# Patient Record
Sex: Male | Born: 1950 | Race: White | Hispanic: No | Marital: Married | Smoking: Never smoker
Health system: Southern US, Community
[De-identification: ages and names within clinical notes are randomized; demographics above are authoritative.]

## PROBLEM LIST (undated history)

## (undated) DIAGNOSIS — I2510 Atherosclerotic heart disease of native coronary artery without angina pectoris: Secondary | ICD-10-CM

## (undated) DIAGNOSIS — L97509 Non-pressure chronic ulcer of other part of unspecified foot with unspecified severity: Secondary | ICD-10-CM

## (undated) DIAGNOSIS — Z87898 Personal history of other specified conditions: Secondary | ICD-10-CM

## (undated) DIAGNOSIS — K219 Gastro-esophageal reflux disease without esophagitis: Secondary | ICD-10-CM

## (undated) DIAGNOSIS — E785 Hyperlipidemia, unspecified: Secondary | ICD-10-CM

## (undated) DIAGNOSIS — Z973 Presence of spectacles and contact lenses: Secondary | ICD-10-CM

## (undated) DIAGNOSIS — M5126 Other intervertebral disc displacement, lumbar region: Secondary | ICD-10-CM

## (undated) DIAGNOSIS — M47816 Spondylosis without myelopathy or radiculopathy, lumbar region: Secondary | ICD-10-CM

## (undated) DIAGNOSIS — I10 Essential (primary) hypertension: Secondary | ICD-10-CM

## (undated) DIAGNOSIS — G709 Myoneural disorder, unspecified: Secondary | ICD-10-CM

## (undated) HISTORY — PX: TONSILLECTOMY: SUR1361

## (undated) HISTORY — PX: BACK SURGERY: SHX140

## (undated) HISTORY — DX: Spondylosis without myelopathy or radiculopathy, lumbar region: M47.816

## (undated) HISTORY — DX: Hyperlipidemia, unspecified: E78.5

## (undated) HISTORY — DX: Morbid (severe) obesity due to excess calories: E66.01

## (undated) HISTORY — DX: Personal history of other specified conditions: Z87.898

## (undated) HISTORY — PX: CARDIAC CATHETERIZATION: SHX172

## (undated) HISTORY — PX: COLONOSCOPY: SHX174

## (undated) HISTORY — PX: FOOT SURGERY: SHX648

## (undated) HISTORY — PX: LUMBAR LAMINECTOMY: SHX95

## (undated) HISTORY — PX: APPENDECTOMY: SHX54

## (undated) HISTORY — DX: Atherosclerotic heart disease of native coronary artery without angina pectoris: I25.10

---

## 1997-06-19 ENCOUNTER — Ambulatory Visit (HOSPITAL_COMMUNITY): Admission: RE | Admit: 1997-06-19 | Discharge: 1997-06-19 | Payer: Self-pay | Admitting: *Deleted

## 1997-07-25 ENCOUNTER — Observation Stay (HOSPITAL_COMMUNITY): Admission: RE | Admit: 1997-07-25 | Discharge: 1997-07-26 | Payer: Self-pay | Admitting: *Deleted

## 1997-12-04 ENCOUNTER — Ambulatory Visit (HOSPITAL_BASED_OUTPATIENT_CLINIC_OR_DEPARTMENT_OTHER): Admission: RE | Admit: 1997-12-04 | Discharge: 1997-12-04 | Payer: Self-pay | Admitting: *Deleted

## 1998-01-04 ENCOUNTER — Ambulatory Visit (HOSPITAL_COMMUNITY): Admission: RE | Admit: 1998-01-04 | Discharge: 1998-01-04 | Payer: Self-pay | Admitting: Gastroenterology

## 2002-05-14 DIAGNOSIS — Z955 Presence of coronary angioplasty implant and graft: Secondary | ICD-10-CM

## 2002-05-14 HISTORY — DX: Presence of coronary angioplasty implant and graft: Z95.5

## 2002-06-06 ENCOUNTER — Encounter: Payer: Self-pay | Admitting: Psychiatry

## 2002-06-06 ENCOUNTER — Ambulatory Visit (HOSPITAL_COMMUNITY): Admission: RE | Admit: 2002-06-06 | Discharge: 2002-06-06 | Payer: Self-pay | Admitting: Psychiatry

## 2004-01-22 ENCOUNTER — Ambulatory Visit (HOSPITAL_COMMUNITY): Admission: RE | Admit: 2004-01-22 | Discharge: 2004-01-22 | Payer: Self-pay | Admitting: Orthopedic Surgery

## 2004-01-22 ENCOUNTER — Ambulatory Visit (HOSPITAL_BASED_OUTPATIENT_CLINIC_OR_DEPARTMENT_OTHER): Admission: RE | Admit: 2004-01-22 | Discharge: 2004-01-22 | Payer: Self-pay | Admitting: Orthopedic Surgery

## 2007-11-22 ENCOUNTER — Ambulatory Visit (HOSPITAL_COMMUNITY): Admission: RE | Admit: 2007-11-22 | Discharge: 2007-11-22 | Payer: Self-pay | Admitting: Neurosurgery

## 2007-12-03 ENCOUNTER — Inpatient Hospital Stay (HOSPITAL_COMMUNITY): Admission: RE | Admit: 2007-12-03 | Discharge: 2007-12-08 | Payer: Self-pay | Admitting: Neurosurgery

## 2008-02-15 ENCOUNTER — Encounter: Admission: RE | Admit: 2008-02-15 | Discharge: 2008-02-15 | Payer: Self-pay | Admitting: Neurosurgery

## 2008-03-21 ENCOUNTER — Ambulatory Visit (HOSPITAL_COMMUNITY): Admission: RE | Admit: 2008-03-21 | Discharge: 2008-03-21 | Payer: Self-pay | Admitting: Neurosurgery

## 2009-11-29 ENCOUNTER — Ambulatory Visit (HOSPITAL_COMMUNITY): Admission: RE | Admit: 2009-11-29 | Discharge: 2009-11-29 | Payer: Self-pay | Admitting: Neurosurgery

## 2009-12-21 ENCOUNTER — Inpatient Hospital Stay: Admission: RE | Admit: 2009-12-21 | Payer: Self-pay | Source: Home / Self Care | Admitting: Neurosurgery

## 2010-04-30 ENCOUNTER — Ambulatory Visit (HOSPITAL_COMMUNITY)
Admission: RE | Admit: 2010-04-30 | Discharge: 2010-04-30 | Disposition: A | Discharge status: Home / Self Care | Payer: BLUE CROSS/BLUE SHIELD | Source: Ambulatory Visit | Attending: Neurosurgery | Admitting: Neurosurgery

## 2010-04-30 ENCOUNTER — Other Ambulatory Visit (HOSPITAL_COMMUNITY): Payer: Self-pay | Admitting: Neurosurgery

## 2010-04-30 ENCOUNTER — Encounter (HOSPITAL_COMMUNITY)
Admission: RE | Admit: 2010-04-30 | Discharge: 2010-04-30 | Disposition: A | Discharge status: Home / Self Care | Payer: BLUE CROSS/BLUE SHIELD | Source: Ambulatory Visit | Attending: Neurosurgery | Admitting: Neurosurgery

## 2010-04-30 DIAGNOSIS — Z01810 Encounter for preprocedural cardiovascular examination: Secondary | ICD-10-CM | POA: Insufficient documentation

## 2010-04-30 DIAGNOSIS — IMO0002 Reserved for concepts with insufficient information to code with codable children: Secondary | ICD-10-CM | POA: Insufficient documentation

## 2010-04-30 DIAGNOSIS — M48061 Spinal stenosis, lumbar region without neurogenic claudication: Secondary | ICD-10-CM | POA: Insufficient documentation

## 2010-04-30 DIAGNOSIS — M5416 Radiculopathy, lumbar region: Secondary | ICD-10-CM

## 2010-04-30 DIAGNOSIS — Z01812 Encounter for preprocedural laboratory examination: Secondary | ICD-10-CM | POA: Insufficient documentation

## 2010-04-30 DIAGNOSIS — Z01818 Encounter for other preprocedural examination: Secondary | ICD-10-CM | POA: Insufficient documentation

## 2010-04-30 LAB — CBC
HCT: 40.2 % (ref 39.0–52.0)
Hemoglobin: 14.1 g/dL (ref 13.0–17.0)
MCH: 32.4 pg (ref 26.0–34.0)
MCHC: 35.1 g/dL (ref 30.0–36.0)
MCV: 92.4 fL (ref 78.0–100.0)
Platelets: 193 10*3/uL (ref 150–400)
RBC: 4.35 MIL/uL (ref 4.22–5.81)
RDW: 14.2 % (ref 11.5–15.5)
WBC: 6.8 10*3/uL (ref 4.0–10.5)

## 2010-04-30 LAB — SURGICAL PCR SCREEN
MRSA, PCR: NEGATIVE
Staphylococcus aureus: NEGATIVE

## 2010-04-30 LAB — BASIC METABOLIC PANEL
BUN: 9 mg/dL (ref 6–23)
CO2: 26 mEq/L (ref 19–32)
Calcium: 9.1 mg/dL (ref 8.4–10.5)
Chloride: 101 mEq/L (ref 96–112)
Creatinine, Ser: 0.86 mg/dL (ref 0.4–1.5)
GFR calc Af Amer: >60 mL/min (ref >60)
GFR calc non Af Amer: >60 mL/min (ref >60)
Glucose, Bld: 116 mg/dL — ABNORMAL HIGH (ref 70–99)
Potassium: 4 mEq/L (ref 3.5–5.1)
Sodium: 138 mEq/L (ref 135–145)

## 2010-05-08 ENCOUNTER — Inpatient Hospital Stay (HOSPITAL_COMMUNITY): Payer: BLUE CROSS/BLUE SHIELD

## 2010-05-08 ENCOUNTER — Inpatient Hospital Stay (HOSPITAL_COMMUNITY)
Admission: RE | Admit: 2010-05-08 | Discharge: 2010-05-13 | DRG: 756 | Disposition: A | Discharge status: Home / Self Care | Payer: BLUE CROSS/BLUE SHIELD | Source: Ambulatory Visit | Attending: Neurosurgery | Admitting: Neurosurgery

## 2010-05-08 DIAGNOSIS — M5137 Other intervertebral disc degeneration, lumbosacral region: Principal | ICD-10-CM | POA: Diagnosis present

## 2010-05-08 DIAGNOSIS — Z981 Arthrodesis status: Secondary | ICD-10-CM

## 2010-05-08 DIAGNOSIS — I10 Essential (primary) hypertension: Secondary | ICD-10-CM | POA: Diagnosis present

## 2010-05-08 DIAGNOSIS — M51379 Other intervertebral disc degeneration, lumbosacral region without mention of lumbar back pain or lower extremity pain: Principal | ICD-10-CM | POA: Diagnosis present

## 2010-05-08 DIAGNOSIS — E669 Obesity, unspecified: Secondary | ICD-10-CM | POA: Diagnosis present

## 2010-05-08 DIAGNOSIS — Y9210 Unspecified residential institution as the place of occurrence of the external cause: Secondary | ICD-10-CM | POA: Diagnosis not present

## 2010-05-08 DIAGNOSIS — I2510 Atherosclerotic heart disease of native coronary artery without angina pectoris: Secondary | ICD-10-CM | POA: Diagnosis present

## 2010-05-08 DIAGNOSIS — L2989 Other pruritus: Secondary | ICD-10-CM | POA: Diagnosis not present

## 2010-05-08 DIAGNOSIS — Z91040 Latex allergy status: Secondary | ICD-10-CM

## 2010-05-08 DIAGNOSIS — T40605A Adverse effect of unspecified narcotics, initial encounter: Secondary | ICD-10-CM | POA: Diagnosis not present

## 2010-05-08 DIAGNOSIS — M47817 Spondylosis without myelopathy or radiculopathy, lumbosacral region: Secondary | ICD-10-CM | POA: Diagnosis present

## 2010-05-08 DIAGNOSIS — L298 Other pruritus: Secondary | ICD-10-CM | POA: Diagnosis not present

## 2010-05-08 DIAGNOSIS — Z9861 Coronary angioplasty status: Secondary | ICD-10-CM

## 2010-05-08 LAB — TYPE AND SCREEN
ABO/RH(D): O POS
Antibody Screen: NEGATIVE

## 2010-05-13 LAB — POCT I-STAT 4, (NA,K, GLUC, HGB,HCT)
Glucose, Bld: 131 mg/dL — ABNORMAL HIGH (ref 70–99)
HCT: 28 % — ABNORMAL LOW (ref 39.0–52.0)
Hemoglobin: 9.5 g/dL — ABNORMAL LOW (ref 13.0–17.0)
Potassium: 4.5 mEq/L (ref 3.5–5.1)
Sodium: 137 mEq/L (ref 135–145)

## 2010-05-28 NOTE — Op Note (Signed)
NAME:  Stephen Medina, Stephen Medina NO.:  [id number]   MEDICAL RECORD NO.:  [id number]          PATIENT TYPE:  INP   LOCATION:  3023                         FACILITY:  [facility]   PHYSICIAN:  Coletta Memos, M.D.     DATE OF BIRTH:  [birth year]   DATE OF PROCEDURE:  12/03/2007  DATE OF DISCHARGE:                               OPERATIVE REPORT   PREOPERATIVE DIAGNOSES:  Lumbar stenosis L2-3, lumbar retrolisthesis L2  and L3.   POSTOPERATIVE DIAGNOSES:  Lumbar stenosis L2-3, lumbar retrolisthesis L2  on L3, lumbar instability L2-3.   PROCEDURE:  1. L2-3 posterior lumbar interbody arthrodesis with [employer] Opal cages      12 x 24 mm packed with morselized autograft, disk space was also      packed with morselized autograft.  2. Posterolateral arthrodesis L2-3 using morselized allograft.  3. Nonsegmental pedicle screw fixation of L2-3.   SURGEON:  Coletta Memos, MD   ASSISTANT:  Hilda Lias, MD   COMPLICATIONS:  None.   ANESTHESIA:  General endotracheal.   INDICATIONS:  [name] is a 60 year old who had severe pain going into  his right lower extremity.  MRI suggested a herniated disk on the right-  sided L3-4.  We had had injected but he showed absolutely no  improvement.  I had him undergo myelogram and post myelogram CT.  What  that showed was fairly significant stenosis at L2-3 and retrolisthesis.  What I was able appreciate intraoperatively was the fact that in a  supine position on a Jackson table he canted and angulated at that L2-3  disk space more than he had when he was in a recumbent position.  In a  prone position, he was different than supine.  I therefore felt it best  that he be fuse as I plan to do with the use of pedicle screws.  Since  he had abnormal movement, I did not believe this five plate would be  adequate in that situation.   OPERATIVE NOTE:  [name] was brought to the operating room, intubated,  placed under general anesthetic.  A Foley catheter  was placed under  sterile conditions.  He was positioned on the Scott table and all  pressure points were properly padded.  His back was prepped and he was  draped in a sterile fashion.  I opened the skin with a #10 blade, took  this down to the thoracolumbar fascia.  I exposed the lamina of L2 and  of L3 using intraoperative x-ray to confirm our location.  I then  proceeded to perform semihemilaminectomy on the right side when I  reassessed my position and reviewed his x-rays and felt that a full  decompression would best serve him.  I therefore completed a full  laminectomy of L2 and hemilaminectomy of L3.  Fully decompressed the  thecal sac in L2 and L3 nerve roots.   I performed diskectomies and prepared for the interbody arthrodesis.  I  used 12-mm PEEK cages filled with morselized autograft after  diskectomies were done and the endplates scraped and the bone prepared  to  accept graft material.  I packed around the cages with morselized  autograft also.  X-ray showed those cages to be in good position.  I  then turned my attention to the pedicle screws.   Using fluoroscopic guidance and with Dr. Cassandria Santee assistance we placed 4  pedicle screws two at L2, two at L3, 55 x 50 mm screw placed on the left  L2, 55 x 45 right L2, 65 x 50 L3 bilaterally.  AP and lateral views  showed screws to be in good position.  I then performed a posterolateral  arthrodesis.  I decorticated transverse process and pars  interarticularis of L2 and facet of L3.  I packed morselized allograft  around those decorticated bony surfaces bilaterally.  I then placed  rods, connected the caps, secured those.  These were legacy screws from  [employer].  I then irrigated.  I then closed the wound in layered  fashion with Dr. Cassandria Santee assistance, reapproximating thoracolumbar  subcutaneous and subcuticular layers.  Dermabond used for sterile  dressing.           ______________________________  Coletta Memos,  M.D.     KC/MEDQ  D:  12/04/2007  T:  12/04/2007  Job:  [id number]

## 2010-05-31 NOTE — Op Note (Signed)
NAME:  CHIRAG, KRUEGER NO.:  [id number]   MEDICAL RECORD NO.:  [id number]          PATIENT TYPE:  AMB   LOCATION:  N[facility]                         FACILITY:  [facility]   PHYSICIAN:  Georges Lynch. Gioffre, M.D.DATE OF BIRTH:  12-22-1950   DATE OF PROCEDURE:  01/22/2004  DATE OF DISCHARGE:                                 OPERATIVE REPORT   ASSISTANT:  Nurse.   PREOPERATIVE DIAGNOSES:  1.  Torn medial and lateral menisci, right knee.  2.  Degenerative arthritis, right knee.   POSTOPERATIVE DIAGNOSES:  1.  Torn medial and lateral menisci, right knee.  2.  Degenerative arthritis, right knee.   OPERATION:  1.  Diagnostic laparoscopy, right knee.  2.  Partial medial meniscectomy, right knee.  3.  Partial lateral meniscectomy, right knee.  4.  Abrasion chondroplasty of the patella and synovectomy in the      suprapatellar pouch.   PROCEDURE:  Under general anesthesia, routine orthopedic prepping and  draping of the right lower extremity was carried out.  He had 1 g of IV  Ancef preop.  At this time, a small punctate incision was made in the  suprapatellar pouch and inflow cannula was inserted and the knee was  distended with saline.  Another small punctate incision made in the anterior  lateral joint.  The arthroscope was entered and a complete diagnostic  arthroscopy is carried out.  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  I  introduced a shaver suction device through the medial approach and did a  synovectomy and abrasion chondroplasty in the suprapatellar region and the  patella.  Following this, I went down.  The anterior cruciate appeared to be  intact but it was somewhat attenuated.  In the medial meniscus, there was a  large flap tear anteriorly.  I simply shaved it out with a shaver suction  device.  The posterior horn was intact.  I then went over the lateral joint.  He had some  arthritic changes in the tibial plateau on the right.  That was  all laterally.  He had irregular tears of the periphery of the lateral  meniscus.  I simply did a partial lateral meniscectomy.  I probed the  remaining part of the meniscus; it was intact.  The suprapatellar region had  a chronic synovitis.  I did a synovectomy.  The patella was quite irregular;  so I introduced a shaver suction device and did an abrasion chondroplasty of  the patella.  I thoroughly irrigated out the area, removed all of the fluid.  Three punctate incisions were closed with 3-0 nylon suture.  I injected 20  cc of 0.5% Marcaine and epinephrine into the knee joint.  Sterile Neosporin  vinyl dressing was applied.   POSTOPERATIVE CARE:  1.  He will be on Mepergan Fortis 1 or 2 q.4h. p.r.n. for pain.  2.  He will be on aspirin 325 mg b.i.d. today and for 2 weeks as an      anticoagulant.  3.  He will be on crutches for weightbearing as tolerated.  4.  I will see him in the office in 2 weeks or prior to that if there is a      problem.      RAG/MEDQ  D:  01/22/2004  T:  01/22/2004  Job:  [id number]
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
## 2010-09-27 ENCOUNTER — Other Ambulatory Visit (HOSPITAL_COMMUNITY): Payer: Self-pay | Admitting: Family Medicine

## 2010-09-27 DIAGNOSIS — N63 Unspecified lump in unspecified breast: Secondary | ICD-10-CM

## 2010-10-15 LAB — BASIC METABOLIC PANEL
BUN: 9
CO2: 21
Calcium: 9
Chloride: 109
Creatinine, Ser: 0.84
GFR calc Af Amer: >60
GFR calc non Af Amer: >60
Glucose, Bld: 91
Potassium: 3.4 — ABNORMAL LOW
Sodium: 142

## 2010-10-15 LAB — CARDIAC PANEL(CRET KIN+CKTOT+MB+TROPI)
CK, MB: 2.2
CK, MB: 2.6
CK, MB: 4.7 — ABNORMAL HIGH
Relative Index: 0.4
Relative Index: 0.4
Relative Index: 0.7
Total CK: 536 — ABNORMAL HIGH
Total CK: 582 — ABNORMAL HIGH
Total CK: 695 — ABNORMAL HIGH
Troponin I: 0.01
Troponin I: 0.01
Troponin I: <0.01

## 2010-10-15 LAB — CBC
HCT: 37.5 — ABNORMAL LOW
Hemoglobin: 13
MCHC: 34.7
MCV: 95
Platelets: 219
RBC: 3.95 — ABNORMAL LOW
RDW: 15.1
WBC: 6.9

## 2010-10-15 LAB — TYPE AND SCREEN
ABO/RH(D): O POS
Antibody Screen: NEGATIVE

## 2010-10-15 LAB — APTT: aPTT: 21 — ABNORMAL LOW

## 2010-10-15 LAB — PROTIME-INR
INR: 0.9
Prothrombin Time: 12.2

## 2010-10-15 LAB — ABO/RH: ABO/RH(D): O POS

## 2010-10-16 ENCOUNTER — Ambulatory Visit (HOSPITAL_COMMUNITY)
Admission: RE | Admit: 2010-10-16 | Discharge: 2010-10-16 | Disposition: A | Discharge status: Home / Self Care | Payer: BC Managed Care – PPO | Source: Ambulatory Visit | Attending: Family Medicine | Admitting: Family Medicine

## 2010-10-16 DIAGNOSIS — N644 Mastodynia: Secondary | ICD-10-CM | POA: Insufficient documentation

## 2010-10-16 DIAGNOSIS — N63 Unspecified lump in unspecified breast: Secondary | ICD-10-CM

## 2011-11-18 ENCOUNTER — Other Ambulatory Visit: Payer: Self-pay | Admitting: Cardiology

## 2011-11-18 ENCOUNTER — Ambulatory Visit
Admission: RE | Admit: 2011-11-18 | Discharge: 2011-11-18 | Disposition: A | Discharge status: Home / Self Care | Payer: BC Managed Care – PPO | Source: Ambulatory Visit | Attending: Cardiology | Admitting: Cardiology

## 2011-11-18 DIAGNOSIS — R0789 Other chest pain: Secondary | ICD-10-CM

## 2013-01-18 DIAGNOSIS — M47817 Spondylosis without myelopathy or radiculopathy, lumbosacral region: Secondary | ICD-10-CM | POA: Diagnosis present

## 2013-01-18 HISTORY — DX: Spondylosis without myelopathy or radiculopathy, lumbosacral region: M47.817

## 2013-01-19 ENCOUNTER — Other Ambulatory Visit (HOSPITAL_COMMUNITY): Payer: Self-pay | Admitting: Neurosurgery

## 2013-01-19 ENCOUNTER — Other Ambulatory Visit: Payer: Self-pay | Admitting: Neurosurgery

## 2013-01-19 DIAGNOSIS — M4306 Spondylolysis, lumbar region: Secondary | ICD-10-CM

## 2013-01-27 ENCOUNTER — Ambulatory Visit (HOSPITAL_COMMUNITY): Admission: RE | Admit: 2013-01-27 | Payer: BC Managed Care – PPO | Source: Ambulatory Visit

## 2013-01-27 ENCOUNTER — Ambulatory Visit (HOSPITAL_COMMUNITY): Payer: BC Managed Care – PPO

## 2013-02-02 ENCOUNTER — Encounter (HOSPITAL_COMMUNITY): Payer: Self-pay | Admitting: Pharmacy Technician

## 2013-02-08 ENCOUNTER — Ambulatory Visit (HOSPITAL_COMMUNITY)
Admission: RE | Admit: 2013-02-08 | Discharge: 2013-02-08 | Disposition: A | Discharge status: Home / Self Care | Payer: BC Managed Care – PPO | Source: Ambulatory Visit | Attending: Neurosurgery | Admitting: Neurosurgery

## 2013-02-08 VITALS — BP 109/59 | HR 63 | Temp 97.7°F | Resp 16 | Ht 73.0 in | Wt 310.0 lb

## 2013-02-08 DIAGNOSIS — M47817 Spondylosis without myelopathy or radiculopathy, lumbosacral region: Secondary | ICD-10-CM | POA: Insufficient documentation

## 2013-02-08 DIAGNOSIS — M4306 Spondylolysis, lumbar region: Secondary | ICD-10-CM

## 2013-02-08 DIAGNOSIS — Z981 Arthrodesis status: Secondary | ICD-10-CM | POA: Insufficient documentation

## 2013-02-08 DIAGNOSIS — R109 Unspecified abdominal pain: Secondary | ICD-10-CM | POA: Insufficient documentation

## 2013-02-08 MED ORDER — DIAZEPAM 5 MG PO TABS
ORAL_TABLET | ORAL | Status: AC
  Filled 2013-02-08: qty 2

## 2013-02-08 MED ORDER — IOHEXOL 180 MG/ML  SOLN
20.0000 mL | Freq: Once | INTRAMUSCULAR | Status: AC | PRN
  Administered 2013-02-08: 20 mL via INTRATHECAL

## 2013-02-08 MED ORDER — OXYCODONE HCL 5 MG PO TABS
ORAL_TABLET | ORAL | Status: AC
  Administered 2013-02-08: 5 mg via ORAL
  Filled 2013-02-08: qty 2

## 2013-02-08 MED ORDER — ONDANSETRON HCL 4 MG/2ML IJ SOLN: 4.0000 mg | Freq: Four times a day (QID) | INTRAMUSCULAR | Status: DC | PRN

## 2013-02-08 MED ORDER — OXYCODONE HCL 5 MG PO TABS
5.0000 mg | ORAL_TABLET | ORAL | Status: DC | PRN
  Administered 2013-02-08: 10 mg via ORAL
  Administered 2013-02-08: 5 mg via ORAL
  Filled 2013-02-08: qty 1

## 2013-02-08 MED ORDER — DIAZEPAM 5 MG PO TABS
10.0000 mg | ORAL_TABLET | Freq: Once | ORAL | Status: AC
  Administered 2013-02-08: 10 mg via ORAL

## 2013-02-08 NOTE — Discharge Instructions (Signed)
Myelography °Care After °These instructions give you information on caring for yourself after your procedure. Your doctor may also give you specific instructions. Call your doctor if you have any problems or questions after your procedure. °HOME CARE °· Rest often the first day. °· When you rest, lie flat, with your head slightly raised (elevated). °· Avoid heavy lifting and activity for 48 hours. °· You may take the bandage (dressing) off 1 day after the test. °GET HELP RIGHT AWAY IF:  °· You have a very bad headache. °· You have a fever. °MAKE SURE YOU: °· Understand these instructions. °· Will watch your condition. °· Will get help right away if you are not doing well or get worse. °Document Released: 10/09/2007 Document Revised: 12/17/2011 Document Reviewed: 09/24/2011 °ExitCare® Patient Information ©2014 [employer]. ° °

## 2013-02-08 NOTE — Op Note (Signed)
*   No surgery found * Lumbar Myelogram  PATIENT:  Stephen Medina is a 63 y.o. male with low back and lower extremity pain  PRE-OPERATIVE DIAGNOSIS:  Lumbago, lumbar radiculopathy  POST-OPERATIVE DIAGNOSIS:  same  PROCEDURE:  Lumbar Myelogram  SURGEON:  Surie Suchocki  ANESTHESIA:   local LOCAL MEDICATIONS USED:  LIDOCAINE  and Amount: 15 ml Procedure Note: Stephen Medina was brought to the myelogram suite. His back was prepped in a sterile fashion. I infiltrated lidocaine into the entry site. I placed a needle into the thecal sac, then infiltrated 20 cc 180 omnipaque into the thecal sac. I confirmed my injection with fluoroscopy. This was done at L2/3.Stephen Medina tolerated the procedure well.    PATIENT DISPOSITION:  Short Stay

## 2013-03-17 DIAGNOSIS — M48061 Spinal stenosis, lumbar region without neurogenic claudication: Secondary | ICD-10-CM | POA: Insufficient documentation

## 2013-03-17 HISTORY — DX: Spinal stenosis, lumbar region without neurogenic claudication: M48.061

## 2013-03-22 ENCOUNTER — Other Ambulatory Visit: Payer: Self-pay | Admitting: Neurosurgery

## 2013-03-23 ENCOUNTER — Encounter (HOSPITAL_COMMUNITY): Payer: Self-pay | Admitting: Pharmacy Technician

## 2013-03-25 ENCOUNTER — Encounter: Payer: Self-pay | Admitting: Cardiology
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
## 2013-03-26 NOTE — Pre-Procedure Instructions (Signed)
Stephen Medina  03/26/2013   Your procedure is scheduled on:  March 20  Report to [facility] Entrance "A" [address] at [employer] AM.  Call this number if you have problems the morning of surgery: [phone]   Remember:   Do not eat food or drink liquids after midnight.   Take these medicines the morning of surgery with A SIP OF WATER: Metoprolol   STOP Aspirin and Plavix today   STOP/ Do not take Aspirin, Aleve, Naproxen, Advil, Ibuprofen, Vitamin, Herbs, or Supplements starting today   Do not wear jewelry.  Do not wear lotions, powders, or cologne. You may not wear deodorant.  Do not shave 48 hours prior to surgery. Men may shave face and neck.  Do not bring valuables to the hospital.  [facility] is not responsible for any belongings or valuables.               Contacts, dentures or bridgework may not be worn into surgery.  Leave suitcase in the car. After surgery it may be brought to your room.  For patients admitted to the hospital, discharge time is determined by your treatment team.               Special Instructions: See [facility] Preparing For Surgery   Please read over the following fact sheets that you were given: Pain Booklet, Coughing and Deep Breathing, Blood Transfusion Information and Surgical Site Infection Prevention

## 2013-03-26 NOTE — Pre-Procedure Instructions (Signed)
Stephen Medina - Preparing for Surgery  Before surgery, you can play an important role.  Because skin is not sterile, your skin needs to be as free of germs as possible.  You can reduce the number of germs on you skin by washing with CHG (chlorahexidine gluconate) soap before surgery.  CHG is an antiseptic cleaner which kills germs and bonds with the skin to continue killing germs even after washing.  Please DO NOT use if you have an allergy to CHG or antibacterial soaps.  If your skin becomes reddened/irritated stop using the CHG and inform your nurse when you arrive at Short Stay.  Do not shave (including legs and underarms) for at least 48 hours prior to the first CHG shower.  You may shave your face.  Please follow these instructions carefully:   1.  Shower with CHG Soap the night before surgery and the morning of Surgery.  2.  If you choose to wash your hair, wash your hair first as usual with your normal shampoo.  3.  After you shampoo, rinse your hair and body thoroughly to remove the shampoo.  4.  Use CHG as you would any other liquid soap.  You can apply CHG directly to the skin and wash gently with scrungie or a clean washcloth.  5.  Apply the CHG Soap to your body ONLY FROM THE NECK DOWN.  Do not use on open wounds or open sores.  Avoid contact with your eyes, ears, mouth and genitals (private parts).  Wash genitals (private parts) with your normal soap.  6.  Wash thoroughly, paying special attention to the area where your surgery will be performed.  7.  Thoroughly rinse your body with warm water from the neck down.  8.  DO NOT shower/wash with your normal soap after using and rinsing off the CHG Soap.  9.  Pat yourself dry with a clean towel.            10.  Wear clean pajamas.            11.  Place clean sheets on your bed the night of your first shower and do not sleep with pets.  Day of Surgery  Do not apply any lotions the morning of surgery.  Please wear clean clothes to the  hospital/surgery center.   

## 2013-03-28 ENCOUNTER — Encounter (HOSPITAL_COMMUNITY)
Admission: RE | Admit: 2013-03-28 | Discharge: 2013-03-28 | Disposition: A | Discharge status: Home / Self Care | Payer: Medicare Other | Source: Ambulatory Visit | Attending: Neurosurgery | Admitting: Neurosurgery

## 2013-03-28 ENCOUNTER — Ambulatory Visit (HOSPITAL_COMMUNITY)
Admission: RE | Admit: 2013-03-28 | Discharge: 2013-03-28 | Disposition: A | Discharge status: Home / Self Care | Payer: Medicare Other | Source: Ambulatory Visit | Attending: Neurosurgery | Admitting: Neurosurgery

## 2013-03-28 ENCOUNTER — Encounter (HOSPITAL_COMMUNITY): Payer: Self-pay

## 2013-03-28 DIAGNOSIS — Z01810 Encounter for preprocedural cardiovascular examination: Secondary | ICD-10-CM | POA: Insufficient documentation

## 2013-03-28 DIAGNOSIS — Z01818 Encounter for other preprocedural examination: Secondary | ICD-10-CM | POA: Insufficient documentation

## 2013-03-28 DIAGNOSIS — Z01812 Encounter for preprocedural laboratory examination: Secondary | ICD-10-CM | POA: Insufficient documentation

## 2013-03-28 HISTORY — DX: Essential (primary) hypertension: I10

## 2013-03-28 LAB — BASIC METABOLIC PANEL
BUN: 11 mg/dL (ref 6–23)
CO2: 25 mEq/L (ref 19–32)
Calcium: 9.3 mg/dL (ref 8.4–10.5)
Chloride: 99 mEq/L (ref 96–112)
Creatinine, Ser: 0.77 mg/dL (ref 0.50–1.35)
GFR calc Af Amer: >90 mL/min (ref >90)
GFR calc non Af Amer: >90 mL/min (ref >90)
Glucose, Bld: 108 mg/dL — ABNORMAL HIGH (ref 70–99)
Potassium: 4.1 mEq/L (ref 3.7–5.3)
Sodium: 140 mEq/L (ref 137–147)

## 2013-03-28 LAB — CBC
HCT: 41.1 % (ref 39.0–52.0)
Hemoglobin: 14.2 g/dL (ref 13.0–17.0)
MCH: 31.8 pg (ref 26.0–34.0)
MCHC: 34.5 g/dL (ref 30.0–36.0)
MCV: 91.9 fL (ref 78.0–100.0)
Platelets: 225 10*3/uL (ref 150–400)
RBC: 4.47 MIL/uL (ref 4.22–5.81)
RDW: 14.4 % (ref 11.5–15.5)
WBC: 5.7 10*3/uL (ref 4.0–10.5)

## 2013-03-28 LAB — TYPE AND SCREEN
ABO/RH(D): O POS
Antibody Screen: NEGATIVE

## 2013-03-28 LAB — SURGICAL PCR SCREEN
MRSA, PCR: NEGATIVE
Staphylococcus aureus: POSITIVE — AB

## 2013-03-31 MED ORDER — DEXTROSE 5 % IV SOLN
3.0000 g | INTRAVENOUS | Status: AC
  Administered 2013-04-01: 3 g via INTRAVENOUS
  Administered 2013-04-01: 2 g via INTRAVENOUS
  Filled 2013-03-31: qty 3000

## 2013-04-01 ENCOUNTER — Inpatient Hospital Stay (HOSPITAL_COMMUNITY): Payer: Medicare Other | Admitting: Certified Registered Nurse Anesthetist

## 2013-04-01 ENCOUNTER — Encounter (HOSPITAL_COMMUNITY): Payer: Medicare Other | Admitting: Certified Registered Nurse Anesthetist

## 2013-04-01 ENCOUNTER — Encounter (HOSPITAL_COMMUNITY): Payer: Self-pay | Admitting: *Deleted

## 2013-04-01 ENCOUNTER — Encounter (HOSPITAL_COMMUNITY)
Admission: RE | Disposition: A | Discharge status: Home / Self Care | Payer: Self-pay | Source: Ambulatory Visit | Attending: Neurosurgery

## 2013-04-01 ENCOUNTER — Inpatient Hospital Stay (HOSPITAL_COMMUNITY): Payer: Medicare Other

## 2013-04-01 ENCOUNTER — Inpatient Hospital Stay (HOSPITAL_COMMUNITY)
Admission: RE | Admit: 2013-04-01 | Discharge: 2013-04-03 | DRG: 460 | Disposition: A | Discharge status: Home / Self Care | Payer: Medicare Other | Source: Ambulatory Visit | Attending: Neurosurgery | Admitting: Neurosurgery

## 2013-04-01 DIAGNOSIS — M47817 Spondylosis without myelopathy or radiculopathy, lumbosacral region: Principal | ICD-10-CM | POA: Diagnosis present

## 2013-04-01 DIAGNOSIS — Z472 Encounter for removal of internal fixation device: Secondary | ICD-10-CM

## 2013-04-01 DIAGNOSIS — M47816 Spondylosis without myelopathy or radiculopathy, lumbar region: Secondary | ICD-10-CM

## 2013-04-01 DIAGNOSIS — I10 Essential (primary) hypertension: Secondary | ICD-10-CM | POA: Diagnosis present

## 2013-04-01 DIAGNOSIS — Z981 Arthrodesis status: Secondary | ICD-10-CM

## 2013-04-01 DIAGNOSIS — Z7902 Long term (current) use of antithrombotics/antiplatelets: Secondary | ICD-10-CM

## 2013-04-01 DIAGNOSIS — Z96659 Presence of unspecified artificial knee joint: Secondary | ICD-10-CM

## 2013-04-01 DIAGNOSIS — Z79899 Other long term (current) drug therapy: Secondary | ICD-10-CM

## 2013-04-01 DIAGNOSIS — Z7982 Long term (current) use of aspirin: Secondary | ICD-10-CM

## 2013-04-01 HISTORY — DX: Spondylosis without myelopathy or radiculopathy, lumbar region: M47.816

## 2013-04-01 SURGERY — POSTERIOR LUMBAR FUSION 1 LEVEL
Anesthesia: General | Site: Back

## 2013-04-01 MED ORDER — LIDOCAINE-EPINEPHRINE 0.5 %-1:200000 IJ SOLN
INTRAMUSCULAR | Status: DC | PRN
  Administered 2013-04-01: 10 mL

## 2013-04-01 MED ORDER — ALBUMIN HUMAN 5 % IV SOLN
INTRAVENOUS | Status: DC | PRN
  Administered 2013-04-01: 11:00:00 via INTRAVENOUS

## 2013-04-01 MED ORDER — HYDROMORPHONE 0.3 MG/ML IV SOLN
INTRAVENOUS | Status: DC
  Administered 2013-04-01: 18:00:00 via INTRAVENOUS
  Filled 2013-04-01: qty 25

## 2013-04-01 MED ORDER — ROCURONIUM BROMIDE 50 MG/5ML IV SOLN
INTRAVENOUS | Status: AC
  Filled 2013-04-01: qty 1

## 2013-04-01 MED ORDER — SODIUM CHLORIDE 0.9 % IJ SOLN: 3.0000 mL | INTRAMUSCULAR | Status: DC | PRN

## 2013-04-01 MED ORDER — POLYETHYLENE GLYCOL 3350 17 G PO PACK
17.0000 g | PACK | Freq: Every day | ORAL | Status: DC | PRN
  Filled 2013-04-01: qty 1

## 2013-04-01 MED ORDER — HYDROMORPHONE 0.3 MG/ML IV SOLN
INTRAVENOUS | Status: AC
  Filled 2013-04-01: qty 25

## 2013-04-01 MED ORDER — ARTIFICIAL TEARS OP OINT
TOPICAL_OINTMENT | OPHTHALMIC | Status: DC | PRN
  Administered 2013-04-01: 1 via OPHTHALMIC

## 2013-04-01 MED ORDER — SODIUM CHLORIDE 0.9 % IV SOLN: 250.0000 mL | INTRAVENOUS | Status: DC

## 2013-04-01 MED ORDER — SENNA 8.6 MG PO TABS
1.0000 | ORAL_TABLET | Freq: Two times a day (BID) | ORAL | Status: DC
  Administered 2013-04-01 – 2013-04-03 (×4): 8.6 mg via ORAL
  Filled 2013-04-01 (×6): qty 1

## 2013-04-01 MED ORDER — OXYCODONE HCL 5 MG/5ML PO SOLN: 5.0000 mg | Freq: Once | ORAL | Status: DC | PRN

## 2013-04-01 MED ORDER — MEPERIDINE HCL 25 MG/ML IJ SOLN: 6.2500 mg | INTRAMUSCULAR | Status: DC | PRN

## 2013-04-01 MED ORDER — FENTANYL CITRATE 0.05 MG/ML IJ SOLN
INTRAMUSCULAR | Status: AC
  Filled 2013-04-01: qty 5

## 2013-04-01 MED ORDER — SODIUM CHLORIDE 0.9 % IJ SOLN: 9.0000 mL | INTRAMUSCULAR | Status: DC | PRN

## 2013-04-01 MED ORDER — LACTATED RINGERS IV SOLN
INTRAVENOUS | Status: DC
  Administered 2013-04-01: 08:00:00 via INTRAVENOUS

## 2013-04-01 MED ORDER — MIDAZOLAM HCL 5 MG/5ML IJ SOLN
INTRAMUSCULAR | Status: DC | PRN
  Administered 2013-04-01: 2 mg via INTRAVENOUS

## 2013-04-01 MED ORDER — PROPOFOL 10 MG/ML IV BOLUS
INTRAVENOUS | Status: DC | PRN
  Administered 2013-04-01: 200 mg via INTRAVENOUS

## 2013-04-01 MED ORDER — ONDANSETRON HCL 4 MG/2ML IJ SOLN: 4.0000 mg | Freq: Four times a day (QID) | INTRAMUSCULAR | Status: DC | PRN

## 2013-04-01 MED ORDER — SODIUM CHLORIDE 0.9 % IJ SOLN
3.0000 mL | Freq: Two times a day (BID) | INTRAMUSCULAR | Status: DC
  Administered 2013-04-02 – 2013-04-03 (×2): 3 mL via INTRAVENOUS

## 2013-04-01 MED ORDER — DIAZEPAM 5 MG PO TABS
5.0000 mg | ORAL_TABLET | Freq: Four times a day (QID) | ORAL | Status: DC | PRN
  Administered 2013-04-02: 5 mg via ORAL
  Filled 2013-04-01: qty 1

## 2013-04-01 MED ORDER — POTASSIUM CHLORIDE IN NACL 20-0.9 MEQ/L-% IV SOLN
INTRAVENOUS | Status: DC
  Filled 2013-04-01 (×3): qty 1000

## 2013-04-01 MED ORDER — MENTHOL 3 MG MT LOZG: 1.0000 | LOZENGE | OROMUCOSAL | Status: DC | PRN

## 2013-04-01 MED ORDER — QUINAPRIL-HYDROCHLOROTHIAZIDE 20-12.5 MG PO TABS: 1.0000 | ORAL_TABLET | Freq: Every day | ORAL | Status: DC

## 2013-04-01 MED ORDER — HYDROMORPHONE 0.3 MG/ML IV SOLN
INTRAVENOUS | Status: DC
  Administered 2013-04-01: 0.3 mg via INTRAVENOUS
  Administered 2013-04-02: 0.6 mg via INTRAVENOUS
  Administered 2013-04-02: 0.3 mg via INTRAVENOUS
  Administered 2013-04-02: 0.6 mg via INTRAVENOUS

## 2013-04-01 MED ORDER — BUPIVACAINE HCL 0.5 % IJ SOLN
INTRAMUSCULAR | Status: DC | PRN
  Administered 2013-04-01: 20 mL

## 2013-04-01 MED ORDER — LACTATED RINGERS IV SOLN
INTRAVENOUS | Status: DC | PRN
  Administered 2013-04-01 (×5): via INTRAVENOUS

## 2013-04-01 MED ORDER — PROPOFOL 10 MG/ML IV BOLUS
INTRAVENOUS | Status: AC
  Filled 2013-04-01: qty 20

## 2013-04-01 MED ORDER — GLYCOPYRROLATE 0.2 MG/ML IJ SOLN
INTRAMUSCULAR | Status: AC
  Filled 2013-04-01: qty 4

## 2013-04-01 MED ORDER — LIDOCAINE HCL (CARDIAC) 20 MG/ML IV SOLN
INTRAVENOUS | Status: AC
  Filled 2013-04-01: qty 5

## 2013-04-01 MED ORDER — CEFAZOLIN SODIUM-DEXTROSE 2-3 GM-% IV SOLR
2.0000 g | Freq: Three times a day (TID) | INTRAVENOUS | Status: AC
  Administered 2013-04-01 – 2013-04-02 (×2): 2 g via INTRAVENOUS
  Filled 2013-04-01 (×2): qty 50

## 2013-04-01 MED ORDER — GLYCOPYRROLATE 0.2 MG/ML IJ SOLN
INTRAMUSCULAR | Status: DC | PRN
  Administered 2013-04-01: 0.4 mg via INTRAVENOUS

## 2013-04-01 MED ORDER — PROMETHAZINE HCL 25 MG/ML IJ SOLN
6.2500 mg | INTRAMUSCULAR | Status: DC | PRN
  Administered 2013-04-01: 6.25 mg via INTRAVENOUS

## 2013-04-01 MED ORDER — DIPHENHYDRAMINE HCL 50 MG/ML IJ SOLN: 12.5000 mg | Freq: Four times a day (QID) | INTRAMUSCULAR | Status: DC | PRN

## 2013-04-01 MED ORDER — NALOXONE HCL 0.4 MG/ML IJ SOLN: 0.4000 mg | INTRAMUSCULAR | Status: DC | PRN

## 2013-04-01 MED ORDER — ACETAMINOPHEN 650 MG RE SUPP: 650.0000 mg | RECTAL | Status: DC | PRN

## 2013-04-01 MED ORDER — LISINOPRIL 20 MG PO TABS
20.0000 mg | ORAL_TABLET | Freq: Every day | ORAL | Status: DC
Start: 2013-04-01 — End: 2013-04-03
  Administered 2013-04-01 – 2013-04-02 (×2): 20 mg via ORAL
  Filled 2013-04-01 (×3): qty 1

## 2013-04-01 MED ORDER — BACITRACIN ZINC 500 UNIT/GM EX OINT
TOPICAL_OINTMENT | CUTANEOUS | Status: DC | PRN
  Administered 2013-04-01: 1 via TOPICAL

## 2013-04-01 MED ORDER — MIDAZOLAM HCL 2 MG/2ML IJ SOLN
INTRAMUSCULAR | Status: AC
  Filled 2013-04-01: qty 2

## 2013-04-01 MED ORDER — ACETAMINOPHEN 325 MG PO TABS: 650.0000 mg | ORAL_TABLET | ORAL | Status: DC | PRN

## 2013-04-01 MED ORDER — ROCURONIUM BROMIDE 100 MG/10ML IV SOLN
INTRAVENOUS | Status: DC | PRN
  Administered 2013-04-01: 10 mg via INTRAVENOUS
  Administered 2013-04-01: 40 mg via INTRAVENOUS

## 2013-04-01 MED ORDER — METOPROLOL TARTRATE 25 MG PO TABS
25.0000 mg | ORAL_TABLET | Freq: Every day | ORAL | Status: DC
  Administered 2013-04-02 – 2013-04-03 (×2): 25 mg via ORAL
  Filled 2013-04-01 (×2): qty 1

## 2013-04-01 MED ORDER — LIDOCAINE HCL (CARDIAC) 20 MG/ML IV SOLN
INTRAVENOUS | Status: DC | PRN
  Administered 2013-04-01: 30 mg via INTRAVENOUS

## 2013-04-01 MED ORDER — OXYCODONE-ACETAMINOPHEN 5-325 MG PO TABS
1.0000 | ORAL_TABLET | ORAL | Status: DC | PRN
  Administered 2013-04-02: 2 via ORAL
  Filled 2013-04-01: qty 2

## 2013-04-01 MED ORDER — KETOROLAC TROMETHAMINE 30 MG/ML IJ SOLN
INTRAMUSCULAR | Status: AC
  Filled 2013-04-01: qty 1

## 2013-04-01 MED ORDER — HYDROMORPHONE HCL PF 1 MG/ML IJ SOLN
INTRAMUSCULAR | Status: AC
  Filled 2013-04-01: qty 1

## 2013-04-01 MED ORDER — MIDAZOLAM HCL 2 MG/2ML IJ SOLN: 0.5000 mg | Freq: Once | INTRAMUSCULAR | Status: DC | PRN

## 2013-04-01 MED ORDER — FENTANYL CITRATE 0.05 MG/ML IJ SOLN
INTRAMUSCULAR | Status: DC | PRN
  Administered 2013-04-01 (×3): 50 ug via INTRAVENOUS
  Administered 2013-04-01: 25 ug via INTRAVENOUS
  Administered 2013-04-01: 50 ug via INTRAVENOUS
  Administered 2013-04-01: 25 ug via INTRAVENOUS
  Administered 2013-04-01: 50 ug via INTRAVENOUS
  Administered 2013-04-01: 250 ug via INTRAVENOUS
  Administered 2013-04-01: 50 ug via INTRAVENOUS

## 2013-04-01 MED ORDER — PROMETHAZINE HCL 25 MG/ML IJ SOLN
INTRAMUSCULAR | Status: AC
  Filled 2013-04-01: qty 1

## 2013-04-01 MED ORDER — DIPHENHYDRAMINE HCL 12.5 MG/5ML PO ELIX: 12.5000 mg | ORAL_SOLUTION | Freq: Four times a day (QID) | ORAL | Status: DC | PRN

## 2013-04-01 MED ORDER — HYDROMORPHONE HCL PF 1 MG/ML IJ SOLN
0.2500 mg | INTRAMUSCULAR | Status: DC | PRN
  Administered 2013-04-01 (×2): 0.5 mg via INTRAVENOUS

## 2013-04-01 MED ORDER — ONDANSETRON HCL 4 MG/2ML IJ SOLN
4.0000 mg | INTRAMUSCULAR | Status: DC | PRN
  Administered 2013-04-03: 4 mg via INTRAVENOUS
  Filled 2013-04-01: qty 2

## 2013-04-01 MED ORDER — ATORVASTATIN CALCIUM 80 MG PO TABS
80.0000 mg | ORAL_TABLET | Freq: Every day | ORAL | Status: DC
  Administered 2013-04-02: 80 mg via ORAL
  Filled 2013-04-01 (×2): qty 1

## 2013-04-01 MED ORDER — THROMBIN 20000 UNITS EX SOLR
CUTANEOUS | Status: DC | PRN
  Administered 2013-04-01: 11:00:00 via TOPICAL

## 2013-04-01 MED ORDER — DIPHENHYDRAMINE HCL 12.5 MG/5ML PO ELIX
12.5000 mg | ORAL_SOLUTION | Freq: Four times a day (QID) | ORAL | Status: DC | PRN
Start: 2013-04-01 — End: 2013-04-02

## 2013-04-01 MED ORDER — HYDROMORPHONE HCL PF 1 MG/ML IJ SOLN
0.5000 mg | INTRAMUSCULAR | Status: DC | PRN
  Administered 2013-04-02: 1 mg via INTRAVENOUS
  Filled 2013-04-01: qty 1

## 2013-04-01 MED ORDER — HYDROCHLOROTHIAZIDE 12.5 MG PO CAPS
12.5000 mg | ORAL_CAPSULE | Freq: Every day | ORAL | Status: DC
  Administered 2013-04-02 – 2013-04-03 (×2): 12.5 mg via ORAL
  Filled 2013-04-01 (×3): qty 1

## 2013-04-01 MED ORDER — NEOSTIGMINE METHYLSULFATE 1 MG/ML IJ SOLN
INTRAMUSCULAR | Status: DC | PRN
  Administered 2013-04-01: 3 mg via INTRAVENOUS

## 2013-04-01 MED ORDER — SUCCINYLCHOLINE CHLORIDE 20 MG/ML IJ SOLN
INTRAMUSCULAR | Status: DC | PRN
  Administered 2013-04-01: 100 mg via INTRAVENOUS

## 2013-04-01 MED ORDER — 0.9 % SODIUM CHLORIDE (POUR BTL) OPTIME
TOPICAL | Status: DC | PRN
  Administered 2013-04-01: 1000 mL

## 2013-04-01 MED ORDER — HYDROCODONE-ACETAMINOPHEN 5-325 MG PO TABS: 1.0000 | ORAL_TABLET | ORAL | Status: DC | PRN

## 2013-04-01 MED ORDER — KETOROLAC TROMETHAMINE 30 MG/ML IJ SOLN
INTRAMUSCULAR | Status: DC | PRN
  Administered 2013-04-01: 30 mg via INTRAVENOUS

## 2013-04-01 MED ORDER — OXYCODONE HCL 5 MG PO TABS: 5.0000 mg | ORAL_TABLET | Freq: Once | ORAL | Status: DC | PRN

## 2013-04-01 MED ORDER — ONDANSETRON HCL 4 MG/2ML IJ SOLN
INTRAMUSCULAR | Status: AC
  Filled 2013-04-01: qty 2

## 2013-04-01 MED ORDER — ONDANSETRON HCL 4 MG/2ML IJ SOLN
INTRAMUSCULAR | Status: DC | PRN
  Administered 2013-04-01: 4 mg via INTRAVENOUS

## 2013-04-01 MED ORDER — PHENOL 1.4 % MT LIQD: 1.0000 | OROMUCOSAL | Status: DC | PRN

## 2013-04-01 SURGICAL SUPPLY — 72 items
BENZOIN TINCTURE PRP APPL 2/3 (GAUZE/BANDAGES/DRESSINGS) IMPLANT
BLADE SURG ROTATE 9660 (MISCELLANEOUS) IMPLANT
BUR MATCHSTICK NEURO 3.0 LAGG (BURR) ×2 IMPLANT
CANISTER SUCT 3000ML (MISCELLANEOUS) ×2 IMPLANT
CONT SPEC 4OZ CLIKSEAL STRL BL (MISCELLANEOUS) ×2 IMPLANT
COVER BACK TABLE 24X17X13 BIG (DRAPES) IMPLANT
DECANTER SPIKE VIAL GLASS SM (MISCELLANEOUS) ×2 IMPLANT
DERMABOND ADHESIVE PROPEN (GAUZE/BANDAGES/DRESSINGS) ×1
DERMABOND ADVANCED (GAUZE/BANDAGES/DRESSINGS) ×1
DERMABOND ADVANCED .7 DNX12 (GAUZE/BANDAGES/DRESSINGS) ×1 IMPLANT
DERMABOND ADVANCED .7 DNX6 (GAUZE/BANDAGES/DRESSINGS) ×1 IMPLANT
DRAPE C-ARM 42X72 X-RAY (DRAPES) ×4 IMPLANT
DRAPE LAPAROTOMY 100X72X124 (DRAPES) ×2 IMPLANT
DRAPE POUCH INSTRU U-SHP 10X18 (DRAPES) ×2 IMPLANT
DRAPE SURG 17X23 STRL (DRAPES) ×2 IMPLANT
DRESSING TELFA 8X3 (GAUZE/BANDAGES/DRESSINGS) IMPLANT
DRSG OPSITE POSTOP 4X8 (GAUZE/BANDAGES/DRESSINGS) ×2 IMPLANT
DURAPREP 26ML APPLICATOR (WOUND CARE) ×2 IMPLANT
ELECT BLADE 4.0 EZ CLEAN MEGAD (MISCELLANEOUS) ×2
ELECT REM PT RETURN 9FT ADLT (ELECTROSURGICAL) ×2
ELECTRODE BLDE 4.0 EZ CLN MEGD (MISCELLANEOUS) ×1 IMPLANT
ELECTRODE REM PT RTRN 9FT ADLT (ELECTROSURGICAL) ×1 IMPLANT
GAUZE SPONGE 4X4 16PLY XRAY LF (GAUZE/BANDAGES/DRESSINGS) IMPLANT
GLOVE BIOGEL PI IND STRL 7.0 (GLOVE) ×1 IMPLANT
GLOVE BIOGEL PI IND STRL 7.5 (GLOVE) ×1 IMPLANT
GLOVE BIOGEL PI IND STRL 8 (GLOVE) ×1 IMPLANT
GLOVE BIOGEL PI INDICATOR 7.0 (GLOVE) ×1
GLOVE BIOGEL PI INDICATOR 7.5 (GLOVE) ×1
GLOVE BIOGEL PI INDICATOR 8 (GLOVE) ×1
GLOVE ECLIPSE 6.5 STRL STRAW (GLOVE) ×4 IMPLANT
GLOVE ECLIPSE 7.5 STRL STRAW (GLOVE) ×6 IMPLANT
GLOVE EXAM NITRILE LRG STRL (GLOVE) IMPLANT
GLOVE EXAM NITRILE MD LF STRL (GLOVE) IMPLANT
GLOVE EXAM NITRILE XL STR (GLOVE) IMPLANT
GLOVE EXAM NITRILE XS STR PU (GLOVE) IMPLANT
GLOVE OPTIFIT SS 6.5 STRL BRWN (GLOVE) ×8 IMPLANT
GOWN BRE IMP SLV AUR LG STRL (GOWN DISPOSABLE) ×2 IMPLANT
GOWN BRE IMP SLV AUR XL STRL (GOWN DISPOSABLE) IMPLANT
GOWN STRL REIN 2XL LVL4 (GOWN DISPOSABLE) IMPLANT
GOWN STRL REUS W/ TWL LRG LVL3 (GOWN DISPOSABLE) ×2 IMPLANT
GOWN STRL REUS W/ TWL XL LVL3 (GOWN DISPOSABLE) ×1 IMPLANT
GOWN STRL REUS W/TWL LRG LVL3 (GOWN DISPOSABLE) ×2
GOWN STRL REUS W/TWL XL LVL3 (GOWN DISPOSABLE) ×1
GRAFT BN 5X1XSPNE CVD POST DBM (Bone Implant) ×1 IMPLANT
GRAFT BONE MAGNIFUSE 1X5CM (Bone Implant) ×1 IMPLANT
KIT BASIN OR (CUSTOM PROCEDURE TRAY) ×2 IMPLANT
KIT POSITION SURG JACKSON T1 (MISCELLANEOUS) ×2 IMPLANT
KIT ROOM TURNOVER OR (KITS) ×2 IMPLANT
NEEDLE HYPO 25X1 1.5 SAFETY (NEEDLE) ×2 IMPLANT
NEEDLE SPNL 18GX3.5 QUINCKE PK (NEEDLE) IMPLANT
NS IRRIG 1000ML POUR BTL (IV SOLUTION) ×2 IMPLANT
PACK LAMINECTOMY NEURO (CUSTOM PROCEDURE TRAY) ×2 IMPLANT
PAD ARMBOARD 7.5X6 YLW CONV (MISCELLANEOUS) ×4 IMPLANT
ROD DANEK 30MM (Rod) ×2 IMPLANT
ROD DANEK 40MM (Rod) ×2 IMPLANT
SCREW PEDICLE VA L635 6.5X50M (Screw) ×4 IMPLANT
SCREW SET BREAK OFF (Screw) ×8 IMPLANT
SPONGE GAUZE 4X4 12PLY (GAUZE/BANDAGES/DRESSINGS) IMPLANT
SPONGE LAP 4X18 X RAY DECT (DISPOSABLE) ×2 IMPLANT
SPONGE SURGIFOAM ABS GEL 100 (HEMOSTASIS) ×2 IMPLANT
STRIP CLOSURE SKIN 1/2X4 (GAUZE/BANDAGES/DRESSINGS) IMPLANT
SUT ETHILON 3 0 FSL (SUTURE) ×2 IMPLANT
SUT PROLENE 6 0 BV (SUTURE) IMPLANT
SUT VIC AB 0 CT1 18XCR BRD8 (SUTURE) ×2 IMPLANT
SUT VIC AB 0 CT1 8-18 (SUTURE) ×2
SUT VIC AB 2-0 CT1 18 (SUTURE) ×4 IMPLANT
SUT VIC AB 3-0 SH 8-18 (SUTURE) ×2 IMPLANT
SYR 20ML ECCENTRIC (SYRINGE) ×2 IMPLANT
TOWEL OR 17X24 6PK STRL BLUE (TOWEL DISPOSABLE) ×2 IMPLANT
TOWEL OR 17X26 10 PK STRL BLUE (TOWEL DISPOSABLE) ×2 IMPLANT
TRAY FOLEY CATH 14FRSI W/METER (CATHETERS) ×2 IMPLANT
WATER STERILE IRR 1000ML POUR (IV SOLUTION) ×2 IMPLANT

## 2013-04-01 NOTE — Anesthesia Procedure Notes (Signed)
Procedure Name: Intubation Date/Time: 04/01/2013 9:51 AM Performed by: Jacob Moores Pre-anesthesia Checklist: Patient identified, Emergency Drugs available, Suction available, Patient being monitored and Timeout performed Patient Re-evaluated:Patient Re-evaluated prior to inductionOxygen Delivery Method: Circle system utilized Preoxygenation: Pre-oxygenation with 100% oxygen Intubation Type: IV induction and Rapid sequence Ventilation: Two handed mask ventilation required and Oral airway inserted - appropriate to patient size Laryngoscope Size: Mac, 4, Miller and 2 Grade View: Grade III Tube type: Oral Tube size: 7.5 mm Number of attempts: 3 Airway Equipment and Method: Video-laryngoscopy and Oral airway Placement Confirmation: ETT inserted through vocal cords under direct vision,  positive ETCO2 and breath sounds checked- equal and bilateral Secured at: 23 cm Tube secured with: Tape Dental Injury: Teeth and Oropharynx as per pre-operative assessment  Future Recommendations: Recommend- induction with short-acting agent, and alternative techniques readily available Comments: Attempted intubation with Miller 2 and obtained grade 4 view. Pre-oxygenation continued before attempted intubation with Mac 4 and obtained grade 3 view. Pre-oxygenation continued before third attempt with glidescope to obtain grade 1 view.

## 2013-04-01 NOTE — Progress Notes (Addendum)
Pt arrived from pacu via stretcher with spouse and belongings at side at [place]. Pt A&O x4, MAE x4 c/o numbness to RLE which he report was there prior to surgery. Pt HC to back was stained, reinforced with abd pad. Foley and IV remains intact. Pt pain assessed and encouraged to use PCA, pt oriented to unit and call light within reach. Will continue to monitor quietly. Pt spouse remains at bedside. Francis Gaines Victoriah Wilds RN [facility]

## 2013-04-01 NOTE — Anesthesia Preprocedure Evaluation (Addendum)
Anesthesia Evaluation  Patient identified by MRN, date of birth, ID band Patient awake    Reviewed: Allergy & Precautions, H&P , NPO status , Patient's Chart, lab work & pertinent test results, reviewed documented beta blocker date and time   Airway Mallampati: III TM Distance: >3 FB Neck ROM: Full    Dental  (+) Dental Advisory Given, Teeth Intact   Pulmonary shortness of breath,  breath sounds clear to auscultation  Pulmonary exam normal       Cardiovascular hypertension, Pt. on medications and Pt. on home beta blockers + CAD and + Cardiac Stents Rhythm:Regular Rate:Normal  '13 stress test: no ischemia, EF 75%   Neuro/Psych  Headaches, Chronic back pain    GI/Hepatic negative GI ROS, Neg liver ROS,   Endo/Other  Morbid obesity  Renal/GU negative Renal ROS     Musculoskeletal  (+) Arthritis -,   Abdominal (+) + obese,   Peds  Hematology   Anesthesia Other Findings   Reproductive/Obstetrics                          Anesthesia Physical Anesthesia Plan  ASA: III  Anesthesia Plan: General   Post-op Pain Management:    Induction: Intravenous  Airway Management Planned: Oral ETT  Additional Equipment:   Intra-op Plan:   Post-operative Plan: Extubation in OR  Informed Consent: I have reviewed the patients History and Physical, chart, labs and discussed the procedure including the risks, benefits and alternatives for the proposed anesthesia with the patient or authorized representative who has indicated his/her understanding and acceptance.   Dental advisory given  Plan Discussed with: CRNA, Anesthesiologist and Surgeon  Anesthesia Plan Comments: (Plan routine monitors, GETA)       Anesthesia Quick Evaluation

## 2013-04-01 NOTE — Anesthesia Postprocedure Evaluation (Signed)
Anesthesia Post Note  Patient: Stephen Medina  Procedure(s) Performed: Procedure(s) (LRB): POSTERIOR LUMBAR FUSION 1 LEVEL (N/A)  Anesthesia type: general  Patient location: PACU  Post pain: Pain level controlled  Post assessment: Patient's Cardiovascular Status Stable  Last Vitals:  Filed Vitals:   04/01/13 1637  BP: 166/82  Pulse: 73  Temp:   Resp: 15    Post vital signs: Reviewed and stable  Level of consciousness: sedated  Complications: No apparent anesthesia complications

## 2013-04-01 NOTE — H&P (Signed)
BP 138/84  Pulse 71  Temp(Src) 98 F (36.7 C) (Oral)  SpO2 96% Mr. Nickie Deren came in today for evaluation. He has been having some pain now for about the last six to eight weeks. It is something that just came up out of the blue and is a very sharp pain is his lower back. The pain had been bad enough that he said that he had been using some paregoric for treatment.  DATA: Plain x-rays today show all of the hardware to be in good position. It is not clear whether or not he has a solid fusion, but there has been absolutely no movement of the screws, rods, or cages. PHYSICAL EXAMINATION: He is profoundly weak in his right lower extremity. He only has may 4-4-/5 in the hip flexors. Dorsiflexors and plantar flexors are very weak. 2/5 for the dorsiflexors. Plantar flexors are about 3/5. Quadriceps and hamstrings 3 to 4/5. Mr. Buckman has a markedly antalgic gait but it is also because of fairly profound weakness in the right lower extremity. Reflexes were no elicited at the knees. Proprioception was intact. No clonus. Toes down during plantar stimulation.  Past Medical History  Diagnosis Date  . Hypertension   . Shortness of breath   . Arthritis    Past Surgical History  Procedure Laterality Date  . Cardiac catheterization      05    1 stent   dr Wynonia Lawman  . Back surgery      x2  . Joint replacement      x4  rt knee, x2lt arthroscopy  . Foot surgery    . Appendectomy     Prior to Admission medications   Medication Sig Start Date End Date Taking? Authorizing Provider  aspirin EC 325 MG tablet Take 325 mg by mouth daily.   Yes Historical Provider, MD  clopidogrel (PLAVIX) 75 MG tablet Take 75 mg by mouth daily with breakfast.   Yes Historical Provider, MD  metoprolol tartrate (LOPRESSOR) 25 MG tablet Take 25 mg by mouth daily.    Yes Historical Provider, MD  quinapril-hydrochlorothiazide (ACCURETIC) 20-12.5 MG per tablet Take 1 tablet by mouth daily.   Yes Historical Provider, MD  rosuvastatin  (CRESTOR) 40 MG tablet Take 40 mg by mouth daily.   Yes Historical Provider, MD   History   Social History  . Marital Status: Married    Spouse Name: N/A    Number of Children: N/A  . Years of Education: N/A   Occupational History  . Not on file.   Social History Main Topics  . Smoking status: Never Smoker   . Smokeless tobacco: Not on file  . Alcohol Use: Yes     Comment: weekly  . Drug Use: No  . Sexual Activity: Not on file   Other Topics Concern  . Not on file   Social History Narrative  . No narrative on file   History reviewed. No pertinent family history. Mr. Hoffmann returns today with the results of his myelogram, postmyelogram CT. As this pattern, Mr. Degen has failed now at the level below the fusion, which is L4-5. I started at L2-3, then did L3-4, now L4-5 and he is stenotic at that level with severe compression of the thecal sac. He knows he needs to do this operation as he has a great deal of weakness and has agreed to proceed with an L4/5 decompression and arthrodesis with pedicle screw fixation. Risks and benefits were explained and he is well versed  having been operated on twice before.

## 2013-04-01 NOTE — Transfer of Care (Signed)
Immediate Anesthesia Transfer of Care Note  Patient: Stephen Medina  Procedure(s) Performed: Procedure(s) with comments: POSTERIOR LUMBAR FUSION 1 LEVEL (N/A) - L45 posterior lumbar interbody fusion with interbody prosthesis posterior lateral arthrodesis and posterior nonsegmental instrumentation  Patient Location: PACU  Anesthesia Type:General  Level of Consciousness: awake and alert   Airway & Oxygen Therapy: Patient Spontanous Breathing and Patient connected to nasal cannula oxygen  Post-op Assessment: Report given to PACU RN, Post -op Vital signs reviewed and stable and Patient moving all extremities X 4  Post vital signs: Reviewed and stable  Complications: No apparent anesthesia complications

## 2013-04-01 NOTE — Preoperative (Signed)
Beta Blockers   Reason not to administer Beta Blockers:Not Applicable, patient took Metoprolol 04/01/13.

## 2013-04-01 NOTE — Progress Notes (Signed)
Pt arrived to pacu with large whelp rash on back and buttocks. Sarah CRNA states that MD is aware--thinks may be related to prep --and no new orders. Will cont to monitor.

## 2013-04-01 NOTE — Op Note (Signed)
04/01/2013  3:51 PM  PATIENT:  Stephen Medina  63 y.o. 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  PRE-OPERATIVE DIAGNOSIS:  lumbar spondylosis lumbar stenosis lumbar radiculopathy L4/5  POST-OPERATIVE DIAGNOSIS:  lumbar spondylosis lumbar stenosis lumbar radiculopathy L4/5   PROCEDURE:  Procedure(s): POSTERIOR lumbar interbody arthrodesis L4/5, Nuvasive Peek cages 12x28mm with local autograft Posterior lumbar decompression L4/5 with bony removal beyond the exposure needed for a PLIF Posterolateral arthrodesis L4/5 morselized allograft Non segmental pedicle screw fixation Legacy instrumentation (medtronic) Removal L2,L3, pedicle screws and rods  SURGEON:  Surgeon(s): Winfield Cunas, MD Hosie Spangle, MD  ASSISTANTS:Nudelman, Herbie Baltimore  ANESTHESIA:   general  EBL:  Total I/O In: 5000 [I.V.:4000; IV Piggyback:1000] Out: 1370 [Urine:170; Blood:1200]  BLOOD ADMINISTERED:none  CELL SAVER GIVEN:none  COUNT:per nursing  DRAINS: none   SPECIMEN:  No Specimen  DICTATION: Stephen Medina is a 63 y.o. male whom was taken to the operating room intubated, and placed under a general anesthetic without difficulty. A foley catheter was placed under sterile conditions. He was positioned prone on a Jackson stable with all pressure points properly padded.  His lumbar region was prepped and draped in a sterile manner. I infiltrated 10cc's 1/2%lidocaine/1:2000,000 strength epinephrine into the planned incision. I opened the skin with a 10 blade and took the incision down to the thoracolumbar fascia. I exposed the lamina of L2,3,4,and 5 in a subperiosteal fashion bilaterally. I confirmed my location with an intraoperative xray.  I placed self retaining retractors and started the hardware removal. I removed the L2,3 screws and the rods bilaterally. I left the L4 screws in place.  I decompressed the spinal canal at L4/5 by performing a complete laminectomy and inferior L4  facetectomies bilaterally to fully expose and decompress the L4 roots. This was well beyond the exposure needed for a simple Plif.The L4 roots hugged the pedicle and were covered both by bone spurs but also redundant ligamentum flavum. Both roots were fully decompressed and had free egress through the neural foramina.   PLIF's were performed at L4/5 in the same fashion. I opened the disc space with a 15 blade then used a variety of instruments to remove the disc and prepare the space for the arthrodesis. I used curettes, rongeurs, punches, shavers for the disc space, and rasps in the discetomy. I measured the disc space and placed 12x75mm  Peek cages(Nuvasive) into the disc space(s).  I decorticated the lateral bone at L4,and L5. I then placed morselized allograft(bone in a bag) on the decorticated surfaces to complete the posterolateral arthrodesis.  We placed pedicle screws at L5, using fluoroscopic guidance. I drilled a pilot hole, then cannulated the pedicle with a probe at each site. I then tapped each pedicle, assessing each site for pedicle violations. No cutouts were appreciated. Screws (Legacy, medtronic) were then placed at each site without difficulty. Final films were performed and all screws appeared to be in good position.  I closed the wound in a layered fashion. I approximated the thoracolumbar fascia, subcutaneous, and subcuticular planes with vicryl sutures. I placed a 3-0 nylon to close the skin edges.  I used a honeycomb for a sterile dressing.     PLAN OF CARE: Admit to inpatient   PATIENT DISPOSITION:  PACU - hemodynamically stable.   Delay start of Pharmacological VTE agent (>24hrs) due to surgical blood loss or risk of bleeding:  yes

## 2013-04-02 MED ORDER — HYDROMORPHONE HCL 2 MG PO TABS
2.0000 mg | ORAL_TABLET | ORAL | Status: DC | PRN
  Administered 2013-04-02: 2 mg via ORAL
  Filled 2013-04-02: qty 1

## 2013-04-02 NOTE — Progress Notes (Addendum)
Pt PCA discontinued at 1315. Pt self adm 0.6mg  and 15.24ml was wasted with another Therapist, sports. Pt remains comfortable in bed and will continue to monitor quietly. Delia Heady RN

## 2013-04-02 NOTE — Evaluation (Signed)
Physical Therapy Evaluation Patient Details Name: Stephen Medina MRN: [id number] DOB: [birth year] Today's Date: 04/02/2013 Time: 5638-7564 PT Time Calculation (min): 29 min  PT Assessment / Plan / Recommendation History of Present Illness   s/p PLIF L4-L5  Clinical Impression  Patient is s/p lumbar surgery resulting in functional limitations due to the deficits listed below (see PT Problem List). Pt limited due to pain but agreeable to therapy. Patient will benefit from skilled PT to increase their independence and safety with mobility to allow discharge to the venue listed below.     PT Assessment  Patient needs continued PT services    Follow Up Recommendations  Home health PT (May not need HHPT depending on progress made)    Equipment Recommendations  Rolling walker with 5" wheels;Other (comment) (If pt unable to borrow friends walker)    Frequency Min 5X/week    Precautions / Restrictions Precautions Precautions: Back Precaution Comments: Reviewed 3/3 back precautions. Required Braces or Orthoses: Spinal Brace Spinal Brace: Lumbar corset;Applied in sitting position   Pertinent Vitals/Pain 4/10 pain      Mobility  Bed Mobility Overal bed mobility: Needs Assistance Bed Mobility: Rolling;Sidelying to Sit Rolling: Mod assist Sidelying to sit: Mod assist General bed mobility comments: Incr time due to pain. VCs for hand placement and sequencing of log roll. Transfers Overall transfer level: Needs assistance Equipment used: Rolling walker (2 wheeled) Transfers: Sit to/from Stand Sit to Stand: +2 physical assistance;Mod assist Stand pivot transfers: +2 physical assistance;Min assist General transfer comment: Assist for power up from bed and for steadying while transitioning hands from bed to RW.  Ambulation/Gait Ambulation/Gait assistance: Min assist;+2 physical assistance Ambulation Distance (Feet): 5 Feet Assistive device: Rolling walker (2 wheeled) Gait  Pattern/deviations: Step-through pattern;Decreased stride length;Shuffle;Antalgic Gait velocity: decreased    Exercises     PT Diagnosis: Difficulty walking;Generalized weakness;Acute pain  PT Problem List: Decreased strength;Decreased activity tolerance;Decreased mobility;Decreased knowledge of use of DME;Decreased knowledge of precautions;Pain PT Treatment Interventions: DME instruction;Gait training;Stair training;Functional mobility training;Therapeutic activities;Therapeutic exercise;Balance training;Patient/family education     PT Goals(Current goals can be found in the care plan section) Acute Rehab PT Goals Patient Stated Goal: to return home PT Goal Formulation: With patient Time For Goal Achievement: 04/09/13 Potential to Achieve Goals: Good  Visit Information  Last PT Received On: 04/02/13 Assistance Needed: +2 PT/OT/SLP Co-Evaluation/Treatment: Yes Reason for Co-Treatment: For patient/therapist safety PT goals addressed during session: Mobility/safety with mobility OT goals addressed during session: ADL's and self-care       Prior [place] expects to be discharged to:: Private residence Living Arrangements: Spouse/significant other Available Help at Discharge: Family;Available PRN/intermittently (wife works during day) Type of Home: House Home Access: Stairs to enter Technical brewer of Steps: 1 Home Layout: One level Home Equipment: [place] - single point Additional Comments: Pt reports he can borrow RW and 3n1 from family/friends. Prior Function Level of Independence: Independent with assistive device(s) Comments: Has been using cane as needed for past few months. Communication Communication: No difficulties    Cognition  Cognition Arousal/Alertness: Awake/alert Behavior During Therapy: WFL for tasks assessed/performed Overall Cognitive Status: Within Functional Limits for tasks assessed    Extremity/Trunk Assessment  Upper Extremity Assessment Upper Extremity Assessment: Overall WFL for tasks assessed Lower Extremity Assessment Lower Extremity Assessment: Overall WFL for tasks assessed   Balance    End of Session PT - End of Session Equipment Utilized During Treatment: Gait belt;Back brace Activity Tolerance: Patient limited by pain;Patient limited  by fatigue Patient left: in chair;with call bell/phone within reach Nurse Communication: Mobility status  GP     Everitt Wenner 04/02/2013, 3:16 PM  Antoine Poche, [place] DPT [phone]

## 2013-04-02 NOTE — Progress Notes (Signed)
Subjective: Patient reports Patient notes there is moderate back soreness. Catheters come out he has not voided yet.  Objective: Vital signs in last 24 hours: Temp:  [97.8 F (36.6 C)-99.1 F (37.3 C)] 99.1 F (37.3 C) (03/21 0520) Pulse Rate:  [68-93] 79 (03/21 0520) Resp:  [8-22] 20 (03/21 0856) BP: (116-166)/(51-87) 123/56 mmHg (03/21 0520) SpO2:  [95 %-100 %] 97 % (03/21 0856)  Intake/Output from previous day: 03/20 0701 - 03/21 0700 In: 5400 [I.V.:4300; IV Piggyback:1100] Out: 1845 [Urine:645; Blood:1200] Intake/Output this shift:    Dressing is clean and dry. Motor function appears good and lower extremities.  Lab Results: No results found for this basename: WBC, HGB, HCT, PLT,  in the last 72 hours BMET No results found for this basename: NA, K, CL, CO2, GLUCOSE, BUN, CREATININE, CALCIUM,  in the last 72 hours  Studies/Results: Dg Lumbar Spine 2-3 Views  04/01/2013   CLINICAL DATA:  L4-5 fusion  EXAM: LUMBAR SPINE - 2-3 VIEW; DG C-ARM 1-60 MIN  COMPARISON:  DG LUMBAR SPINE 1 VIEW dated 04/01/2013  FINDINGS: Two intraoperative spot images demonstrate posterior pedicle screws at L4 and L5. No visible complicating feature on these intraoperative spot images.  IMPRESSION: L4-5 posterior fusion.   Electronically Signed   By: Rolm Baptise M.D.   On: 04/01/2013 16:18   Dg Lumbar Spine 1 View  04/01/2013   CLINICAL DATA:  L4-5 PLIF.  EXAM: LUMBAR SPINE - 1 VIEW  COMPARISON:  Lumbar CT myelogram 02/08/2013  FINDINGS: Single portable intraoperative cross-table lateral radiograph of the lumbar spine is provided. Tissue spreaders are present posteriorly. A surgical probe is present with tip at the posterior margin of the posterior elements at the L4-5 disc space level. L4 pedicle screws remain in place. L2 and L3 pedicle screws have been removed. Interbody fusion devices remain at L2-3 and L3-4.  IMPRESSION: Intraoperative localization as above.   Electronically Signed   By: Logan Bores    On: 04/01/2013 15:30   Dg C-arm 1-60 Min  04/01/2013   CLINICAL DATA:  L4-5 fusion  EXAM: LUMBAR SPINE - 2-3 VIEW; DG C-ARM 1-60 MIN  COMPARISON:  DG LUMBAR SPINE 1 VIEW dated 04/01/2013  FINDINGS: Two intraoperative spot images demonstrate posterior pedicle screws at L4 and L5. No visible complicating feature on these intraoperative spot images.  IMPRESSION: L4-5 posterior fusion.   Electronically Signed   By: Rolm Baptise M.D.   On: 04/01/2013 16:18    Assessment/Plan: DC PCA start on oral pain medication.  LOS: 1 day  Mobilize with course of a brace arrives.   Ilyas Lipsitz J 04/02/2013, 10:48 AM

## 2013-04-02 NOTE — Progress Notes (Signed)
Pt foley was discontinued this am at 0900. Awaiting on pt to void. Will continue to monitor quietly. Francis Gaines Stellarose Cerny RN.

## 2013-04-02 NOTE — Evaluation (Signed)
Occupational Therapy Evaluation Patient Details Name: Stephen Medina MRN: [id number] DOB: 1950-08-20 Today's Date: 04/02/2013 Time: 0093-8182 OT Time Calculation (min): 29 min  OT Assessment / Plan / Recommendation History of present illness  s/p L4-5 PLIF   Clinical Impression   Pt s/p L4-5 PLIF. Will continue to follow acutely in order to address below problem list.  Will likely need no further OT services at d/c but will update pending progress.  Pt's wife to confirm whether they can borrow RW and 3n1 from family/friend.  OT Assessment  Patient needs continued OT Services    Follow Up Recommendations  No OT follow up;Supervision - Intermittent    Barriers to Discharge      Equipment Recommendations   (TBD)    Recommendations for Other Services    Frequency  Min 2X/week    Precautions / Restrictions Precautions Precautions: Back Precaution Comments: Reviewed 3/3 back precautions. Required Braces or Orthoses: Spinal Brace Spinal Brace: Lumbar corset;Applied in sitting position   Pertinent Vitals/Pain See vitals    ADL  Eating/Feeding: Performed;Independent Where Assessed - Eating/Feeding: Chair Grooming: Performed;Wash/dry face;Set up Where Assessed - Grooming: Supported sitting Upper Body Bathing: Simulated;Set up Where Assessed - Upper Body Bathing: Unsupported sitting Lower Body Bathing: Simulated;+2 Total assistance Where Assessed - Lower Body Bathing: Supported sit to stand Upper Body Dressing: Performed;Set up Where Assessed - Upper Body Dressing: Unsupported sitting Lower Body Dressing: Performed;+2 Total assistance Where Assessed - Lower Body Dressing: Supported sit to stand Toilet Transfer: Simulated;+2 Total assistance Toilet Transfer Method: Sit to stand;Stand pivot Science writer:  (bed<>chair) Equipment Used: Gait belt;Rolling walker;Back brace Transfers/Ambulation Related to ADLs: +2 assist for balance and steadying. ADL Comments: Incr  time for all tasks due to pain and feeling stiff from laying in bed.  RN performed dressing change while pt sat EOB.      OT Diagnosis: Acute pain;Generalized weakness  OT Problem List: Decreased strength;Decreased activity tolerance;Impaired balance (sitting and/or standing);Decreased knowledge of use of DME or AE;Decreased knowledge of precautions;Pain OT Treatment Interventions: Self-care/ADL training;DME and/or AE instruction;Therapeutic activities;Patient/family education;Balance training   OT Goals(Current goals can be found in the care plan section) Acute Rehab OT Goals Patient Stated Goal: to return home OT Goal Formulation: With patient Time For Goal Achievement: 04/09/13 Potential to Achieve Goals: Good  Visit Information  Last OT Received On: 04/02/13 Assistance Needed: +2 PT/OT/SLP Co-Evaluation/Treatment: Yes Reason for Co-Treatment: For patient/therapist safety OT goals addressed during session: ADL's and self-care       Prior [place] expects to be discharged to:: Private residence Living Arrangements: Spouse/significant other Available Help at Discharge: Family;Available PRN/intermittently (wife works during day) Type of Home: House Home Access: Stairs to enter Technical brewer of Steps: 1 Home Layout: One level Home Equipment: [place] - single point Additional Comments: Pt reports he can borrow RW and 3n1 from family/friends. Prior Function Level of Independence: Independent with assistive device(s) Comments: Has been using cane as needed for past few months. Communication Communication: No difficulties         Vision/Perception Vision - History Baseline Vision: Wears glasses all the time   Cognition  Cognition Arousal/Alertness: Awake/alert Behavior During Therapy: WFL for tasks assessed/performed Overall Cognitive Status: Within Functional Limits for tasks assessed    Extremity/Trunk Assessment Upper  Extremity Assessment Upper Extremity Assessment: Overall WFL for tasks assessed     Mobility Bed Mobility Overal bed mobility: Needs Assistance Bed Mobility: Rolling;Sidelying to Sit Rolling: Mod  assist Sidelying to sit: Mod assist General bed mobility comments: Incr time due to pain. VCs for hand placement and sequencing of log roll. Transfers Overall transfer level: Needs assistance Equipment used: Rolling walker (2 wheeled) Transfers: Sit to/from [facility] Sit to Stand: +2 physical assistance;Mod assist Stand pivot transfers: +2 physical assistance;Min assist General transfer comment: Assist for power up from bed and for steadying while transitioning hands from bed to RW.      Exercise     Balance     End of Session OT - End of Session Equipment Utilized During Treatment: Gait belt;Rolling walker;Back brace Activity Tolerance: Patient tolerated treatment well Patient left: in chair;with call bell/phone within reach;with family/visitor present Nurse Communication: Mobility status  GO    04/02/2013 Darrol Jump OTR/L Pager [phone] Office [phone]  Darrol Jump 04/02/2013, 2:43 PM

## 2013-04-03 MED ORDER — DIAZEPAM 5 MG PO TABS: 5.0000 mg | ORAL_TABLET | Freq: Four times a day (QID) | ORAL | Status: DC | PRN

## 2013-04-03 MED ORDER — HYDROCODONE-ACETAMINOPHEN 5-325 MG PO TABS: 1.0000 | ORAL_TABLET | Freq: Four times a day (QID) | ORAL | Status: DC | PRN

## 2013-04-03 NOTE — Discharge Summary (Signed)
Physician Discharge Summary  Patient ID: Stephen Medina MRN: [id number] DOB/AGE: 1950/11/03 63 y.o.  Admit date: 04/01/2013 Discharge date: 04/03/2013  Admission Diagnoses: Lumbar spondylosis    Discharge Diagnoses: Same   Discharged Condition: good  Hospital Course: The patient was admitted on 04/01/2013 and taken to the operating room where the patient underwent lumbar fusion L4-5. The patient tolerated the procedure well and was taken to the recovery room and then to the floor in stable condition. The hospital course was routine. There were no complications. The wound remained clean dry and intact. Pt had appropriate back soreness. No complaints of leg pain or new N/T/W. The patient remained afebrile with stable vital signs, and tolerated a regular diet. The patient continued to increase activities, and pain was well controlled with oral pain medications.   Consults: None  Significant Diagnostic Studies:  Results for orders placed during the hospital encounter of 03/28/13  SURGICAL PCR SCREEN      Result Value Ref Range   MRSA, PCR NEGATIVE  NEGATIVE   Staphylococcus aureus POSITIVE (*) NEGATIVE  BASIC METABOLIC PANEL      Result Value Ref Range   Sodium 140  137 - 147 mEq/L   Potassium 4.1  3.7 - 5.3 mEq/L   Chloride 99  96 - 112 mEq/L   CO2 25  19 - 32 mEq/L   Glucose, Bld 108 (*) 70 - 99 mg/dL   BUN 11  6 - 23 mg/dL   Creatinine, Ser 0.77  0.50 - 1.35 mg/dL   Calcium 9.3  8.4 - 10.5 mg/dL   GFR calc non Af Amer >90  >90 mL/min   GFR calc Af Amer >90  >90 mL/min  CBC      Result Value Ref Range   WBC 5.7  4.0 - 10.5 K/uL   RBC 4.47  4.22 - 5.81 MIL/uL   Hemoglobin 14.2  13.0 - 17.0 g/dL   HCT 41.1  39.0 - 52.0 %   MCV 91.9  78.0 - 100.0 fL   MCH 31.8  26.0 - 34.0 pg   MCHC 34.5  30.0 - 36.0 g/dL   RDW 14.4  11.5 - 15.5 %   Platelets 225  150 - 400 K/uL  TYPE AND SCREEN      Result Value Ref Range   ABO/RH(D) O POS     Antibody Screen NEG     Sample Expiration  04/11/2013      Dg Chest 2 View  03/28/2013   CLINICAL DATA:  Preoperative for back surgery.  EXAM: CHEST  2 VIEW  COMPARISON:  DG CHEST 2 VIEW dated 11/18/2011  FINDINGS: The lungs are adequately inflated. There is no focal infiltrate. The cardiac silhouette is normal in size. The pulmonary vascularity is not engorged. The mediastinum is normal in width. Coronary artery calcifications may be present. The observed portions of the bony thorax exhibit no acute abnormalities.  IMPRESSION: 1. There is no evidence of acute cardiopulmonary disease. 2. Coronary artery calcifications may be present.   Electronically Signed   By: Romey Cohea  [place]   On: 03/28/2013 14:41   Dg Lumbar Spine 2-3 Views  04/01/2013   CLINICAL DATA:  L4-5 fusion  EXAM: LUMBAR SPINE - 2-3 VIEW; DG C-ARM 1-60 MIN  COMPARISON:  DG LUMBAR SPINE 1 VIEW dated 04/01/2013  FINDINGS: Two intraoperative spot images demonstrate posterior pedicle screws at L4 and L5. No visible complicating feature on these intraoperative spot images.  IMPRESSION: L4-5 posterior fusion.  Electronically Signed   By: Rolm Baptise M.D.   On: 04/01/2013 16:18   Dg Lumbar Spine 1 View  04/01/2013   CLINICAL DATA:  L4-5 PLIF.  EXAM: LUMBAR SPINE - 1 VIEW  COMPARISON:  Lumbar CT myelogram 02/08/2013  FINDINGS: Single portable intraoperative cross-table lateral radiograph of the lumbar spine is provided. Tissue spreaders are present posteriorly. A surgical probe is present with tip at the posterior margin of the posterior elements at the L4-5 disc space level. L4 pedicle screws remain in place. L2 and L3 pedicle screws have been removed. Interbody fusion devices remain at L2-3 and L3-4.  IMPRESSION: Intraoperative localization as above.   Electronically Signed   By: Logan Bores   On: 04/01/2013 15:30   Dg C-arm 1-60 Min  04/01/2013   CLINICAL DATA:  L4-5 fusion  EXAM: LUMBAR SPINE - 2-3 VIEW; DG C-ARM 1-60 MIN  COMPARISON:  DG LUMBAR SPINE 1 VIEW dated 04/01/2013  FINDINGS:  Two intraoperative spot images demonstrate posterior pedicle screws at L4 and L5. No visible complicating feature on these intraoperative spot images.  IMPRESSION: L4-5 posterior fusion.   Electronically Signed   By: Rolm Baptise M.D.   On: 04/01/2013 16:18    Antibiotics:  Anti-infectives   Start     Dose/Rate Route Frequency Ordered Stop   04/01/13 2200  ceFAZolin (ANCEF) IVPB 2 g/50 mL premix     2 g 100 mL/hr over 30 Minutes Intravenous Every 8 hours 04/01/13 1842 04/02/13 0641   04/01/13 0600  ceFAZolin (ANCEF) 3 g in dextrose 5 % 50 mL IVPB     3 g 160 mL/hr over 30 Minutes Intravenous On call to O.R. 03/31/13 1425 04/01/13 1400      Discharge Exam: Blood pressure 110/55, pulse 80, temperature 98.4 F (36.9 C), temperature source Oral, resp. rate 18, SpO2 96.00%. Neurologic: Grossly normal Incision okay  Discharge Medications:     Medication List         aspirin EC 325 MG tablet  Take 325 mg by mouth daily.     clopidogrel 75 MG tablet  Commonly known as:  PLAVIX  Take 75 mg by mouth daily with breakfast.     diazepam 5 MG tablet  Commonly known as:  VALIUM  Take 1 tablet (5 mg total) by mouth every 6 (six) hours as needed for muscle spasms.     HYDROcodone-acetaminophen 5-325 MG per tablet  Commonly known as:  NORCO/VICODIN  Take 1-2 tablets by mouth every 6 (six) hours as needed for moderate pain.     metoprolol tartrate 25 MG tablet  Commonly known as:  LOPRESSOR  Take 25 mg by mouth daily.     quinapril-hydrochlorothiazide 20-12.5 MG per tablet  Commonly known as:  ACCURETIC  Take 1 tablet by mouth daily.     rosuvastatin 40 MG tablet  Commonly known as:  CRESTOR  Take 40 mg by mouth daily.        Disposition: Home   Final Dx: PLIF L4-5      Discharge Orders   Future Orders Complete By Expires   Call MD for:  difficulty breathing, headache or visual disturbances  As directed    Call MD for:  persistant nausea and vomiting  As directed     Call MD for:  redness, tenderness, or signs of infection (pain, swelling, redness, odor or green/yellow discharge around incision site)  As directed    Call MD for:  severe uncontrolled pain  As directed  Call MD for:  temperature >100.4  As directed    Diet - low sodium heart healthy  As directed    Discharge instructions  As directed    Comments:     No strenuous activity, no bending or twisting, no heavy lifting, may be up and around, may shower   Increase activity slowly  As directed    Remove dressing in 24 hours  As directed       Follow-up Information   Follow up with CABBELL,KYLE L, MD. Schedule an appointment as soon as possible for a visit in 2 weeks.   Specialty:  Neurosurgery   Contact information:   [address], [address]                         [address] [place] [address] [phone]        Signed: Eustace Moore 04/03/2013, 11:24 AM   \

## 2013-04-03 NOTE — Discharge Summary (Signed)
Patient education and instructions completed.  Prescriptions given for diazepam 5 mg and HYDROcodone-acetaminophen 5-325 mg.  IV removed.  Patient going home with wife.

## 2013-04-03 NOTE — Progress Notes (Signed)
Physical Therapy Treatment Patient Details Name: Stephen Medina MRN: [id number] DOB: September 09, 1950 Today's Date: 04/03/2013 Time: 8295-6213 PT Time Calculation (min): 20 min  PT Assessment / Plan / Recommendation  History of Present Illness     PT Comments   Excellent progress noted today.  Pt able to ambulate 130 ft with RW and min guar assist.  Follow Up Recommendations        Does the patient have the potential to tolerate intense rehabilitation     Barriers to Discharge        Equipment Recommendations       Recommendations for Other Services    Frequency     Progress towards PT Goals Progress towards PT goals: Progressing toward goals  Plan Current plan remains appropriate    Precautions / Restrictions Precautions Precautions: Back Precaution Comments: Reviewed 3/3 back precautions. Required Braces or Orthoses: Spinal Brace Spinal Brace: Lumbar corset;Applied in sitting position Restrictions Weight Bearing Restrictions: No   Pertinent Vitals/Pain 2/10    Mobility  Bed Mobility Rolling: Modified independent (Device/Increase time) Sidelying to sit: Min assist General bed mobility comments: verbal cues for logroll Transfers Equipment used: Rolling walker (2 wheeled) Sit to Stand: Min assist;From elevated surface General transfer comment: verbal cues for hand placement Ambulation/Gait Ambulation/Gait assistance: Min guard Ambulation Distance (Feet): 130 Feet Assistive device: Rolling walker (2 wheeled) Gait Pattern/deviations: Trunk flexed;Decreased stride length Gait velocity: decreased General Gait Details: verbal cues for posture    Exercises     PT Diagnosis:    PT Problem List:   PT Treatment Interventions:     PT Goals (current goals can now be found in the care plan section)    Visit Information  Last PT Received On: 04/03/13 Assistance Needed: +1    Subjective Data      Cognition  Cognition Arousal/Alertness: Awake/alert Behavior During  Therapy: WFL for tasks assessed/performed Overall Cognitive Status: Within Functional Limits for tasks assessed    Balance     End of Session PT - End of Session Equipment Utilized During Treatment: Gait belt;Back brace Activity Tolerance: Patient tolerated treatment well Patient left: in chair;with call bell/phone within reach Nurse Communication: Mobility status   GP     Lorriane Shire 04/03/2013, 10:02 AM  Lorrin Goodell, PT  Office # [phone] Pager [phone]

## 2013-04-04 NOTE — Progress Notes (Signed)
UR complete.  Evalisse Prajapati RN, MSN 

## 2013-04-05 LAB — POCT I-STAT 4, (NA,K, GLUC, HGB,HCT)
Glucose, Bld: 138 mg/dL — ABNORMAL HIGH (ref 70–99)
HCT: 33 % — ABNORMAL LOW (ref 39.0–52.0)
Hemoglobin: 11.2 g/dL — ABNORMAL LOW (ref 13.0–17.0)
Potassium: 4.6 mEq/L (ref 3.7–5.3)
Sodium: 137 mEq/L (ref 137–147)

## 2013-08-01 ENCOUNTER — Emergency Department (HOSPITAL_COMMUNITY): Payer: Medicare Other

## 2013-08-01 ENCOUNTER — Encounter (HOSPITAL_COMMUNITY): Payer: Self-pay | Admitting: Emergency Medicine

## 2013-08-01 ENCOUNTER — Inpatient Hospital Stay (HOSPITAL_COMMUNITY)
Admission: EM | Admit: 2013-08-01 | Discharge: 2013-08-07 | DRG: 378 | Disposition: A | Discharge status: Home / Self Care | Payer: Medicare Other | Attending: Internal Medicine | Admitting: Internal Medicine

## 2013-08-01 DIAGNOSIS — Z79899 Other long term (current) drug therapy: Secondary | ICD-10-CM

## 2013-08-01 DIAGNOSIS — G8929 Other chronic pain: Secondary | ICD-10-CM | POA: Diagnosis present

## 2013-08-01 DIAGNOSIS — I200 Unstable angina: Secondary | ICD-10-CM | POA: Diagnosis present

## 2013-08-01 DIAGNOSIS — I10 Essential (primary) hypertension: Secondary | ICD-10-CM | POA: Diagnosis present

## 2013-08-01 DIAGNOSIS — Z96659 Presence of unspecified artificial knee joint: Secondary | ICD-10-CM

## 2013-08-01 DIAGNOSIS — I440 Atrioventricular block, first degree: Secondary | ICD-10-CM | POA: Diagnosis present

## 2013-08-01 DIAGNOSIS — R079 Chest pain, unspecified: Secondary | ICD-10-CM

## 2013-08-01 DIAGNOSIS — I2510 Atherosclerotic heart disease of native coronary artery without angina pectoris: Secondary | ICD-10-CM

## 2013-08-01 DIAGNOSIS — Z9861 Coronary angioplasty status: Secondary | ICD-10-CM | POA: Diagnosis not present

## 2013-08-01 DIAGNOSIS — M549 Dorsalgia, unspecified: Secondary | ICD-10-CM | POA: Diagnosis present

## 2013-08-01 DIAGNOSIS — K250 Acute gastric ulcer with hemorrhage: Secondary | ICD-10-CM

## 2013-08-01 DIAGNOSIS — Z7982 Long term (current) use of aspirin: Secondary | ICD-10-CM | POA: Diagnosis not present

## 2013-08-01 DIAGNOSIS — I959 Hypotension, unspecified: Secondary | ICD-10-CM | POA: Diagnosis present

## 2013-08-01 DIAGNOSIS — Z6841 Body Mass Index (BMI) 40.0 and over, adult: Secondary | ICD-10-CM

## 2013-08-01 DIAGNOSIS — D62 Acute posthemorrhagic anemia: Secondary | ICD-10-CM | POA: Diagnosis present

## 2013-08-01 DIAGNOSIS — Z7902 Long term (current) use of antithrombotics/antiplatelets: Secondary | ICD-10-CM

## 2013-08-01 DIAGNOSIS — K254 Chronic or unspecified gastric ulcer with hemorrhage: Secondary | ICD-10-CM | POA: Diagnosis present

## 2013-08-01 DIAGNOSIS — I25110 Atherosclerotic heart disease of native coronary artery with unstable angina pectoris: Secondary | ICD-10-CM

## 2013-08-01 DIAGNOSIS — K922 Gastrointestinal hemorrhage, unspecified: Secondary | ICD-10-CM | POA: Diagnosis present

## 2013-08-01 DIAGNOSIS — I25119 Atherosclerotic heart disease of native coronary artery with unspecified angina pectoris: Secondary | ICD-10-CM

## 2013-08-01 HISTORY — DX: Atherosclerotic heart disease of native coronary artery without angina pectoris: I25.10

## 2013-08-01 LAB — COMPREHENSIVE METABOLIC PANEL
ALT: 25 U/L (ref 0–53)
AST: 36 U/L (ref 0–37)
Albumin: 4 g/dL (ref 3.5–5.2)
Alkaline Phosphatase: 75 U/L (ref 39–117)
Anion gap: 17 — ABNORMAL HIGH (ref 5–15)
BUN: 45 mg/dL — ABNORMAL HIGH (ref 6–23)
CO2: 23 mEq/L (ref 19–32)
Calcium: 9.5 mg/dL (ref 8.4–10.5)
Chloride: 100 mEq/L (ref 96–112)
Creatinine, Ser: 0.9 mg/dL (ref 0.50–1.35)
GFR calc Af Amer: >90 mL/min (ref >90)
GFR calc non Af Amer: >90 mL/min (ref >90)
Glucose, Bld: 133 mg/dL — ABNORMAL HIGH (ref 70–99)
Potassium: 4.3 mEq/L (ref 3.7–5.3)
Sodium: 140 mEq/L (ref 137–147)
Total Bilirubin: 0.9 mg/dL (ref 0.3–1.2)
Total Protein: 7.6 g/dL (ref 6.0–8.3)

## 2013-08-01 LAB — CBC
HCT: 37.6 % — ABNORMAL LOW (ref 39.0–52.0)
HCT: 32.2 % — ABNORMAL LOW (ref 39.0–52.0)
Hemoglobin: 12.6 g/dL — ABNORMAL LOW (ref 13.0–17.0)
Hemoglobin: 10.8 g/dL — ABNORMAL LOW (ref 13.0–17.0)
MCH: 31.1 pg (ref 26.0–34.0)
MCH: 31.2 pg (ref 26.0–34.0)
MCHC: 33.5 g/dL (ref 30.0–36.0)
MCHC: 33.5 g/dL (ref 30.0–36.0)
MCV: 92.8 fL (ref 78.0–100.0)
MCV: 93.1 fL (ref 78.0–100.0)
Platelets: 243 10*3/uL (ref 150–400)
Platelets: 198 10*3/uL (ref 150–400)
RBC: 4.05 MIL/uL — ABNORMAL LOW (ref 4.22–5.81)
RBC: 3.46 MIL/uL — ABNORMAL LOW (ref 4.22–5.81)
RDW: 15.3 % (ref 11.5–15.5)
RDW: 15.5 % (ref 11.5–15.5)
WBC: 9.3 10*3/uL (ref 4.0–10.5)
WBC: 7.5 10*3/uL (ref 4.0–10.5)

## 2013-08-01 LAB — PRO B NATRIURETIC PEPTIDE: Pro B Natriuretic peptide (BNP): 61.5 pg/mL (ref 0–125)

## 2013-08-01 LAB — URINALYSIS, ROUTINE W REFLEX MICROSCOPIC
Bilirubin Urine: NEGATIVE
Glucose, UA: NEGATIVE mg/dL
Hgb urine dipstick: NEGATIVE
Ketones, ur: 15 mg/dL — AB
Leukocytes, UA: NEGATIVE
Nitrite: NEGATIVE
Protein, ur: NEGATIVE mg/dL
Specific Gravity, Urine: 1.023 (ref 1.005–1.030)
Urobilinogen, UA: 0.2 mg/dL (ref 0.0–1.0)
pH: 5 (ref 5.0–8.0)

## 2013-08-01 LAB — I-STAT TROPONIN, ED: Troponin i, poc: 0.01 ng/mL (ref 0.00–0.08)

## 2013-08-01 LAB — LIPASE, BLOOD: Lipase: 29 U/L (ref 11–59)

## 2013-08-01 LAB — I-STAT CG4 LACTIC ACID, ED: Lactic Acid, Venous: 1.8 mmol/L (ref 0.5–2.2)

## 2013-08-01 LAB — MRSA PCR SCREENING: MRSA by PCR: NEGATIVE

## 2013-08-01 LAB — POC OCCULT BLOOD, ED: Fecal Occult Bld: POSITIVE — AB

## 2013-08-01 LAB — TROPONIN I
Troponin I: <0.3 ng/mL (ref <0.30)
Troponin I: <0.3 ng/mL (ref <0.30)

## 2013-08-01 MED ORDER — SODIUM CHLORIDE 0.9 % IV BOLUS (SEPSIS)
500.0000 mL | Freq: Once | INTRAVENOUS | Status: AC
  Administered 2013-08-01: 500 mL via INTRAVENOUS

## 2013-08-01 MED ORDER — SODIUM CHLORIDE 0.9 % IV SOLN: INTRAVENOUS | Status: DC

## 2013-08-01 MED ORDER — SODIUM CHLORIDE 0.9 % IJ SOLN
3.0000 mL | Freq: Two times a day (BID) | INTRAMUSCULAR | Status: DC
  Administered 2013-08-01 – 2013-08-07 (×10): 3 mL via INTRAVENOUS

## 2013-08-01 MED ORDER — FENTANYL CITRATE 0.05 MG/ML IJ SOLN: 50.0000 ug | Freq: Once | INTRAMUSCULAR | Status: DC

## 2013-08-01 MED ORDER — ONDANSETRON HCL 4 MG/2ML IJ SOLN: 4.0000 mg | Freq: Four times a day (QID) | INTRAMUSCULAR | Status: DC | PRN

## 2013-08-01 MED ORDER — ONDANSETRON HCL 4 MG PO TABS: 4.0000 mg | ORAL_TABLET | Freq: Four times a day (QID) | ORAL | Status: DC | PRN

## 2013-08-01 MED ORDER — SODIUM CHLORIDE 0.9 % IV SOLN
INTRAVENOUS | Status: AC
  Administered 2013-08-01: 15:00:00 via INTRAVENOUS

## 2013-08-01 MED ORDER — SODIUM CHLORIDE 0.9 % IV BOLUS (SEPSIS)
1000.0000 mL | Freq: Once | INTRAVENOUS | Status: AC
  Administered 2013-08-01: 1000 mL via INTRAVENOUS

## 2013-08-01 MED ORDER — PANTOPRAZOLE SODIUM 40 MG IV SOLR
40.0000 mg | Freq: Two times a day (BID) | INTRAVENOUS | Status: DC
  Administered 2013-08-01 – 2013-08-04 (×6): 40 mg via INTRAVENOUS
  Filled 2013-08-01 (×8): qty 40

## 2013-08-01 MED ORDER — FENTANYL CITRATE 0.05 MG/ML IJ SOLN
50.0000 ug | Freq: Once | INTRAMUSCULAR | Status: AC
  Administered 2013-08-01: 50 ug via INTRAVENOUS
  Filled 2013-08-01: qty 2

## 2013-08-01 NOTE — Consult Note (Signed)
[facility] Consult: 2:35 PM 08/01/2013  LOS: 0 days    Referring Provider: Dr  Ellwood Dense Primary Care Physician:  Purvis Kilts, MD  [place] in [place] Primary Gastroenterologist:  Althia Forts  Cardiologist:  Dr Wynonia Lawman.   Reason for Consultation:  Anemia and black stools   HPI: ANTONEO GHRIST is a 63 y.o. male.  On Plavix for cardiac stent of 2005. Extensive spinal stenosis. S/p 3 back surgeries, last was in March, 2015.   [place] Saturday 7/18 developed loose,black stools. persisted every 3 hours or so through today. Last night developed SS chest pressure and doe.  Normally takes a single 325 ASA daily but took an additonal 6 more over the course of several hours after the chest pain arose last night. Felt weak, unable to walk more than 8 feet before feeling sxs.  Earlier in day had felt well and been on a ladder installing a weather station without issues.  Hgb 12.6 initially, down to 10.8 within 6 hours.  Hgb was ~ 14.0 at time of surgery in 03/2013.   Generally good appetite. Previous colonoscopy around age 60 at [facility], but they do not have records in current charting system.  Last Plavix dose was this morning.  No heartburn, indigestion, dysphagia, nausea, anorexia. Is currently NPO and hungry.  No history of liver test issues or liver disease.  Weight stable.     Past Medical History  Diagnosis Date  . Hypertension   . Shortness of breath   . Arthritis     Past Surgical History  Procedure Laterality Date  . Cardiac catheterization      05    1 stent   dr Wynonia Lawman  . Back surgery      x2  . Joint replacement      x4  rt knee, x2lt arthroscopy  . Foot surgery    . Appendectomy      Prior to Admission medications   Medication Sig Start Date End Date Taking? Authorizing  Provider  aspirin EC 325 MG tablet Take 325 mg by mouth daily.   Yes Historical Provider, MD  clopidogrel (PLAVIX) 75 MG tablet Take 75 mg by mouth daily with breakfast.   Yes Historical Provider, MD  diazepam (VALIUM) 5 MG tablet Take 1 tablet (5 mg total) by mouth every 6 (six) hours as needed for muscle spasms. 04/03/13  Yes Eustace Moore, MD  metoprolol tartrate (LOPRESSOR) 25 MG tablet Take 25 mg by mouth daily.    Yes Historical Provider, MD  quinapril-hydrochlorothiazide (ACCURETIC) 20-12.5 MG per tablet Take 1 tablet by mouth daily.   Yes Historical Provider, MD  rosuvastatin (CRESTOR) 40 MG tablet Take 20 mg by mouth daily.    Yes Historical Provider, MD    Scheduled Meds: . sodium chloride   Intravenous STAT  . fentaNYL  50 mcg Intravenous Once  . sodium chloride  3 mL Intravenous Q12H   Infusions:   PRN Meds: ondansetron (ZOFRAN) IV, ondansetron   Allergies as of 08/01/2013 - Review Complete 08/01/2013  Allergen Reaction  Noted  . Adhesive [tape] Other (See Comments) 02/02/2013    Family History No colon polyps or cancer.   History   Social History  . Marital Status: Married    Spouse Name: N/A    Number of Children: N/A  . Years of Education: N/A   Occupational History  . Not on file.   Social History Main Topics  . Smoking status: Never Smoker   . Smokeless tobacco: Not on file  . Alcohol Use: Yes     Drinks up to 4 drinks  Per day about 3 days per week.  Combo of beer, spirits or wine.   . Drug Use: No  . Sexual Activity: Not on file    REVIEW OF SYSTEMS: Constitutional:  Per HPI ENT:  No nose bleeds Pulm:  Per HPI.  basline is dyspnea with long stairclimbs CV:  No palpitations, no LE edema.  GU:  No hematuria, no frequency GI:  Per HPI Heme:  No previous anemia or unusual bleeding   Transfusions:  none Neuro:  No headaches.  Chronic bil numbness in feet an lower legs Derm:  No itching, no rash or sores.  Endocrine:  No sweats or chills.  No  polyuria or dysuria Immunization:  Not queried.  Travel:  None beyond local counties in last few months.    PHYSICAL EXAM: Vital signs in last 24 hours: Filed Vitals:   08/01/13 1302  BP: 112/58  Pulse: 81  Temp: 97.9 F (36.6 C)  Resp: 18   Wt Readings from Last 3 Encounters:  08/01/13 142.2 kg (313 lb 7.9 oz)  03/28/13 148.2 kg (326 lb 11.6 oz)  02/08/13 140.615 kg (310 lb)    General: Morbidly obese WM who is comfortable, does not look ill.  Head:  No swelling, no asymmetry  Eyes:  No iceterus or pallor Ears:  Not HOH  Nose:  No congestion Mouth:  Clear and moist, no vascular lesions Neck:  No mass, no JVD, no TMG Lungs:  Clear bil.  No dyspnea or cough Heart: RRR.  No MRG Abdomen:  Obese, active BS.  Soft, NT.  No mass or HSM.   Rectal: deferred, black and FOBT + in ED   Musc/Skeltl: no joint swelling or contractures Extremities:  No pedal edema  Neurologic:  Feet are numb.  Isolated LE twitch/spasm in right leg.  No gross deficits.  Skin:  No telangectasia.  Tattoos:  none Nodes:  No cervical adenopathy   Psych:  Pleasant, cooperative, fluid speech.   Total I/O In: 1500 [I.V.:1500] Out: -   LAB RESULTS:  Recent Labs  08/01/13 0914 08/01/13 1340  WBC 9.3 7.5  HGB 12.6* 10.8*  HCT 37.6* 32.2*  PLT 243 198   BMET Lab Results  Component Value Date   NA 140 08/01/2013   NA 137 04/01/2013   NA 140 03/28/2013   K 4.3 08/01/2013   K 4.6 04/01/2013   K 4.1 03/28/2013   CL 100 08/01/2013   CL 99 03/28/2013   CL 101 04/30/2010   CO2 23 08/01/2013   CO2 25 03/28/2013   CO2 26 04/30/2010   GLUCOSE 133* 08/01/2013   GLUCOSE 138* 04/01/2013   GLUCOSE 108* 03/28/2013   BUN 45* 08/01/2013   BUN 11 03/28/2013   BUN 9 04/30/2010   CREATININE 0.90 08/01/2013   CREATININE 0.77 03/28/2013   CREATININE 0.86 04/30/2010   CALCIUM 9.5 08/01/2013   CALCIUM 9.3 03/28/2013   CALCIUM 9.1 04/30/2010   LFT  Recent Labs  08/01/13 0914  PROT 7.6  ALBUMIN 4.0  AST 36  ALT 25    ALKPHOS 75  BILITOT 0.9   Lipase     Component Value Date/Time   LIPASE 29 08/01/2013 0914    RADIOLOGY STUDIES: Dg Chest Port 1 View  08/01/2013   CLINICAL DATA:  Chest pain with shortness of breath with history of cardiac stents ; rectal bleeding  EXAM: PORTABLE CHEST - 1 VIEW  COMPARISON:  PA and lateral chest x-ray of March 28, 2013  FINDINGS: The lungs are adequately inflated and clear. The heart and pulmonary vascularity are within the limits of normal. There is no pleural effusion. The mediastinum is normal in width. There is no pleural effusion or pneumothorax. The bony thorax is unremarkable.  IMPRESSION: There is no acute cardiopulmonary abnormality.   Electronically Signed   By: David  [place]   On: 08/01/2013 09:22    ENDOSCOPIC STUDIES: Age ~ 58 colonoscopy by [facility], they do not have records in their current computer system. Pt does not recall any polyps, diverticulosis, etc  IMPRESSION:   *  Upper GI bleed.  No GI sxs. BUN is elevated, adding credence to UGI source.  Rule out PUD, rule out variceal/portal gastropathy source (less likely as no history of liver diseases, LFTs normal).   *  Symptomatic ABL anemia.  Normal MCV.   *  Has ruled out for acute coronary syndrome thus far with negative EKG and initial enzymes negative.     PLAN:     *  Start PPI IV BID.  Check coags. Will do EGD tomorrow, diagnostic study only due to Plavix use.   Azucena Freed  08/01/2013, 2:35 PM Pager: [phone]       Attending physician's note   I have taken a history, examined the patient and reviewed the chart. I agree with the Advanced Practitioner's note, impression and recommendations. Acute GI bleed, presumed UGI source. IV PPI for now. Monitor Hb/Hct. Check coags. Hold Plavix and ASA. EGD tomorrow.   Ladene Artist, MD [facility]

## 2013-08-01 NOTE — Progress Notes (Signed)
Utilization Review Completed.Raffael Bugarin T7/20/2015  

## 2013-08-01 NOTE — H&P (Signed)
Date: 08/01/2013               Patient Name:  Stephen Medina MRN: [id number]  DOB: 1950-03-19 Age / Sex: 63 y.o., male   PCP: Halford Chessman, MD         Medical Service: Internal Medicine Teaching Service         Attending Physician: Dr. Madilyn Fireman, MD    First Contact: Katherene Ponto  Pager: [phone]  Second Contact: Dr. Clinton Gallant  Pager: [phone]       After Hours (After 5p/  First Contact Pager: [phone]  weekends / holidays): Second Contact Pager: [phone]   Chief Complaint: coffee ground stool  History of Present Illness: Stephen Medina is a 64 yo male with PMH of CAD (stent placed in 2005), hypertension, chronic back pain, who presents to the hospital with black tarry stools that started two days ago. He was in his normal state of health until Saturday when he had a large bowel movement that was a "coffee ground" in color. His bms have decreased in size since then but have remained dark in color and occur every ~3 hours. Pt has had mild abdominal discomfort as well as an episode of substernal chest pain last night that did not radiate and lasted for 4-5 minutes in duration. He endorsed associated diaphoresis but no nausea. Since then has had several 5-6 second episodes of stabbing pain just left of his sternum. In addition pt endorses feeling increasingly short of breath over the past couple days, even when walking short distances in his house. He also endorses feeling dizzy and having episodes of chills mixed with flushing.  He takes 325 mgs aspirin daily and plavix 75 mg daily for his CAD. Pt reports taking an addition 325mg  of aspirin at least twice last night for chest pain/ gi discomfort. He denies nausea/vomiting, cough, weight loss, head aches, vision changes and urinary symptoms. He also denies any history of peptic ulcer/GI bleed   On admission to the ED pt had a BP of 107/74, Hr 106, and RR of 26 with Hgb of 12. Hemoccult was positive. His plavix was stopped and he   received 2 L bolus NS. POC troponin was neg and ECG showed tachycardia with TWI in aVL but no other major ECG findings. Chest x-ray was negative  Of note patient has had multiple back surgeries for chronic back pain with associated neuropathies. He had his most recent operation in April which was L4/5 decompression and arthrodesis with pedicle screw fixation. He reports recovering well from procedure.   Meds: Current Facility-Administered Medications  Medication Dose Route Frequency Provider Last Rate Last Dose  . 0.9 %  sodium chloride infusion   Intravenous STAT Neta Ehlers, MD 100 mL/hr at 08/01/13 1446    . 0.9 %  sodium chloride infusion   Intravenous STAT Clinton Gallant, MD      . fentaNYL (SUBLIMAZE) injection 50 mcg  50 mcg Intravenous Once Neta Ehlers, MD      . ondansetron [facility]) tablet 4 mg  4 mg Oral Q6H PRN Clinton Gallant, MD       Or  . ondansetron [facility]) injection 4 mg  4 mg Intravenous Q6H PRN Clinton Gallant, MD      . pantoprazole (PROTONIX) injection 40 mg  40 mg Intravenous Q12H Sarah J Gribbin, PA-C      . sodium chloride 0.9 % injection 3 mL  3 mL Intravenous Q12H Clinton Gallant, MD  3 mL at 08/01/13 1446    Allergies: Allergies as of 08/01/2013 - Review Complete 08/01/2013  Allergen Reaction Noted  . Adhesive [tape] Other (See Comments) 02/02/2013   Past Medical History  Diagnosis Date  . Hypertension   . Shortness of breath   . Arthritis    Past Surgical History  Procedure Laterality Date  . Cardiac catheterization      05    1 stent   dr Wynonia Lawman  . Back surgery      x2  . Joint replacement      x4  rt knee, x2lt arthroscopy  . Foot surgery    . Appendectomy     Family History  Problem Relation Age of Onset  . Coronary artery disease Father     open heart surgery   . CAD Cousin     requiring stent   History   Social History  . Marital Status: Married    Spouse Name: N/A    Number of Children: N/A  . Years of Education: N/A   Occupational  History  . Not on file.   Social History Main Topics  . Smoking status: Never Smoker   . Smokeless tobacco: Not on file  . Alcohol Use: Yes     Comment: weekly  . Drug Use: No  . Sexual Activity: Not on file   Other Topics Concern  . Not on file   Social History Narrative   Pt lives in [place] with his wife. They have one son who does not live in the area. Pt previously worked for [employer] but is retired. He used to [employer]. He has never smoke and drinks 4-5 alcoholic beverages per week. No illicit drug use.    Review of Systems: Negative except for as noted in HPI  Physical Exam: Blood pressure 112/58, pulse 81, temperature 97.9 F (36.6 C), temperature source Oral, resp. rate 18, height 6' (1.829 m), weight 142.2 kg (313 lb 7.9 oz), SpO2 98.00%. General: lying in bed, obese, in no acute distress HEENT: PERRLA, moist mucus membranes, no lymph adenopathy Heart: RRR, normal S1, S2, no murmurs, no pain on palpation of chest. Lungs: Normal work of breathing, CTAB, no wheezes, rhonchi or crackles. Abdominal: Normoactive bowel sounds, non-distended, mildly tender in upper epigastric region Extremities: Trace peripheral edema. Dorsal pedis 2+ bilaterally Skin: No new rashes or ulcers Neuro: A&Ox3, no focal motor deficits   Lab results: Basic Metabolic Panel:  Recent Labs  08/01/13 0914  NA 140  K 4.3  CL 100  CO2 23  GLUCOSE 133*  BUN 45*  CREATININE 0.90  CALCIUM 9.5   Liver Function Tests:  Recent Labs  08/01/13 0914  AST 36  ALT 25  ALKPHOS 75  BILITOT 0.9  PROT 7.6  ALBUMIN 4.0    Recent Labs  08/01/13 0914  LIPASE 29   No results found for this basename: AMMONIA,  in the last 72 hours CBC:  Recent Labs  08/01/13 0914 08/01/13 1340  WBC 9.3 7.5  HGB 12.6* 10.8*  HCT 37.6* 32.2*  MCV 92.8 93.1  PLT 243 198   Cardiac Enzymes:  Recent Labs  08/01/13 1340  TROPONINI <0.30   BNP:  Recent Labs  08/01/13 0903    PROBNP 61.5   D-Dimer: No results found for this basename: DDIMER,  in the last 72 hours CBG: No results found for this basename: GLUCAP,  in the last 72 hours Hemoglobin A1C: No results found for this  basename: HGBA1C,  in the last 72 hours Fasting Lipid Panel: No results found for this basename: CHOL, HDL, LDLCALC, TRIG, CHOLHDL, LDLDIRECT,  in the last 72 hours Thyroid Function Tests: No results found for this basename: TSH, T4TOTAL, FREET4, T3FREE, THYROIDAB,  in the last 72 hours Anemia Panel: No results found for this basename: VITAMINB12, FOLATE, FERRITIN, TIBC, IRON, RETICCTPCT,  in the last 72 hours Coagulation: No results found for this basename: LABPROT, INR,  in the last 72 hours Urine Drug Screen: Drugs of Abuse  No results found for this basename: labopia,  cocainscrnur,  labbenz,  amphetmu,  thcu,  labbarb    Alcohol Level: No results found for this basename: ETH,  in the last 72 hours Urinalysis:  Recent Labs  08/01/13 1451  COLORURINE YELLOW  LABSPEC 1.023  PHURINE 5.0  GLUCOSEU NEGATIVE  HGBUR NEGATIVE  BILIRUBINUR NEGATIVE  KETONESUR 15*  PROTEINUR NEGATIVE  UROBILINOGEN 0.2  NITRITE NEGATIVE  LEUKOCYTESUR NEGATIVE    Imaging results:  Dg Chest Port 1 View  08/01/2013   CLINICAL DATA:  Chest pain with shortness of breath with history of cardiac stents ; rectal bleeding  EXAM: PORTABLE CHEST - 1 VIEW  COMPARISON:  PA and lateral chest x-ray of March 28, 2013  FINDINGS: The lungs are adequately inflated and clear. The heart and pulmonary vascularity are within the limits of normal. There is no pleural effusion. The mediastinum is normal in width. There is no pleural effusion or pneumothorax. The bony thorax is unremarkable.  IMPRESSION: There is no acute cardiopulmonary abnormality.   Electronically Signed   By: David  Martinique   On: 08/01/2013 09:22    Other results: Sinus tachycardia with  new T wave inversion in AVL and possible left axis deviation.  Otherwise no significant ECG changes.   Assessment & Plan by Problem: Principal Problem:   GI bleed Active Problems:   CAD (coronary artery disease)   GIB (gastrointestinal bleeding)  GI bleed w/cough ground stools: Pt was tachycardic with BP of 107/74 on admission, Hgb of 12 BUN 48. S/p 2 L of fluid in ED. He has mild epigastric tenderness on palpation.  Likely upper GI given cough ground color. Differential includes peptic ulcer, gastritis/doudenitis/esophagitis or malignancy. Given acute onset with history of aspirin use, peptic ulcer is high on the differential, although he denies any history of PUD. Pt may have exacerbated bleed by taking aspirin for chest pain. Since fluid bolus, vital signs have been stable. GI was consulted and will have to wait 3-5 days for plavix level to decreased before doing upper endoscopy.  - Admit to step down - continuous telemetry - NS 100 ml/hr - maintain IV access  - Consulted GI, appreciate recs - Clear liquid diet tonight - Strict I&Os - Repeat CBC this afternoon - hold plavix and aspirin. - Protonix 40 mg BID   Chest pain: Initial episode of substernal chest pain, with diaphoresis that improved after 5 minute could likely be cardiac in nature. ECG did show some T wave inversion along with tachycardia, but no other major changes.  Sharp episodes of stabbing pain are less likely cardiac in nature and more likely MSK related. Some of symptoms could be attributed to PUD as this seem. POC trop and Chest x-ray were negative. S/p fentanyl 50 mcg x2 for chest pain.  -Trend troponins -continuous teli -Protonix as above -  CAD: Pt has a history of coronary artery disease with previous stenting in 2005 and had a negative nuclear  perfusion scan two years ago. Per pt he had 95 occulusion of one of his coronary arteries. - hold Plavix and aspirin for now - continuous telemetry as above  Chronic Back Pain- Pt has had several pervious history back surgeries and  reports taking valium and oxycodone for pain occasionally. Currently back pain is stable and no need for pharmacologic management.  Prophylaxis:  -SCDs -No heparin give acute GI bleed  Diet: Clear liquid diet   Dispo: Disposition is deferred at this time, awaiting improvement of current medical problems. Anticipated discharge in approximately 1-2 day(s).   The patient does have a current PCP Halford Chessman, MD) and does need an [facility] hospital follow-up appointment after discharge.  The patient does not have transportation limitations that hinder transportation to clinic appointments.  Signed: Azucena Cecil, Med Student 08/01/2013, 3:53 PM

## 2013-08-01 NOTE — Progress Notes (Signed)
Pt transferred from ED with RN on stretcher and monitor. Pt able to ambulate from stretcher to bed. Pt had a BM at this time, it was dark in appearence.  Pt VSS at this time will continue to monitor. Family and admitting MD at bedside.

## 2013-08-01 NOTE — ED Notes (Signed)
Pt c/o epigastric pain with shortness of breath and diarrhea. Pt also reports diarrhea coffee ground in color. Pt has history of heart stents. Pt with labored breathing.

## 2013-08-01 NOTE — H&P (Addendum)
Date: 08/01/2013               Patient Name:  Stephen Medina MRN: [id number]  DOB: [birth year] Age / Sex: 63 y.o., male   PCP: Halford Chessman, MD              Medical Service: Internal Medicine Teaching Service     I have reviewed the note by Katherene Ponto MS 4 and was present during the interview and physical exam.  Please see below for findings, assessment, and plan.  Chief Complaint: chest pain and hematochezia  History of Present Illness: Pt states that for the last 2 days he has had ongoing loose BM that are dark and coffee colored in nature along with some intermittent generalized abdominal pain. He denies any hematemesis,fevers/chills, nausea or vomiting. The patient then developed some pressure like CP x24 hrs accompanied by diaphoresis and palpitations. He denied any blurry vision, HA, or DOE. He has been recently taking some extra ASA for his abdominal pain and consistently taking his plavix for previously known CAD. Pt does have stent and feels that this CP could be similar but is much shorter and is not really exacerbated by activity. He doesn't have a significant hx of GI problems and has not had any event like this before. He remembers having a colonoscopy when he was 63 yrs old and was normal. No significant family hx of colon cancer or other GI malignancies. No recent travel, sick contacts, or new changes in diet.    Meds: Current Facility-Administered Medications  Medication Dose Route Frequency Provider Last Rate Last Dose  . 0.9 %  sodium chloride infusion   Intravenous STAT Neta Ehlers, MD 100 mL/hr at 08/01/13 1446    . 0.9 %  sodium chloride infusion   Intravenous STAT Sarah J Gribbin, PA-C      . fentaNYL (SUBLIMAZE) injection 50 mcg  50 mcg Intravenous Once Neta Ehlers, MD      . ondansetron [facility]) tablet 4 mg  4 mg Oral Q6H PRN Clinton Gallant, MD       Or  . ondansetron [facility]) injection 4 mg  4 mg Intravenous Q6H PRN Clinton Gallant, MD      . pantoprazole  (PROTONIX) injection 40 mg  40 mg Intravenous Q12H Sarah J Gribbin, PA-C      . sodium chloride 0.9 % injection 3 mL  3 mL Intravenous Q12H Clinton Gallant, MD   3 mL at 08/01/13 1446    Allergies: Allergies as of 08/01/2013 - Review Complete 08/01/2013  Allergen Reaction Noted  . Adhesive [tape] Other (See Comments) 02/02/2013    Past Medical History: Medical Student note reviewed  Family History: Medical Student note reviewed  Social History: Medical Student note reviewed  Surgical History: Medical Student note reviewed  Review of System: Medical Student note reviewed  Physical Exam: Blood pressure 112/58, pulse 81, temperature 97.9 F (36.6 C), temperature source Oral, resp. rate 18, height 6' (1.829 m), weight 313 lb 7.9 oz (142.2 kg), SpO2 98.00%. General: resting in bed HEENT: PERRL, EOMI, no scleral icterus Cardiac: RRR, no rubs, murmurs or gallops Pulm: clear to auscultation bilaterally, moving normal volumes of air Abd: soft, nontender, nondistended, BS present Ext: warm and well perfused, no pedal edema Neuro: alert and oriented X3, cranial nerves II-XII grossly intact  Labs: Reviewed as noted in the Electronic Record  Imaging: Reviewed as noted in the Electronic Record  Other results: EKG: nonspecific ST and T waves  changes, sinus tachycardia, left axis deviation  Assessment & Plan by Problem: # Acute GIB: Pt presents with continued and witnessed hematochezia that was found to be FOBT + in the ED, UA negative for RBC, lactic acid was normal. Hgb baseline is usually 13>>12 (likely concentrated and pt was hypotensive on admit) and also probably doesn't represent true value in setting of ongoing active bleeding. LFtS WNL but elevated BUN likely representing ongoing active bleed.  -cbc q6 h  -NPO in anticipation of EGD -GI consult recs greatly recommended  -clear fluid diet  -stepdown admission  -IVF bolus and then NS @125cc   -SCDs  -PPI BID   # Chest pain:  Pt does have hx of CAD s/p DES 2005 and negative myoview 2013. maybe demand ischemia in setting of acute GIB, no significant ECG changes, CXR normal. Pt no recent Echo but no known hx of CHF.  -hold plavix and ASA  -trend trops  -repeat ECG in AM  #Chronic Back Pain- Pt has had several pervious history back surgeries and reports taking valium and oxycodone for pain occasionally. Currently back pain is stable and no need for pharmacologic management. -morphine 2 mg q3h  Dispo: Disposition is deferred at this time, awaiting improvement of current medical problems. Anticipated discharge in approximately 1-2 day(s).   The patient does have a current PCP Halford Chessman, MD) and does need an [facility] hospital follow-up appointment after discharge.  The patient does not have transportation limitations that hinder transportation to clinic appointments.  Signed: Clinton Gallant, MD 08/01/2013, 4:45 PM

## 2013-08-02 ENCOUNTER — Encounter (HOSPITAL_COMMUNITY): Payer: Self-pay | Admitting: Gastroenterology

## 2013-08-02 ENCOUNTER — Encounter (HOSPITAL_COMMUNITY)
Admission: EM | Disposition: A | Discharge status: Home / Self Care | Payer: Self-pay | Source: Home / Self Care | Attending: Internal Medicine

## 2013-08-02 DIAGNOSIS — K250 Acute gastric ulcer with hemorrhage: Secondary | ICD-10-CM

## 2013-08-02 DIAGNOSIS — G8929 Other chronic pain: Secondary | ICD-10-CM

## 2013-08-02 DIAGNOSIS — I2510 Atherosclerotic heart disease of native coronary artery without angina pectoris: Secondary | ICD-10-CM

## 2013-08-02 DIAGNOSIS — K254 Chronic or unspecified gastric ulcer with hemorrhage: Secondary | ICD-10-CM

## 2013-08-02 DIAGNOSIS — K922 Gastrointestinal hemorrhage, unspecified: Secondary | ICD-10-CM

## 2013-08-02 DIAGNOSIS — M549 Dorsalgia, unspecified: Secondary | ICD-10-CM

## 2013-08-02 DIAGNOSIS — R079 Chest pain, unspecified: Secondary | ICD-10-CM

## 2013-08-02 HISTORY — PX: ESOPHAGOGASTRODUODENOSCOPY: SHX5428

## 2013-08-02 LAB — PROTIME-INR
INR: 1.07 (ref 0.00–1.49)
Prothrombin Time: 13.9 seconds (ref 11.6–15.2)

## 2013-08-02 LAB — CBC
HCT: 26.9 % — ABNORMAL LOW (ref 39.0–52.0)
HCT: 27.1 % — ABNORMAL LOW (ref 39.0–52.0)
HCT: 28.5 % — ABNORMAL LOW (ref 39.0–52.0)
Hemoglobin: 9.1 g/dL — ABNORMAL LOW (ref 13.0–17.0)
Hemoglobin: 9.2 g/dL — ABNORMAL LOW (ref 13.0–17.0)
Hemoglobin: 9.7 g/dL — ABNORMAL LOW (ref 13.0–17.0)
MCH: 31.1 pg (ref 26.0–34.0)
MCH: 31.3 pg (ref 26.0–34.0)
MCH: 31.5 pg (ref 26.0–34.0)
MCHC: 33.8 g/dL (ref 30.0–36.0)
MCHC: 33.9 g/dL (ref 30.0–36.0)
MCHC: 34 g/dL (ref 30.0–36.0)
MCV: 91.6 fL (ref 78.0–100.0)
MCV: 92.4 fL (ref 78.0–100.0)
MCV: 92.5 fL (ref 78.0–100.0)
Platelets: 163 10*3/uL (ref 150–400)
Platelets: 171 10*3/uL (ref 150–400)
Platelets: 189 10*3/uL (ref 150–400)
RBC: 2.91 MIL/uL — ABNORMAL LOW (ref 4.22–5.81)
RBC: 2.96 MIL/uL — ABNORMAL LOW (ref 4.22–5.81)
RBC: 3.08 MIL/uL — ABNORMAL LOW (ref 4.22–5.81)
RDW: 15.5 % (ref 11.5–15.5)
RDW: 15.6 % — ABNORMAL HIGH (ref 11.5–15.5)
RDW: 15.6 % — ABNORMAL HIGH (ref 11.5–15.5)
WBC: 6.1 10*3/uL (ref 4.0–10.5)
WBC: 6.6 10*3/uL (ref 4.0–10.5)
WBC: 7.7 10*3/uL (ref 4.0–10.5)

## 2013-08-02 LAB — COMPREHENSIVE METABOLIC PANEL
ALT: 16 U/L (ref 0–53)
AST: 23 U/L (ref 0–37)
Albumin: 3.3 g/dL — ABNORMAL LOW (ref 3.5–5.2)
Alkaline Phosphatase: 56 U/L (ref 39–117)
Anion gap: 14 (ref 5–15)
BUN: 22 mg/dL (ref 6–23)
CO2: 23 mEq/L (ref 19–32)
Calcium: 8.1 mg/dL — ABNORMAL LOW (ref 8.4–10.5)
Chloride: 102 mEq/L (ref 96–112)
Creatinine, Ser: 0.82 mg/dL (ref 0.50–1.35)
GFR calc Af Amer: >90 mL/min (ref >90)
GFR calc non Af Amer: >90 mL/min (ref >90)
Glucose, Bld: 114 mg/dL — ABNORMAL HIGH (ref 70–99)
Potassium: 3.8 mEq/L (ref 3.7–5.3)
Sodium: 139 mEq/L (ref 137–147)
Total Bilirubin: 0.8 mg/dL (ref 0.3–1.2)
Total Protein: 5.9 g/dL — ABNORMAL LOW (ref 6.0–8.3)

## 2013-08-02 LAB — TROPONIN I: Troponin I: <0.3 ng/mL (ref <0.30)

## 2013-08-02 SURGERY — EGD (ESOPHAGOGASTRODUODENOSCOPY)
Anesthesia: Moderate Sedation

## 2013-08-02 MED ORDER — MIDAZOLAM HCL 10 MG/2ML IJ SOLN
INTRAMUSCULAR | Status: DC | PRN
  Administered 2013-08-02 (×4): 2 mg via INTRAVENOUS

## 2013-08-02 MED ORDER — ACETAMINOPHEN 325 MG PO TABS
650.0000 mg | ORAL_TABLET | Freq: Once | ORAL | Status: AC
  Administered 2013-08-02: 650 mg via ORAL
  Filled 2013-08-02: qty 2

## 2013-08-02 MED ORDER — DIPHENHYDRAMINE HCL 50 MG/ML IJ SOLN
INTRAMUSCULAR | Status: DC | PRN
  Administered 2013-08-02: 25 mg via INTRAVENOUS

## 2013-08-02 MED ORDER — FENTANYL CITRATE 0.05 MG/ML IJ SOLN
INTRAMUSCULAR | Status: DC | PRN
  Administered 2013-08-02 (×4): 25 ug via INTRAVENOUS

## 2013-08-02 MED ORDER — FENTANYL CITRATE 0.05 MG/ML IJ SOLN
INTRAMUSCULAR | Status: AC
  Filled 2013-08-02: qty 2

## 2013-08-02 MED ORDER — MIDAZOLAM HCL 5 MG/ML IJ SOLN
INTRAMUSCULAR | Status: AC
  Filled 2013-08-02: qty 2

## 2013-08-02 MED ORDER — DIPHENHYDRAMINE HCL 50 MG/ML IJ SOLN
INTRAMUSCULAR | Status: AC
  Filled 2013-08-02: qty 1

## 2013-08-02 MED ORDER — SODIUM CHLORIDE 0.9 % IV BOLUS (SEPSIS)
500.0000 mL | Freq: Once | INTRAVENOUS | Status: AC
  Administered 2013-08-02: 500 mL via INTRAVENOUS

## 2013-08-02 MED ORDER — BUTAMBEN-TETRACAINE-BENZOCAINE 2-2-14 % EX AERO
INHALATION_SPRAY | CUTANEOUS | Status: DC | PRN
  Administered 2013-08-02: 2 via TOPICAL

## 2013-08-02 NOTE — Progress Notes (Signed)
  PROGRESS NOTE MEDICINE TEACHING ATTENDING   Day 1 of stay Patient name: Stephen Medina   Medical record number: [id number] Date of birth: 17-Mar-1950    Met with Mr Bedwell. He is back from EGD and has tolerated the procedure well. I have read the HP by Dr Algis Liming and I agree with the management plan. He is feeling fatigued, however his chest pain has resolved.   On exam, he is a obese guy, currently lying in bed, no acute distress, alert and oriented, vitals stable. He has some tenderness to palpation over his epigastric region, however, he has normal BS, and soft abdomen with no distention. He does not have any chest tenderness. His cardiac exam is within normal limits - regular regular heart sounds with no murmurs, his lungs are clear to auscultation. No pedal edema.   I have reviewed the chart notes, labs and images.   Assessment and Plan                                                                       Upper GI bleed secondary to peptic ulcerative pathology  Plan to monitor falling hgb, transfuse if falls below 7.   EGD done - appreciate inputs by Dr Fuller Plan.  Agree with IV PPI for now, can transition to oral as ulcers were not bleeding on EGD. No NSAIDs for now - ok to resume 81 aspirin in 2 weeks if no more bleed.   If stable hgb tomorrow, and no more dark stools, can discharge with immediate clinic follow up for Hgb, if Dr Fuller Plan okay with plan.  Chest pain resolved - MI ruled out, no signficant EKG changes, negative trops. If recurs, advise stress test as outpatient.   I have discussed the care of this patient with my IM team residents. Please see the resident note for details.  [place], [place] 08/02/2013, 2:57 PM.

## 2013-08-02 NOTE — Progress Notes (Signed)
  I have seen and examined the patient myself, and I have reviewed the note by Katherene Ponto, MS 4 and was present during the interview and physical exam.  Please see my separate H&P for additional findings, assessment, and plan.   Signed: Clinton Gallant, MD 08/02/2013, 4:52 PM

## 2013-08-02 NOTE — H&P (View-Only) (Signed)
[facility] Consult: 2:35 PM 08/01/2013  LOS: 0 days    Referring Provider: Dr  Ellwood Dense Primary Care Physician:  Purvis Kilts, MD  [facility] [place] [facility] in [place] Primary Gastroenterologist:  Althia Forts  Cardiologist:  Dr Wynonia Lawman.   Reason for Consultation:  Anemia and black stools   HPI: Stephen Medina is a 63 y.o. male.  On Plavix for cardiac stent of 2005. Extensive spinal stenosis. S/p 3 back surgeries, last was in March, 2015.   Miday Saturday 7/18 developed loose,black stools. persisted every 3 hours or so through today. Last night developed SS chest pressure and doe.  Normally takes a single 325 ASA daily but took an additonal 6 more over the course of several hours after the chest pain arose last night. Felt weak, unable to walk more than 8 feet before feeling sxs.  Earlier in day had felt well and been on a ladder installing a weather station without issues.  Hgb 12.6 initially, down to 10.8 within 6 hours.  Hgb was ~ 14.0 at time of surgery in 03/2013.   Generally good appetite. Previous colonoscopy around age 10 at [place], but they do not have records in current charting system.  Last Plavix dose was this morning.  No heartburn, indigestion, dysphagia, nausea, anorexia. Is currently NPO and hungry.  No history of liver test issues or liver disease.  Weight stable.     Past Medical History  Diagnosis Date  . Hypertension   . Shortness of breath   . Arthritis     Past Surgical History  Procedure Laterality Date  . Cardiac catheterization      05    1 stent   dr Wynonia Lawman  . Back surgery      x2  . Joint replacement      x4  rt knee, x2lt arthroscopy  . Foot surgery    . Appendectomy      Prior to Admission medications   Medication Sig Start Date End Date Taking? Authorizing  Provider  aspirin EC 325 MG tablet Take 325 mg by mouth daily.   Yes Historical Provider, MD  clopidogrel (PLAVIX) 75 MG tablet Take 75 mg by mouth daily with breakfast.   Yes Historical Provider, MD  diazepam (VALIUM) 5 MG tablet Take 1 tablet (5 mg total) by mouth every 6 (six) hours as needed for muscle spasms. 04/03/13  Yes Eustace Moore, MD  metoprolol tartrate (LOPRESSOR) 25 MG tablet Take 25 mg by mouth daily.    Yes Historical Provider, MD  quinapril-hydrochlorothiazide (ACCURETIC) 20-12.5 MG per tablet Take 1 tablet by mouth daily.   Yes Historical Provider, MD  rosuvastatin (CRESTOR) 40 MG tablet Take 20 mg by mouth daily.    Yes Historical Provider, MD    Scheduled Meds: . sodium chloride   Intravenous STAT  . fentaNYL  50 mcg Intravenous Once  . sodium chloride  3 mL Intravenous Q12H   Infusions:   PRN Meds: ondansetron (ZOFRAN) IV, ondansetron   Allergies as of 08/01/2013 - Review Complete 08/01/2013  Allergen Reaction  Noted  . Adhesive [tape] Other (See Comments) 02/02/2013    Family History No colon polyps or cancer.   History   Social History  . Marital Status: Married    Spouse Name: N/A    Number of Children: N/A  . Years of Education: N/A   Occupational History  . Not on file.   Social History Main Topics  . Smoking status: Never Smoker   . Smokeless tobacco: Not on file  . Alcohol Use: Yes     Drinks up to 4 drinks  Per day about 3 days per week.  Combo of beer, spirits or wine.   . Drug Use: No  . Sexual Activity: Not on file    REVIEW OF SYSTEMS: Constitutional:  Per HPI ENT:  No nose bleeds Pulm:  Per HPI.  basline is dyspnea with long stairclimbs CV:  No palpitations, no LE edema.  GU:  No hematuria, no frequency GI:  Per HPI Heme:  No previous anemia or unusual bleeding   Transfusions:  none Neuro:  No headaches.  Chronic bil numbness in feet an lower legs Derm:  No itching, no rash or sores.  Endocrine:  No sweats or chills.  No  polyuria or dysuria Immunization:  Not queried.  Travel:  None beyond local counties in last few months.    PHYSICAL EXAM: Vital signs in last 24 hours: Filed Vitals:   08/01/13 1302  BP: 112/58  Pulse: 81  Temp: 97.9 F (36.6 C)  Resp: 18   Wt Readings from Last 3 Encounters:  08/01/13 142.2 kg (313 lb 7.9 oz)  03/28/13 148.2 kg (326 lb 11.6 oz)  02/08/13 140.615 kg (310 lb)    General: Morbidly obese WM who is comfortable, does not look ill.  Head:  No swelling, no asymmetry  Eyes:  No iceterus or pallor Ears:  Not HOH  Nose:  No congestion Mouth:  Clear and moist, no vascular lesions Neck:  No mass, no JVD, no TMG Lungs:  Clear bil.  No dyspnea or cough Heart: RRR.  No MRG Abdomen:  Obese, active BS.  Soft, NT.  No mass or HSM.   Rectal: deferred, black and FOBT + in ED   Musc/Skeltl: no joint swelling or contractures Extremities:  No pedal edema  Neurologic:  Feet are numb.  Isolated LE twitch/spasm in right leg.  No gross deficits.  Skin:  No telangectasia.  Tattoos:  none Nodes:  No cervical adenopathy   Psych:  Pleasant, cooperative, fluid speech.   Total I/O In: 1500 [I.V.:1500] Out: -   LAB RESULTS:  Recent Labs  08/01/13 0914 08/01/13 1340  WBC 9.3 7.5  HGB 12.6* 10.8*  HCT 37.6* 32.2*  PLT 243 198   BMET Lab Results  Component Value Date   NA 140 08/01/2013   NA 137 04/01/2013   NA 140 03/28/2013   K 4.3 08/01/2013   K 4.6 04/01/2013   K 4.1 03/28/2013   CL 100 08/01/2013   CL 99 03/28/2013   CL 101 04/30/2010   CO2 23 08/01/2013   CO2 25 03/28/2013   CO2 26 04/30/2010   GLUCOSE 133* 08/01/2013   GLUCOSE 138* 04/01/2013   GLUCOSE 108* 03/28/2013   BUN 45* 08/01/2013   BUN 11 03/28/2013   BUN 9 04/30/2010   CREATININE 0.90 08/01/2013   CREATININE 0.77 03/28/2013   CREATININE 0.86 04/30/2010   CALCIUM 9.5 08/01/2013   CALCIUM 9.3 03/28/2013   CALCIUM 9.1 04/30/2010   LFT  Recent Labs  08/01/13 0914  PROT 7.6  ALBUMIN 4.0  AST 36  ALT 25    ALKPHOS 75  BILITOT 0.9   Lipase     Component Value Date/Time   LIPASE 29 08/01/2013 0914    RADIOLOGY STUDIES: Dg Chest Port 1 View  08/01/2013   CLINICAL DATA:  Chest pain with shortness of breath with history of cardiac stents ; rectal bleeding  EXAM: PORTABLE CHEST - 1 VIEW  COMPARISON:  PA and lateral chest x-ray of March 28, 2013  FINDINGS: The lungs are adequately inflated and clear. The heart and pulmonary vascularity are within the limits of normal. There is no pleural effusion. The mediastinum is normal in width. There is no pleural effusion or pneumothorax. The bony thorax is unremarkable.  IMPRESSION: There is no acute cardiopulmonary abnormality.   Electronically Signed   By: David  Martinique   On: 08/01/2013 09:22    ENDOSCOPIC STUDIES: Age ~ 18 colonoscopy by [facility], they do not have records in their current computer system. Pt does not recall any polyps, diverticulosis, etc  IMPRESSION:   *  Upper GI bleed.  No GI sxs. BUN is elevated, adding credence to UGI source.  Rule out PUD, rule out variceal/portal gastropathy source (less likely as no history of liver diseases, LFTs normal).   *  Symptomatic ABL anemia.  Normal MCV.   *  Has ruled out for acute coronary syndrome thus far with negative EKG and initial enzymes negative.     PLAN:     *  Start PPI IV BID.  Check coags. Will do EGD tomorrow, diagnostic study only due to Plavix use.   Azucena Freed  08/01/2013, 2:35 PM Pager: [phone]       Attending physician's note   I have taken a history, examined the patient and reviewed the chart. I agree with the Advanced Practitioner's note, impression and recommendations. Acute GI bleed, presumed UGI source. IV PPI for now. Monitor Hb/Hct. Check coags. Hold Plavix and ASA. EGD tomorrow.   Ladene Artist, MD Marval Regal

## 2013-08-02 NOTE — Interval H&P Note (Signed)
History and Physical Interval Note:  08/02/2013 9:55 AM  Stephen Medina  has presented today for surgery, with the diagnosis of gi bleed anemia.  The various methods of treatment have been discussed with the patient and family. After consideration of risks, benefits and other options for treatment, the patient has consented to  Procedure(s): ESOPHAGOGASTRODUODENOSCOPY (EGD) (N/A) as a surgical intervention .  The patient's history has been reviewed, patient examined, no change in status, stable for surgery.  I have reviewed the patient's chart and labs.  Questions were answered to the patient's satisfaction.     Pricilla Riffle. Fuller Plan MD

## 2013-08-02 NOTE — H&P (Signed)
I have reviewed the note by Dr Algis Liming and I agree with her HPI, exam and management. I have seen and evaluated the patient, and please see my note from today for the details.

## 2013-08-02 NOTE — Progress Notes (Signed)
Subjective: Pt reports improvement in pain overnight. His BMs have decreased in frequency, but are still dark in color. Pt denies current chest pain. He had his EGD this morning   Objective: Vital signs in last 24 hours: Filed Vitals:   08/02/13 0046 08/02/13 0437 08/02/13 0730 08/02/13 0907  BP: 113/64 125/54 124/60 171/92  Pulse: 84 81 77 75  Temp: 97.9 F (36.6 C) 98.3 F (36.8 C) 98.1 F (36.7 C) 97.9 F (36.6 C)  TempSrc: Oral Oral Oral Oral  Resp: 89 16 15 15   Height:      Weight:      SpO2: 97% 96% 98% 97%   Weight change:   Intake/Output Summary (Last 24 hours) at 08/02/13 0936 Last data filed at 08/02/13 0700  Gross per 24 hour  Intake 2964.58 ml  Output    550 ml  Net 2414.58 ml    General: lying in bed, obese, in no acute distress HEENT: PERRLA, moist mucus membranes, no lymph adenopathy  Heart: RRR, normal S1, S2, no murmurs, no pain on palpation of chest.  Lungs: Normal work of breathing, CTAB, no wheezes, rhonchi or crackles.  Abdominal: Normoactive bowel sounds, non-distended, mildly tender in upper epigastric region   Skin: No new rashes or ulcers  Neuro: A&Ox3, no focal motor deficits  Lab Results: Basic Metabolic Panel:  Recent Labs  08/01/13 0914 08/02/13 0700  NA 140 139  K 4.3 3.8  CL 100 102  CO2 23 23  GLUCOSE 133* 114*  BUN 45* 22  CREATININE 0.90 0.82  CALCIUM 9.5 8.1*   Liver Function Tests:  Recent Labs  08/01/13 0914 08/02/13 0700  AST 36 23  ALT 25 16  ALKPHOS 75 56  BILITOT 0.9 0.8  PROT 7.6 5.9*  ALBUMIN 4.0 3.3*    Recent Labs  08/01/13 0914  LIPASE 29   No results found for this basename: AMMONIA,  in the last 72 hours CBC:  Recent Labs  08/02/13 0036 08/02/13 0700  WBC 7.7 6.1  HGB 9.7* 9.2*  HCT 28.5* 27.1*  MCV 92.5 91.6  PLT 171 163   Cardiac Enzymes:  Recent Labs  08/01/13 1340 08/01/13 1840 08/02/13 0036  TROPONINI <0.30 <0.30 <0.30   BNP:  Recent Labs  08/01/13 0903  PROBNP  61.5   Coagulation:  Recent Labs  08/02/13 0700  LABPROT 13.9  INR 1.07   Urinalysis:  Recent Labs  08/01/13 1451  COLORURINE YELLOW  LABSPEC 1.023  PHURINE 5.0  GLUCOSEU NEGATIVE  HGBUR NEGATIVE  BILIRUBINUR NEGATIVE  KETONESUR 15*  PROTEINUR NEGATIVE  UROBILINOGEN 0.2  NITRITE NEGATIVE  LEUKOCYTESUR NEGATIVE    Micro Results: Recent Results (from the past 240 hour(s))  MRSA PCR SCREENING     Status: None   Collection Time    08/01/13  2:30 PM      Result Value Ref Range Status   MRSA by PCR NEGATIVE  NEGATIVE Final   Comment:            The GeneXpert MRSA Assay (FDA     approved for NASAL specimens     only), is one component of a     comprehensive MRSA colonization     surveillance program. It is not     intended to diagnose MRSA     infection nor to guide or     monitor treatment for     MRSA infections.   Studies/Results: Dg Chest Port 1 View  08/01/2013   CLINICAL  DATA:  Chest pain with shortness of breath with history of cardiac stents ; rectal bleeding  EXAM: PORTABLE CHEST - 1 VIEW  COMPARISON:  PA and lateral chest x-ray of March 28, 2013  FINDINGS: The lungs are adequately inflated and clear. The heart and pulmonary vascularity are within the limits of normal. There is no pleural effusion. The mediastinum is normal in width. There is no pleural effusion or pneumothorax. The bony thorax is unremarkable.  IMPRESSION: There is no acute cardiopulmonary abnormality.   Electronically Signed   By: David  Martinique   On: 08/01/2013 09:22   Medications: I have reviewed the patient's current medications. Scheduled Meds: . [MAR HOLD] sodium chloride   Intravenous STAT  . [facility] HOLD] fentaNYL  50 mcg Intravenous Once  . [MAR HOLD] pantoprazole (PROTONIX) IV  40 mg Intravenous Q12H  . [facility] HOLD] sodium chloride  3 mL Intravenous Q12H   Continuous Infusions:  PRN Meds:.[MAR HOLD] ondansetron (ZOFRAN) IV, [MAR HOLD] ondansetron Assessment/Plan: Principal Problem:    GI bleed Active Problems:   CAD (coronary artery disease)   GIB (gastrointestinal bleeding)   Acute posthemorrhagic anemia   GI bleed w/cough ground stools: Pt was tachycardic with BP of 107/74 on admission, Hgb of 12 BUN 48. S/p 2 L of fluid in ED. He has mild epigastric tenderness on palpation. Likely upper GI given coffee ground color. Pt had EGD today that showed two non-bleeding ulcers on the posterior wall of the pharynx. Did not take any biopsies as patient has been on plavix. Since fluid bolus in ED vital signs have been stable. He will need a repeat endoscopy in 2-3 months to evaluate ulcer healing and rule out malignancy. - Avoid ASA/NSAIDs, plavix for 2 weeks. Then ok to resume ASA 81 mg - f/u H pylori antibody - d/c IV fluids - maintain IV access   - heart healthy diet  - Strict I&Os  - q 8 hour CBC  - Protonix 40 mg BID (continue PPI BID for 4 weeks. Then PPI once a day long term) - schedule f/u GI endoscopy for 2-3 months    Chest pain: Initial episode of substernal chest pain, with diaphoresis that improved after 5 minute could likely be cardiac in nature given stress 2/2 to blood loss. ECG did show some T wave inversion along with tachycardia, but no other major changes. Repeat ECG this am with no changes. Sharp episodes of stabbing pain are less likely cardiac in nature and more likely MSK related. Some of symptoms could be attributed to PUD as this seem. Trop x 3 were negative. S/p fentanyl 50 mcg x2 for chest pain.  -continuous teli   CAD: Pt has a history of coronary artery disease with previous stenting in 2005 and had a negative nuclear perfusion scan two years ago. Per pt he had 95 occulusion of one of his coronary arteries.  - hold Plavix and aspirin for now  - continuous telemetry as above   Chronic Back Pain- Pt has had several pervious history back surgeries and reports taking valium and oxycodone for pain occasionally. Currently back pain is stable and no need  for pharmacologic management.   Prophylaxis:  -SCDs  -No heparin give acute GI bleed   Diet: Clear liquid diet   Dispo: Disposition is deferred at this time, awaiting improvement of current medical problems. Anticipated discharge in approximately 1-2 day(s).   The patient does have a current PCP Halford Chessman, MD) and does need an [facility] hospital  follow-up appointment after discharge.   The patient does not have transportation limitations that hinder transportation to clinic appointments.   .Services Needed at time of discharge: Y = Yes, Blank = No PT:   OT:   RN:   Equipment:   Other:     LOS: 1 day   Azucena Cecil, Med Student 08/02/2013, 9:36 AM

## 2013-08-02 NOTE — Op Note (Signed)
[facility] [place], [id number]   ENDOSCOPY PROCEDURE REPORT  PATIENT: Medina, Stephen  MR#: [id number] BIRTHDATE: 1950/06/06 , 63  yrs. old GENDER: Male ENDOSCOPIST: Ladene Artist, MD, [facility] REFERRED BY:  [facility] PROCEDURE DATE:  08/02/2013 PROCEDURE:  EGD, diagnostic ASA CLASS:     Class III INDICATIONS:  Melena. MEDICATIONS: These medications were titrated to patient response per physician's verbal order, Fentanyl 100 mcg IV, Versed 8 mg IV, and Diphenhydramine (Benadryl) 25 mg IV TOPICAL ANESTHETIC: Cetacaine Spray DESCRIPTION OF PROCEDURE: After the risks benefits and alternatives of the procedure were thoroughly explained, informed consent was obtained.  The Pentax Gastroscope [id number] endoscope was introduced through the mouth and advanced to the second portion of the duodenum. Without limitations.  The instrument was slowly withdrawn as the mucosa was fully examined.    STOMACH: Two non-bleeding clean-based ulcers, measuring 10 x 12 mm and 4 x 4 mm, were found on the posterior wall of the gastric antrum.   The stomach otherwise appeared normal. ESOPHAGUS: The mucosa of the esophagus appeared normal. DUODENUM: The duodenal mucosa showed no abnormalities in the bulb and second portion of the duodenum.  Retroflexed views revealed no abnormalities.   The scope was then withdrawn from the patient and the procedure completed.  COMPLICATIONS: There were no complications.  ENDOSCOPIC IMPRESSION: 1.   Two non-bleeding ulcers on the posterior wall of the gastric antrum 2.   The EGD was otherwise appeared normal  RECOMMENDATIONS: 1.  Avoid ASA/NSAIDS for 2 week then OK to resume ASA 81 mg daily as indicated 2.  PPI BID for 4 weeks then PPI qam long term 3.  Serum H. pylori Ab, treat if positive 4.  Endoscopy in 2-3 months to evaluate for ulcer healing  eSigned:  Ladene Artist, MD, [facility] 08/02/2013 10:30  AM

## 2013-08-02 NOTE — Progress Notes (Signed)
Subjective: NAEON. Pt still having some stool outpt that are black in nature. Pt Hgb continues to drop although CP has resolved. Slightly anxious about procedure this AM.   Objective: Vital signs in last 24 hours: Filed Vitals:   08/02/13 1020 08/02/13 1023 08/02/13 1025 08/02/13 1030  BP: 160/38 144/51 144/51 129/51  Pulse: 85 75 73 72  Temp:      TempSrc:      Resp: 18 14 14 13   Height:      Weight:      SpO2: 99% 100% 100% 100%   Weight change:   Intake/Output Summary (Last 24 hours) at 08/02/13 1043 Last data filed at 08/02/13 0700  Gross per 24 hour  Intake 2964.58 ml  Output    550 ml  Net 2414.58 ml   General: resting in bed  HEENT: PERRL, EOMI, no scleral icterus  Cardiac: RRR, no rubs, murmurs or gallops  Pulm: clear to auscultation bilaterally, moving normal volumes of air  Abd: soft, nontender, nondistended, BS present  Ext: warm and well perfused, no pedal edema  Neuro: alert and oriented X3, cranial nerves II-XII grossly intact  Lab Results: Basic Metabolic Panel:  Recent Labs Lab 08/01/13 0914 08/02/13 0700  NA 140 139  K 4.3 3.8  CL 100 102  CO2 23 23  GLUCOSE 133* 114*  BUN 45* 22  CREATININE 0.90 0.82  CALCIUM 9.5 8.1*   Liver Function Tests:  Recent Labs Lab 08/01/13 0914 08/02/13 0700  AST 36 23  ALT 25 16  ALKPHOS 75 56  BILITOT 0.9 0.8  PROT 7.6 5.9*  ALBUMIN 4.0 3.3*    Recent Labs Lab 08/01/13 0914  LIPASE 29   CBC:  Recent Labs Lab 08/02/13 0036 08/02/13 0700  WBC 7.7 6.1  HGB 9.7* 9.2*  HCT 28.5* 27.1*  MCV 92.5 91.6  PLT 171 163   Cardiac Enzymes:  Recent Labs Lab 08/01/13 1340 08/01/13 1840 08/02/13 0036  TROPONINI <0.30 <0.30 <0.30   BNP:  Recent Labs Lab 08/01/13 0903  PROBNP 61.5   Coagulation:  Recent Labs Lab 08/02/13 0700  LABPROT 13.9  INR 1.07   Urinalysis:  Recent Labs Lab 08/01/13 1451  COLORURINE YELLOW  LABSPEC 1.023  PHURINE 5.0  GLUCOSEU NEGATIVE  HGBUR  NEGATIVE  BILIRUBINUR NEGATIVE  KETONESUR 15*  PROTEINUR NEGATIVE  UROBILINOGEN 0.2  NITRITE NEGATIVE  LEUKOCYTESUR NEGATIVE   Micro Results: Recent Results (from the past 240 hour(s))  MRSA PCR SCREENING     Status: None   Collection Time    08/01/13  2:30 PM      Result Value Ref Range Status   MRSA by PCR NEGATIVE  NEGATIVE Final   Comment:            The GeneXpert MRSA Assay (FDA     approved for NASAL specimens     only), is one component of a     comprehensive MRSA colonization     surveillance program. It is not     intended to diagnose MRSA     infection nor to guide or     monitor treatment for     MRSA infections.   Studies/Results: Dg Chest Port 1 View  08/01/2013   CLINICAL DATA:  Chest pain with shortness of breath with history of cardiac stents ; rectal bleeding  EXAM: PORTABLE CHEST - 1 VIEW  COMPARISON:  PA and lateral chest x-ray of March 28, 2013  FINDINGS: The lungs are adequately inflated  and clear. The heart and pulmonary vascularity are within the limits of normal. There is no pleural effusion. The mediastinum is normal in width. There is no pleural effusion or pneumothorax. The bony thorax is unremarkable.  IMPRESSION: There is no acute cardiopulmonary abnormality.   Electronically Signed   By: David  Martinique   On: 08/01/2013 09:22   Medications: I have reviewed the patient's current medications. Scheduled Meds: . [MAR HOLD] sodium chloride   Intravenous STAT  . [facility] HOLD] fentaNYL  50 mcg Intravenous Once  . [MAR HOLD] pantoprazole (PROTONIX) IV  40 mg Intravenous Q12H  . [facility] HOLD] sodium chloride  3 mL Intravenous Q12H   Continuous Infusions:  PRN Meds:.[MAR HOLD] ondansetron (ZOFRAN) IV, [MAR HOLD] ondansetron Assessment/Plan: # Acute GIB: Pt presents with continued and witnessed hematochezia that was found to be FOBT + in the ED, UA negative for RBC, lactic acid was normal. Hgb baseline is usually 13>>12 (likely concentrated and pt was hypotensive on  admit) and also probably doesn't represent true value in setting of ongoing active bleeding. LFtS WNL but elevated BUN likely representing ongoing active bleed.  -cbc q12h -NPO in anticipation of EGD to be completed on 7/21 -GI consult recs greatly recommended  -stepdown admission  -IVF bolus and then NS @125cc   -SCDs  -PPI BID   # Chest pain: Pt does have hx of CAD s/p DES 2005 and negative myoview 2013. maybe demand ischemia in setting of acute GIB, no significant ECG changes, CXR normal. Pt no recent Echo but no known hx of CHF.  -hold plavix and ASA until EGD completed -trend trops  -repeat ECG in AM   #Chronic Back Pain- Pt has had several pervious history back surgeries and reports taking valium and oxycodone for pain occasionally. Currently back pain is stable and no need for pharmacologic management.  -morphine 2 mg q3h   Dispo: Disposition is deferred at this time, awaiting improvement of current medical problems.  Anticipated discharge in approximately 1-2 day(s).   The patient does have a current PCP (Halford Chessman, MD) and does not need an [facility] hospital follow-up appointment after discharge.  The patient does not have transportation limitations that hinder transportation to clinic appointments.  .Services Needed at time of discharge: Y = Yes, Blank = No PT:   OT:   RN:   Equipment:   Other:     LOS: 1 day   Clinton Gallant, MD 08/02/2013, 10:43 AM

## 2013-08-03 ENCOUNTER — Encounter (HOSPITAL_COMMUNITY): Payer: Self-pay | Admitting: Gastroenterology

## 2013-08-03 LAB — CBC
HCT: 25.3 % — ABNORMAL LOW (ref 39.0–52.0)
HCT: 27.4 % — ABNORMAL LOW (ref 39.0–52.0)
Hemoglobin: 8.6 g/dL — ABNORMAL LOW (ref 13.0–17.0)
Hemoglobin: 9 g/dL — ABNORMAL LOW (ref 13.0–17.0)
MCH: 31.7 pg (ref 26.0–34.0)
MCH: 31 pg (ref 26.0–34.0)
MCHC: 34 g/dL (ref 30.0–36.0)
MCHC: 32.8 g/dL (ref 30.0–36.0)
MCV: 93.4 fL (ref 78.0–100.0)
MCV: 94.5 fL (ref 78.0–100.0)
Platelets: 165 10*3/uL (ref 150–400)
Platelets: 164 10*3/uL (ref 150–400)
RBC: 2.71 MIL/uL — ABNORMAL LOW (ref 4.22–5.81)
RBC: 2.9 MIL/uL — ABNORMAL LOW (ref 4.22–5.81)
RDW: 15.5 % (ref 11.5–15.5)
RDW: 15.4 % (ref 11.5–15.5)
WBC: 4.5 10*3/uL (ref 4.0–10.5)
WBC: 5.2 10*3/uL (ref 4.0–10.5)

## 2013-08-03 LAB — TROPONIN I: Troponin I: <0.3 ng/mL (ref <0.30)

## 2013-08-03 LAB — H. PYLORI ANTIBODY, IGG: H Pylori IgG: <0.4 {ISR}

## 2013-08-03 MED ORDER — GI COCKTAIL ~~LOC~~
30.0000 mL | Freq: Once | ORAL | Status: AC
  Administered 2013-08-03: 30 mL via ORAL
  Filled 2013-08-03: qty 30

## 2013-08-03 MED ORDER — GI COCKTAIL ~~LOC~~
30.0000 mL | Freq: Two times a day (BID) | ORAL | Status: DC | PRN
  Filled 2013-08-03: qty 30

## 2013-08-03 MED ORDER — NITROGLYCERIN 0.4 MG SL SUBL
SUBLINGUAL_TABLET | SUBLINGUAL | Status: AC
  Administered 2013-08-03: 0.4 mg
  Filled 2013-08-03: qty 1

## 2013-08-03 MED ORDER — ALPRAZOLAM 0.25 MG PO TABS: 0.2500 mg | ORAL_TABLET | Freq: Three times a day (TID) | ORAL | Status: DC | PRN

## 2013-08-03 NOTE — Progress Notes (Signed)
Pt tx [facility] [place] per MD order, report called to receiving RN, all questions answered, pt VSS, pt verbalized understanding of tx

## 2013-08-03 NOTE — Progress Notes (Signed)
Subjective: Pt received 500 ml NS bolus for BP of 95/51 this morning. Recovering well from EGD yesterday that showed 2 Peptic ulcers.  His Hgb is now 8.6. Pt has not had bm since his procedure yesterday.  He has been tolerating solid food. Pt noted three episodes of substernal chest discomfort this morning that lasted ~10 seconds and one of which was associated with left arm numbness  Objective: Vital signs in last 24 hours: Filed Vitals:   08/02/13 2001 08/02/13 2322 08/03/13 0353 08/03/13 0751  BP: 96/51 103/52 111/56   Pulse: 81 79 69   Temp: 98.1 F (36.7 C) 97.8 F (36.6 C) 98.3 F (36.8 C) 97.3 F (36.3 C)  TempSrc: Oral Oral Oral Oral  Resp: 18 20 10    Height:      Weight:      SpO2: 97% 97% 97%    Weight change:   Intake/Output Summary (Last 24 hours) at 08/03/13 0852 Last data filed at 08/02/13 2338  Gross per 24 hour  Intake    980 ml  Output      0 ml  Net    980 ml    General: lying in bed, obese, in no acute distress HEENT: PERRLA, moist mucus membranes, no lymph adenopathy  Heart: RRR, normal S1, S2, no murmurs, no pain on palpation of chest.  Lungs: Normal work of breathing, CTAB, no wheezes, rhonchi or crackles.  Abdominal: Normoactive bowel sounds, non-distended, moderately tender suprapubic area. Skin: No new rashes or ulcers  Neuro: A&Ox3, no focal motor deficits  Lab Results: Basic Metabolic Panel:  Recent Labs  08/01/13 0914 08/02/13 0700  NA 140 139  K 4.3 3.8  CL 100 102  CO2 23 23  GLUCOSE 133* 114*  BUN 45* 22  CREATININE 0.90 0.82  CALCIUM 9.5 8.1*   Liver Function Tests:  Recent Labs  08/01/13 0914 08/02/13 0700  AST 36 23  ALT 25 16  ALKPHOS 75 56  BILITOT 0.9 0.8  PROT 7.6 5.9*  ALBUMIN 4.0 3.3*    Recent Labs  08/01/13 0914  LIPASE 29   No results found for this basename: AMMONIA,  in the last 72 hours CBC:  Recent Labs  08/02/13 1910 08/03/13 0756  WBC 6.6 4.5  HGB 9.1* 8.6*  HCT 26.9* 25.3*  MCV  92.4 93.4  PLT 189 165   Cardiac Enzymes:  Recent Labs  08/01/13 1340 08/01/13 1840 08/02/13 0036  TROPONINI <0.30 <0.30 <0.30   BNP:  Recent Labs  08/01/13 0903  PROBNP 61.5   Coagulation:  Recent Labs  08/02/13 0700  LABPROT 13.9  INR 1.07   Urinalysis:  Recent Labs  08/01/13 1451  COLORURINE YELLOW  LABSPEC 1.023  PHURINE 5.0  GLUCOSEU NEGATIVE  HGBUR NEGATIVE  BILIRUBINUR NEGATIVE  KETONESUR 15*  PROTEINUR NEGATIVE  UROBILINOGEN 0.2  NITRITE NEGATIVE  LEUKOCYTESUR NEGATIVE    Micro Results: Recent Results (from the past 240 hour(s))  MRSA PCR SCREENING     Status: None   Collection Time    08/01/13  2:30 PM      Result Value Ref Range Status   MRSA by PCR NEGATIVE  NEGATIVE Final   Comment:            The GeneXpert MRSA Assay (FDA     approved for NASAL specimens     only), is one component of a     comprehensive MRSA colonization     surveillance program. It is not  intended to diagnose MRSA     infection nor to guide or     monitor treatment for     MRSA infections.   Studies/Results: Dg Chest Port 1 View  08/01/2013   CLINICAL DATA:  Chest pain with shortness of breath with history of cardiac stents ; rectal bleeding  EXAM: PORTABLE CHEST - 1 VIEW  COMPARISON:  PA and lateral chest x-ray of March 28, 2013  FINDINGS: The lungs are adequately inflated and clear. The heart and pulmonary vascularity are within the limits of normal. There is no pleural effusion. The mediastinum is normal in width. There is no pleural effusion or pneumothorax. The bony thorax is unremarkable.  IMPRESSION: There is no acute cardiopulmonary abnormality.   Electronically Signed   By: David  Martinique   On: 08/01/2013 09:22   Medications: I have reviewed the patient's current medications. Scheduled Meds: . fentaNYL  50 mcg Intravenous Once  . pantoprazole (PROTONIX) IV  40 mg Intravenous Q12H  . sodium chloride  3 mL Intravenous Q12H   Continuous Infusions:  PRN  Meds:.ondansetron (ZOFRAN) IV, ondansetron Assessment/Plan: Principal Problem:   GI bleed Active Problems:   CAD (coronary artery disease)   GIB (gastrointestinal bleeding)   Acute posthemorrhagic anemia   Gastric ulcer with hemorrhage   GI bleed w/coffee ground stools: Pt was tachycardic with BP of 107/74 on admission, Hgb of 12 BUN 48. S/p 2 L of fluid in ED. He had mild epigastric tenderness on palpation. Likely upper GI given coffee ground color. Pt had EGD today that showed two non-bleeding ulcers on the posterior wall of the pharynx. Did not take any biopsies as patient has been on plavix. He will need a repeat endoscopy in 2-3 months to evaluate ulcer healing and rule out malignancy. Required bolus of fluid last night for boarder line hypotension and Hgb continues to drop 12->10->9.2->8.6. We will need to watch Hgb and BP overnight before discharge. If Hgb continues to trend down, will need to rule out other case of GI bleed. - touch base with GI about further imaging - Avoid ASA/NSAIDs for two weeks. Then ok to resume ASA 81 mg -continue to hold Plavix  - f/u H pylori antibody - maintain IV access   - heart healthy diet  - Strict I&Os  - q 12 hour CBC  - Protonix 40 mg BID (continue PPI BID for 4 weeks. Then PPI once a day long term) - schedule f/u GI endoscopy for 2-3 months -transfer to floor today    Chest pain: Initial episode of substernal chest pain, with diaphoresis that improved after 5 minute could likely be cardiac in nature given stress 2/2 to blood loss. Admission ECG did show some T wave inversion along with tachycardia, but no other major changes. Sharp episodes of stabbing pain are less likely cardiac in nature and more likely MSK related. Some of symptoms could be attributed to his PUD. Trop x 3 were negative. S/p fentanyl 50 mcg x2 for chest pain. Pt had 3 more episodes of chest pain this morning with associated left arm numbness.  Repeat troponin was negative. ECG  showed 1st degree AV block with nonspecific T-wave inversions in V1, but nothing concerning for acute MI.  -continuous teli - repeat ECG with episode of pain  CAD: Pt has a history of coronary artery disease with previous stenting in 2005 and had a negative nuclear perfusion scan two years ago. Per pt he had 95 occulusion of one of his coronary  arteries.  - hold Plavix and aspirin for now  - continuous telemetry as above   Chronic Back Pain- Pt has had several pervious history back surgeries and reports taking valium and oxycodone for pain occasionally. Currently back pain is stable and no need for pharmacologic management.   Prophylaxis:  -SCDs  -No heparin give acute GI bleed   Diet: Regular diet   Dispo: Disposition is deferred at this time, awaiting improvement of current medical problems. Anticipated discharge in approximately 1-2 day(s).   The patient does have a current PCP Halford Chessman, MD) and does need an [facility] hospital follow-up appointment after discharge.   The patient does not have transportation limitations that hinder transportation to clinic appointments.   .Services Needed at time of discharge: Y = Yes, Blank = No PT:   OT:   RN:   Equipment:   Other:     LOS: 2 days   Azucena Cecil, Med Student 08/03/2013, 8:52 AM

## 2013-08-03 NOTE — Progress Notes (Signed)
RN called to pt's room, pt c/o CP 9/10, EKG done per protocol, BP stable, 1 Nitro given, pt stated CP stopped, MD/N

## 2013-08-03 NOTE — Progress Notes (Addendum)
  PROGRESS NOTE MEDICINE TEACHING ATTENDING   Day 2 of stay Patient name: Stephen Medina   Medical record number: [id number] Date of birth: 07-16-1950   Mr Stephen Medina complained of repeat bouts of chest pain this morning which lasted about 10 seconds and he felt his left arm go numb during one of the incidents. He also had a one time low BP at 8pm last night 96/51, and complains of lower abdominal pain today. He denies dysuria. He has not had any more dark BMs since before EGD. His Hgb has decreased to 8.6. His exam is significant for lower abdominal tenderness to palpation.He has no chest tenderness.  Assessment and Plan                                                                       We continue to follow hgb until it stabilizes, transfuse if falls below 7 and look for additional causes for bleeding. The lower abdominal pain, with lower BPs post procedure day - all suspicious of intra-abdominal bleed. If the next hgB continues to fall, or pain increases, we will image the belly. The patient will stay overnight for observation. For chest pain, we repeat EKG and troponin.   I met and evaluated Mr Stephen Medina during the morning rounds with my resident team. I have reviewed the chart, labs, imaging and notes. I have discussed the care of this patient with my IM team residents - Drs Burman Freestone and [facility]. Please see the resident note from today for details of management.   [place], [place] 08/03/2013, 11:28 AM.

## 2013-08-03 NOTE — ED Provider Notes (Signed)
CSN: [id number]     Arrival date & time 08/01/13  5747 History   First MD Initiated Contact with Patient 08/01/13 [phone]     Chief Complaint  Patient presents with  . Chest Pain  . Shortness of Breath  . Rectal Bleeding     (Consider location/radiation/quality/duration/timing/severity/associated sxs/prior Treatment) Patient is a 63 y.o. male presenting with chest pain, shortness of breath, and hematochezia. The history is provided by the patient. No language interpreter was used.  Chest Pain Pain location:  Substernal area Pain quality: pressure   Pain radiates to:  Does not radiate Pain radiates to the back: no   Pain severity:  Moderate Onset quality:  Sudden Timing:  Intermittent Progression:  Waxing and waning Chronicity:  New Context: at rest   Relieved by:  Nothing Worsened by:  Nothing tried Ineffective treatments:  None tried Associated symptoms: diaphoresis, nausea and shortness of breath   Associated symptoms: no abdominal pain, no back pain, no cough, no dizziness, no dysphagia, no fatigue, no fever, no headache, no numbness, not vomiting and no weakness   Associated symptoms comment:  4-5 dark tarry stools since 2am  Risk factors: coronary artery disease, hypertension, male sex and obesity   Shortness of Breath Associated symptoms: chest pain and diaphoresis   Associated symptoms: no abdominal pain, no cough, no fever, no headaches, no rash and no vomiting   Rectal Bleeding Quality:  Black and tarry Amount:  Moderate Duration:  1 day Timing:  Intermittent Progression:  Waxing and waning Chronicity:  New Similar prior episodes: no   Relieved by:  Nothing Worsened by:  Nothing tried Ineffective treatments:  None tried Associated symptoms: no abdominal pain, no dizziness, no fever and no vomiting   Risk factors: anticoagulant use (plavix)     Past Medical History  Diagnosis Date  . Hypertension   . Shortness of breath   . Arthritis    Past Surgical  History  Procedure Laterality Date  . Cardiac catheterization      05    1 stent   dr Wynonia Lawman  . Back surgery      x2  . Joint replacement      x4  rt knee, x2lt arthroscopy  . Foot surgery    . Appendectomy    . Esophagogastroduodenoscopy N/A 08/02/2013    Procedure: ESOPHAGOGASTRODUODENOSCOPY (EGD);  Surgeon: Ladene Artist, MD;  Location[facility] ENDOSCOPY;  Service: Endoscopy;  Laterality: N/A;   Family History  Problem Relation Age of Onset  . Coronary artery disease Father     open heart surgery   . CAD Cousin     requiring stent   History  Substance Use Topics  . Smoking status: Never Smoker   . Smokeless tobacco: Not on file  . Alcohol Use: Yes     Comment: weekly    Review of Systems  Constitutional: Positive for diaphoresis. Negative for fever, activity change, appetite change and fatigue.  HENT: Negative for congestion, facial swelling, rhinorrhea and trouble swallowing.   Eyes: Negative for photophobia and pain.  Respiratory: Positive for shortness of breath. Negative for cough and chest tightness.   Cardiovascular: Positive for chest pain. Negative for leg swelling.  Gastrointestinal: Positive for nausea, blood in stool and hematochezia. Negative for vomiting, abdominal pain, diarrhea and constipation.  Endocrine: Negative for polydipsia and polyuria.  Genitourinary: Negative for dysuria, urgency, decreased urine volume and difficulty urinating.  Musculoskeletal: Negative for back pain and gait problem.  Skin: Negative for color change,  rash and wound.  Allergic/Immunologic: Negative for immunocompromised state.  Neurological: Negative for dizziness, facial asymmetry, speech difficulty, weakness, numbness and headaches.  Psychiatric/Behavioral: Negative for confusion, decreased concentration and agitation.      Allergies  Adhesive  Home Medications   Prior to Admission medications   Medication Sig Start Date End Date Taking? Authorizing Provider  aspirin EC  325 MG tablet Take 325 mg by mouth daily.   Yes Historical Provider, MD  clopidogrel (PLAVIX) 75 MG tablet Take 75 mg by mouth daily with breakfast.   Yes Historical Provider, MD  diazepam (VALIUM) 5 MG tablet Take 1 tablet (5 mg total) by mouth every 6 (six) hours as needed for muscle spasms. 04/03/13  Yes Eustace Moore, MD  metoprolol tartrate (LOPRESSOR) 25 MG tablet Take 25 mg by mouth daily.    Yes Historical Provider, MD  quinapril-hydrochlorothiazide (ACCURETIC) 20-12.5 MG per tablet Take 1 tablet by mouth daily.   Yes Historical Provider, MD  rosuvastatin (CRESTOR) 40 MG tablet Take 20 mg by mouth daily.    Yes Historical Provider, MD   BP 130/64  Pulse 85  Temp(Src) 97.3 F (36.3 C) (Oral)  Resp 10  Ht 6' (1.829 m)  Wt 313 lb 7.9 oz (142.2 kg)  BMI 42.51 kg/m2  SpO2 98% Physical Exam  Constitutional: He is oriented to person, place, and time. He appears well-developed and well-nourished. No distress.  HENT:  Head: Normocephalic and atraumatic.  Mouth/Throat: No oropharyngeal exudate.  Eyes: Pupils are equal, round, and reactive to light.  Neck: Normal range of motion. Neck supple.  Cardiovascular: Normal rate, regular rhythm and normal heart sounds.  Exam reveals no gallop and no friction rub.   No murmur heard. Pulmonary/Chest: Effort normal and breath sounds normal. No respiratory distress. He has no wheezes. He has no rales.  Abdominal: Soft. Bowel sounds are normal. He exhibits no distension and no mass. There is no tenderness. There is no rebound and no guarding.  Genitourinary: Guaiac positive stool (Dark stool on rectal exam).  Musculoskeletal: Normal range of motion. He exhibits no edema and no tenderness.  Neurological: He is alert and oriented to person, place, and time.  Skin: Skin is warm and dry.  Psychiatric: He has a normal mood and affect.    ED Course  Procedures (including critical care time) Labs Review Labs Reviewed  COMPREHENSIVE METABOLIC PANEL -  Abnormal; Notable for the following:    Glucose, Bld 133 (*)    BUN 45 (*)    Anion gap 17 (*)    All other components within normal limits  CBC - Abnormal; Notable for the following:    RBC 4.05 (*)    Hemoglobin 12.6 (*)    HCT 37.6 (*)    All other components within normal limits  URINALYSIS, ROUTINE W REFLEX MICROSCOPIC - Abnormal; Notable for the following:    Ketones, ur 15 (*)    All other components within normal limits  CBC - Abnormal; Notable for the following:    RBC 3.46 (*)    Hemoglobin 10.8 (*)    HCT 32.2 (*)    All other components within normal limits  CBC - Abnormal; Notable for the following:    RBC 3.08 (*)    Hemoglobin 9.7 (*)    HCT 28.5 (*)    RDW 15.6 (*)    All other components within normal limits  COMPREHENSIVE METABOLIC PANEL - Abnormal; Notable for the following:    Glucose, Bld 114 (*)  Calcium 8.1 (*)    Total Protein 5.9 (*)    Albumin 3.3 (*)    All other components within normal limits  CBC - Abnormal; Notable for the following:    RBC 2.96 (*)    Hemoglobin 9.2 (*)    HCT 27.1 (*)    RDW 15.6 (*)    All other components within normal limits  CBC - Abnormal; Notable for the following:    RBC 2.91 (*)    Hemoglobin 9.1 (*)    HCT 26.9 (*)    All other components within normal limits  CBC - Abnormal; Notable for the following:    RBC 2.71 (*)    Hemoglobin 8.6 (*)    HCT 25.3 (*)    All other components within normal limits  POC OCCULT BLOOD, ED - Abnormal; Notable for the following:    Fecal Occult Bld POSITIVE (*)    All other components within normal limits  MRSA PCR SCREENING  H.PYLORI ANTIGEN, STOOL  PRO B NATRIURETIC PEPTIDE  LIPASE, BLOOD  TROPONIN I  TROPONIN I  TROPONIN I  PROTIME-INR  TROPONIN I  H. PYLORI ANTIBODY, IGG  CBC  I-STAT TROPOININ, ED  I-STAT CG4 LACTIC ACID, ED  TYPE AND SCREEN    Imaging Review No results found.   EKG Interpretation   Date/Time:  Monday August 01 2013 08:57:50  EDT Ventricular Rate:  107 PR Interval:  182 QRS Duration: 92 QT Interval:  320 QTC Calculation: 427 R Axis:   -42 Text Interpretation:  Sinus tachycardia Left axis deviation Incomplete  right bundle branch block Anterior infarct , age undetermined New TWI lead  aVL Abnormal ECG Confirmed by Severn Goddard  MD, Sylvio Weatherall ([id number]) on 08/01/2013  9:12:42 AM      MDM   Final diagnoses:  Gastrointestinal hemorrhage, unspecified gastritis, unspecified gastrointestinal hemorrhage type  Chest pain, unspecified chest pain type    Pt is a 63 y.o. male with Pmhx as above who presents with intermittent chest pressure since yesterday, as well as 4-5 episodes of dark tarry stools.  On PE, Pt initially hypotensive in 32'I systolic, heme + black stool on rectal exam. He is having mild CP.  Pt has one new TWI on EKG< otherwise unchanged. Trop not elevated, CXR w/o acute changes. Hg stable, but likely falsely elevated. CP may represent demand ischemia due to blood loss from GI bleed. Pt will be admitted to medial service for further w/u.         Neta Ehlers, MD 08/03/13 [phone]

## 2013-08-03 NOTE — Progress Notes (Signed)
  I have seen and examined the patient myself, and I have reviewed the note by Katherene Ponto, MS 4 and was present during the interview and physical exam.  Please see my separate H&P for additional findings, assessment, and plan.   Signed: Clinton Gallant, MD 08/03/2013, 12:51 PM

## 2013-08-03 NOTE — Progress Notes (Signed)
Subjective: NAEON. Pt still having drop in Hgb and only had 1 BM overnight. Having some short <2 sec sharp pain in left arm with numbness this usually happens at home for the patient. Tolerating regular diet.   Objective: Vital signs in last 24 hours: Filed Vitals:   08/02/13 2001 08/02/13 2322 08/03/13 0353 08/03/13 0751  BP: 96/51 103/52 111/56   Pulse: 81 79 69   Temp: 98.1 F (36.7 C) 97.8 F (36.6 C) 98.3 F (36.8 C) 97.3 F (36.3 C)  TempSrc: Oral Oral Oral Oral  Resp: 18 20 10    Height:      Weight:      SpO2: 97% 97% 97%    Weight change:   Intake/Output Summary (Last 24 hours) at 08/03/13 0906 Last data filed at 08/02/13 2338  Gross per 24 hour  Intake    980 ml  Output      0 ml  Net    980 ml   General: resting in bed  HEENT: PERRL, EOMI, no scleral icterus  Cardiac: RRR, no rubs, murmurs or gallops  Pulm: clear to auscultation bilaterally, moving normal volumes of air  Abd: soft, some LLQ tenderness to deep palpation no rebound or guarding, nondistended, BS present  Ext: warm and well perfused, no pedal edema  Neuro: alert and oriented X3, cranial nerves II-XII grossly intact  Lab Results: Basic Metabolic Panel:  Recent Labs Lab 08/01/13 0914 08/02/13 0700  NA 140 139  K 4.3 3.8  CL 100 102  CO2 23 23  GLUCOSE 133* 114*  BUN 45* 22  CREATININE 0.90 0.82  CALCIUM 9.5 8.1*   Liver Function Tests:  Recent Labs Lab 08/01/13 0914 08/02/13 0700  AST 36 23  ALT 25 16  ALKPHOS 75 56  BILITOT 0.9 0.8  PROT 7.6 5.9*  ALBUMIN 4.0 3.3*    Recent Labs Lab 08/01/13 0914  LIPASE 29   CBC:  Recent Labs Lab 08/02/13 1910 08/03/13 0756  WBC 6.6 4.5  HGB 9.1* 8.6*  HCT 26.9* 25.3*  MCV 92.4 93.4  PLT 189 165   Cardiac Enzymes:  Recent Labs Lab 08/01/13 1340 08/01/13 1840 08/02/13 0036  TROPONINI <0.30 <0.30 <0.30   BNP:  Recent Labs Lab 08/01/13 0903  PROBNP 61.5   Coagulation:  Recent Labs Lab 08/02/13 0700    LABPROT 13.9  INR 1.07   Urinalysis:  Recent Labs Lab 08/01/13 1451  COLORURINE YELLOW  LABSPEC 1.023  PHURINE 5.0  GLUCOSEU NEGATIVE  HGBUR NEGATIVE  BILIRUBINUR NEGATIVE  KETONESUR 15*  PROTEINUR NEGATIVE  UROBILINOGEN 0.2  NITRITE NEGATIVE  LEUKOCYTESUR NEGATIVE   Micro Results: Recent Results (from the past 240 hour(s))  MRSA PCR SCREENING     Status: None   Collection Time    08/01/13  2:30 PM      Result Value Ref Range Status   MRSA by PCR NEGATIVE  NEGATIVE Final   Comment:            The GeneXpert MRSA Assay (FDA     approved for NASAL specimens     only), is one component of a     comprehensive MRSA colonization     surveillance program. It is not     intended to diagnose MRSA     infection nor to guide or     monitor treatment for     MRSA infections.   Studies/Results: Dg Chest Port 1 View  08/01/2013   CLINICAL DATA:  Chest  pain with shortness of breath with history of cardiac stents ; rectal bleeding  EXAM: PORTABLE CHEST - 1 VIEW  COMPARISON:  PA and lateral chest x-ray of March 28, 2013  FINDINGS: The lungs are adequately inflated and clear. The heart and pulmonary vascularity are within the limits of normal. There is no pleural effusion. The mediastinum is normal in width. There is no pleural effusion or pneumothorax. The bony thorax is unremarkable.  IMPRESSION: There is no acute cardiopulmonary abnormality.   Electronically Signed   By: David  Martinique   On: 08/01/2013 09:22   Medications: I have reviewed the patient's current medications. Scheduled Meds: . fentaNYL  50 mcg Intravenous Once  . pantoprazole (PROTONIX) IV  40 mg Intravenous Q12H  . sodium chloride  3 mL Intravenous Q12H   Continuous Infusions:  PRN Meds:.ondansetron (ZOFRAN) IV, ondansetron Assessment/Plan: # Acute GIB: Pt presents with continued and witnessed hematochezia that was found to be FOBT + in the ED, UA negative for RBC, lactic acid was normal. Hgb baseline is usually  13>>12 and 8.6 today. EGD on 7/21 showed 2 large non bleeding ulcerations and no bx were taken before treatment and in the setting of recent plavix. H. Pylori testing still pending. Pt has not had any repeat hematochezia. Concern still have not identified nadir of acute blood loss. Transfusion for Hgb<7.0.  -cbc q12h -transfuse if Hgb <7.0 -HH diet -GI consult recs greatly recommended - no NSAIDS or ASA for 2 wks -transfer to tele  -SCDs  -PPI BID   # Chest pain-resovled: Pt does have hx of CAD s/p DES 2005 and negative myoview 2013. maybe demand ischemia in setting of acute GIB, no significant ECG changes, CXR normal. Pt no recent Echo but no known hx of CHF.  -hold plavix and ASA until 2 wks per GI -repeat troponin was negativen and EKG with no ischemic changes repeated on 08/03/13  #Chronic Back Pain- Pt has had several pervious history back surgeries and reports taking valium and oxycodone for pain occasionally. Currently back pain is stable and no need for pharmacologic management.  -transition to oral meds   Dispo: Disposition is deferred at this time, awaiting improvement of current medical problems.  Anticipated discharge in approximately 1-2 day(s).   The patient does have a current PCP (Halford Chessman, MD) and does not need an [facility] hospital follow-up appointment after discharge.  The patient does not have transportation limitations that hinder transportation to clinic appointments.  .Services Needed at time of discharge: Y = Yes, Blank = No PT:   OT:   RN:   Equipment:   Other:     LOS: 2 days   Clinton Gallant, MD 08/03/2013, 9:06 AM

## 2013-08-04 DIAGNOSIS — I2510 Atherosclerotic heart disease of native coronary artery without angina pectoris: Secondary | ICD-10-CM

## 2013-08-04 DIAGNOSIS — I200 Unstable angina: Secondary | ICD-10-CM

## 2013-08-04 LAB — CBC
HCT: 27.3 % — ABNORMAL LOW (ref 39.0–52.0)
Hemoglobin: 9.3 g/dL — ABNORMAL LOW (ref 13.0–17.0)
MCH: 31.7 pg (ref 26.0–34.0)
MCHC: 34.1 g/dL (ref 30.0–36.0)
MCV: 93.2 fL (ref 78.0–100.0)
Platelets: 175 10*3/uL (ref 150–400)
RBC: 2.93 MIL/uL — ABNORMAL LOW (ref 4.22–5.81)
RDW: 16.2 % — ABNORMAL HIGH (ref 11.5–15.5)
WBC: 5.7 10*3/uL (ref 4.0–10.5)

## 2013-08-04 LAB — MAGNESIUM: Magnesium: 1.9 mg/dL (ref 1.5–2.5)

## 2013-08-04 LAB — PREPARE RBC (CROSSMATCH)

## 2013-08-04 MED ORDER — ISOSORBIDE DINITRATE 10 MG PO TABS
10.0000 mg | ORAL_TABLET | Freq: Three times a day (TID) | ORAL | Status: DC
  Administered 2013-08-04 (×3): 10 mg via ORAL
  Filled 2013-08-04 (×6): qty 1

## 2013-08-04 MED ORDER — PANTOPRAZOLE SODIUM 40 MG PO TBEC
40.0000 mg | DELAYED_RELEASE_TABLET | Freq: Two times a day (BID) | ORAL | Status: DC
  Administered 2013-08-04 – 2013-08-07 (×6): 40 mg via ORAL
  Filled 2013-08-04 (×6): qty 1

## 2013-08-04 MED ORDER — METOPROLOL TARTRATE 25 MG PO TABS
25.0000 mg | ORAL_TABLET | Freq: Two times a day (BID) | ORAL | Status: DC
  Administered 2013-08-04 – 2013-08-07 (×3): 25 mg via ORAL
  Filled 2013-08-04 (×8): qty 1

## 2013-08-04 NOTE — Consult Note (Signed)
Reason for Consult: Unstable angina  Requesting Physician: Ellwood Dense  Cardiologist: Wynonia Lawman  PCP: Hilma Favors  HPI: This is a 63 y.o. male with a past medical history significant for CAD with a drug eluting Taxus stent implanted (vessel unknown) at [facility] in 2005. November 2013; Cardiac Cath normal Left main, scattered irregularities LAD, 80% stenosis ostial Diag 1, scattered irregularities CFX, scattered irregularities RCA; LVEF of 75% documented via nuclear study on 11/27/2011.  Over the last 6-8 months he has developed reduced exercise tolerance (mostly exertional dyspnea) and occasional episodes of brief chest squeezing ("tennis ball in my chest", uses Levine sign), usually lasting for about 60 seconds, relieved by rest.  Symptoms drastically worsened over the last week when he developed melena and he began experiencing chest discomfort at rest, more lengthy episodes. Admitted 7/20 and found to have two gastric ulcerations. ASA and clopidogrel were stopped, but so was his beta blocker and antihypertensive (ACEi/thiazide combo).  He has had two episodes of prolonged chest pain at rest, including a 20 minute event last night that was severe, but promptly resolved after a single sublingual NTG.   PMHx:  Past Medical History  Diagnosis Date  . Hypertension   . Shortness of breath   . Arthritis    Past Surgical History  Procedure Laterality Date  . Cardiac catheterization      05    1 stent   dr Wynonia Lawman  . Back surgery      x2  . Joint replacement      x4  rt knee, x2lt arthroscopy  . Foot surgery    . Appendectomy    . Esophagogastroduodenoscopy N/A 08/02/2013    Procedure: ESOPHAGOGASTRODUODENOSCOPY (EGD);  Surgeon: Ladene Artist, MD;  Location: [facility] ENDOSCOPY;  Service: Endoscopy;  Laterality: N/A;    FAMHx: Family History  Problem Relation Age of Onset  . Coronary artery disease Father     open heart surgery   . CAD Cousin     requiring stent     SOCHx:  reports that he has never smoked. He does not have any smokeless tobacco history on file. He reports that he drinks alcohol. He reports that he does not use illicit drugs.  ALLERGIES: Allergies  Allergen Reactions  . Adhesive [Tape] Other (See Comments)    Blisters.  Paper tape ok.    ROS: Constitutional: positive for fatigue, negative for chills, fevers and night sweats Ears, nose, mouth, throat, and face: positive for snoring, negative for epistaxis, sore throat and voice change Respiratory: positive for dyspnea on exertion, negative for cough, sputum and wheezing Cardiovascular: positive for chest pressure/discomfort and dyspnea, negative for irregular heart beat, lower extremity edema and syncope Gastrointestinal: positive for abdominal pain and melena, negative for nausea and vomiting Genitourinary:negative for decreased stream, frequency and nocturia Hematologic/lymphatic: negative for easy bruising, lymphadenopathy and petechiae Musculoskeletal:positive for back pain Neurological: positive for paresthesia, negative for gait problems, speech problems and weakness Behavioral/Psych: negative Endocrine: negative for diabetic symptoms including polydipsia, polyphagia and polyuria  HOME MEDICATIONS: Prescriptions prior to admission  Medication Sig Dispense Refill  . aspirin EC 325 MG tablet Take 325 mg by mouth daily.      . clopidogrel (PLAVIX) 75 MG tablet Take 75 mg by mouth daily with breakfast.      . diazepam (VALIUM) 5 MG tablet Take 1 tablet (5 mg total) by mouth every 6 (six) hours as needed for muscle spasms.  30 tablet  0  . metoprolol  tartrate (LOPRESSOR) 25 MG tablet Take 25 mg by mouth daily.       . quinapril-hydrochlorothiazide (ACCURETIC) 20-12.5 MG per tablet Take 1 tablet by mouth daily.      . rosuvastatin (CRESTOR) 40 MG tablet Take 20 mg by mouth daily.         HOSPITAL MEDICATIONS: Scheduled: . fentaNYL  50 mcg Intravenous Once  .  pantoprazole (PROTONIX) IV  40 mg Intravenous Q12H  . sodium chloride  3 mL Intravenous Q12H    VITALS: Blood pressure 110/70, pulse 77, temperature 98.8 F (37.1 C), temperature source Oral, resp. rate 16, height 6' (1.829 m), weight 320 lb 15.8 oz (145.6 kg), SpO2 98.00%.  PHYSICAL EXAM:  General: Alert, oriented x3, no distress, morbidly obese, pale Head: no evidence of trauma, PERRL, EOMI, no exophtalmos or lid lag, no myxedema, no xanthelasma; normal ears, nose and oropharynx Neck: normal jugular venous pulsations and no hepatojugular reflux; brisk carotid pulses without delay and no carotid bruits Chest: clear to auscultation, no signs of consolidation by percussion or palpation, normal fremitus, symmetrical and full respiratory excursions Cardiovascular: normal position and quality of the apical impulse, regular rhythm, normal first heart sound and normal second heart sound, no rubs or gallops, no murmur Abdomen: no tenderness or distention, no masses by palpation, no abnormal pulsatility or arterial bruits, normal bowel sounds, no hepatosplenomegaly Extremities: no clubbing, cyanosis;  no edema; 2+ radial, ulnar and brachial pulses bilaterally; 2+ right femoral, posterior tibial and dorsalis pedis pulses; 2+ left femoral, posterior tibial and dorsalis pedis pulses; no subclavian or femoral bruits Neurological: grossly nonfocal   LABS  CBC  Recent Labs  08/03/13 0756 08/03/13 1942  WBC 4.5 5.2  HGB 8.6* 9.0*  HCT 25.3* 27.4*  MCV 93.4 94.5  PLT 165 347   Basic Metabolic Panel  Recent Labs  08/02/13 0700  NA 139  K 3.8  CL 102  CO2 23  GLUCOSE 114*  BUN 22  CREATININE 0.82  CALCIUM 8.1*   Liver Function Tests  Recent Labs  08/02/13 0700  AST 23  ALT 16  ALKPHOS 56  BILITOT 0.8  PROT 5.9*  ALBUMIN 3.3*   No results found for this basename: LIPASE, AMYLASE,  in the last 72 hours Cardiac Enzymes  Recent Labs  08/01/13 1840 08/02/13 0036  08/03/13 1020  TROPONINI <0.30 <0.30 <0.30     IMAGING: No results found.  ECG: NSR, no repol changes ECG 7/20 with minor, nonspecific inverted T wave in aVL, resolved   IMPRESSION: Suspect he has significant new coronary stenoses, but that his unstable chest pain pattern is triggered by the acute GI bleed  1. Unstable angina, without high risk ECG or enzyme findings, hopefully primarily due to anemia and beta blocker withdrawal. Symptoms actually precede the GI bleeding by 6-8 months, but had a more stable pattern. 2. Known CAD s/p drug eluting Taxus stent (vessel uncertain) 10 years ago 3. HTN   4. UGI hemorrhage, acute threshold for transfusion should be based on symptoms rather than just the absolute Hgb level 5. Morbid obesity 6. 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 Resume beta blocker 2. Long acting nitrates 3. Avoid invasive evaluation until he becomes a candidate for revascularization procedures/antiplatelet therapy; the only exception would be if he develops prolonged chest pain/ECG changes not relieved by NTG, consistent with an MI 4. Check echo for LVEF in view of his 6 month history of exertional dyspnea; if EF is  low, cardiac cath when ulcers are fully healed, if EF normal - risk stratify with a nuclear perfusion study. 5. As BP increases, resume Accuretic. Resume Crestor at discharge.  Time Spent Directly with Patient: 60 minutes  Sanda Klein, MD, [facility] [phone] office [phone] pager   08/04/2013, 10:45 AM

## 2013-08-04 NOTE — Progress Notes (Signed)
  PROGRESS NOTE MEDICINE TEACHING ATTENDING   Day 3 of stay Patient name: Stephen Medina   Medical record number: [id number] Date of birth: [birth year]   Stephen Medina appears well today. He offers no new complaints except chest pain around 3 am at night which lasted for 10-15 minutes and was like his heart was caught in a grip. Most likely this pain is due to acute anemia, however we will get cardiology input before he is discharged. I agree with the exam in the medical student note. I was present when he did this exam on the patient.  Assessment and Plan                                                                        Cardiology input appreciated. Will check echo. Agree with cardiology about CP being a symptom of acute bleed. Would transfuse 1 unit of PRBC - agree with cardiology on symptomatic anemia.   Stable Hgb post bleed. No more dark stool.    I have discussed the care of this patient with my IM team residents. Please see the resident note for details.  [place], [place] 08/04/2013, 2:05 PM.

## 2013-08-04 NOTE — Progress Notes (Signed)
Subjective: NAEON. BP remains in the low normal range. Pt is feeling better and abdominal pain has improved although he did have another episode of substernal chest pain earlier this morning that lasted ~15 min. Pain resolved with use of nitroglycerin .He denies any further coffee ground stools and would like to go home today.  ECG during episode of chest pain yesterday did not show any acute changes. Troponins were negative. He did take nitroglycerin at that time that helped resolve his pain.   Objective: Vital signs in last 24 hours: Filed Vitals:   08/03/13 2009 08/03/13 2037 08/04/13 0533 08/04/13 1052  BP: 109/69 126/78 110/70 116/70  Pulse: 76 76 77 69  Temp: 98.3 F (36.8 C) 98.7 F (37.1 C) 98.8 F (37.1 C)   TempSrc: Oral Oral Oral   Resp: 16 16 16    Height:  6' (1.829 m)    Weight:  145.6 kg (320 lb 15.8 oz)    SpO2: 100% 100% 98%    Weight change:   Intake/Output Summary (Last 24 hours) at 08/04/13 1112 Last data filed at 08/04/13 1006  Gross per 24 hour  Intake    246 ml  Output      0 ml  Net    246 ml    General: lying in bed, obese, in no acute distress Heart: RRR, normal S1, S2, no murmurs, no pain on palpation of chest.  Lungs: Normal work of breathing, CTAB, no wheezes, rhonchi or crackles.  Abdominal: Normoactive bowel sounds, non-distended, mild suprapubic tenderness that has improved since yesterday. Skin: No new rashes or ulcers  Neuro: A&Ox3, no focal motor deficits  Lab Results: Basic Metabolic Panel:  Recent Labs  08/02/13 0700  NA 139  K 3.8  CL 102  CO2 23  GLUCOSE 114*  BUN 22  CREATININE 0.82  CALCIUM 8.1*   Liver Function Tests:  Recent Labs  08/02/13 0700  AST 23  ALT 16  ALKPHOS 56  BILITOT 0.8  PROT 5.9*  ALBUMIN 3.3*   No results found for this basename: LIPASE, AMYLASE,  in the last 72 hours No results found for this basename: AMMONIA,  in the last 72 hours CBC:  Recent Labs  08/03/13 0756 08/03/13 1942    WBC 4.5 5.2  HGB 8.6* 9.0*  HCT 25.3* 27.4*  MCV 93.4 94.5  PLT 165 164   Cardiac Enzymes:  Recent Labs  08/01/13 1840 08/02/13 0036 08/03/13 1020  TROPONINI <0.30 <0.30 <0.30   BNP: No results found for this basename: PROBNP,  in the last 72 hours Coagulation:  Recent Labs  08/02/13 0700  LABPROT 13.9  INR 1.07   Urinalysis:  Recent Labs  08/01/13 1451  COLORURINE YELLOW  LABSPEC 1.023  PHURINE 5.0  GLUCOSEU NEGATIVE  HGBUR NEGATIVE  BILIRUBINUR NEGATIVE  KETONESUR 15*  PROTEINUR NEGATIVE  UROBILINOGEN 0.2  NITRITE NEGATIVE  LEUKOCYTESUR NEGATIVE    Micro Results: Recent Results (from the past 240 hour(s))  MRSA PCR SCREENING     Status: None   Collection Time    08/01/13  2:30 PM      Result Value Ref Range Status   MRSA by PCR NEGATIVE  NEGATIVE Final   Comment:            The GeneXpert MRSA Assay (FDA     approved for NASAL specimens     only), is one component of a     comprehensive MRSA colonization     surveillance program. It  is not     intended to diagnose MRSA     infection nor to guide or     monitor treatment for     MRSA infections.   Studies/Results: No results found. Medications: I have reviewed the patient's current medications. Scheduled Meds: . fentaNYL  50 mcg Intravenous Once  . isosorbide dinitrate  10 mg Oral TID  . metoprolol tartrate  25 mg Oral BID  . pantoprazole (PROTONIX) IV  40 mg Intravenous Q12H  . sodium chloride  3 mL Intravenous Q12H   Continuous Infusions:  PRN Meds:.ALPRAZolam, gi cocktail, ondansetron (ZOFRAN) IV, ondansetron Assessment/Plan:  Principal Problem:   GI bleed Active Problems:   CAD (coronary artery disease)   GIB (gastrointestinal bleeding)   Acute posthemorrhagic anemia   Gastric ulcer with hemorrhage  This is a 63 yo make with PMH of CAD(with stent placed in 2005) who presented with acute upper GI bleed with EGD showing PUD.  GI bleed w/coffee ground stools 2/2 PUD: Pt was  tachycardic with BP of 107/74 on admission, Hgb of 12 BUN 48. S/p 2 L of fluid in ED. He had mild epigastric tenderness on palpation. Pt had EGD that showed two non-bleeding ulcers on the posterior wall of the stomach. Biopsies were not taken as pt had been on Plavix. H. Pylori was negative. He will need a repeat endoscopy in 2-3 months to evaluate ulcer healing and rule out malignancy. He required bolus of fluid on 7/22 due to hypotension. Hgb trend  12->10->9.2->8.6->9.0. Pt Hgb appears to have reached it's nadir, so unlikely that there is another source of bleeding. Pt should be able to go home today pending cardiology evaluation (see below) with PCP f/u next week.  - Avoid ASA/NSAIDs for two weeks. Then ok to resume ASA 81 mg -continue to hold Plavix  - maintain IV access   - heart healthy diet  - Strict I&Os   - Protonix 40 mg BID (continue PPI BID for 4 weeks. Then PPI once a day long term) - schedule f/u GI endoscopy for 2-3 months -transfer to floor today    Chest pain: Initial episode of substernal chest pain, with diaphoresis that improved after 5 minutes could be cardiac in nature given stress 2/2 to blood loss. Admission ECG did show some T wave inversion along with tachycardia, but no other major changes. Sharp episodes of stabbing pain are less likely cardiac in nature and more likely MSK related. Some of symptoms could be attributed to his PUD. Patient has continued to have episodes of chest pain while hospitalized that are 15-20 minute in duration and responsive to nitroglycerin. Repeat troponins and ECGs have remained normal except for newly developed 1st degree AV block with a few nonspecific T-wave inversions. -cardiology consult, appreciate recommendation -continuous teli - repeat ECG with episode this AM -check Mg given 1st degree AV block.  CAD: Pt has a history of coronary artery disease with previous stenting in 2005 and had a negative nuclear perfusion scan two years ago.  Per pt he had 95 occulusion of one of his coronary arteries.  - hold Plavix and aspirin for now  - continuous telemetry as above   Chronic Back Pain- Pt has had several pervious history back surgeries and reports taking valium and oxycodone for pain occasionally. Currently back pain is stable and no need for pharmacologic management.   Prophylaxis:  -SCDs  -No heparin give acute GI bleed   Diet: Regular diet   Dispo: Disposition is deferred  at this time, awaiting improvement of current medical problems. Anticipated discharge in approximately 1-2 day(s).   The patient does have a current PCP Halford Chessman, MD) and does need an [facility] hospital follow-up appointment after discharge.   The patient does not have transportation limitations that hinder transportation to clinic appointments.   .Services Needed at time of discharge: Y = Yes, Blank = No PT:   OT:   RN:   Equipment:   Other:     LOS: 3 days   Azucena Cecil, Med Student 08/04/2013, 11:12 AM

## 2013-08-05 DIAGNOSIS — I517 Cardiomegaly: Secondary | ICD-10-CM

## 2013-08-05 DIAGNOSIS — I209 Angina pectoris, unspecified: Secondary | ICD-10-CM

## 2013-08-05 LAB — TYPE AND SCREEN
ABO/RH(D): O POS
Antibody Screen: NEGATIVE
Unit division: 0

## 2013-08-05 LAB — CBC
HCT: 29.8 % — ABNORMAL LOW (ref 39.0–52.0)
Hemoglobin: 10 g/dL — ABNORMAL LOW (ref 13.0–17.0)
MCH: 31.3 pg (ref 26.0–34.0)
MCHC: 33.6 g/dL (ref 30.0–36.0)
MCV: 93.4 fL (ref 78.0–100.0)
Platelets: 210 10*3/uL (ref 150–400)
RBC: 3.19 MIL/uL — ABNORMAL LOW (ref 4.22–5.81)
RDW: 16.2 % — ABNORMAL HIGH (ref 11.5–15.5)
WBC: 6.8 10*3/uL (ref 4.0–10.5)

## 2013-08-05 MED ORDER — PERFLUTREN LIPID MICROSPHERE
INTRAVENOUS | Status: AC
  Filled 2013-08-05: qty 10

## 2013-08-05 MED ORDER — PERFLUTREN LIPID MICROSPHERE
1.0000 mL | INTRAVENOUS | Status: AC | PRN
  Administered 2013-08-05: 4 mL via INTRAVENOUS
  Filled 2013-08-05: qty 10

## 2013-08-05 MED ORDER — PANTOPRAZOLE SODIUM 40 MG PO TBEC: 40.0000 mg | DELAYED_RELEASE_TABLET | Freq: Two times a day (BID) | ORAL | Status: DC

## 2013-08-05 NOTE — Progress Notes (Signed)
CARE MANAGEMENT NOTE 08/05/2013  Patient:  Stephen Medina, Stephen Medina   Account Number:  [id number]  Date Initiated:  08/02/2013  Documentation initiated by:  Marvetta Gibbons  Subjective/Objective Assessment:   Pt admitted with GIB- EGD for today     Action/Plan:   Anticipated DC Date:  08/06/2013   Anticipated DC Plan:  [place]  CM consult      Choice offered to / List presented to:             Status of service:  In process, will continue to follow Medicare Important Message given?  YES (If response is "NO", the following Medicare IM given date fields will be blank) Date Medicare IM given:  08/05/2013 Medicare IM given by:  [facility] Date Additional Medicare IM given:   Additional Medicare IM given by:    Discharge Disposition:    Per UR Regulation:  Reviewed for med. necessity/level of care/duration of stay  If discussed at [place] of Stay Meetings, dates discussed:    Comments:

## 2013-08-05 NOTE — Progress Notes (Signed)
Subjective: He reports a couple more episodes of the "tennis ball squeezing" he described earlier.  Objective: Vital signs in last 24 hours: Temp:  [97.9 F (36.6 C)-98.7 F (37.1 C)] 97.9 F (36.6 C) (07/24 0520) Pulse Rate:  [57-64] 57 (07/24 0520) Resp:  [16-18] 16 (07/24 0520) BP: (91-125)/(46-68) 91/46 mmHg (07/24 0520) SpO2:  [96 %-98 %] 98 % (07/24 0520) Last BM Date: 08/02/13  Intake/Output from previous day: 07/23 0701 - 07/24 0700 In: 371.8 [I.V.:3; Blood:368.8] Out: -  Intake/Output this shift: Total I/O In: 3 [I.V.:3] Out: -   Medications Current Facility-Administered Medications  Medication Dose Route Frequency Provider Last Rate Last Dose  . ALPRAZolam (XANAX) tablet 0.25 mg  0.25 mg Oral TID PRN Clinton Gallant, MD      . fentaNYL (SUBLIMAZE) injection 50 mcg  50 mcg Intravenous Once Neta Ehlers, MD      . gi cocktail (Maalox,Lidocaine,Donnatal)  30 mL Oral BID PRN Clinton Gallant, MD      . metoprolol tartrate (LOPRESSOR) tablet 25 mg  25 mg Oral BID Sanda Klein, MD   25 mg at 08/04/13 1052  . ondansetron (ZOFRAN) tablet 4 mg  4 mg Oral Q6H PRN Clinton Gallant, MD       Or  . ondansetron [facility]) injection 4 mg  4 mg Intravenous Q6H PRN Clinton Gallant, MD      . pantoprazole (PROTONIX) EC tablet 40 mg  40 mg Oral BID Charmian Muff Welch, RPH   40 mg at 08/05/13 1104  . perflutren lipid microspheres (DEFINITY) IV suspension  1-10 mL Intravenous PRN Mihai Croitoru, MD   4 mL at 08/05/13 1055  . sodium chloride 0.9 % injection 3 mL  3 mL Intravenous Q12H Clinton Gallant, MD   3 mL at 08/05/13 1104    PE: General appearance: alert, cooperative and no distress Lungs: clear to auscultation bilaterally Heart: regular rate and rhythm, S1, S2 normal, no murmur, click, rub or gallop Extremities: No LEE Pulses: 2+ and symmetric Skin: Warm and dry Neurologic: Grossly normal  Lab Results:   Recent Labs  08/03/13 1942 08/04/13 1930 08/05/13 0925  WBC 5.2 5.7 PENDING    HGB 9.0* 9.3* 10.0*  HCT 27.4* 27.3* 29.8*  PLT 164 175 210   Cardiac Panel (last 3 results)  Recent Labs  08/03/13 1020  TROPONINI <0.30     Assessment/Plan  Active Problems:   Unstable angina In the setting of GIB(hgb as low as 8.6 and now 10). Negative troponin.  Back on beta blocker and long acting nitrates recommended.  See Dr. Victorino December note from yesterday regarding invasive procedures.  Echo just completed.  Will review.  Prior LVEF 75% in 2013 by Nuc study.  If EF low, nuc study    CAD (coronary artery disease)  s/p drug eluting Taxus stent (vessel uncertain) 10 years ago   Acute posthemorrhagic anemia   Gastric ulcer with hemorrhage    LOS: 4 days    HAGER, BRYAN PA-C 08/05/2013 11:26 AM  History and all data above reviewed.  Patient examined.  I agree with the findings as above.  The patient exam reveals COR:RRR  ,  Lungs: Clear  ,  Abd: Positive bowel sounds, no rebound no guarding, Ext No edema  .  All available labs, radiology testing, previous records reviewed. Agree with documented assessment and plan. Chest pain:  Somewhat atypical and no objective evidence of ischemia.  However, given the fact that this is similar to previous  paln Lexiscan Myoview unless EF is low at which point he would need a cath.     Jeneen Rinks Jozsef Wescoat  12:28 PM  08/05/2013

## 2013-08-05 NOTE — Progress Notes (Signed)
Subjective: NAEON. Pt has 1 U PRBC overnight and Hgb stable in 9.0 range. No signs of frank bleeding. In terms of CP pt has not had any episodes this AM. No BM documented.    Objective: Vital signs in last 24 hours: Filed Vitals:   08/04/13 1839 08/04/13 2041 08/04/13 2203 08/05/13 0520  BP: 101/61 98/59 92/54  91/46  Pulse: 59 63 64 57  Temp: 98.3 F (36.8 C) 98 F (36.7 C)  97.9 F (36.6 C)  TempSrc: Oral Oral  Oral  Resp: 16 16  16   Height:      Weight:      SpO2:  96%  98%   Weight change:   Intake/Output Summary (Last 24 hours) at 08/05/13 0750 Last data filed at 08/04/13 1807  Gross per 24 hour  Intake 371.75 ml  Output      0 ml  Net 371.75 ml    General: lying in bed, obese, in no acute distress Heart: RRR, normal S1, S2, no murmurs, no pain on palpation of chest.  Lungs: Normal work of breathing, CTAB, no wheezes, rhonchi or crackles.  Abdominal: Normoactive bowel sounds, non-distended, mild suprapubic tenderness that has improved since yesterday. Skin: No new rashes or ulcers  Neuro: A&Ox3, no focal motor deficits  Lab Results: Basic Metabolic Panel:  Recent Labs  08/04/13 1108  MG 1.9   CBC:  Recent Labs  08/03/13 1942 08/04/13 1930  WBC 5.2 5.7  HGB 9.0* 9.3*  HCT 27.4* 27.3*  MCV 94.5 93.2  PLT 164 175   Cardiac Enzymes:  Recent Labs  08/03/13 1020  TROPONINI <0.30   Micro Results: Recent Results (from the past 240 hour(s))  MRSA PCR SCREENING     Status: None   Collection Time    08/01/13  2:30 PM      Result Value Ref Range Status   MRSA by PCR NEGATIVE  NEGATIVE Final   Comment:            The GeneXpert MRSA Assay (FDA     approved for NASAL specimens     only), is one component of a     comprehensive MRSA colonization     surveillance program. It is not     intended to diagnose MRSA     infection nor to guide or     monitor treatment for     MRSA infections.   Studies/Results: No results found. Medications: I have  reviewed the patient's current medications. Scheduled Meds: . fentaNYL  50 mcg Intravenous Once  . isosorbide dinitrate  10 mg Oral TID  . metoprolol tartrate  25 mg Oral BID  . pantoprazole  40 mg Oral BID  . sodium chloride  3 mL Intravenous Q12H   Continuous Infusions:  PRN Meds:.ALPRAZolam, gi cocktail, ondansetron (ZOFRAN) IV, ondansetron Assessment/Plan:  This is a 63 yo make with PMH of CAD(with stent placed in 2005) who presented with acute upper GI bleed with EGD showing PUD. # Acute GIB 2/2 PUD: Pt presented with continued and witnessed hematochezia that was found to be FOBT + in the ED, UA negative for RBC, lactic acid was normal. Hgb baseline is usually 13>>12 and 8.6>>then pt transfused 1 U PRBC 2/2 ongoing chest pain thought to be 2/2 demand ischemia Hgb 9.6 this AM. EGD on 7/21 showed 2 large non bleeding ulcerations and no bx were taken before treatment and in the setting of recent plavix. H. Pylori testing still pending. Pt has not had  any repeat hematochezia. Concern still have not identified nadir of acute blood loss. Transfusion for Hgb<7.0.  -cbc q12h  -transfuse if Hgb <7.0  -HH diet  -GI consult recs greatly recommended - no NSAIDS or ASA for 2 wks  -SCDs  -PPI BID   # Chest pain-resovled: Pt does have hx of CAD s/p DES 2005 and negative myoview 2013. maybe demand ischemia in setting of acute GIB, no significant ECG changes, CXR normal. Pt no recent Echo but no known hx of CHF.  -hold plavix and ASA until 2 wks per GI. Repeat troponin was negativen and EKG with no ischemic changes repeated on 08/03/13 -will get an echo -outpt follow up for possible myoview after resolution of GIB   #Chronic Back Pain- Pt has had several pervious history back surgeries and reports taking valium and oxycodone for pain occasionally. Currently back pain is stable and no need for pharmacologic management.  -transition to oral meds   Prophylaxis:  -SCDs  -No heparin give acute GI bleed     Diet: Regular diet   Dispo: Disposition is deferred at this time, awaiting improvement of current medical problems. Anticipated discharge in approximately 1-2 day(s).   The patient does have a current PCP Halford Chessman, MD) and does need an [facility] hospital follow-up appointment after discharge.   The patient does not have transportation limitations that hinder transportation to clinic appointments.   .Services Needed at time of discharge: Y = Yes, Blank = No PT:   OT:   RN:   Equipment:   Other:     LOS: 4 days   Clinton Gallant, MD 08/05/2013, 7:50 AM

## 2013-08-05 NOTE — Progress Notes (Signed)
Subjective: Pt received 1 unit of blood overnight which raised Hgb from 9.0 ->10.0. He reports having less chest pain but did have one episode this morning that was similar in nature to previous episodes.  Pt was seen by cardiology and put back on his metoprolol as well as started on Isordil. As bp was 90/50 this morning both of these medication were d/c'd  Pt denies nausea/vomiting, abdominal pain. He has been tolerating food. He denies having any signs of bleeding since his EGD on 7/20.  Objective: Vital signs in last 24 hours: Filed Vitals:   08/04/13 1839 08/04/13 2041 08/04/13 2203 08/05/13 0520  BP: 101/61 98/59 92/54  91/46  Pulse: 59 63 64 57  Temp: 98.3 F (36.8 C) 98 F (36.7 C)  97.9 F (36.6 C)  TempSrc: Oral Oral  Oral  Resp: 16 16  16   Height:      Weight:      SpO2:  96%  98%   Weight change:   Intake/Output Summary (Last 24 hours) at 08/05/13 1116 Last data filed at 08/05/13 1104  Gross per 24 hour  Intake 371.75 ml  Output      0 ml  Net 371.75 ml    General: lying in bed, obese, in no acute distress Heart: RRR, normal S1, S2, no murmurs, no pain on palpation of chest.  Lungs: Normal work of breathing, CTAB, no wheezes, rhonchi or crackles.  Abdominal: Normoactive bowel sounds, non-distended, non-tender. Skin: No new rashes or ulcers  Neuro: A&Ox3, no focal motor deficits  Lab Results: Basic Metabolic Panel:  Recent Labs  08/04/13 1108  MG 1.9   Liver Function Tests: No results found for this basename: AST, ALT, ALKPHOS, BILITOT, PROT, ALBUMIN,  in the last 72 hours No results found for this basename: LIPASE, AMYLASE,  in the last 72 hours No results found for this basename: AMMONIA,  in the last 72 hours CBC:  Recent Labs  08/04/13 1930 08/05/13 0925  WBC 5.7 PENDING  HGB 9.3* 10.0*  HCT 27.3* 29.8*  MCV 93.2 93.4  PLT 175 210   Cardiac Enzymes:  Recent Labs  08/03/13 1020  TROPONINI <0.30   BNP: No results found for this  basename: PROBNP,  in the last 72 hours Coagulation: No results found for this basename: LABPROT, INR,  in the last 72 hours Urinalysis: No results found for this basename: COLORURINE, APPERANCEUR, LABSPEC, PHURINE, GLUCOSEU, HGBUR, BILIRUBINUR, KETONESUR, PROTEINUR, UROBILINOGEN, NITRITE, LEUKOCYTESUR,  in the last 72 hours  Micro Results: Recent Results (from the past 240 hour(s))  MRSA PCR SCREENING     Status: None   Collection Time    08/01/13  2:30 PM      Result Value Ref Range Status   MRSA by PCR NEGATIVE  NEGATIVE Final   Comment:            The GeneXpert MRSA Assay (FDA     approved for NASAL specimens     only), is one component of a     comprehensive MRSA colonization     surveillance program. It is not     intended to diagnose MRSA     infection nor to guide or     monitor treatment for     MRSA infections.   Studies/Results: No results found. Medications: I have reviewed the patient's current medications. Scheduled Meds: . fentaNYL  50 mcg Intravenous Once  . metoprolol tartrate  25 mg Oral BID  . pantoprazole  40 mg  Oral BID  . sodium chloride  3 mL Intravenous Q12H   Continuous Infusions:  PRN Meds:.ALPRAZolam, gi cocktail, ondansetron (ZOFRAN) IV, ondansetron, perflutren lipid microspheres (DEFINITY) IV suspension Assessment/Plan:  Principal Problem:   GI bleed Active Problems:   CAD (coronary artery disease)   GIB (gastrointestinal bleeding)   Acute posthemorrhagic anemia   Gastric ulcer with hemorrhage  This is a 63 yo make with PMH of CAD(with stent placed in 2005) who presented with acute upper GI bleed with EGD showing PUD.  GI bleed w/coffee ground stools 2/2 PUD: Pt was tachycardic with BP of 107/74 on admission, Hgb of 12, BUN 48. S/p 2 L of fluid in ED. He had mild epigastric tenderness on palpation. Pt had EGD that showed two non-bleeding ulcers on the posterior wall of the stomach. Biopsies were not taken as pt had been on Plavix. H. Pylori  was negative. He will need a repeat endoscopy in 2-3 months to evaluate ulcer healing and rule out malignancy. He required bolus of fluid on 7/22 due to hypotension. Hgb trend  12->10->9.2->8.6->9.0->10. S/p one unit of blood on 7/23. Pt has responded well to transfusion and has no signs of acute bleed. He should be able to go home after ECHO with PCP f/u next Wed.  - Avoid ASA/NSAIDs for two weeks. Then ok to resume ASA 81 mg -continue to hold Plavix  - maintain IV access   - heart healthy diet  - Strict I&Os   - Protonix 40 mg BID (continue PPI BID for 4 weeks. Then PPI once a day long term) - schedule f/u GI endoscopy for 2-3 months   Chest pain: Initial episode of substernal chest pain, with diaphoresis that improved after 5 minutes could be cardiac in nature given stress 2/2 to blood loss. Admission ECG did show some T wave inversion along with tachycardia, but no other major changes. Sharp episodes of stabbing pain are less likely cardiac in nature and more likely MSK related. Some of symptoms could be attributed to his PUD. Patient has continued to have episodes of chest pain while hospitalized that are 15-20 minute in duration and responsive to nitroglycerin. Repeat troponins and ECGs have remained normal except for newly developed 1st degree AV block with a few nonspecific T-wave inversions. Mg was WNL. Cardiology saw pt yesterday and he will have ECHO today. He was started on metoprolol and Isordil yesterday, but those were both stopped secondary to hypotension.  - Cardiology consulted, appreciate recs - Continuous teli - Hold BP medication for now. Can restart as outpatient - F/u echo today. Will need further evaluation of heart disease as outpatient.  CAD: Pt has a history of coronary artery disease with previous stenting in 2005 and had a negative nuclear perfusion scan two years ago. Per pt he had 95 occulusion of one of his coronary arteries.  - hold Plavix and aspirin for now  -  continuous telemetry as above   Chronic Back Pain- Pt has had several pervious back surgeries and reports taking valium and oxycodone for pain occasionally. Currently back pain is stable and no need for pharmacologic management.   Prophylaxis:  -SCDs  -No heparin give acute GI bleed   Diet: Regular diet   Dispo: Disposition is deferred at this time, awaiting improvement of current medical problems. Anticipated discharge in approximately 1-2 day(s).   The patient does have a current PCP Halford Chessman, MD) and does need an [facility] hospital follow-up appointment after discharge.   The  patient does not have transportation limitations that hinder transportation to clinic appointments.   .Services Needed at time of discharge: Y = Yes, Blank = No PT:   OT:   RN:   Equipment:   Other:     LOS: 4 days   Azucena Cecil, Med Student 08/05/2013, 11:16 AM

## 2013-08-05 NOTE — Progress Notes (Signed)
  I have seen and examined the patient myself, and I have reviewed the note by Katherene Ponto, MS 4 and was present during the interview and physical exam.  Please see my separate H&P for additional findings, assessment, and plan.   Signed: Clinton Gallant, MD 08/05/2013, 12:09 PM

## 2013-08-05 NOTE — Progress Notes (Signed)
Echocardiogram 2D Echocardiogram with Definity has been performed.  Larine Fielding 08/05/2013, 11:22 AM

## 2013-08-06 ENCOUNTER — Inpatient Hospital Stay (HOSPITAL_COMMUNITY): Payer: Medicare Other

## 2013-08-06 DIAGNOSIS — K254 Chronic or unspecified gastric ulcer with hemorrhage: Principal | ICD-10-CM

## 2013-08-06 LAB — CBC
HCT: 30.5 % — ABNORMAL LOW (ref 39.0–52.0)
Hemoglobin: 10.1 g/dL — ABNORMAL LOW (ref 13.0–17.0)
MCH: 30.3 pg (ref 26.0–34.0)
MCHC: 33.1 g/dL (ref 30.0–36.0)
MCV: 91.6 fL (ref 78.0–100.0)
Platelets: 216 10*3/uL (ref 150–400)
RBC: 3.33 MIL/uL — ABNORMAL LOW (ref 4.22–5.81)
RDW: 16.3 % — ABNORMAL HIGH (ref 11.5–15.5)
WBC: 7.1 10*3/uL (ref 4.0–10.5)

## 2013-08-06 MED ORDER — ACETAMINOPHEN 325 MG PO TABS
650.0000 mg | ORAL_TABLET | Freq: Four times a day (QID) | ORAL | Status: DC | PRN
  Administered 2013-08-06: 650 mg via ORAL
  Filled 2013-08-06: qty 2

## 2013-08-06 MED ORDER — REGADENOSON 0.4 MG/5ML IV SOLN
0.4000 mg | Freq: Once | INTRAVENOUS | Status: AC
  Administered 2013-08-06: 0.4 mg via INTRAVENOUS

## 2013-08-06 MED ORDER — REGADENOSON 0.4 MG/5ML IV SOLN
INTRAVENOUS | Status: AC
  Filled 2013-08-06: qty 5

## 2013-08-06 MED ORDER — TECHNETIUM TC 99M SESTAMIBI GENERIC - CARDIOLITE
30.0000 | Freq: Once | INTRAVENOUS | Status: AC | PRN
  Administered 2013-08-06: 30 via INTRAVENOUS

## 2013-08-06 MED ORDER — ISOSORBIDE MONONITRATE ER 30 MG PO TB24
30.0000 mg | ORAL_TABLET | Freq: Every day | ORAL | Status: DC
  Administered 2013-08-06: 30 mg via ORAL
  Filled 2013-08-06 (×2): qty 1

## 2013-08-06 NOTE — Progress Notes (Signed)
MD notified of pt's manual pressure and order received to hold lopressor and recheck BP in an hour and notify. Will cont to monitor.

## 2013-08-06 NOTE — Progress Notes (Signed)
Pt c/o headache, paged to MD . Order received for tylenol. Will cont to monitor,

## 2013-08-06 NOTE — Progress Notes (Signed)
    Subjective: No more episodes of squeezing in chest. Last BM last night looked normal.  Objective: Vital signs in last 24 hours: Temp:  [98.3 F (36.8 C)] 98.3 F (36.8 C) (07/25 0446) Pulse Rate:  [66-77] 66 (07/25 0446) Resp:  [16] 16 (07/25 0446) BP: (107-119)/(67-76) 114/70 mmHg (07/25 0446) SpO2:  [96 %-98 %] 96 % (07/25 0446) Last BM Date: 08/05/13  Intake/Output from previous day: 07/24 0701 - 07/25 0700 In: 3 [I.V.:3] Out: -  Intake/Output this shift:    Medications Current Facility-Administered Medications  Medication Dose Route Frequency Provider Last Rate Last Dose  . ALPRAZolam (XANAX) tablet 0.25 mg  0.25 mg Oral TID PRN Clinton Gallant, MD      . fentaNYL (SUBLIMAZE) injection 50 mcg  50 mcg Intravenous Once Neta Ehlers, MD      . gi cocktail (Maalox,Lidocaine,Donnatal)  30 mL Oral BID PRN Clinton Gallant, MD      . metoprolol tartrate (LOPRESSOR) tablet 25 mg  25 mg Oral BID Sanda Klein, MD   25 mg at 08/04/13 1052  . ondansetron (ZOFRAN) tablet 4 mg  4 mg Oral Q6H PRN Clinton Gallant, MD       Or  . ondansetron [place]) injection 4 mg  4 mg Intravenous Q6H PRN Clinton Gallant, MD      . pantoprazole (PROTONIX) EC tablet 40 mg  40 mg Oral BID Charmian Muff [place], RPH   40 mg at 08/05/13 2213  . sodium chloride 0.9 % injection 3 mL  3 mL Intravenous Q12H Clinton Gallant, MD   3 mL at 08/05/13 2215    PE: General appearance: alert, cooperative and no distress Lungs: clear to auscultation bilaterally Heart: regular rate and rhythm, S1, S2 normal, no murmur, click, rub or gallop Abd: obese soft, NT. BS+ Extremities: No LEE Pulses: 2+ and symmetric Skin: Warm and dry Neurologic: Grossly normal  Lab Results:   Recent Labs  08/04/13 1930 08/05/13 0925 08/06/13 0456  WBC 5.7 6.8 7.1  HGB 9.3* 10.0* 10.1*  HCT 27.3* 29.8* 30.5*  PLT 175 210 216   Cardiac Panel (last 3 results)  Recent Labs  08/03/13 1020  TROPONINI <0.30   Echo:Study Conclusions  - Left  ventricle: The cavity size was normal. Systolic function was normal. The estimated ejection fraction was in the range of 55% to 60%. Wall motion was normal; there were no regional wall motion abnormalities. Features are consistent with a pseudonormal left ventricular filling pattern, with concomitant abnormal relaxation and increased filling pressure (grade 2 diastolic dysfunction). - Aortic root: The aortic root was moderately dilated. - Left atrium: The atrium was mildly dilated.  Impressions:  - Technically difficult; definity used; normal LV function; grade 2 diastolic dysfunction; dilated aortic root (4.5 cm); suggest CTA or MRA to further assess.     Assessment/Plan  Active Problems:   Unstable angina In the setting of GIB(hgb as low as 8.6 and now 10). Negative troponin.  Back on beta blocker and long acting nitrates recommended.  EF is normal. Plan Lexiscan myoview today. If low risk would continue medical therapy with beta blocker and nitrates.    CAD (coronary artery disease)  s/p drug eluting Taxus stent (vessel uncertain) 10 years ago    Acute posthemorrhagic anemia Hgb stable this am.    Gastric ulcer with hemorrhage    LOS: 5 days    SignedCollier Salina [facility]   8:04 AM  08/06/2013

## 2013-08-06 NOTE — Progress Notes (Signed)
Subjective: NAEON. Pt set to have Myoview this AM. Had 1 BM overnight that was normal in appearance. No more gross bleeding. Hgb is stable at 10.1. Minimal to no chest pain.   Objective: Vital signs in last 24 hours: Filed Vitals:   08/05/13 1356 08/05/13 2053 08/05/13 2216 08/06/13 0446  BP: 113/76 119/74 107/67 114/70  Pulse: 77 76 71 66  Temp: 98.3 F (36.8 C) 98.3 F (36.8 C)  98.3 F (36.8 C)  TempSrc: Oral Oral  Oral  Resp: 16 16  16   Height:      Weight:      SpO2: 98% 98%  96%   Weight change:   Intake/Output Summary (Last 24 hours) at 08/06/13 0930 Last data filed at 08/05/13 1104  Gross per 24 hour  Intake      3 ml  Output      0 ml  Net      3 ml    General: lying in bed, obese, in no acute distress Heart: RRR, normal S1, S2, no murmurs, no pain on palpation of chest.  Lungs: Normal work of breathing, CTAB, no wheezes, rhonchi or crackles.  Abdominal: Normoactive bowel sounds, non-distended, mild suprapubic tenderness that has improved since yesterday. Skin: No new rashes or ulcers  Neuro: A&Ox3, no focal motor deficits  Lab Results: Basic Metabolic Panel:  Recent Labs  08/04/13 1108  MG 1.9   CBC:  Recent Labs  08/05/13 0925 08/06/13 0456  WBC 6.8 7.1  HGB 10.0* 10.1*  HCT 29.8* 30.5*  MCV 93.4 91.6  PLT 210 216   Cardiac Enzymes:  Recent Labs  08/03/13 1020  TROPONINI <0.30   Micro Results: Recent Results (from the past 240 hour(s))  MRSA PCR SCREENING     Status: None   Collection Time    08/01/13  2:30 PM      Result Value Ref Range Status   MRSA by PCR NEGATIVE  NEGATIVE Final   Comment:            The GeneXpert MRSA Assay (FDA     approved for NASAL specimens     only), is one component of a     comprehensive MRSA colonization     surveillance program. It is not     intended to diagnose MRSA     infection nor to guide or     monitor treatment for     MRSA infections.   Studies/Results: No results  found. Medications: I have reviewed the patient's current medications. Scheduled Meds: . fentaNYL  50 mcg Intravenous Once  . isosorbide mononitrate  30 mg Oral Daily  . metoprolol tartrate  25 mg Oral BID  . pantoprazole  40 mg Oral BID  . sodium chloride  3 mL Intravenous Q12H   Continuous Infusions:  PRN Meds:.ALPRAZolam, gi cocktail, ondansetron (ZOFRAN) IV, ondansetron Assessment/Plan:  This is a 63 yo make with PMH of CAD(with stent placed in 2005) who presented with acute upper GI bleed with EGD showing PUD. # Atypical Chest pain in setting of hx CAD and GIB: Pt does have hx of CAD s/p DES 2005 and negative myoview 2013. maybe demand ischemia in setting of acute GIB, no significant ECG changes, CXR normal. Pt no recent Echo but no known hx of CHF. Echo on 7/22 EF 61-44%, grade 2 diastolic -hold plavix and ASA until 2 wks per GI. Repeat troponin was negativen and EKG with no ischemic changes repeated on 08/03/13 -Myoview scheduled for  7/25 -cardiology consulted and recs greatly appreciated  # Acute GIB 2/2 PUD resolved: Pt presented with continued and witnessed hematochezia that was found to be FOBT + in the ED, UA negative for RBC, lactic acid was normal. Hgb baseline is usually 13>>12 and 8.6>>then pt transfused 1 U PRBC 2/2 ongoing chest pain thought to be 2/2 demand ischemia Hgb 9.6 this AM. EGD on 7/21 showed 2 large non bleeding ulcerations and no bx were taken before treatment and in the setting of recent plavix. H. Pylori testing still pending. Pt has not had any repeat hematochezia. Concern still have not identified nadir of acute blood loss. Transfusion for Hgb<7.0.  -cbc q12h  -transfuse if Hgb <7.0  -HH diet  -GI consult recs greatly recommended - no NSAIDS or ASA for 2 wks  -SCDs  -PPI BID   #Chronic Back Pain- Pt has had several pervious history back surgeries and reports taking valium and oxycodone for pain occasionally. Currently back pain is stable and no need for  pharmacologic management.  -transition to oral meds   Prophylaxis:  -SCDs  -No heparin give acute GI bleed   Diet: Regular diet   Dispo: Disposition is deferred at this time, awaiting improvement of current medical problems. Anticipated discharge in approximately 1-2 day(s).   The patient does have a current PCP Halford Chessman, MD) and does need an [facility] hospital follow-up appointment after discharge.   The patient does not have transportation limitations that hinder transportation to clinic appointments.   .Services Needed at time of discharge: Y = Yes, Blank = No PT:   OT:   RN:   Equipment:   Other:     LOS: 5 days   Clinton Gallant, MD 08/06/2013, 9:30 AM

## 2013-08-07 ENCOUNTER — Inpatient Hospital Stay (HOSPITAL_COMMUNITY): Payer: Medicare Other

## 2013-08-07 LAB — CBC
HCT: 30 % — ABNORMAL LOW (ref 39.0–52.0)
Hemoglobin: 10 g/dL — ABNORMAL LOW (ref 13.0–17.0)
MCH: 30.9 pg (ref 26.0–34.0)
MCHC: 33.3 g/dL (ref 30.0–36.0)
MCV: 92.6 fL (ref 78.0–100.0)
Platelets: 225 10*3/uL (ref 150–400)
RBC: 3.24 MIL/uL — ABNORMAL LOW (ref 4.22–5.81)
RDW: 16.3 % — ABNORMAL HIGH (ref 11.5–15.5)
WBC: 7.1 10*3/uL (ref 4.0–10.5)

## 2013-08-07 MED ORDER — ISOSORBIDE MONONITRATE 15 MG HALF TABLET: 15.0000 mg | ORAL_TABLET | Freq: Every day | ORAL | Status: DC

## 2013-08-07 MED ORDER — ISOSORBIDE MONONITRATE 15 MG HALF TABLET
15.0000 mg | ORAL_TABLET | Freq: Every day | ORAL | Status: DC
  Administered 2013-08-07: 15 mg via ORAL
  Filled 2013-08-07: qty 1

## 2013-08-07 MED ORDER — SODIUM CHLORIDE 0.9 % IV BOLUS (SEPSIS): 500.0000 mL | Freq: Once | INTRAVENOUS | Status: DC

## 2013-08-07 MED ORDER — ACETAMINOPHEN 325 MG PO TABS
650.0000 mg | ORAL_TABLET | ORAL | Status: DC | PRN
  Administered 2013-08-07 (×2): 650 mg via ORAL
  Filled 2013-08-07: qty 2

## 2013-08-07 MED ORDER — TECHNETIUM TC 99M SESTAMIBI GENERIC - CARDIOLITE: 30.0000 | Freq: Once | INTRAVENOUS | Status: DC | PRN

## 2013-08-07 NOTE — Progress Notes (Signed)
Was called by RN about his BP being low 106/60 manual P63. Went to evaluate patient.  Patient has frontal headache that slowly worsened. No dizziness, chest pain, SOB, n/v, vision changes. No bleeding per rectum or per urine or anywhere else. Has been having good UOP. Feeling fine other than the headache.   HEENT: eomi, PERRLA.  Lungs: CTAB Heart: RRR, no m/r/g Abd: soft, NT, ND.   Upon recheck of BP 107/68 MAP 81.   Ordered stat CBC to monitor hgb. Asked RN to recheck BP in 2 hours and call us. Changed tylenol to 650mg  q4hr PRN.  Will continue to monitor.

## 2013-08-07 NOTE — Progress Notes (Signed)
NURSING PROGRESS NOTE  Stephen Medina [id number] Discharge Data: 08/07/2013 6:02 PM Attending Provider: Madilyn Fireman, MD THY:HOOILNZ, Betsy Coder, MD     Jamse Mead Rigaud to be D/C'd Home per MD order.  Discussed with the patient the After Visit Summary and all questions fully answered. All IV's discontinued with no bleeding noted. All belongings returned to patient for patient to take home.   Last Vital Signs:  Blood pressure 102/64, pulse 64, temperature 97.7 F (36.5 C), temperature source Oral, resp. rate 19, height 6' (1.829 m), weight 145.6 kg (320 lb 15.8 oz), SpO2 95.00%.  Discharge Medication List   Medication List    STOP taking these medications       aspirin EC 325 MG tablet     clopidogrel 75 MG tablet  Commonly known as:  PLAVIX     quinapril-hydrochlorothiazide 20-12.5 MG per tablet  Commonly known as:  ACCURETIC      TAKE these medications       diazepam 5 MG tablet  Commonly known as:  VALIUM  Take 1 tablet (5 mg total) by mouth every 6 (six) hours as needed for muscle spasms.     isosorbide mononitrate 15 mg Tb24 24 hr tablet  Commonly known as:  IMDUR  Take 0.5 tablets (15 mg total) by mouth daily.     metoprolol tartrate 25 MG tablet  Commonly known as:  LOPRESSOR  Take 25 mg by mouth daily.     pantoprazole 40 MG tablet  Commonly known as:  PROTONIX  Take 1 tablet (40 mg total) by mouth 2 (two) times daily.     rosuvastatin 40 MG tablet  Commonly known as:  CRESTOR  Take 20 mg by mouth daily.         Wallie Renshaw, RN

## 2013-08-07 NOTE — Discharge Summary (Signed)
Name: Stephen Medina MRN: [id number] DOB: 1950-11-29 63 y.o. PCP: Halford Chessman, MD  Date of Admission: 08/01/2013  9:03 AM Date of Discharge: 08/07/2013 Attending Physician: Madilyn Fireman, MD  Discharge Diagnosis: 1. Acute GIB 2/2 PUD 2. CAD  Discharge Medications:   Medication List    STOP taking these medications       aspirin EC 325 MG tablet     clopidogrel 75 MG tablet  Commonly known as:  PLAVIX     quinapril-hydrochlorothiazide 20-12.5 MG per tablet  Commonly known as:  ACCURETIC      TAKE these medications       diazepam 5 MG tablet  Commonly known as:  VALIUM  Take 1 tablet (5 mg total) by mouth every 6 (six) hours as needed for muscle spasms.     isosorbide mononitrate 15 mg Tb24 24 hr tablet  Commonly known as:  IMDUR  Take 0.5 tablets (15 mg total) by mouth daily.     metoprolol tartrate 25 MG tablet  Commonly known as:  LOPRESSOR  Take 25 mg by mouth daily.     pantoprazole 40 MG tablet  Commonly known as:  PROTONIX  Take 1 tablet (40 mg total) by mouth 2 (two) times daily.     rosuvastatin 40 MG tablet  Commonly known as:  CRESTOR  Take 20 mg by mouth daily.        Disposition and follow-up:   Stephen Medina was discharged from [facility] in Stable condition.  At the hospital follow up visit please address:  1.  BP for need of restarting previous HTN medications  2.  Labs / imaging needed at time of follow-up: CBC  3.  Pending labs/ test needing follow-up: none  Follow-up Appointments: Follow-up Information   Follow up with Collene Mares, PA-C On 08/10/2013. (time 11:30 am)    Specialties:  Physician Assistant, Internal Medicine   Contact information:   [address] [place] [address] [phone]       Follow up with Pricilla Riffle. Fuller Plan, MD. Call in 2 months.   Specialty:  Gastroenterology   Contact information:   [address] [place]  [address] [phone]       Follow up with  Kerry Hough, MD On 08/19/2013. (at 9:30am)    Specialty:  Cardiology   Contact information:   [place] [address] [phone]       Discharge Instructions: Discharge Instructions   Diet - low sodium heart healthy    Complete by:  As directed      Diet - low sodium heart healthy    Complete by:  As directed      Increase activity slowly    Complete by:  As directed      Increase activity slowly    Complete by:  As directed            Consultations:  Cardiology   Procedures Performed:  Nm Myocar Multi W/spect W/wall Motion / Ef  08/07/2013   CLINICAL DATA:  Chest pain  EXAM: MYOCARDIAL IMAGING WITH SPECT (REST AND PHARMACOLOGIC-STRESS - 2 DAY PROTOCOL)  GATED LEFT VENTRICULAR WALL MOTION STUDY  LEFT VENTRICULAR EJECTION FRACTION  TECHNIQUE: Standard myocardial SPECT imaging was performed after resting intravenous injection of 10 mCi Tc-45m sestamibi. Subsequently, on a second day, intravenous infusion of Lexiscan was performed under the supervision of the Cardiology staff. At peak effect of the drug, 30 mCi  Tc-70m sestamibi was injected intravenously and standard myocardial SPECT imaging was performed. Quantitative gated imaging was also performed to evaluate left ventricular wall motion, and estimate left ventricular ejection fraction.  COMPARISON:  None.  FINDINGS: SPECT: Mild inducible or reversible perfusion defect noted within the mid inferior and lateral walls extending to the base concerning for ischemia.  Wall motion: Grossly normal wall motion and thickening  Ejection fraction: Calculated ejection fraction is 59%.  IMPRESSION: Findings compatible with mild inducible or reversible ischemia within the mid inferior and lateral walls extending to the base.  59% ejection fraction.   Electronically Signed   By: Daryll Brod M.D.   On: 08/07/2013 10:53   Dg Chest Port 1 View  08/01/2013   CLINICAL DATA:  Chest pain with shortness of breath with  history of cardiac stents ; rectal bleeding  EXAM: PORTABLE CHEST - 1 VIEW  COMPARISON:  PA and lateral chest x-ray of March 28, 2013  FINDINGS: The lungs are adequately inflated and clear. The heart and pulmonary vascularity are within the limits of normal. There is no pleural effusion. The mediastinum is normal in width. There is no pleural effusion or pneumothorax. The bony thorax is unremarkable.  IMPRESSION: There is no acute cardiopulmonary abnormality.   Electronically Signed   By: David  Martinique   On: 08/01/2013 09:22    2D Echo: EF 55-60%, - Left ventricle: The cavity size was normal. Systolic function was normal. The estimated ejection fraction was in the range of 55% to 60%. Wall motion was normal; there were no regional wall motion abnormalities. Features are consistent with a pseudonormal left ventricular filling pattern, with concomitant abnormal relaxation and increased filling pressure (grade 2 diastolic dysfunction). - Aortic root: The aortic root was moderately dilated. - Left atrium: The atrium was mildly dilated.  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  Admission HPI: Stephen Medina is a 63 yo male with PMH of CAD (stent placed in 2005), hypertension, chronic back pain, who presents to the hospital with black tarry stools that started two days ago. He was in his normal state of health until Saturday when he had a large bowel movement that was a "coffee ground" in color. His bms have decreased in size since then but have remained dark in color and occur every ~3 hours. Pt has had mild abdominal discomfort as well as an episode of substernal chest pain last night that did not radiate and lasted for 4-5 minutes in duration. He endorsed associated diaphoresis but no nausea. Since then has had several 5-6 second episodes of stabbing pain just left of his  sternum. In addition pt endorses feeling increasingly short of breath over the past couple days, even when walking short distances in his house. He also endorses feeling dizzy and having episodes of chills mixed with flushing. He takes 325 mgs aspirin daily and plavix 75 mg daily for his CAD. Pt reports taking an addition 325mg  of aspirin at least twice last night for chest pain/ gi discomfort. He denies nausea/vomiting, cough, weight loss, head aches, vision changes and urinary symptoms. He also denies any history of peptic ulcer/GI bleed  On admission to the ED pt had a BP of 107/74, Hr 106, and RR of 26 with Hgb of 12. Hemoccult was positive. His plavix was stopped and he received 2 L bolus NS. POC troponin was neg and ECG  showed tachycardia with TWI in aVL but no other major ECG findings. Chest x-ray was negative  Of note patient has had multiple back surgeries for chronic back pain with associated neuropathies. He had his most recent operation in April which was L4/5 decompression and arthrodesis with pedicle screw fixation. He reports recovering well from procedure.   Hospital Course by problem list: 1. Acute GI bleed 2/2 PUD: Patient was tachycardic with BP of 107/74 on admission, Hgb of 12 and BUN 48. He received 2 L of fluid in ED and was continued on fluids until stable. His home Plavix and aspirin were held and he was started on protonix 40 BID. On physical exam he had mild epigastric tenderness on palpation. Pt had EGD on 7/20 that showed two non-bleeding ulcers on the posterior wall of the stomach. Biopsies were not taken as pt had been on Plavix therapy. H. Pylori was negative. He will need a repeat endoscopy in 2-3 months to evaluate ulcer healing and rule out malignancy. During admission Hgb trended down from 12 to a nadir of 8.6. Pt received 1 unit of pRBC on 7/24 and his Hgb had improved to 10.0 the next day. At the time of discharge patients vitals were stable and he was tolerating a regular  diet. He should remain off his aspirin and Plavix for two weeks and at that point may take aspirin 81 mg daily. Pt should also continue Protonix 40 mg BID for 4 weeks and continue taking a PPI once a day long term. He will f/u with his PCP on 08/10/13 to recheck CBC.  2. Atypical Chest pain: Patient described having increased exertional chest pain for past 6 months as well as short episodes <10 secs of stabbing chest pain. The day before being admitted, he had a 5 minute episodes of substernal chest pain with left arm numbness that lasted 10 minutes and continue to have similar episodes of pain while hospitalized. His chest pain in the hospital was responsive to nitroglycerin. Admission ECG did show some T wave inversions along with tachycardia, but no other major changes. Repeat troponins and ECGs have remained normal except for newly developed 1st degree AV block. Mg level was checked and was WNL. He was evaluated by cardiology and chest pain was attributed to unstable angina that was worsened by recent anemia and beta blocker withdrawal.  His sharp episodes of stabbing pain are less likely cardiac in nature and more likely MSK related. Some of his symptoms could also be attributed to his PUD. He had ECHO and NM studies see (CAD) and will need f/u with primary cardiologist Dr. Oran Rein on 08/19/13.  3. CAD: History of drug eluting Taxus stent implanted (vessel unknown) at [facility] in 2005. November 2013; Cardiac Cath normal Left main, scattered irregularities LAD, 80% stenosis ostial Diag 1, scattered irregularities CFX, scattered irregularities RCA; LVEF of 75% documented via nuclear study on 11/27/2011. His Plavix, aspirin, metoprolol and quinapril-hydrochlorothiazide were held on admission. Patient had chest pain as described above and ECHO that showed normal LV function; grade 2 diastolic dysfunction, dilated aortic root (4.5 cm). NM perfusion study showed very mild ischemia in the mild inferior lateral  wall. Patient will remain off his aspirin and Plavix for two weeks from the date of his EGD (08/01/13). Blood pressure medication can be restarted as tolerated. Pt was restarted on metoprolol 25mg  BID and Imdur 15mg  BID upon discharge. He will follow up with Dr. Oran Rein on August 7th.  4. Chronic Back Pain-  Pt has had several pervious back surgeries with most recent one in April being a L4/5 decompression and arthrodesis with pedicle screw fixation. His back pain remained stable during admission and he should resume his home pain medications.   Discharge Vitals:   BP 102/64  Pulse 64  Temp(Src) 97.7 F (36.5 C) (Oral)  Resp 19  Ht 6' (1.829 m)  Wt 320 lb 15.8 oz (145.6 kg)  BMI 43.52 kg/m2  SpO2 95% General: sitting in bed, eating breakfast, in no acute distress  Heart: RRR, normal S1, S2, no murmurs, no pain on palpation of chest.  Lungs: Normal work of breathing, CTAB, no wheezes, rhonchi or crackles.  Abdominal: Normoactive bowel sounds, non-distended, non-tender.  Skin: No new rashes or ulcers  Neuro: A&Ox3, no focal motor deficits  Discharge Labs:  Results for orders placed during the hospital encounter of 08/01/13 (from the past 24 hour(s))  CBC     Status: Abnormal   Collection Time    08/07/13  1:22 AM      Result Value Ref Range   WBC 7.1  4.0 - 10.5 K/uL   RBC 3.24 (*) 4.22 - 5.81 MIL/uL   Hemoglobin 10.0 (*) 13.0 - 17.0 g/dL   HCT 30.0 (*) 39.0 - 52.0 %   MCV 92.6  78.0 - 100.0 fL   MCH 30.9  26.0 - 34.0 pg   MCHC 33.3  30.0 - 36.0 g/dL   RDW 16.3 (*) 11.5 - 15.5 %   Platelets 225  150 - 400 K/uL    Signed: Clinton Gallant, MD 08/07/2013, 5:59 PM    Services Ordered on Discharge: none Equipment Ordered on Discharge: none

## 2013-08-07 NOTE — Progress Notes (Signed)
Subjective: Pt had some asymptomatic low BP overnight. This likely in the setting of HTN meds being added back. Pt no longer having hematochezia. Denies CP, SOB, abdominal pain, dizziness, or HA.   Objective: Vital signs in last 24 hours: Filed Vitals:   08/06/13 2324 08/07/13 0057 08/07/13 0353 08/07/13 0455  BP: 96/59 107/68 109/71 104/64  Pulse: 64 59 57 72  Temp:   97.6 F (36.4 C) 97.9 F (36.6 C)  TempSrc:   Oral Oral  Resp:   18 18  Height:      Weight:      SpO2:   95% 98%   Weight change:   Intake/Output Summary (Last 24 hours) at 08/07/13 0719 Last data filed at 08/06/13 1800  Gross per 24 hour  Intake    866 ml  Output      0 ml  Net    866 ml    General: sitting up in bed, NAD obese Heart: RRR, normal S1, S2, no murmurs, no pain on palpation of chest.  Lungs: Normal work of breathing, CTAB, no wheezes, rhonchi or crackles.  Abdominal: Normoactive bowel sounds, non-distended, nontender Skin: No new rashes or ulcers  Neuro: A&Ox3, no focal motor deficits  Lab Results: Basic Metabolic Panel:  Recent Labs  08/04/13 1108  MG 1.9   CBC:  Recent Labs  08/06/13 0456 08/07/13 0122  WBC 7.1 7.1  HGB 10.1* 10.0*  HCT 30.5* 30.0*  MCV 91.6 92.6  PLT 216 225   Micro Results: Recent Results (from the past 240 hour(s))  MRSA PCR SCREENING     Status: None   Collection Time    08/01/13  2:30 PM      Result Value Ref Range Status   MRSA by PCR NEGATIVE  NEGATIVE Final   Comment:            The GeneXpert MRSA Assay (FDA     approved for NASAL specimens     only), is one component of a     comprehensive MRSA colonization     surveillance program. It is not     intended to diagnose MRSA     infection nor to guide or     monitor treatment for     MRSA infections.   Studies/Results: No results found. Medications: I have reviewed the patient's current medications. Scheduled Meds: . fentaNYL  50 mcg Intravenous Once  . isosorbide mononitrate  15  mg Oral Daily  . metoprolol tartrate  25 mg Oral BID  . pantoprazole  40 mg Oral BID  . sodium chloride  500 mL Intravenous Once  . sodium chloride  3 mL Intravenous Q12H   Continuous Infusions:  PRN Meds:.acetaminophen, ALPRAZolam, gi cocktail, ondansetron (ZOFRAN) IV, ondansetron Assessment/Plan:  This is a 63 yo make with PMH of CAD(with stent placed in 2005) who presented with acute upper GI bleed with EGD showing PUD. # Atypical Chest pain in setting of hx CAD and GIB: Pt does have hx of CAD s/p DES 2005 and negative myoview 2013. maybe demand ischemia in setting of acute GIB, no significant ECG changes, CXR normal. Pt no recent Echo but no known hx of CHF. Echo on 7/22 EF 12-24%, grade 2 diastolic -hold plavix and ASA until 2 wks per GI. Repeat troponin was negativen and EKG with no ischemic changes repeated on 08/03/13 -Myoview completed and awaiting read- if negative will discharge on medical management -cardiology consulted and recs greatly appreciated  # Acute GIB  2/2 PUD resolved: Pt presented with continued and witnessed hematochezia that was found to be FOBT + in the ED, UA negative for RBC, lactic acid was normal. Hgb baseline is usually 13>>12 and 8.6>>then pt transfused 1 U PRBC 2/2 ongoing chest pain thought to be 2/2 demand ischemia Hgb 9.6 this AM. EGD on 7/21 showed 2 large non bleeding ulcerations and no bx were taken before treatment and in the setting of recent plavix. H. Pylori testing still pending. Pt has not had any repeat hematochezia. Concern still have not identified nadir of acute blood loss. Transfusion for Hgb<7.0.  -cbc q24 -transfuse if Hgb <7.0  -HH diet  -GI consult recs greatly recommended - no NSAIDS or ASA for 2 wks  -SCDs  -PPI BID   #Chronic Back Pain- Pt has had several pervious history back surgeries and reports taking valium and oxycodone for pain occasionally. Currently back pain is stable and no need for pharmacologic management.  -transition to  oral meds   Prophylaxis:  -SCDs  -No heparin give acute GI bleed   Diet: Regular diet   Dispo: Disposition is deferred at this time, awaiting improvement of current medical problems. Anticipated discharge in approximately 1-2 day(s).   The patient does have a current PCP Halford Chessman, MD) and does need an [facility] hospital follow-up appointment after discharge.   The patient does not have transportation limitations that hinder transportation to clinic appointments.   .Services Needed at time of discharge: Y = Yes, Blank = No PT:   OT:   RN:   Equipment:   Other:     LOS: 6 days   Clinton Gallant, MD 08/07/2013, 7:19 AM

## 2013-08-07 NOTE — Progress Notes (Signed)
MD came to see pt at bedside and ordered to check BP with MAP. Rechecked bp is 107/68, pulse 59 and MAP 81 . Order received to hold  Normal saline bolus at this time and recheck bp after 2 hours. MD also aware that pt still has c/o headache.will cont to monitor.

## 2013-08-07 NOTE — Progress Notes (Signed)
Subjective: Pt had headache last night with no associated symptoms. His metoprolol was held given low blood pressure. Pt doing well this morning and denies chest pain, shortness of breath, abdominal pain. His headache has improved and he had a BM with no signs of bleeding yesterday.   Objective: Vital signs in last 24 hours: Filed Vitals:   08/06/13 2324 08/07/13 0057 08/07/13 0353 08/07/13 0455  BP: 96/59 107/68 109/71 104/64  Pulse: 64 59 57 72  Temp:   97.6 F (36.4 C) 97.9 F (36.6 C)  TempSrc:   Oral Oral  Resp:   18 18  Height:      Weight:      SpO2:   95% 98%   Weight change:   Intake/Output Summary (Last 24 hours) at 08/07/13 0913 Last data filed at 08/06/13 1800  Gross per 24 hour  Intake    866 ml  Output      0 ml  Net    866 ml    General: sitting in bed, eating breakfast, in no acute distress Heart: RRR, normal S1, S2, no murmurs, no pain on palpation of chest.  Lungs: Normal work of breathing, CTAB, no wheezes, rhonchi or crackles.  Abdominal: Normoactive bowel sounds, non-distended, non-tender. Skin: No new rashes or ulcers  Neuro: A&Ox3, no focal motor deficits  Lab Results: Basic Metabolic Panel:  Recent Labs  08/04/13 1108  MG 1.9   Liver Function Tests: No results found for this basename: AST, ALT, ALKPHOS, BILITOT, PROT, ALBUMIN,  in the last 72 hours No results found for this basename: LIPASE, AMYLASE,  in the last 72 hours No results found for this basename: AMMONIA,  in the last 72 hours CBC:  Recent Labs  08/06/13 0456 08/07/13 0122  WBC 7.1 7.1  HGB 10.1* 10.0*  HCT 30.5* 30.0*  MCV 91.6 92.6  PLT 216 225   Cardiac Enzymes: No results found for this basename: CKTOTAL, CKMB, CKMBINDEX, TROPONINI,  in the last 72 hours BNP: No results found for this basename: PROBNP,  in the last 72 hours Coagulation: No results found for this basename: LABPROT, INR,  in the last 72 hours Urinalysis: No results found for this basename:  COLORURINE, APPERANCEUR, LABSPEC, PHURINE, GLUCOSEU, HGBUR, BILIRUBINUR, KETONESUR, PROTEINUR, UROBILINOGEN, NITRITE, LEUKOCYTESUR,  in the last 72 hours  Micro Results: Recent Results (from the past 240 hour(s))  MRSA PCR SCREENING     Status: None   Collection Time    08/01/13  2:30 PM      Result Value Ref Range Status   MRSA by PCR NEGATIVE  NEGATIVE Final   Comment:            The GeneXpert MRSA Assay (FDA     approved for NASAL specimens     only), is one component of a     comprehensive MRSA colonization     surveillance program. It is not     intended to diagnose MRSA     infection nor to guide or     monitor treatment for     MRSA infections.   Studies/Results: No results found. Medications: I have reviewed the patient's current medications. Scheduled Meds: . fentaNYL  50 mcg Intravenous Once  . isosorbide mononitrate  15 mg Oral Daily  . metoprolol tartrate  25 mg Oral BID  . pantoprazole  40 mg Oral BID  . sodium chloride  500 mL Intravenous Once  . sodium chloride  3 mL Intravenous Q12H  Continuous Infusions:  PRN Meds:.acetaminophen, ALPRAZolam, gi cocktail, ondansetron (ZOFRAN) IV, ondansetron, technetium sestamibi generic Assessment/Plan:  Principal Problem:   GI bleed Active Problems:   CAD (coronary artery disease)   GIB (gastrointestinal bleeding)   Acute posthemorrhagic anemia   Gastric ulcer with hemorrhage  This is a 63 yo make with PMH of CAD(with stent placed in 2005) who presented with acute upper GI bleed with EGD showing PUD.  GI bleed w/coffee ground stools 2/2 PUD: Pt was tachycardic with BP of 107/74 on admission, Hgb of 12, BUN 48. S/p 2 L of fluid in ED. He had mild epigastric tenderness on palpation. Pt had EGD that showed two non-bleeding ulcers on the posterior wall of the stomach. Biopsies were not taken as pt had been on Plavix. H. Pylori was negative. He will need a repeat endoscopy in 2-3 months to evaluate ulcer healing and rule out  malignancy. He required bolus of fluid on 7/22 due to hypotension. Hgb trend  12->10->9.2->8.6->9.0->10. S/p one unit of blood on 7/23. Pt has responded well to transfusion and currently has no signs of bleeding. His Hgb has remained ~10 for past 3 days although BP continues to be low at times.  - Avoid ASA/NSAIDs for two weeks. Then ok to resume ASA 81 mg -continue to hold Plavix  - maintain IV access -transfuse if Hgb<7    - heart healthy diet  - Strict I&Os   - Protonix 40 mg BID (continue PPI BID for 4 weeks. Then PPI once a day long term) - schedule f/u GI endoscopy for 2-3 months  Chest pain: Initial episode of substernal chest pain, with diaphoresis that improved after 5 minutes could be cardiac in nature given stress 2/2 to blood loss. Admission ECG did show some T wave inversion along with tachycardia, but no other major changes. Sharp episodes of stabbing pain are less likely cardiac in nature and more likely MSK related. Some of symptoms could be attributed to his PUD. Patient has continued to have episodes of chest pain while hospitalized that are 15-20 minute in duration and responsive to nitroglycerin. Repeat troponins and ECGs have remained normal except for newly developed 1st degree AV block with a few nonspecific T-wave inversions. Mg was WNL. ECHO showed EF 57-84%, grade 2 diastolic dysfunction. Pt completing 2 day of Myoview study today.   - Cardiology consulted, appreciate recs - Continuous teli - started back on metoprolol  - Decrease Imdur to 15 mg daily  - F/u NM study. If positive may need to go to cath lab.   CAD: Pt has a history of coronary artery disease with previous stenting in 2005 and had a negative nuclear perfusion scan two years ago. Per pt, he had 95 occulusion of one of his coronary arteries.  - hold Plavix and aspirin for now  - continuous telemetry as above - BP management as above. - Has f/u with Dr. Oran Rein on Aug 7th.  Chronic Back Pain- Pt has had  several pervious back surgeries and reports taking valium and oxycodone for pain occasionally. Currently back pain is stable and no need for pharmacologic management.   Prophylaxis:  -SCDs  -No heparin give acute GI bleed   Diet: Heart healthy   Dispo: Disposition is deferred at this time, awaiting improvement of current medical problems. Anticipated discharge in approximately 1-2 day(s).   The patient does have a current PCP Halford Chessman, MD) and does need an [facility] hospital follow-up appointment after discharge.   The  patient does not have transportation limitations that hinder transportation to clinic appointments.   .Services Needed at time of discharge: Y = Yes, Blank = No PT:   OT:   RN:   Equipment:   Other:     LOS: 6 days   Azucena Cecil, Med Student 08/07/2013, 9:13 AM

## 2013-08-07 NOTE — Progress Notes (Signed)
I have seen and examined the patient myself, and I have reviewed the note by Katherene Ponto, MS 4 and was present during the interview and physical exam.  Please see my separate H&P for additional findings, assessment, and plan.   Signed: Clinton Gallant, MD 08/07/2013, 9:51 AM

## 2013-08-07 NOTE — Progress Notes (Addendum)
Subjective: No bleeding noted. No active chest pain.  Objective: Vital signs in last 24 hours: Temp:  [97.6 F (36.4 C)-98 F (36.7 C)] 97.9 F (36.6 C) (07/26 0455) Pulse Rate:  [57-95] 72 (07/26 0455) Resp:  [16-18] 18 (07/26 0455) BP: (96-138)/(49-80) 104/64 mmHg (07/26 0455) SpO2:  [95 %-98 %] 98 % (07/26 0455) Last BM Date: 08/05/13  Intake/Output from previous day: 07/25 0701 - 07/26 0700 In: 866 [P.O.:866] Out: -  Intake/Output this shift:    Medications Current Facility-Administered Medications  Medication Dose Route Frequency Provider Last Rate Last Dose  . acetaminophen (TYLENOL) tablet 650 mg  650 mg Oral Q4H PRN Dellia Nims, MD   650 mg at 08/07/13 0218  . ALPRAZolam (XANAX) tablet 0.25 mg  0.25 mg Oral TID PRN Clinton Gallant, MD      . fentaNYL (SUBLIMAZE) injection 50 mcg  50 mcg Intravenous Once Neta Ehlers, MD      . gi cocktail (Maalox,Lidocaine,Donnatal)  30 mL Oral BID PRN Clinton Gallant, MD      . isosorbide mononitrate (IMDUR) 24 hr tablet 15 mg  15 mg Oral Daily Cari Vandeberg M Martinique, MD      . metoprolol tartrate (LOPRESSOR) tablet 25 mg  25 mg Oral BID Sanda Klein, MD   25 mg at 08/06/13 1249  . ondansetron (ZOFRAN) tablet 4 mg  4 mg Oral Q6H PRN Clinton Gallant, MD       Or  . ondansetron [facility]) injection 4 mg  4 mg Intravenous Q6H PRN Clinton Gallant, MD      . pantoprazole (PROTONIX) EC tablet 40 mg  40 mg Oral BID Charmian Muff Burnside, RPH   40 mg at 08/06/13 2212  . sodium chloride 0.9 % bolus 500 mL  500 mL Intravenous Once Duwaine Maxin, DO      . sodium chloride 0.9 % injection 3 mL  3 mL Intravenous Q12H Clinton Gallant, MD   3 mL at 08/06/13 2213    PE: General appearance: alert, cooperative and no distress Lungs: clear to auscultation bilaterally Heart: regular rate and rhythm, S1, S2 normal, no murmur, click, rub or gallop Abd: obese soft, NT. BS+ Extremities: No LEE Pulses: 2+ and symmetric Skin: Warm and dry Neurologic: Grossly normal  Lab  Results:   Recent Labs  08/05/13 0925 08/06/13 0456 08/07/13 0122  WBC 6.8 7.1 7.1  HGB 10.0* 10.1* 10.0*  HCT 29.8* 30.5* 30.0*  PLT 210 216 225   Cardiac Panel (last 3 results) No results found for this basename: CKTOTAL, CKMB, TROPONINI, RELINDX,  in the last 72 hours Echo:Study Conclusions  - Left ventricle: The cavity size was normal. Systolic function was normal. The estimated ejection fraction was in the range of 55% to 60%. Wall motion was normal; there were no regional wall motion abnormalities. Features are consistent with a pseudonormal left ventricular filling pattern, with concomitant abnormal relaxation and increased filling pressure (grade 2 diastolic dysfunction). - Aortic root: The aortic root was moderately dilated. - Left atrium: The atrium was mildly dilated.  Impressions:  - Technically difficult; definity used; normal LV function; grade 2 diastolic dysfunction; dilated aortic root (4.5 cm); suggest CTA or MRA to further assess.     Assessment/Plan  Active Problems:   Unstable angina In the setting of GIB(hgb as low as 8.6 and now 10). Negative troponin.  Back on beta blocker and long acting nitrates recommended. Lopressor held last pm due to low BP. Will decrease Imdur to 15  mg daily. EF is normal. Plan to complete 2 day Lexiscan myoview today. If low risk would continue medical therapy with beta blocker and nitrates.    CAD (coronary artery disease)  s/p drug eluting Taxus stent (vessel uncertain) 10 years ago    Acute posthemorrhagic anemia Hgb stable this am.    Gastric ulcer with hemorrhage    LOS: 6 days    Signed: Collier Salina [facility] [place]   7:09 AM  08/07/2013   Addendum: I reviewed personally the Nuclear stress images. There is perhaps very mild ischemia in the mid inferolateral wall. This is a low risk study and based on these findings I would treat medically. OK for DC from a cardiac standpoint. Can follow up with Dr.Tilley as  outpatient.  Saifullah Jolley [place] MD, [facility]

## 2013-08-11 NOTE — Discharge Summary (Signed)
I had discussed the plan for discharge of this patient with Dr Algis Liming on Friday 08/05/13. I was not present on the day of discharge in the hospital. I have reviewed the documentation and I agree with the plan of care outlined by Dr Algis Liming in the discharge summary.

## 2013-08-16 ENCOUNTER — Other Ambulatory Visit (HOSPITAL_COMMUNITY): Payer: Self-pay | Admitting: Family Medicine

## 2013-08-16 ENCOUNTER — Ambulatory Visit (HOSPITAL_COMMUNITY)
Admission: RE | Admit: 2013-08-16 | Discharge: 2013-08-16 | Disposition: A | Discharge status: Home / Self Care | Payer: Medicare Other | Source: Ambulatory Visit | Attending: Family Medicine | Admitting: Family Medicine

## 2013-08-16 DIAGNOSIS — M7730 Calcaneal spur, unspecified foot: Secondary | ICD-10-CM | POA: Insufficient documentation

## 2013-08-16 DIAGNOSIS — M25572 Pain in left ankle and joints of left foot: Secondary | ICD-10-CM

## 2013-08-16 DIAGNOSIS — M25476 Effusion, unspecified foot: Secondary | ICD-10-CM | POA: Diagnosis not present

## 2013-08-16 DIAGNOSIS — M7989 Other specified soft tissue disorders: Secondary | ICD-10-CM

## 2013-08-16 DIAGNOSIS — S82409A Unspecified fracture of shaft of unspecified fibula, initial encounter for closed fracture: Secondary | ICD-10-CM | POA: Insufficient documentation

## 2013-08-16 DIAGNOSIS — M25579 Pain in unspecified ankle and joints of unspecified foot: Secondary | ICD-10-CM | POA: Diagnosis present

## 2013-08-16 DIAGNOSIS — X58XXXA Exposure to other specified factors, initial encounter: Secondary | ICD-10-CM | POA: Insufficient documentation

## 2013-08-16 DIAGNOSIS — M25473 Effusion, unspecified ankle: Secondary | ICD-10-CM | POA: Insufficient documentation

## 2013-09-09 ENCOUNTER — Encounter: Payer: Self-pay | Admitting: Gastroenterology

## 2013-09-16 ENCOUNTER — Telehealth: Payer: Self-pay | Admitting: Gastroenterology

## 2013-09-16 NOTE — Telephone Encounter (Signed)
Patient can be scheduled in the [facility].  He was inpatient at the time of the last EGD

## 2013-09-23 ENCOUNTER — Encounter: Payer: Self-pay | Admitting: Gastroenterology

## 2013-09-23 NOTE — Telephone Encounter (Signed)
appt set up with pt

## 2013-10-14 ENCOUNTER — Ambulatory Visit (AMBULATORY_SURGERY_CENTER): Payer: Self-pay

## 2013-10-14 VITALS — Ht 71.0 in | Wt 315.0 lb

## 2013-10-14 DIAGNOSIS — K277 Chronic peptic ulcer, site unspecified, without hemorrhage or perforation: Secondary | ICD-10-CM

## 2013-10-14 NOTE — Progress Notes (Signed)
No allergies to eggs or soy No past problems with anesthesia No diet/weight loss meds No home oxygen  Has email  Emmi instructions given for endoscopy 

## 2013-10-19 ENCOUNTER — Telehealth: Payer: Self-pay | Admitting: *Deleted

## 2013-10-19 ENCOUNTER — Encounter: Payer: Self-pay | Admitting: Gastroenterology

## 2013-10-19 ENCOUNTER — Other Ambulatory Visit (INDEPENDENT_AMBULATORY_CARE_PROVIDER_SITE_OTHER): Payer: Medicare Other

## 2013-10-19 ENCOUNTER — Ambulatory Visit (INDEPENDENT_AMBULATORY_CARE_PROVIDER_SITE_OTHER): Payer: Medicare Other | Admitting: Gastroenterology

## 2013-10-19 VITALS — BP 138/80 | HR 67 | Ht 72.0 in | Wt 317.0 lb

## 2013-10-19 DIAGNOSIS — Z87898 Personal history of other specified conditions: Secondary | ICD-10-CM

## 2013-10-19 DIAGNOSIS — K259 Gastric ulcer, unspecified as acute or chronic, without hemorrhage or perforation: Secondary | ICD-10-CM

## 2013-10-19 HISTORY — DX: Personal history of other specified conditions: Z87.898

## 2013-10-19 LAB — CBC WITH DIFFERENTIAL/PLATELET
Basophils Absolute: 0 10*3/uL (ref 0.0–0.1)
Basophils Relative: 0.6 % (ref 0.0–3.0)
Eosinophils Absolute: 0.1 10*3/uL (ref 0.0–0.7)
Eosinophils Relative: 1.6 % (ref 0.0–5.0)
HCT: 36.5 % — ABNORMAL LOW (ref 39.0–52.0)
Hemoglobin: 12 g/dL — ABNORMAL LOW (ref 13.0–17.0)
Lymphocytes Relative: 35.1 % (ref 12.0–46.0)
Lymphs Abs: 2.4 10*3/uL (ref 0.7–4.0)
MCHC: 32.9 g/dL (ref 30.0–36.0)
MCV: 87.9 fl (ref 78.0–100.0)
Monocytes Absolute: 0.6 10*3/uL (ref 0.1–1.0)
Monocytes Relative: 9.5 % (ref 3.0–12.0)
Neutro Abs: 3.6 10*3/uL (ref 1.4–7.7)
Neutrophils Relative %: 53.2 % (ref 43.0–77.0)
Platelets: 241 10*3/uL (ref 150.0–400.0)
RBC: 4.15 Mil/uL — ABNORMAL LOW (ref 4.22–5.81)
RDW: 14.6 % (ref 11.5–15.5)
WBC: 6.8 10*3/uL (ref 4.0–10.5)

## 2013-10-19 NOTE — Patient Instructions (Addendum)
You have been scheduled for an endoscopy. Please follow written instructions given to you at your visit today. If you use inhalers (even only as needed), please bring them with you on the day of your procedure. Your physician has requested that you go to www.startemmi.com and enter the access code given to you at your visit today. This web site gives a general overview about your procedure. However, you should still follow specific instructions given to you by our office regarding your preparation for the procedure.  Your physician has requested that you go to the basement for the following lab work before leaving today: CBC  Continue to hold your Plavix, I will let Dr. Wynonia Lawman know

## 2013-10-19 NOTE — Telephone Encounter (Signed)
10/19/2013   RE: Stephen Medina DOB: 1950/01/30 MRN: [id number]   Dear Dr. Wynonia Lawman,    We have scheduled the above patient for an endoscopic procedure. Our records show that he is on anticoagulation therapy.   The patient has already stopped his Plavix on his own, is it okay for him to stay off of Plavix until procedure that is scheduled for 10-25-2013.  Please route the completed form to Evette Georges., CMA   Sincerely,    Hope Pigeon    Faxed letter to: [phone] Phone number:[phone]

## 2013-10-19 NOTE — Progress Notes (Signed)
Reviewed and agree with management plan.  Malcolm T. Stark, MD FACG 

## 2013-10-19 NOTE — Progress Notes (Signed)
     10/19/2013 Stephen Medina [id number] 1950/08/11   History of Present Illness:  This is a 64 year old male who was seen in consult by our services, Dr. Fuller Plan, back in July for upper GI bleed. He underwent EGD on July 21 was found to have 2 non-bleeding gastric ulcers in the posterior wall of the gastric antrum. He was found to be H. pylori negative. Was placed on pantoprazole 40 mg twice daily, which he is still on currently. It was recommended that he have a repeat EGD in 2-3 months to reassess for healing of his gastric ulcers. He was seen in pre-visit and scheduled for the EGD on October 13 before they realized that he was on Plavix. He was then scheduled for this appointment today for further discussion. The patient has taken upon himself to stop his Plavix yesterday in anticipation for this procedure. He says that he has stopped it on several occasions in the past for other procedures and surgeries. His cardiologist is Dr. Wynonia Lawman. He says that he is anticipating needing a cardiac catheterization, however, Dr. Wynonia Lawman wants him to have the EGD first.  No repeat labs/Hgb have been drawn since his hospitalization; last Hgb was 10.0 grams.  I am unaware that patient has ever undergone colonoscopy.  This will need to be addressed with him in the future and will need to be scheduled if needed, but can be done once he has his cardiac catheterization.   Current Medications, Allergies, Past Medical History, Past Surgical History, Family History and Social History were reviewed in [employer] record.   Physical Exam: BP 138/80  Pulse 67  Ht 6' (1.829 m)  Wt 317 lb (143.79 kg)  BMI 42.98 kg/m2 General: Well developed white male in no acute distress Head: Normocephalic and atraumatic Eyes:  Sclerae anicteric, conjunctiva pink  Ears: Normal auditory acuity Lungs: Clear throughout to auscultation Heart: Regular rate and rhythm Abdomen: Soft, obese, non-distended.  Normal  bowel sounds.  Non-tender. Musculoskeletal: Symmetrical with no gross deformities  Extremities: No edema  Neurological: Alert oriented x 4, grossly non-focal Psychological:  Alert and cooperative. Normal mood and affect  Assessment and Recommendations: -History of gastric ulcer:  2 Gastric ulcers seen on EGD 07/2013.  Hpylori negative.  Needs repeat EGD to reassess for healing.  He is already scheduled on 10/13 and has taken it upon himself to stop his Plavix yesterday.  We will contact Dr. Wynonia Lawman and make him aware.  He will continue pantoprazole 40 mg BID for now, but can likely be decrease to once daily after EGD is performed.  The risks, benefits, and alternatives were discussed with the patient and he consents to proceed.  -Blood loss anemia:  Secondary to UGI from above stated GU.  Will recheck a CBC today.  *Colonoscopy to be discussed in the future.

## 2013-10-25 ENCOUNTER — Ambulatory Visit (AMBULATORY_SURGERY_CENTER): Payer: Medicare Other | Admitting: Gastroenterology

## 2013-10-25 ENCOUNTER — Encounter: Payer: Self-pay | Admitting: Gastroenterology

## 2013-10-25 VITALS — BP 135/66 | HR 65 | Temp 96.6°F | Resp 28 | Ht 72.0 in | Wt 317.0 lb

## 2013-10-25 DIAGNOSIS — K229 Disease of esophagus, unspecified: Secondary | ICD-10-CM

## 2013-10-25 DIAGNOSIS — L089 Local infection of the skin and subcutaneous tissue, unspecified: Secondary | ICD-10-CM

## 2013-10-25 DIAGNOSIS — K253 Acute gastric ulcer without hemorrhage or perforation: Secondary | ICD-10-CM

## 2013-10-25 MED ORDER — PANTOPRAZOLE SODIUM 40 MG PO TBEC: 40.0000 mg | DELAYED_RELEASE_TABLET | Freq: Every day | ORAL | Status: DC

## 2013-10-25 MED ORDER — SODIUM CHLORIDE 0.9 % IV SOLN: 500.0000 mL | INTRAVENOUS | Status: DC

## 2013-10-25 NOTE — Patient Instructions (Signed)
Discharge instructions given with verbal understanding. Biopsies taken. Resume previous medications. YOU HAD AN ENDOSCOPIC PROCEDURE TODAY AT THE [facility]: Refer to the procedure report that was given to you for any specific questions about what was found during the examination.  If the procedure report does not answer your questions, please call your gastroenterologist to clarify.  If you requested that your care partner not be given the details of your procedure findings, then the procedure report has been included in a sealed envelope for you to review at your convenience later.  YOU SHOULD EXPECT: Some feelings of bloating in the abdomen. Passage of more gas than usual.  Walking can help get rid of the air that was put into your GI tract during the procedure and reduce the bloating. If you had a lower endoscopy (such as a colonoscopy or flexible sigmoidoscopy) you may notice spotting of blood in your stool or on the toilet paper. If you underwent a bowel prep for your procedure, then you may not have a normal bowel movement for a few days.  DIET: Your first meal following the procedure should be a light meal and then it is ok to progress to your normal diet.  A half-sandwich or bowl of soup is an example of a good first meal.  Heavy or fried foods are harder to digest and may make you feel nauseous or bloated.  Likewise meals heavy in dairy and vegetables can cause extra gas to form and this can also increase the bloating.  Drink plenty of fluids but you should avoid alcoholic beverages for 24 hours.  ACTIVITY: Your care partner should take you home directly after the procedure.  You should plan to take it easy, moving slowly for the rest of the day.  You can resume normal activity the day after the procedure however you should NOT DRIVE or use heavy machinery for 24 hours (because of the sedation medicines used during the test).    SYMPTOMS TO REPORT IMMEDIATELY: A gastroenterologist  can be reached at any hour.  During normal business hours, 8:30 AM to 5:00 PM Monday through Friday, call [phone].  After hours and on weekends, please call the GI answering service at [phone] who will take a message and have the physician on call contact you.   Following upper endoscopy (EGD)  Vomiting of blood or coffee ground material  New chest pain or pain under the shoulder blades  Painful or persistently difficult swallowing  New shortness of breath  Fever of 100F or higher  Black, tarry-looking stools  FOLLOW UP: If any biopsies were taken you will be contacted by phone or by letter within the next 1-3 weeks.  Call your gastroenterologist if you have not heard about the biopsies in 3 weeks.  Our staff will call the home number listed on your records the next business day following your procedure to check on you and address any questions or concerns that you may have at that time regarding the information given to you following your procedure. This is a courtesy call and so if there is no answer at the home number and we have not heard from you through the emergency physician on call, we will assume that you have returned to your regular daily activities without incident.  SIGNATURES/CONFIDENTIALITY: You and/or your care partner have signed paperwork which will be entered into your electronic medical record.  These signatures attest to the fact that that the information above on your After   Visit Summary has been reviewed and is understood.  Full responsibility of the confidentiality of this discharge information lies with you and/or your care-partner. 

## 2013-10-25 NOTE — Progress Notes (Signed)
Called to room to assist during endoscopic procedure.  Patient ID and intended procedure confirmed with present staff. Received instructions for my participation in the procedure from the performing physician.  

## 2013-10-25 NOTE — Op Note (Signed)
[place]  [address] [place], [id number]   ENDOSCOPY PROCEDURE REPORT  PATIENT: Stephen Medina, Stephen Medina  MR#: [id number] BIRTHDATE: 11-28-1950 , 63  yrs. old GENDER: male ENDOSCOPIST: Ladene Artist, MD, Marval Regal REFERRED BY:  Sharilyn Sites, M.D. PROCEDURE DATE:  10/25/2013 PROCEDURE:  EGD w/ biopsy ASA CLASS:     Class III INDICATIONS:  Follow up of gastric ulcers. MEDICATIONS: Monitored anesthesia care and Propofol 300 mg IV TOPICAL ANESTHETIC: none DESCRIPTION OF PROCEDURE: After the risks benefits and alternatives of the procedure were thoroughly explained, informed consent was obtained.  The LB [id number] endoscope was introduced through the mouth and advanced to the second portion of the duodenum , Without limitations.  The instrument was slowly withdrawn as the mucosa was fully examined.    ESOPHAGUS: Variable z-line at EG junction.  Multiple biopsies obtained.   The esophagus was otherwise normal. STOMACH: The mucosa and folds of the stomach appeared normal except for mild erythema in the antrum at the site of the prior ulcers. The ulcer have completely healed. DUODENUM: The duodenal mucosa showed no abnormalities in the bulb and 2nd part of the duodenum.  Retroflexed views revealed no abnormalities.  The scope was then withdrawn from the patient and the procedure completed.  COMPLICATIONS: There were no immediate complications.  ENDOSCOPIC IMPRESSION: 1.   Variable z-line at EG junction; multiple biopsies obtained 2.   Mild antral erythema at site of prior ulcers  RECOMMENDATIONS: 1.  Anti-reflux regimen long term 2.  Await pathology results 3.  Continue PPI qam indefinitely; pantoprazole 40 mg po qam, 1 year of refills 4.  Resume Plavix tomorrow 5.  Schedule screening colonoscopy after clearance from his cardiologist  eSigned:  Ladene Artist, MD, [facility] 10/25/2013 2:29 PM

## 2013-10-25 NOTE — Progress Notes (Signed)
Report to PACU, RN, vss, BBS= Clear.  

## 2013-10-26 ENCOUNTER — Telehealth: Payer: Self-pay | Admitting: *Deleted

## 2013-10-26 NOTE — Telephone Encounter (Signed)
No answer, left message to call if questions or concerns. 

## 2013-11-01 ENCOUNTER — Encounter: Payer: Self-pay | Admitting: Gastroenterology

## 2014-01-25 ENCOUNTER — Ambulatory Visit (HOSPITAL_COMMUNITY)
Admission: RE | Admit: 2014-01-25 | Discharge: 2014-01-25 | Disposition: A | Discharge status: Home / Self Care | Payer: Medicare Other | Source: Ambulatory Visit | Attending: Physician Assistant | Admitting: Physician Assistant

## 2014-01-25 ENCOUNTER — Other Ambulatory Visit (HOSPITAL_COMMUNITY): Payer: Self-pay | Admitting: Physician Assistant

## 2014-01-25 DIAGNOSIS — M25562 Pain in left knee: Secondary | ICD-10-CM

## 2014-02-02 ENCOUNTER — Encounter: Payer: Self-pay | Admitting: Cardiology

## 2014-02-02 DIAGNOSIS — I10 Essential (primary) hypertension: Secondary | ICD-10-CM | POA: Insufficient documentation

## 2014-02-02 DIAGNOSIS — E785 Hyperlipidemia, unspecified: Secondary | ICD-10-CM | POA: Insufficient documentation

## 2014-02-02 DIAGNOSIS — I208 Other forms of angina pectoris: Secondary | ICD-10-CM

## 2014-02-02 DIAGNOSIS — I25118 Atherosclerotic heart disease of native coronary artery with other forms of angina pectoris: Secondary | ICD-10-CM

## 2014-02-02 HISTORY — DX: Essential (primary) hypertension: I10
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
## 2014-02-09 ENCOUNTER — Encounter (HOSPITAL_COMMUNITY): Payer: Self-pay | Admitting: Cardiology

## 2014-02-09 ENCOUNTER — Encounter (HOSPITAL_COMMUNITY)
Admission: RE | Disposition: A | Discharge status: Home / Self Care | Payer: Self-pay | Source: Ambulatory Visit | Attending: Cardiology

## 2014-02-09 ENCOUNTER — Ambulatory Visit (HOSPITAL_COMMUNITY)
Admission: RE | Admit: 2014-02-09 | Discharge: 2014-02-09 | Disposition: A | Discharge status: Home / Self Care | Payer: Medicare Other | Source: Ambulatory Visit | Attending: Cardiology | Admitting: Cardiology

## 2014-02-09 DIAGNOSIS — I2584 Coronary atherosclerosis due to calcified coronary lesion: Secondary | ICD-10-CM | POA: Diagnosis not present

## 2014-02-09 DIAGNOSIS — I25118 Atherosclerotic heart disease of native coronary artery with other forms of angina pectoris: Secondary | ICD-10-CM | POA: Diagnosis not present

## 2014-02-09 DIAGNOSIS — Z91040 Latex allergy status: Secondary | ICD-10-CM | POA: Insufficient documentation

## 2014-02-09 DIAGNOSIS — E785 Hyperlipidemia, unspecified: Secondary | ICD-10-CM | POA: Insufficient documentation

## 2014-02-09 DIAGNOSIS — Z6841 Body Mass Index (BMI) 40.0 and over, adult: Secondary | ICD-10-CM | POA: Diagnosis not present

## 2014-02-09 DIAGNOSIS — I208 Other forms of angina pectoris: Secondary | ICD-10-CM | POA: Diagnosis present

## 2014-02-09 DIAGNOSIS — Z888 Allergy status to other drugs, medicaments and biological substances status: Secondary | ICD-10-CM | POA: Insufficient documentation

## 2014-02-09 DIAGNOSIS — I10 Essential (primary) hypertension: Secondary | ICD-10-CM | POA: Diagnosis not present

## 2014-02-09 DIAGNOSIS — R079 Chest pain, unspecified: Secondary | ICD-10-CM | POA: Diagnosis present

## 2014-02-09 DIAGNOSIS — Z882 Allergy status to sulfonamides status: Secondary | ICD-10-CM | POA: Diagnosis not present

## 2014-02-09 DIAGNOSIS — Z951 Presence of aortocoronary bypass graft: Secondary | ICD-10-CM | POA: Insufficient documentation

## 2014-02-09 DIAGNOSIS — Z8249 Family history of ischemic heart disease and other diseases of the circulatory system: Secondary | ICD-10-CM | POA: Insufficient documentation

## 2014-02-09 DIAGNOSIS — I2510 Atherosclerotic heart disease of native coronary artery without angina pectoris: Secondary | ICD-10-CM | POA: Diagnosis present

## 2014-02-09 HISTORY — PX: LEFT HEART CATHETERIZATION WITH CORONARY ANGIOGRAM: SHX5451

## 2014-02-09 LAB — PROTIME-INR
INR: 0.98 (ref 0.00–1.49)
Prothrombin Time: 13.1 seconds (ref 11.6–15.2)

## 2014-02-09 LAB — CBC
HCT: 36.6 % — ABNORMAL LOW (ref 39.0–52.0)
Hemoglobin: 12.3 g/dL — ABNORMAL LOW (ref 13.0–17.0)
MCH: 28.1 pg (ref 26.0–34.0)
MCHC: 33.6 g/dL (ref 30.0–36.0)
MCV: 83.8 fL (ref 78.0–100.0)
Platelets: 237 10*3/uL (ref 150–400)
RBC: 4.37 MIL/uL (ref 4.22–5.81)
RDW: 14.8 % (ref 11.5–15.5)
WBC: 9.7 10*3/uL (ref 4.0–10.5)

## 2014-02-09 LAB — BASIC METABOLIC PANEL
Anion gap: 8 (ref 5–15)
BUN: 11 mg/dL (ref 6–23)
CO2: 23 mmol/L (ref 19–32)
Calcium: 8.9 mg/dL (ref 8.4–10.5)
Chloride: 105 mmol/L (ref 96–112)
Creatinine, Ser: 0.92 mg/dL (ref 0.50–1.35)
GFR calc Af Amer: >90 mL/min (ref >90)
GFR calc non Af Amer: 89 mL/min — ABNORMAL LOW (ref >90)
Glucose, Bld: 110 mg/dL — ABNORMAL HIGH (ref 70–99)
Potassium: 4.1 mmol/L (ref 3.5–5.1)
Sodium: 136 mmol/L (ref 135–145)

## 2014-02-09 SURGERY — LEFT HEART CATHETERIZATION WITH CORONARY ANGIOGRAM
Anesthesia: LOCAL

## 2014-02-09 MED ORDER — FENTANYL CITRATE 0.05 MG/ML IJ SOLN
INTRAMUSCULAR | Status: AC
  Filled 2014-02-09: qty 2

## 2014-02-09 MED ORDER — SODIUM CHLORIDE 0.9 % IV SOLN
INTRAVENOUS | Status: DC
  Administered 2014-02-09: 08:00:00 via INTRAVENOUS

## 2014-02-09 MED ORDER — NITROGLYCERIN 1 MG/10 ML FOR IR/CATH LAB
INTRA_ARTERIAL | Status: AC
  Filled 2014-02-09: qty 10

## 2014-02-09 MED ORDER — SODIUM CHLORIDE 0.9 % IJ SOLN: 3.0000 mL | Freq: Two times a day (BID) | INTRAMUSCULAR | Status: DC

## 2014-02-09 MED ORDER — SODIUM CHLORIDE 0.9 % IV SOLN: 250.0000 mL | INTRAVENOUS | Status: DC | PRN

## 2014-02-09 MED ORDER — MIDAZOLAM HCL 2 MG/2ML IJ SOLN
INTRAMUSCULAR | Status: AC
  Filled 2014-02-09: qty 2

## 2014-02-09 MED ORDER — SODIUM CHLORIDE 0.9 % IV SOLN: 1.0000 mL/kg/h | INTRAVENOUS | Status: DC

## 2014-02-09 MED ORDER — HEPARIN (PORCINE) IN NACL 2-0.9 UNIT/ML-% IJ SOLN
INTRAMUSCULAR | Status: AC
  Filled 2014-02-09: qty 1000

## 2014-02-09 MED ORDER — HEPARIN SODIUM (PORCINE) 1000 UNIT/ML IJ SOLN
INTRAMUSCULAR | Status: AC
  Filled 2014-02-09: qty 1

## 2014-02-09 MED ORDER — ASPIRIN 81 MG PO CHEW: 81.0000 mg | CHEWABLE_TABLET | ORAL | Status: DC

## 2014-02-09 MED ORDER — SODIUM CHLORIDE 0.9 % IJ SOLN: 3.0000 mL | INTRAMUSCULAR | Status: DC | PRN

## 2014-02-09 MED ORDER — VERAPAMIL HCL 2.5 MG/ML IV SOLN
INTRAVENOUS | Status: AC
  Filled 2014-02-09: qty 2

## 2014-02-09 MED ORDER — LIDOCAINE HCL (PF) 1 % IJ SOLN
INTRAMUSCULAR | Status: AC
  Filled 2014-02-09: qty 30
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
## 2014-02-09 NOTE — Interval H&P Note (Signed)
History and Physical Interval Note:  02/09/2014 8:46 AM  Stephen Medina  has presented today for surgery, with the diagnosis of unstable angina  The various methods of treatment have been discussed with the patient and family. After consideration of risks, benefits and other options for treatment, the patient has consented to  Procedure(s): LEFT HEART CATHETERIZATION WITH CORONARY ANGIOGRAM (N/A) as a surgical intervention .  The patient's history has been reviewed, patient examined, no change in status, stable for surgery.  I have reviewed the patient's chart and labs.  Questions were answered to the patient's satisfaction.   Cath Lab Visit (complete for each Cath Lab visit)  Clinical Evaluation Leading to the Procedure:   ACS: No.  Non-ACS:    Anginal Classification: CCS III  Anti-ischemic medical therapy: Maximal Therapy (2 or more classes of medications)  Non-Invasive Test Results: No non-invasive testing performed  Prior CABG: No previous CABG        Collier Salina [facility] 02/09/2014 8:46 AM

## 2014-02-09 NOTE — CV Procedure (Addendum)
    Cardiac Catheterization Procedure Note  Name: Stephen Medina MRN: [id number] DOB: 10-19-1950  Procedure: Left Heart Cath, Selective Coronary Angiography, LV angiography  Indication: 64 yo WM presents with increased symptoms of dyspnea and chest pain on exertion. Prior stress test suggested inferior ischemia.   Procedural Details: The right wrist was prepped, draped, and anesthetized with 1% lidocaine. Using the modified Seldinger technique, a 6 French slender sheath was introduced into the right radial artery. 3 mg of verapamil was administered through the sheath, weight-based unfractionated heparin was administered intravenously. Standard Judkins catheters were used for selective coronary angiography and left ventriculography. Catheter exchanges were performed over an exchange length guidewire. There were no immediate procedural complications. A TR band was used for radial hemostasis at the completion of the procedure.  The patient was transferred to the post catheterization recovery area for further monitoring.  Procedural Findings: Hemodynamics: AO 119/62 mean 86 mm Hg LV 118/13 mm Hg  Coronary angiography: Coronary dominance: right  Left mainstem: Normal  Left anterior descending (LAD): The LAD has mild diffuse calcification in the proximal vessel. There are mild irregularities less than 20%. The first diagonal has a stent in place and it is patent. The second diagonal is a bifurcating vessel with 20% ostial narrowing.  Left circumflex (LCx): The LCx gives rise to 3 OM branches. It is normal.  Right coronary artery (RCA): There is moderate calcification in the proximal RCA with focal 30% stenosis.   Left ventriculography: Left ventricular systolic function is normal, LVEF is estimated at 55-65%, there is no significant mitral regurgitation. The proximal aortic root appears enlarged.   Final Conclusions:   1. Nonobstructive CAD, patent stent in the first diagonal. 2. Normal LV  function.  Recommendations: continue medical therapy. Consider CT to assess Aortic root.   [name], [place]  02/09/2014, 9:26 AM

## 2014-02-09 NOTE — Discharge Instructions (Signed)
Radial Site Care °Refer to this sheet in the next few weeks. These instructions provide you with information on caring for yourself after your procedure. Your caregiver may also give you more specific instructions. Your treatment has been planned according to current medical practices, but problems sometimes occur. Call your caregiver if you have any problems or questions after your procedure. °HOME CARE INSTRUCTIONS °· You may shower the day after the procedure. Remove the bandage (dressing) and gently wash the site with plain soap and water. Gently pat the site dry. °· Do not apply powder or lotion to the site. °· Do not submerge the affected site in water for 3 to 5 days. °· Inspect the site at least twice daily. °· Do not flex or bend the affected arm for 24 hours. °· No lifting over 5 pounds (2.3 kg) for 5 days after your procedure. °· Do not drive home if you are discharged the same day of the procedure. Have someone else drive you. °· You may drive 24 hours after the procedure unless otherwise instructed by your caregiver. °· Do not operate machinery or power tools for 24 hours. °· A responsible adult should be with you for the first 24 hours after you arrive home. °What to expect: °· Any bruising will usually fade within 1 to 2 weeks. °· Blood that collects in the tissue (hematoma) may be painful to the touch. It should usually decrease in size and tenderness within 1 to 2 weeks. °SEEK IMMEDIATE MEDICAL CARE IF: °· You have unusual pain at the radial site. °· You have redness, warmth, swelling, or pain at the radial site. °· You have drainage (other than a small amount of blood on the dressing). °· You have chills. °· You have a fever or persistent symptoms for more than 72 hours. °· You have a fever and your symptoms suddenly get worse. °· Your arm becomes pale, cool, tingly, or numb. °· You have heavy bleeding from the site. Hold pressure on the site and call 911. °Document Released: 02/01/2010 Document  Revised: 03/24/2011 Document Reviewed: 02/01/2010 °ExitCare® Patient Information ©2015 [employer]. This information is not intended to replace advice given to you by your health care provider. Make sure you discuss any questions you have with your health care provider. ° °

## 2014-03-02 ENCOUNTER — Other Ambulatory Visit: Payer: Self-pay | Admitting: Cardiology

## 2014-03-02 DIAGNOSIS — I712 Thoracic aortic aneurysm, without rupture, unspecified: Secondary | ICD-10-CM

## 2014-03-06 ENCOUNTER — Other Ambulatory Visit: Payer: Self-pay | Admitting: Cardiology

## 2014-03-06 ENCOUNTER — Ambulatory Visit
Admission: RE | Admit: 2014-03-06 | Discharge: 2014-03-06 | Disposition: A | Discharge status: Home / Self Care | Payer: Medicare Other | Source: Ambulatory Visit | Attending: Cardiology | Admitting: Cardiology

## 2014-03-06 ENCOUNTER — Other Ambulatory Visit: Payer: Medicare Other

## 2014-03-06 DIAGNOSIS — I712 Thoracic aortic aneurysm, without rupture, unspecified: Secondary | ICD-10-CM

## 2014-04-13 ENCOUNTER — Encounter: Payer: Self-pay | Admitting: Podiatry

## 2014-04-13 ENCOUNTER — Ambulatory Visit (INDEPENDENT_AMBULATORY_CARE_PROVIDER_SITE_OTHER): Payer: Medicare Other

## 2014-04-13 ENCOUNTER — Ambulatory Visit (INDEPENDENT_AMBULATORY_CARE_PROVIDER_SITE_OTHER): Payer: Medicare Other | Admitting: Podiatry

## 2014-04-13 VITALS — BP 116/69 | HR 59

## 2014-04-13 DIAGNOSIS — M79672 Pain in left foot: Secondary | ICD-10-CM

## 2014-04-13 DIAGNOSIS — Q6652 Congenital pes planus, left foot: Secondary | ICD-10-CM | POA: Diagnosis not present

## 2014-04-13 DIAGNOSIS — M779 Enthesopathy, unspecified: Secondary | ICD-10-CM | POA: Diagnosis not present

## 2014-04-13 MED ORDER — TRIAMCINOLONE ACETONIDE 10 MG/ML IJ SUSP
10.0000 mg | Freq: Once | INTRAMUSCULAR | Status: AC
  Administered 2014-04-13: 10 mg

## 2014-04-13 NOTE — Progress Notes (Signed)
   Subjective:    Patient ID: Stephen Medina, male    DOB: [birth year], 64 y.o.   MRN: [id number]  HPI Pt presents with painful knot on bottom of his left foot   Review of Systems  Musculoskeletal: Positive for back pain, arthralgias and gait problem.  All other systems reviewed and are negative.      Objective:   Physical Exam        Assessment & Plan:

## 2014-04-13 NOTE — Progress Notes (Signed)
Subjective:     Patient ID: Stephen Medina, male   DOB: [birth year], 64 y.o.   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      Objective:   Physical Exam  Constitutional: He is oriented to person, place, and time.  Cardiovascular: Intact distal pulses.   Musculoskeletal: Normal range of motion.  Neurological: He is oriented to person, place, and time.  Skin: Skin is warm and dry.  Nursing note and vitals reviewed.  neurovascular status was found to be intact with diminishment of sharp dull and vibratory upon specific testing left foot. Significant collapse medial longitudinal arch left and is walking on the inside of the ankle and has navicular prominence with inflammation plantarly secondary to foot structure. There is keratotic tissue formation secondary to the pressure against this area and patient does have well-perfused digits     Assessment:     Collapse medial longitudinal arch left creating inflammation pain and keratotic tissue formation with congenital and acquired flatfoot deformity noted    Plan:     H&P and x-ray reviewed. Today I did a careful injection around the area to try to reduce inflammation and debrided tissue. I've recommended [place] bracing to try to lift the arch up and prevent him from needing extensive reconstructive surgery which I do not think is appropriate for him currently but may be necessary at one point in the future. Very hopeful brace will be of great benefit to him

## 2014-04-26 ENCOUNTER — Ambulatory Visit: Payer: Medicare Other | Admitting: *Deleted

## 2014-04-26 DIAGNOSIS — M779 Enthesopathy, unspecified: Secondary | ICD-10-CM

## 2014-04-27 NOTE — Progress Notes (Signed)
Patient ID: Stephen Medina, male   DOB: 06-22-1950, 64 y.o.   MRN: [id number] PATIENT PRESENTS FOR CASTING OF [place] BRACE WITH BETHA

## 2014-05-10 ENCOUNTER — Encounter: Payer: Self-pay | Admitting: Podiatry

## 2014-05-10 ENCOUNTER — Ambulatory Visit (INDEPENDENT_AMBULATORY_CARE_PROVIDER_SITE_OTHER): Payer: Medicare Other | Admitting: Podiatry

## 2014-05-10 DIAGNOSIS — Q6652 Congenital pes planus, left foot: Secondary | ICD-10-CM

## 2014-05-10 DIAGNOSIS — L97529 Non-pressure chronic ulcer of other part of left foot with unspecified severity: Secondary | ICD-10-CM

## 2014-05-10 DIAGNOSIS — L89891 Pressure ulcer of other site, stage 1: Secondary | ICD-10-CM

## 2014-05-10 DIAGNOSIS — M79672 Pain in left foot: Secondary | ICD-10-CM

## 2014-05-10 MED ORDER — AMOXICILLIN-POT CLAVULANATE 875-125 MG PO TABS: 1.0000 | ORAL_TABLET | Freq: Two times a day (BID) | ORAL | Status: DC

## 2014-05-11 NOTE — Progress Notes (Signed)
Subjective:     Patient ID: Stephen Medina, male   DOB: 1950-01-24, 64 y.o.   MRN: [id number]  HPI patient states that he's been on his left foot a lot and it's getting more irritated and that he wants to see if there is anything else that can be done. States that there is been no real drainage but there was a mild amount of redness around it and he's not had any systemic indications of infection   Review of Systems     Objective:   Physical Exam Neurovascular status found to be intact with history of severe back issues with probable neuropathic changes and collapse and medial longitudinal arch left over right and is essentially walking on the inside of his left foot. There is a small area of breakdown within this area that's localized with no proximal edema erythema or drainage noted. I didn't not no drainage when debridement was accomplished but there is some localized tissue necrosis    Assessment:     Pressure phenomena on the medial side the left foot leading to irritation of tissue with mild localized breakdown and no indications of proximal spread or systemic infection    Plan:     Educated him on if he should develop any fever or any systemic indications of infection he is to go straight to the emergency room and at this time debrided the area fully flushed and applied a worse over sterile dressing and padding to reduce pressure against this. Placed on Augmentin 875 twice a day and gave him strict instructions on what to do if any changes should occur and reappoint in 1 week for reevaluation

## 2014-05-17 ENCOUNTER — Encounter: Payer: Self-pay | Admitting: Podiatry

## 2014-05-17 ENCOUNTER — Ambulatory Visit (INDEPENDENT_AMBULATORY_CARE_PROVIDER_SITE_OTHER): Payer: Medicare Other | Admitting: Podiatry

## 2014-05-17 VITALS — BP 131/82 | HR 72 | Temp 98.0°F | Resp 15

## 2014-05-17 DIAGNOSIS — Q6652 Congenital pes planus, left foot: Secondary | ICD-10-CM

## 2014-05-17 DIAGNOSIS — L89891 Pressure ulcer of other site, stage 1: Secondary | ICD-10-CM | POA: Diagnosis not present

## 2014-05-17 DIAGNOSIS — L97529 Non-pressure chronic ulcer of other part of left foot with unspecified severity: Secondary | ICD-10-CM

## 2014-05-17 NOTE — Progress Notes (Signed)
Subjective:     Patient ID: Stephen Medina, male   DOB: 03/15/1950, 64 y.o.   MRN: [id number]  HPI patient presents stating the area still bothers him and there has been some breakdown of tissue but there is not been any redness or warmth or other indications of infection around this area   Review of Systems     Objective:   Physical Exam Neurovascular status diminished with severe flatfoot deformity left secondary to previous back surgeries with nerve damage. There is a small breakdown of tissue in the arch itself measuring approximately 5 x 5 mm that is localized with approximate 1 mm of depth noted. There is no odor there is no proximal edema erythema or drainage noted from the area    Assessment:     Localized ulceration secondary to severe flatfoot deformity neuropathy and obesity    Plan:     Reviewed that at this time there does not appear to be infection but the sides to look for any of them to occur to go straight to the emergency room flushed the area out today and went ahead today and applied Iodosorb with sterile dressing and instructed on leaving this on several days and then be getting home care. Patient will be seen back 2 weeks earlier if any issues should occur and we will be dispensing him a air zone a brace to try to support the medial longitudinal arch

## 2014-05-20 ENCOUNTER — Other Ambulatory Visit: Payer: Self-pay | Admitting: Podiatry

## 2014-05-22 ENCOUNTER — Other Ambulatory Visit: Payer: Self-pay | Admitting: Podiatry

## 2014-05-23 ENCOUNTER — Encounter: Payer: Self-pay | Admitting: Podiatry

## 2014-05-24 ENCOUNTER — Other Ambulatory Visit: Payer: Medicare Other

## 2014-05-24 MED ORDER — CADEXOMER IODINE 0.9 % EX GEL: 1.0000 "application " | Freq: Every day | CUTANEOUS | Status: DC | PRN

## 2014-05-24 MED ORDER — AMOXICILLIN-POT CLAVULANATE 875-125 MG PO TABS: 1.0000 | ORAL_TABLET | Freq: Two times a day (BID) | ORAL | Status: DC

## 2014-05-24 NOTE — Addendum Note (Signed)
Addended by: Clovis Riley E on: 05/24/2014 09:06 AM   Modules accepted: Orders

## 2014-05-30 ENCOUNTER — Other Ambulatory Visit: Payer: Medicare Other

## 2014-05-31 ENCOUNTER — Encounter: Payer: Self-pay | Admitting: Podiatry

## 2014-05-31 ENCOUNTER — Ambulatory Visit (INDEPENDENT_AMBULATORY_CARE_PROVIDER_SITE_OTHER): Payer: Medicare Other | Admitting: Podiatry

## 2014-05-31 VITALS — BP 137/94 | HR 74 | Resp 15

## 2014-05-31 DIAGNOSIS — L97529 Non-pressure chronic ulcer of other part of left foot with unspecified severity: Secondary | ICD-10-CM

## 2014-05-31 DIAGNOSIS — Q6652 Congenital pes planus, left foot: Secondary | ICD-10-CM

## 2014-06-01 NOTE — Progress Notes (Signed)
Subjective:     Patient ID: Stephen Medina, male   DOB: April 13, 1950, 64 y.o.   MRN: [id number]  HPI patient presents stating it seems to be doing better as far as the opening but I still have a lot of pain on the side of my foot   Review of Systems     Objective:   Physical Exam Neurovascular status unchanged with significant collapse medial longitudinal arch left with a small opening on the left plantar arch measuring approximately 3 x 3 mm with diminished size and drying out quite significantly. There is no erythema drainage edema surrounding the area and it does appear to be much healthier than previous visit    Assessment:     Severe collapse medial longitudinal arch left with a small area that continues to heal but is showing excellent progress    Plan:     Advised on physical therapy soaks and applied padding with Iodosorb around the area and dispensed Iodosorb to him to use to hopefully finish the site up. If this should give him any trouble patient will be seen back and I told him the signs to look for as far as redness drainage pain or other issues

## 2014-06-21 ENCOUNTER — Other Ambulatory Visit (INDEPENDENT_AMBULATORY_CARE_PROVIDER_SITE_OTHER): Payer: Medicare Other

## 2014-06-21 DIAGNOSIS — M2142 Flat foot [pes planus] (acquired), left foot: Secondary | ICD-10-CM | POA: Diagnosis not present

## 2014-06-21 DIAGNOSIS — M216X2 Other acquired deformities of left foot: Secondary | ICD-10-CM | POA: Diagnosis not present

## 2014-06-28 ENCOUNTER — Other Ambulatory Visit: Payer: Medicare Other

## 2014-07-04 ENCOUNTER — Ambulatory Visit (INDEPENDENT_AMBULATORY_CARE_PROVIDER_SITE_OTHER): Payer: Medicare Other | Admitting: Podiatry

## 2014-07-04 ENCOUNTER — Encounter: Payer: Self-pay | Admitting: Podiatry

## 2014-07-04 VITALS — BP 110/64 | HR 67 | Temp 95.9°F | Resp 18

## 2014-07-04 DIAGNOSIS — M779 Enthesopathy, unspecified: Secondary | ICD-10-CM | POA: Diagnosis not present

## 2014-07-04 DIAGNOSIS — L89891 Pressure ulcer of other site, stage 1: Secondary | ICD-10-CM

## 2014-07-04 DIAGNOSIS — L97529 Non-pressure chronic ulcer of other part of left foot with unspecified severity: Secondary | ICD-10-CM

## 2014-07-04 MED ORDER — TRIAMCINOLONE ACETONIDE 10 MG/ML IJ SUSP
10.0000 mg | Freq: Once | INTRAMUSCULAR | Status: AC
  Administered 2014-07-04: 10 mg

## 2014-07-04 MED ORDER — HYDROCODONE-ACETAMINOPHEN 10-325 MG PO TABS: 1.0000 | ORAL_TABLET | Freq: Three times a day (TID) | ORAL | Status: DC | PRN

## 2014-07-05 NOTE — Progress Notes (Signed)
Subjective:     Patient ID: Stephen Medina, male   DOB: 07-09-1950, 64 y.o.   MRN: [id number]  HPI patient presents stating I'm getting pain in my left foot that does not seem to be related to the area of irritation underneath but it is quite sore for me and makes it hard to walk. 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 I then went ahead and I debrided the area that's small and open applied a small amount of Iodosorb with sterile dressing and applied compression to the area. Continue home care reappoint in 1 week or earlier if any redness drainage or any other indications of problems were to occur

## 2014-07-12 ENCOUNTER — Encounter: Payer: Self-pay | Admitting: Podiatry

## 2014-07-12 ENCOUNTER — Ambulatory Visit (INDEPENDENT_AMBULATORY_CARE_PROVIDER_SITE_OTHER): Payer: Medicare Other | Admitting: Podiatry

## 2014-07-12 VITALS — BP 120/74 | HR 69 | Temp 96.6°F | Resp 15

## 2014-07-12 DIAGNOSIS — Q6652 Congenital pes planus, left foot: Secondary | ICD-10-CM

## 2014-07-12 DIAGNOSIS — L89891 Pressure ulcer of other site, stage 1: Secondary | ICD-10-CM

## 2014-07-12 DIAGNOSIS — L97529 Non-pressure chronic ulcer of other part of left foot with unspecified severity: Secondary | ICD-10-CM

## 2014-07-13 NOTE — Progress Notes (Signed)
Subjective:     Patient ID: Stephen Medina, male   DOB: 22-Feb-1950, 64 y.o.   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 It measures approximately 7 mm x 1 cm and there is subcutaneous exposure but it is healing significantly from previous visit with no odor or no drainage noted. There is not pain anywhere in this area but there is some pain in the arch more in the lateral side which does not appear to be related to the breakdown of tissue    Assessment:     Ulceration left plantar arch that is continuing to improve with local care    Plan:     Discussed with him the importance of continued limited weightbearing on his foot and at this time using sharp sterile instrumentation debridement accomplished the area was flushed and I applied Iodosorb to the area with sterile dressing. I dispensed Iodosorb for him to use at home along with padding to keep pressure off the area and he will reappoint in 2 weeks to reevaluate this and less any issues were to occur. I explained if he should develop any redness any odor or any drainage or systemic signs of infection even of the area is not hurting he is to go straight to the emergency room and contact us

## 2014-07-26 ENCOUNTER — Ambulatory Visit (INDEPENDENT_AMBULATORY_CARE_PROVIDER_SITE_OTHER): Payer: Medicare Other | Admitting: Podiatry

## 2014-07-26 ENCOUNTER — Ambulatory Visit (INDEPENDENT_AMBULATORY_CARE_PROVIDER_SITE_OTHER): Payer: Medicare Other

## 2014-07-26 ENCOUNTER — Encounter: Payer: Self-pay | Admitting: Podiatry

## 2014-07-26 VITALS — BP 136/82 | HR 68 | Resp 15

## 2014-07-26 DIAGNOSIS — L97529 Non-pressure chronic ulcer of other part of left foot with unspecified severity: Secondary | ICD-10-CM

## 2014-07-26 DIAGNOSIS — Q6652 Congenital pes planus, left foot: Secondary | ICD-10-CM

## 2014-07-26 DIAGNOSIS — L89891 Pressure ulcer of other site, stage 1: Secondary | ICD-10-CM

## 2014-07-26 DIAGNOSIS — M779 Enthesopathy, unspecified: Secondary | ICD-10-CM | POA: Diagnosis not present

## 2014-07-26 MED ORDER — AMOXICILLIN-POT CLAVULANATE 875-125 MG PO TABS: 1.0000 | ORAL_TABLET | Freq: Two times a day (BID) | ORAL | Status: DC

## 2014-07-26 MED ORDER — TRIAMCINOLONE ACETONIDE 10 MG/ML IJ SUSP
10.0000 mg | Freq: Once | INTRAMUSCULAR | Status: AC
  Administered 2014-07-26: 10 mg

## 2014-07-26 NOTE — Progress Notes (Signed)
Subjective:     Patient ID: SLADE PIERPOINT, male   DOB: May 03, 1950, 64 y.o.   MRN: [id number]  HPI patient states the drainage seems to be reducing but is still present in his left arch and it's not hurting there but it's hurting in his left ankle. States she's been trying to keep off his foot is best as possible but he still needs to work and be ambulatory   Review of Systems     Objective:   Physical Exam Neurovascular status unchanged with severe flatfoot deformity noted left with a plantar breakdown of tissue measuring approximately 5 x 5 mm on the plantar surface left arch with very superficial drainage and no deep exposure or subcutaneous exposure. 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 Has quite a bit of pain in the left ankle with no redness or extension noted to this area    Assessment:     Severe flatfoot deformity with obesity and neuropathy as complicating factors with small superficial ulceration which appears to be gradually healing. Probable ankle capsulitis secondary to severe structural deformity    Plan:     Reviewed both problems separately and did debridement of the tissue on the left plantar arch flushed the area and applied Iodosorb with thick padding around the area to take pressure off of it. Patient will continue home Neosporin or Iodosorb usage and padding and I did go ahead and injected the left ankle 3 mg Kenalog 5 mg Xylocaine Marcaine mixture. I then went ahead and did place him on Augmentin 875 twice a day as precautionary measure due to the continued opening of the lesion area reappoint for Dr. Jacqualyn Posey to reevaluate in 1 week when I'll be out of town to see any ideas he may have to how we get final closure of the ulceration

## 2014-08-04 ENCOUNTER — Encounter: Payer: Self-pay | Admitting: Podiatry

## 2014-08-04 ENCOUNTER — Ambulatory Visit (INDEPENDENT_AMBULATORY_CARE_PROVIDER_SITE_OTHER): Payer: Medicare Other | Admitting: Podiatry

## 2014-08-04 VITALS — BP 110/63 | HR 69 | Resp 18

## 2014-08-04 DIAGNOSIS — L97529 Non-pressure chronic ulcer of other part of left foot with unspecified severity: Secondary | ICD-10-CM

## 2014-08-04 DIAGNOSIS — L89891 Pressure ulcer of other site, stage 1: Secondary | ICD-10-CM

## 2014-08-07 ENCOUNTER — Telehealth: Payer: Self-pay | Admitting: *Deleted

## 2014-08-07 NOTE — Telephone Encounter (Addendum)
[employer] Prior Authorization form faxed.  Dr. Jacqualyn Posey states if the Regranex is not covered after appealing 2 times then pt can continue with the Silvadene Cream and keep his 08/21/2014 appt.  Orders called to pt and he states understanding.  Ordered refill of silvadene cream.

## 2014-08-08 ENCOUNTER — Encounter: Payer: Self-pay | Admitting: Podiatry

## 2014-08-08 NOTE — Progress Notes (Signed)
Patient ID: Stephen Medina, male   DOB: 04/15/1950, 64 y.o.   MRN: [id number]  Subjective:  64 year old male presents the office today for follow-up evaluation of ulceration on the left medial arch of his foot. He states that he continues to change the dressing daily with either Neosporin or iodosorb. This wound has been ongoing for several months without much improvement. He does state that there has been draining some bloody, drainage however denies any pus. He's been taking Augmentin. Denies any surrounding redness or any drainage. He denies any systemic complaints as fevers, chills, nausea, vomiting. Denies any calf pain, chest pain, shards of breath. No other complaints at this time and no acute changes since last appointment.   Objective: AAO x3, NAD DP/PT pulses palpable, CRT less than 3 seconds Protective sensation decreased with Simms Weinstein monofilament on the plantar aspect the left medial arch of the foot there is an annular ulceration with hyperkeratotic periwound. After debridement the wound measures approximately 1.1 x 0.5 cm. There is no probe to bone, undermining, tunneling. There is no surrounding erythema, ascending cellulitis, fluctuance, crepitus, malodor, drainage/purulence. No other open lesions or pre-ulcerative lesions identified. No other areas of tenderness bilateral lower extremity. There is no overlying edema, erythema, increased warmth to bilateral lower x-rays. No pain with calf compression, swelling, warmth, erythema.  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 -Treatment options discussed include alternatives, risks, competitions. -The wound was debrided without complications to healthy, bleeding, granular wound base. Iodosorb was applied followed by dry sterile dressing. -At this time as this would've been ongoing for several months and he is tried multiple treatments I believe that  he would benefit from Regranax. This was ordered today. -Continue with offloading. -He may in the future need exostectomy of the plantar foot to help alleviate the pressure in order to get the wounds to heal. Also this may help prevent recurrence in the future. -Monitor for any clinical signs or symptoms of infection and directed to call the office immediately should any occur or go to the ER. -Follow-up 2 weeks or sooner if any problems arise. In the meantime, encouraged to call the office with any questions, concerns, change in symptoms.   Celesta Gentile, DPM

## 2014-08-10 NOTE — Telephone Encounter (Signed)
"  I'm calling in regards to a medication appeal for Regranex that I received from [place].  Is his foot ulcer a Diabetic skin ulcer and  is it from systemic sclerosis?  You can fax me the information to [phone].  I'd greatly appreciate it."

## 2014-08-18 MED ORDER — SILVER SULFADIAZINE 1 % EX CREA: 2.0000 "application " | TOPICAL_CREAM | Freq: Every day | CUTANEOUS | Status: DC

## 2014-08-21 ENCOUNTER — Encounter: Payer: Self-pay | Admitting: Podiatry

## 2014-08-21 ENCOUNTER — Ambulatory Visit (INDEPENDENT_AMBULATORY_CARE_PROVIDER_SITE_OTHER): Payer: Medicare Other | Admitting: Podiatry

## 2014-08-21 VITALS — BP 145/96 | HR 86 | Resp 12

## 2014-08-21 DIAGNOSIS — L97529 Non-pressure chronic ulcer of other part of left foot with unspecified severity: Secondary | ICD-10-CM

## 2014-08-21 DIAGNOSIS — L97501 Non-pressure chronic ulcer of other part of unspecified foot limited to breakdown of skin: Secondary | ICD-10-CM | POA: Diagnosis not present

## 2014-08-21 NOTE — Patient Instructions (Signed)
Continue daily dressing changes. Monitor for any signs/symptoms of infection. Call the office immediately if any occur or go directly to the emergency room. Call with any questions/concerns.  

## 2014-08-21 NOTE — Progress Notes (Signed)
Patient ID: Stephen Medina, male   DOB: Aug 14, 1950, 64 y.o.   MRN: [id number]  Subjective: Stephen Medina presents the office today for follow-up evaluation of ulceration on the left medial arch of his foot. He states that he continues to change the dressing daily with silvadene. He does state that there has been draining some bloody drainage however denies any pus. Denies any surrounding redness or any drainage. He denies any systemic complaints as fevers, chills, nausea, vomiting. Denies any calf pain, chest pain, shards of breath. No other complaints at this time and no acute changes since last appointment.   Objective: AAO x3, NAD DP/PT pulses palpable, CRT less than 3 seconds Protective sensation decreased with Simms Weinstein monofilament on the plantar aspect the left medial arch of the foot there is an annular ulceration with hyperkeratotic periwound. After debridement the wound measures approximately 1 x 0.7 cm with a new smaller ulcer medial to this on the medial portion of the foot measuring about 0.2 x 0.2 cm. There is no probe to bone, undermining, tunneling of either ulceration. There is no surrounding erythema, ascending cellulitis, fluctuance, crepitus, malodor, drainage/purulence. No other open lesions or pre-ulcerative lesions identified. No other areas of tenderness bilateral lower extremity. There is no overlying edema, erythema, increased warmth to bilateral lower x-rays. No pain with calf compression, swelling, warmth, erythema.  Assessment: 64 year old male left chronic plantar foot ulceration  Plan: -Treatment options discussed include alternatives, risks, competitions. -The wound was debrided without complications to healthy, bleeding, granular wound base. Iodosorb was applied followed by dry sterile dressing.Continue with silvadene dressing changes daily. -Continue with offloading/surgical shoe.  -Monitor for any clinical signs or symptoms of infection and directed to call the  office immediately should any occur or go to the ER. -Follow-up 2 weeks or sooner if any problems arise. In the meantime, encouraged to call the office with any questions, concerns, change in symptoms.   Celesta Gentile, DPM

## 2014-09-04 ENCOUNTER — Ambulatory Visit: Payer: Medicare Other | Admitting: Podiatry

## 2014-09-11 ENCOUNTER — Ambulatory Visit (INDEPENDENT_AMBULATORY_CARE_PROVIDER_SITE_OTHER): Payer: Medicare Other | Admitting: Podiatry

## 2014-09-11 ENCOUNTER — Encounter: Payer: Self-pay | Admitting: Podiatry

## 2014-09-11 VITALS — BP 98/63 | HR 68 | Resp 17

## 2014-09-11 DIAGNOSIS — L97529 Non-pressure chronic ulcer of other part of left foot with unspecified severity: Secondary | ICD-10-CM

## 2014-09-11 DIAGNOSIS — L97501 Non-pressure chronic ulcer of other part of unspecified foot limited to breakdown of skin: Secondary | ICD-10-CM | POA: Diagnosis not present

## 2014-09-11 NOTE — Progress Notes (Signed)
Patient ID: Stephen Medina, male   DOB: 12/05/1950, 64 y.o.   MRN: [id number]  Subjective: Stephen Medina presents the office today for follow-up evaluation of ulceration on the left medial arch of his foot. He states that he continues to change the dressing daily with silvadene. Small amount of bloody drainage at times but denies any pus. Denies any surrounding redness or any drainage or red streaks. He denies any systemic complaints as fevers, chills, nausea, vomiting. Denies any calf pain, chest pain, shards of breath. No other complaints at this time and no acute changes since last appointment.   Objective: AAO x3, NAD DP/PT pulses palpable, CRT less than 3 seconds Protective sensation decreased with Simms Weinstein monofilament on the plantar aspect the left medial arch of the foot there is an annular ulceration with hyperkeratotic periwound. After debridement the wound measures approximately 1 x 1 cm. There is no probe to bone, undermining, tunneling of either ulceration. There is no surrounding erythema, ascending cellulitis, fluctuance, crepitus, malodor, drainage/purulence. There is a pre-ulcerative lesion medial to this on the medial portion of the foot measuring with hyperkeratotic tissue overlying the area. Upon debridement of the medial lesion of is no underlying ulceration at this time. No other open lesions or pre-ulcerative lesions identified. No other areas of tenderness bilateral lower extremity. There is no overlying edema, erythema, increased warmth to bilateral lower x-rays. No pain with calf compression, swelling, warmth, erythema.  Assessment: 64 year old male left chronic plantar foot ulceration  Plan: -Treatment options discussed include alternatives, risks, competitions. -The wound was debrided without complications to healthy, bleeding, granular wound base. Silvadene was applied followed by dry sterile dressing.Continue with silvadene dressing changes daily. -Continue with  offloading/surgical shoe.  -Monitor for any clinical signs or symptoms of infection and directed to call the office immediately should any occur or go to the ER. -Follow-up 2 weeks or sooner if any problems arise. In the meantime, encouraged to call the office with any questions, concerns, change in symptoms.   Celesta Gentile, DPM

## 2014-09-14 ENCOUNTER — Telehealth: Payer: Self-pay | Admitting: *Deleted

## 2014-09-19 NOTE — Telephone Encounter (Signed)
Entered in error

## 2014-09-25 ENCOUNTER — Encounter: Payer: Self-pay | Admitting: Podiatry

## 2014-09-25 ENCOUNTER — Ambulatory Visit (INDEPENDENT_AMBULATORY_CARE_PROVIDER_SITE_OTHER): Payer: Medicare Other | Admitting: Podiatry

## 2014-09-25 VITALS — BP 115/67 | HR 71 | Resp 18

## 2014-09-25 DIAGNOSIS — L97529 Non-pressure chronic ulcer of other part of left foot with unspecified severity: Secondary | ICD-10-CM

## 2014-09-25 DIAGNOSIS — L89891 Pressure ulcer of other site, stage 1: Secondary | ICD-10-CM | POA: Diagnosis not present

## 2014-09-25 MED ORDER — HYDROCODONE-ACETAMINOPHEN 10-325 MG PO TABS: 1.0000 | ORAL_TABLET | Freq: Three times a day (TID) | ORAL | Status: DC | PRN

## 2014-09-26 ENCOUNTER — Encounter: Payer: Self-pay | Admitting: Podiatry

## 2014-09-26 NOTE — Progress Notes (Signed)
Patient ID: Stephen Medina, male   DOB: [birth year], 64 y.o.   MRN: [id number]  Subjective: Mr. Rigor presents the office today for follow-up evaluation of ulceration on the left medial arch of his foot. He states that he continues to change the dressing daily with prisma. Small amount of bloody drainage at times but denies any pus. Denies any surrounding redness or any drainage or red streaks. He does state that the area is painful particularly with weightbearing and pressure. He hasn't at the callus on the side of the arch of his foot adjacent to the wound which becomes painful.  He denies any systemic complaints as fevers, chills, nausea, vomiting. Denies any calf pain, chest pain, shards of breath. No other complaints at this time and no acute changes since last appointment.   Objective: AAO x3, NAD DP/PT pulses palpable, CRT less than 3 seconds Protective sensation decreased with Simms Weinstein monofilament  On the plantar aspect the left medial arch of the foot there is an annular ulceration with hyperkeratotic periwound. After debridement the wound measures approximately 1 x 1 cm, the same as before however there is minimal depth to the wound. There is no probe to bone, undermining, tunneling of the ulceration. There is no surrounding erythema, ascending cellulitis, fluctuance, crepitus, malodor, drainage/purulence. There is a pre-ulcerative lesion medial to this on the medial portion of the foot with hyperkeratotic tissue overlying the area. Upon debridement of the medial lesion of is no underlying ulceration at this time. No other open lesions or pre-ulcerative lesions identified. No other areas of tenderness bilateral lower extremity. There is no overlying edema, erythema, increased warmth to bilateral lower x-rays. No pain with calf compression, swelling, warmth, erythema.  Assessment: 64 year old male left chronic plantar foot ulceration  Plan: -Treatment options discussed include  alternatives, risks, competitions. -The wound was debrided without complications to healthy, bleeding, granular wound base. Iodosorb was applied followed by dry sterile dressing. Continue with silvadene dressing changes daily with Prisma. -Continue with offloading/surgical shoe. Dispensed Darco shoe with a heel to help offload the area. If he has any problems with balance while wearing the shoe to hold off on wearing the shoe and continue with surgical shoe. I discussed with him nonweightbearing however he cannot do this with working. -Monitor for any clinical signs or symptoms of infection and directed to call the office immediately should any occur or go to the ER. -Follow-up 2 weeks or sooner if any problems arise. In the meantime, encouraged to call the office with any questions, concerns, change in symptoms.  *I discussed with him that if the wound continues to not improve discussed surgical intervention with debridement and graft.  Celesta Gentile, DPM

## 2014-10-02 ENCOUNTER — Other Ambulatory Visit: Payer: Self-pay | Admitting: Gastroenterology

## 2014-10-09 ENCOUNTER — Telehealth: Payer: Self-pay | Admitting: *Deleted

## 2014-10-09 ENCOUNTER — Ambulatory Visit (INDEPENDENT_AMBULATORY_CARE_PROVIDER_SITE_OTHER): Payer: Medicare Other | Admitting: Podiatry

## 2014-10-09 ENCOUNTER — Encounter: Payer: Self-pay | Admitting: Podiatry

## 2014-10-09 VITALS — BP 99/66 | HR 71 | Resp 18

## 2014-10-09 DIAGNOSIS — L97529 Non-pressure chronic ulcer of other part of left foot with unspecified severity: Secondary | ICD-10-CM | POA: Diagnosis not present

## 2014-10-09 DIAGNOSIS — Q6652 Congenital pes planus, left foot: Secondary | ICD-10-CM | POA: Diagnosis not present

## 2014-10-09 NOTE — Telephone Encounter (Signed)
Dr. Jacqualyn Posey ordered Collagen with silver for left medial arch ulcer 1.2 cm x 1.0 cm x 0.2 cm low exudate for daily dressing changes, dx M97.519.  Faxed rx and pt data and copy of insurance card.

## 2014-10-10 ENCOUNTER — Encounter: Payer: Self-pay | Admitting: Podiatry

## 2014-10-10 NOTE — Progress Notes (Signed)
Patient ID: Stephen Medina, male   DOB: 03-13-1950, 64 y.o.   MRN: [id number]  Subjective: Stephen Medina presents the office today for follow-up evaluation of ulceration on the left medial arch of his foot. He states that he continues to change the dressing daily with Silvadene. He states he continues to have pain overlying the wound. Denies any redness or red streaks. He occasionally gets some bloody drainage but denies any pus. He has been wearing her regular shoe. He denies any systemic complaints as fevers, chills, nausea, vomiting. Denies any calf pain, chest pain, shards of breath. No other complaints at this time and no acute changes since last appointment.   Objective: AAO x3, NAD DP/PT pulses palpable, CRT less than 3 seconds Protective sensation decreased with Simms Weinstein monofilament  On the plantar aspect the left medial arch of the foot there is an annular ulceration with hyperkeratotic periwound. After debridement the wound measures approximately 1 x 1 x 0.2 cm. There is no probe to bone, undermining, tunneling of the ulceration. There is no surrounding erythema, ascending cellulitis, fluctuance, crepitus, malodor, drainage/purulence. after debridement the wound is granular.  There is a pre-ulcerative lesion medial to this on the medial portion of the foot with hyperkeratotic tissue overlying the area. this area The underlying area of ulceration the plantar aspect of the foot mentioned above. Upon debridement of the medial lesion of is no underlying ulceration at this time. No other open lesions or pre-ulcerative lesions identified. No other areas of tenderness bilateral lower extremities There is no overlying edema, erythema, increased warmth to bilateral lower extremities.  No pain with calf compression, swelling, warmth, erythema.  Assessment: 64 year old male left chronic plantar foot ulceration  Plan: -Treatment options discussed include alternatives, riskscomplications. Dr. Paulla Dolly  ascertained evaluated the patient during today's appointment.  -The wound was debrided without complications to healthy, bleeding, granular wound base. Silvadene was applied. -Ordered collagen with a silver dressing to apply daily. -Offloading pads were dispensed. He has a surgical shoe at home and showed him how to apply the patch of the shoe to help offload the wound. -Monitor for any clinical signs or symptoms of infection and directed to call the office immediately should any occur or go to the ER. -Recommended nonweightbearing but he states he is unable to do this. -Follow-up 2 weeks or sooner if any problems arise. In the meantime, encouraged to call the office with any questions, concerns, change in symptoms.   Celesta Gentile, DPM

## 2014-10-23 ENCOUNTER — Encounter: Payer: Self-pay | Admitting: Podiatry

## 2014-10-23 ENCOUNTER — Ambulatory Visit (INDEPENDENT_AMBULATORY_CARE_PROVIDER_SITE_OTHER): Payer: Medicare Other | Admitting: Podiatry

## 2014-10-23 VITALS — BP 95/55 | HR 79 | Resp 18

## 2014-10-23 DIAGNOSIS — L89891 Pressure ulcer of other site, stage 1: Secondary | ICD-10-CM

## 2014-10-23 DIAGNOSIS — L97521 Non-pressure chronic ulcer of other part of left foot limited to breakdown of skin: Secondary | ICD-10-CM

## 2014-10-28 NOTE — Progress Notes (Signed)
Patient ID: Stephen Medina, male   DOB: 03-May-1950, 64 y.o.   MRN: [id number]  Subjective: Stephen Medina presents the office today for follow-up evaluation of ulceration on the left medial arch of his foot. Since last appointment he is started to use the puracol and he states that the foot is finally starting to make some improvement in the wound. He denies any drainage or purulence. He's also been using offloading pad which were made him last appointment. Denies any surrounding redness or drainage. No other complaints at this time. He denies any systemic complaints as fevers, chills, nausea, vomiting. No calf pain, chest been concerns of breath.  Objective: AAO x3, NAD DP/PT pulses palpable, CRT less than 3 seconds Protective sensation decreased with Simms Weinstein monofilament  On the plantar aspect the left medial arch of the foot there is an annular ulceration with hyperkeratotic periwound. After debridement the wound measures approximately 0.9 x 0.7 cm. There is no probe to bone, undermining, tunneling of the ulceration. There is no surrounding erythema, ascending cellulitis, fluctuance, crepitus, malodor, drainage/purulence. after debridement the wound is granular.  No other open lesions or pre-ulcerative lesions identified. No other areas of tenderness bilateral lower extremities There is no overlying edema, erythema, increased warmth to bilateral lower extremities.  No pain with calf compression, swelling, warmth, erythema.  Assessment: 64 year old male left chronic plantar foot ulceration with signs of healing.   Plan: -Treatment options discussed include alternatives, riskscomplications. Dr. Paulla Dolly ascertained evaluated the patient during today's appointment.  -The wound was debrided without complications to healthy, bleeding, granular wound base. Silvadene was applied. -Continue with daily dressing changes. -Continue with offloading pads. -Monitor for any clinical signs or symptoms of  infection and directed to call the office immediately should any occur or go to the ER. -Recommended nonweightbearing but he states he is unable to do this. -Follow-up 2 weeks or sooner if any problems arise. In the meantime, encouraged to call the office with any questions, concerns, change in symptoms.   Celesta Gentile, DPM

## 2014-11-07 ENCOUNTER — Encounter: Payer: Self-pay | Admitting: Podiatry

## 2014-11-07 ENCOUNTER — Ambulatory Visit (INDEPENDENT_AMBULATORY_CARE_PROVIDER_SITE_OTHER): Payer: Medicare Other | Admitting: Podiatry

## 2014-11-07 VITALS — BP 96/69 | HR 64 | Resp 12

## 2014-11-07 DIAGNOSIS — L89891 Pressure ulcer of other site, stage 1: Secondary | ICD-10-CM

## 2014-11-07 DIAGNOSIS — L97521 Non-pressure chronic ulcer of other part of left foot limited to breakdown of skin: Secondary | ICD-10-CM

## 2014-11-08 ENCOUNTER — Encounter: Payer: Self-pay | Admitting: Podiatry

## 2014-11-08 NOTE — Progress Notes (Signed)
Patient ID: Stephen Medina, male   DOB: 12-Apr-1950, 64 y.o.   MRN: [id number]  Subjective: Stephen Medina presents the office today for continued care  of ulceration on the left medial arch of his foot. He lives at the wound is making progress  With the wound on his foot although he does continue to get I callus tenosynovitis foot. He states he gets some occasional bloody to clear drainage. Denies any pus. Denies any redness or red streaks. He gets occasional pain to the area. No other complaints at this time in no acute changes. He denies any systemic complaints such as fevers, chills, nausea, vomiting. No calf pain, chest pain, shortness of breath.  Objective: AAO x3, NAD DP/PT pulses palpable, CRT less than 3 seconds Protective sensation decreased with Simms Weinstein monofilament  On the plantar aspect the left medial arch of the foot there is an annular ulceration with hyperkeratotic periwound. After debridement the wound measures approximately 0.8 x 0.5 cm. There is no probe to bone, undermining, tunneling of the ulceration. There is no surrounding erythema, ascending cellulitis, fluctuance, crepitus, malodor, drainage/purulence. after debridement the wound is granular.  Just adjacent to the wound on the medial aspect of the foot is a hyperkeratotic lesion. Upon debridement there is a small superficial granular wound present on the medial aspect of the foot just adjacent to the plantar ulceration. The wound does not probe to bone there is no undermining or tunneling. There is no swelling erythema, ascending cellulitis,  once, crepitus, malodor. No other open lesions or pre-ulcer lesions identified bilaterally No other areas of tenderness bilateral lower extremities There is no overlying edema, erythema, increased warmth to bilateral lower extremities.  No pain with calf compression, swelling, warmth, erythema.  Assessment: 64 year old male left chronic plantar foot ulceration   Plan: -Treatment  options discussed include alternatives, riskscomplications.  -The wound was debrided without complications to healthy, bleeding, granular wound base. Silvadene was applied. -Continue with daily dressing changes with collagen/silver -Continue with offloading pads/surgical shoe.  -Monitor for any clinical signs or symptoms of infection and directed to call the office immediately should any occur or go to the ER. -Follow-up 2 weeks or sooner if any problems arise. In the meantime, encouraged to call the office with any questions, concerns, change in symptoms.   Celesta Gentile, DPM

## 2014-11-20 ENCOUNTER — Ambulatory Visit (INDEPENDENT_AMBULATORY_CARE_PROVIDER_SITE_OTHER): Payer: Medicare Other | Admitting: Podiatry

## 2014-11-20 ENCOUNTER — Encounter: Payer: Self-pay | Admitting: Podiatry

## 2014-11-20 ENCOUNTER — Ambulatory Visit (INDEPENDENT_AMBULATORY_CARE_PROVIDER_SITE_OTHER): Payer: Medicare Other

## 2014-11-20 VITALS — BP 100/47 | HR 66 | Resp 18

## 2014-11-20 DIAGNOSIS — R52 Pain, unspecified: Secondary | ICD-10-CM

## 2014-11-20 DIAGNOSIS — L89891 Pressure ulcer of other site, stage 1: Secondary | ICD-10-CM

## 2014-11-20 DIAGNOSIS — L97521 Non-pressure chronic ulcer of other part of left foot limited to breakdown of skin: Secondary | ICD-10-CM

## 2014-11-20 NOTE — Progress Notes (Signed)
Patient ID: JOSELITO FIELDHOUSE, male   DOB: 12-Mar-1950, 64 y.o.   MRN: [id number]  Subjective: Mr. Burgert presents the office today for continued care  of ulceration on the left medial arch of his foot. He states that he continues have pain to the wound. He's been using just the collagen and silver dressings daily. He gets some bloody drainage but denies any pus. Denies any redness or red streaks. She does continue the surgical shoe. No other complaints at this time in no acute changes. He denies any systemic complaints such as fevers, chills, nausea, vomiting. No calf pain, chest pain, shortness of breath.  Objective: AAO x3, NAD DP/PT pulses palpable, CRT less than 3 seconds Protective sensation decreased with Simms Weinstein monofilament  On the plantar aspect the left medial arch of the foot there is an annular ulceration with hyperkeratotic periwound. After debridement the wound measures approximately 0.9 x 0.6 cm. There is no probe to bone, undermining, tunneling of the ulceration. There is no surrounding erythema, ascending cellulitis, fluctuance, crepitus, malodor, drainage/purulence. after debridement the wound is granular.  Just adjacent to the wound on the medial aspect of the foot is a hyperkeratotic lesion. Upon debridement there is a granular wound present on the medial aspect of the foot just adjacent to the plantar ulceration measuring 0.4 x 0.2. The wound does not probe to bone there is no undermining or tunneling. There is no surrounding erythema, ascending cellulitis,  Fluctuance , crepitus, malodor. No other open lesions or pre-ulcer lesions identified bilaterally No other areas of tenderness bilateral lower extremities There is no overlying edema, erythema, increased warmth to bilateral lower extremities.  No pain with calf compression, swelling, warmth, erythema.  Assessment: 64 year old male left chronic plantar foot ulceration   Plan: -X-rays were obtained and reviewed with the  patient.  -Treatment options discussed include alternatives, riskscomplications.  -The wound was debrided without complications to healthy, bleeding, granular wound base. Iodosorb was applied. Today with daily dressing changes. He can alternate Silvadene one day and collagen/Prisma the next. -Dispensed CAM boot today. Hope that this will help align his ankle and keep his flatfoot help him from rolling inwards putting the pressure on the inside part of his foot. -Monitor for any clinical signs or symptoms of infection and directed to call the office immediately should any occur or go to the ER. -Follow-up 2 weeks or sooner if any problems arise. In the meantime, encouraged to call the office with any questions, concerns, change in symptoms.   Celesta Gentile, DPM

## 2014-12-04 ENCOUNTER — Ambulatory Visit (INDEPENDENT_AMBULATORY_CARE_PROVIDER_SITE_OTHER): Payer: Medicare Other | Admitting: Podiatry

## 2014-12-04 ENCOUNTER — Encounter: Payer: Self-pay | Admitting: Podiatry

## 2014-12-04 VITALS — BP 104/66 | HR 77 | Resp 18

## 2014-12-04 DIAGNOSIS — L97521 Non-pressure chronic ulcer of other part of left foot limited to breakdown of skin: Secondary | ICD-10-CM

## 2014-12-04 DIAGNOSIS — L97529 Non-pressure chronic ulcer of other part of left foot with unspecified severity: Secondary | ICD-10-CM

## 2014-12-04 DIAGNOSIS — L89891 Pressure ulcer of other site, stage 1: Secondary | ICD-10-CM | POA: Diagnosis not present

## 2014-12-04 HISTORY — DX: Non-pressure chronic ulcer of other part of left foot with unspecified severity: L97.529

## 2014-12-04 MED ORDER — HYDROCODONE-ACETAMINOPHEN 10-325 MG PO TABS: 1.0000 | ORAL_TABLET | Freq: Three times a day (TID) | ORAL | Status: DC | PRN

## 2014-12-04 NOTE — Progress Notes (Signed)
Patient ID: Stephen Medina, male   DOB: 02-17-1950, 64 y.o.   MRN: [id number]  Subjective: [name] presents the office today for continued care  of ulceration on the left medial arch of his foot. He states that he continues have pain to the wound. He states he had 2 good days without pain after the last appointment but after a period of time the pain returns.  He's been using just the collagen and silver dressings daily. He continues to gets some occasional bloody drainage but denies any pus. Denies any redness or red streaks. She does continue the surgical shoe. No other complaints at this time in no acute changes. He denies any systemic complaints such as fevers, chills, nausea, vomiting. No calf pain, chest pain, shortness of breath.  Objective: AAO x3, NAD DP/PT pulses palpable, CRT less than 3 seconds Protective sensation decreased with Simms Weinstein monofilament  On the plantar aspect the left medial arch of the foot there is an annular ulceration with hyperkeratotic periwound. After debridement the wound measures approximately 0.9 x 0.7 cm. There is no probe to bone, undermining, tunneling of the ulceration and the wound appears superficial. There is no surrounding erythema, ascending cellulitis, fluctuance, crepitus, malodor, drainage/purulence. after debridement the wound is granular.  Just adjacent to the wound on the medial aspect of the foot is a hyperkeratotic lesion. Upon debridement there is a granular wound present on the medial aspect of the foot just adjacent to the plantar ulceration measuring 0.2 x 0.2. The wound does not probe to bone there is no undermining or tunneling and appears to be improved. There is no surrounding erythema, ascending cellulitis, fluctuance , crepitus, malodor. No other open lesions or pre-ulcer lesions identified bilaterally No other areas of tenderness bilateral lower extremities There is no overlying edema, erythema, increased warmth to bilateral lower  extremities.  No pain with calf compression, swelling, warmth, erythema.  Assessment: 64 year old male left chronic plantar foot ulceration   Plan: -Treatment options discussed include alternatives, riskscomplications.  -The wound was debrided without complications to healthy, bleeding, granular wound base. Medihoney was applied today with daily dressing changes. Rx this today -Continue offloading shoes. He has moved his office to his house to help decrease the amount of walking. -Refilled Vicodin -Monitor for any clinical signs or symptoms of infection and directed to call the office immediately should any occur or go to the ER. -Follow-up 2 weeks or sooner if any problems arise. In the meantime, encouraged to call the office with any questions, concerns, change in symptoms.   Celesta Gentile, DPM

## 2014-12-05 ENCOUNTER — Telehealth: Payer: Self-pay | Admitting: *Deleted

## 2014-12-05 NOTE — Telephone Encounter (Addendum)
[facility] - Dr. 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  I explained to pt Dr. Jacqualyn Posey had ordered [facility] from [facility] and they would call to inform him of delivery.  Pt states he doesn't have anything to cover the ulcer with at this time until the [facility] comes in.  I reordered the Silvadene Cream to be used until the [facility] arrived at that time he was to begin the new medication.  Pt states understanding.

## 2014-12-06 MED ORDER — SILVER SULFADIAZINE 1 % EX CREA: 2.0000 "application " | TOPICAL_CREAM | Freq: Every day | CUTANEOUS | Status: DC

## 2014-12-18 ENCOUNTER — Ambulatory Visit (INDEPENDENT_AMBULATORY_CARE_PROVIDER_SITE_OTHER): Payer: Medicare Other | Admitting: Podiatry

## 2014-12-18 ENCOUNTER — Encounter: Payer: Self-pay | Admitting: Podiatry

## 2014-12-18 VITALS — BP 124/69 | HR 60 | Temp 96.5°F | Resp 18

## 2014-12-18 DIAGNOSIS — L97521 Non-pressure chronic ulcer of other part of left foot limited to breakdown of skin: Secondary | ICD-10-CM

## 2014-12-18 MED ORDER — AMOXICILLIN-POT CLAVULANATE 875-125 MG PO TABS: 1.0000 | ORAL_TABLET | Freq: Two times a day (BID) | ORAL | Status: DC

## 2014-12-18 NOTE — Patient Instructions (Signed)

## 2014-12-19 ENCOUNTER — Encounter: Payer: Self-pay | Admitting: Podiatry

## 2014-12-19 NOTE — Progress Notes (Signed)
Patient ID: Stephen Medina, male   DOB: 07/23/1950, 64 y.o.   MRN: [id number]  Subjective: Stephen Medina presents the office today for continued care  of ulceration on the left medial arch of his foot. He states that overall he is not feeling he is improving to his wound. There when his been about the same he states the areas painful particular pressure. He denies any swelling redness or red streaks. He says the wound is not infected previously although he has previously been on antibiotics to help prevent infection he states. He denies ever seen pus coming from the wound or any red streaks. He denies any systemic complaints as fevers, chills, nausea, vomiting. Denies any calf pain, chest pain, shortness of breath.  Objective: AAO x3, NAD DP/PT pulses palpable, CRT less than 3 seconds Protective sensation decreased with Simms Weinstein monofilament  On the plantar aspect the left medial arch of the foot there is an annular ulceration with hyperkeratotic periwound. There is a wound on the medial aspect of the foot just adjacent to this ulceration as well. At today's appointment these wounds to connect. They're to annular wound with a central linear wound And the tube. The wounds measure approximate 2 x 2 centimeters however is more narrowing in the middle. The wound base is granular after debridement without any probing, undermining, tunneling. There is no swelling erythema, ascending cellulitis, fluctuance, crepitus, malodor, drainage or purulence. No other open lesions or pre-ulcerative lesions. There is no pain with calf compression, swelling, warmth, erythema.  Assessment: 64 year old male left chronic plantar foot ulceration   Plan: -Treatment options discussed include alternatives, riskscomplications.  -The wound was debrided without complications to healthy, bleeding, granular wound base.  -At this time the wound at appears not be progressing attended multiple conservative treatments. At this time I  discussed with her operative wound debridement and wound grafting. I discussed with him risks, complications and he understands this and he wishes to proceed with surgery. -I discussed with him when debridement, Integra wound grafts. -The incision placement as well as the postoperative course was discussed with the patient. I discussed risks of the surgery which include, but not limited to, infection, bleeding, pain, swelling, need for further surgery, delayed or nonhealing, painful or ugly scar, numbness or sensation changes, over/under correction, recurrence, transfer lesions, further deformity, hardware failure, DVT/PE, loss of toe/foot. Patient understands these risks and wishes to proceed with surgery. The surgical consent was reviewed with the patient all 3 pages were signed. No promises or guarantees were given to the outcome of the procedure. All questions were answered to the best of my ability. Before the surgery the patient was encouraged to call the office if there is any further questions. The surgery will be performed at [facility] on an outpatient basis. -Continue offloading shoes. He has moved his office to his house to help decrease the amount of walking. -Augmentin to help prevent infection -Monitor for any clinical signs or symptoms of infection and directed to call the office immediately should any occur or go to the ER. -Follow-up 2 weeks or sooner if any problems arise. In the meantime, encouraged to call the office with any questions, concerns, change in symptoms.   Celesta Gentile, DPM

## 2014-12-29 ENCOUNTER — Telehealth: Payer: Self-pay | Admitting: *Deleted

## 2014-12-29 NOTE — Telephone Encounter (Signed)
I left messages for the patient to call me back.  Dr. Jacqualyn Posey wants to know if he can move his surgery to 01/17/2015.

## 2014-12-29 NOTE — Telephone Encounter (Signed)
"  I'm returning your call." ?

## 2015-01-01 ENCOUNTER — Encounter: Payer: Self-pay | Admitting: Podiatry

## 2015-01-01 ENCOUNTER — Ambulatory Visit (INDEPENDENT_AMBULATORY_CARE_PROVIDER_SITE_OTHER): Payer: Medicare Other | Admitting: Podiatry

## 2015-01-01 VITALS — BP 140/79 | HR 64 | Resp 18

## 2015-01-01 DIAGNOSIS — L97521 Non-pressure chronic ulcer of other part of left foot limited to breakdown of skin: Secondary | ICD-10-CM

## 2015-01-01 DIAGNOSIS — L89891 Pressure ulcer of other site, stage 1: Secondary | ICD-10-CM | POA: Diagnosis not present

## 2015-01-01 NOTE — Telephone Encounter (Signed)
Stephen Medina came in the office today.  I asked if we could move his surgery to January 4th around noon.  He stated that is fine.  I called and spoke to [place] at [employer].  I rescheduled patient's surgery from 01/10/2015 to 01/17/2015.

## 2015-01-04 ENCOUNTER — Encounter: Payer: Self-pay | Admitting: Podiatry

## 2015-01-04 LAB — WOUND CULTURE
Gram Stain: NONE SEEN
Gram Stain: NONE SEEN
Gram Stain: NONE SEEN

## 2015-01-04 NOTE — Progress Notes (Signed)
Patient ID: Stephen Medina, male   DOB: 09-04-1950, 64 y.o.   MRN: [id number]  Subjective: Stephen Medina presents the office today for continued care  of ulceration on the left medial arch of his foot. He states that overall he is not feeling he is improving to his wound. There when his been about the same he states the areas painful particular pressure.  He states he has good days and bad days. He denies any surrounding redness or red streaks. He gets some occasional clear drainage with denies any pus. He denies any systemic complaints as fevers, chills, nausea, vomiting. Denies any calf pain, chest pain, shortness of breath.  Objective: AAO x3, NAD DP/PT pulses palpable, CRT less than 3 seconds Protective sensation decreased with Simms Weinstein monofilament  On the plantar aspect the left medial arch of the foot there is an annular ulceration with hyperkeratotic periwound.  Just adjacent to the wound is in the area of hyperkeratotic tissue on the medial aspect of the foot. After debridement today the wound does measure 1.5 x 1.3 x 0.3 cm. There is no probing to bone, undermining or tunneling. There is no swelling erythema, ascending cellulitis, fluctuance, crepitus, malodor, drainage/purulence. No other open lesions or pre-ulcerative lesions. There is a decrease in medial arch height upon weightbearing. There is no pain with calf compression, swelling, warmth, erythema.  Assessment: 64 year old male left chronic plantar foot ulceration   Plan: -Treatment options discussed include alternatives, riskscomplications.  -The wound was debrided without complications to healthy, bleeding, granular wound base.  I did obtain a wound culture today. -He would like to proceed with grafting and the wound. Surgery is been  Be scheduled forJanuary 4 given scheduling  Conflict. Continued antibiotic. -Monitor for any clinical signs or symptoms of infection and directed to call the office immediately should any occur or  go to the ER. -Follow-up 2 weeks or sooner if any problems arise. In the meantime, encouraged to call the office with any questions, concerns, change in symptoms.   Celesta Gentile, DPM

## 2015-01-10 ENCOUNTER — Encounter (HOSPITAL_BASED_OUTPATIENT_CLINIC_OR_DEPARTMENT_OTHER): Payer: Self-pay | Admitting: *Deleted

## 2015-01-12 ENCOUNTER — Encounter: Payer: Self-pay | Admitting: Podiatry

## 2015-01-12 ENCOUNTER — Telehealth: Payer: Self-pay | Admitting: *Deleted

## 2015-01-12 ENCOUNTER — Ambulatory Visit (INDEPENDENT_AMBULATORY_CARE_PROVIDER_SITE_OTHER): Payer: Medicare Other | Admitting: Podiatry

## 2015-01-12 VITALS — BP 113/63 | HR 55 | Resp 18

## 2015-01-12 DIAGNOSIS — L97501 Non-pressure chronic ulcer of other part of unspecified foot limited to breakdown of skin: Secondary | ICD-10-CM

## 2015-01-12 LAB — CBC WITH DIFFERENTIAL/PLATELET
Basophils Absolute: 0 10*3/uL (ref 0.0–0.1)
Basophils Relative: 0 % (ref 0–1)
Eosinophils Absolute: 0.3 10*3/uL (ref 0.0–0.7)
Eosinophils Relative: 4 % (ref 0–5)
HCT: 37.6 % — ABNORMAL LOW (ref 39.0–52.0)
Hemoglobin: 12.7 g/dL — ABNORMAL LOW (ref 13.0–17.0)
Lymphocytes Relative: 39 % (ref 12–46)
Lymphs Abs: 2.7 10*3/uL (ref 0.7–4.0)
MCH: 29.3 pg (ref 26.0–34.0)
MCHC: 33.8 g/dL (ref 30.0–36.0)
MCV: 86.6 fL (ref 78.0–100.0)
MPV: 9.7 fL (ref 8.6–12.4)
Monocytes Absolute: 0.6 10*3/uL (ref 0.1–1.0)
Monocytes Relative: 9 % (ref 3–12)
Neutro Abs: 3.3 10*3/uL (ref 1.7–7.7)
Neutrophils Relative %: 48 % (ref 43–77)
Platelets: 267 10*3/uL (ref 150–400)
RBC: 4.34 MIL/uL (ref 4.22–5.81)
RDW: 14.1 % (ref 11.5–15.5)
WBC: 6.8 10*3/uL (ref 4.0–10.5)

## 2015-01-12 LAB — BASIC METABOLIC PANEL
BUN: 8 mg/dL (ref 7–25)
CO2: 25 mmol/L (ref 20–31)
Calcium: 8.6 mg/dL (ref 8.6–10.3)
Chloride: 103 mmol/L (ref 98–110)
Creat: 0.83 mg/dL (ref 0.70–1.25)
Glucose, Bld: 104 mg/dL — ABNORMAL HIGH (ref 65–99)
Potassium: 4.3 mmol/L (ref 3.5–5.3)
Sodium: 135 mmol/L (ref 135–146)

## 2015-01-12 LAB — C-REACTIVE PROTEIN: CRP: <0.5 mg/dL (ref <0.60)

## 2015-01-12 MED ORDER — HYDROCODONE-ACETAMINOPHEN 10-325 MG PO TABS: 1.0000 | ORAL_TABLET | Freq: Three times a day (TID) | ORAL | Status: DC | PRN

## 2015-01-12 NOTE — Telephone Encounter (Signed)
-----   Message from Trula Slade, DPM sent at 01/11/2015  4:43 PM EST ----- I may need to cancel his case based on his wound culture and wanted to talk to him in the morning.  ----- Message -----    From: Lolita Rieger    Sent: 01/11/2015   2:19 PM      To: Trula Slade, DPM  Hello Dr. Jacqualyn Posey, Nurse called from [facility] and stated she needs your orders put in for case on 01/17/2015.  They are closed on Monday.  She said she needs it by 3 pm on tomorrow, 01/12/2015.  Thanks, [phone]

## 2015-01-12 NOTE — Telephone Encounter (Signed)
Per Dr. Jacqualyn Posey, I called and canceled surgery at [place] for 01/17/2015.  Reason for cancellation is Dr. Jacqualyn Posey said patient has bacteria in his wound and would like to hold off for now.

## 2015-01-13 LAB — SEDIMENTATION RATE: Sed Rate: 13 mm/hr (ref 0–20)

## 2015-01-13 LAB — HEMOGLOBIN A1C
Hgb A1c MFr Bld: 6.2 % — ABNORMAL HIGH (ref <5.7)
Mean Plasma Glucose: 131 mg/dL — ABNORMAL HIGH (ref <117)

## 2015-01-16 ENCOUNTER — Encounter: Payer: Self-pay | Admitting: Podiatry

## 2015-01-16 NOTE — Progress Notes (Addendum)
Patient ID: Stephen Medina, male   DOB: 05-Jun-1950, 65 y.o.   MRN: [id number]  Subjective: Stephen Medina presents the office today for continued care  of ulceration on the left medial arch of his foot. He believes that it is not been much improvement in the wound he states that he continues to be painful. He does get some clear drainage from the lamina denies any pus. Denies any surrounding redness or red streaks. He denies any systemic complaints as fevers, chills, nausea, vomiting. Denies any calf pain, chest pain, shortness of breath.  Objective: AAO x3, NAD DP/PT pulses palpable, CRT less than 3 seconds Protective sensation decreased with Simms Weinstein monofilament  On the plantar aspect the left medial arch of the foot there is an annular ulceration with hyperkeratotic periwound.  Just adjacent to the wound is in the area of hyperkeratotic tissue on the medial aspect of the foot. After debridement today the wound does measure 1.3 x 1.2 x 0.2 cm. There is no probing to bone, undermining or tunneling. There is no surrounding erythema, ascending cellulitis, fluctuance, crepitus, malodor, drainage/purulence. No other open lesions or pre-ulcerative lesions. There is a decrease in medial arch height upon weightbearing. There is no pain with calf compression, swelling, warmth, erythema.  Assessment: 65 year old male left chronic plantar foot ulceration   Plan: -Treatment options discussed include alternatives, riskscomplications.  -The wound was debrided without complications to healthy, bleeding, granular wound base.  I did obtain a wound culture today. The wound culture did not reveal any growth. However given the drainage and hasn't taken to the operative room to put a thick graft over the wound. I'm going to try to order a graft to apply in the clinic to allow for gradual healing of the wound and keep a closer check on the wound. Continue antibiotics for now. -Monitor for any clinical signs or symptoms  of infection and directed to call the office immediately should any occur or go to the ER. -Follow-up 2 weeks or sooner if any problems arise. In the meantime, encouraged to call the office with any questions, concerns, change in symptoms.   Stephen Medina, DPM   *completed paperwork for grafix

## 2015-01-17 ENCOUNTER — Telehealth: Payer: Self-pay | Admitting: *Deleted

## 2015-01-17 ENCOUNTER — Ambulatory Visit (HOSPITAL_BASED_OUTPATIENT_CLINIC_OR_DEPARTMENT_OTHER): Admission: RE | Admit: 2015-01-17 | Payer: Medicare Other | Source: Ambulatory Visit | Admitting: Podiatry

## 2015-01-17 HISTORY — DX: Gastro-esophageal reflux disease without esophagitis: K21.9

## 2015-01-17 SURGERY — DEBRIDEMENT, WOUND
Anesthesia: Monitor Anesthesia Care | Site: Foot | Laterality: Left

## 2015-01-17 NOTE — Telephone Encounter (Addendum)
Dr. Jacqualyn Posey began pre-cert process for ordering Grafix Core and Grafix Prime as in-office treatment for left medial arch ulcer.  Faxed required [employer] form, pt clinicals, demographics and insurance forms.  Copy of pt's 01/12/2015 blood work mailed to Dr. Sharilyn Sites, [facility].

## 2015-01-17 NOTE — Telephone Encounter (Signed)
-----   Message from Trula Slade, DPM sent at 01/16/2015  6:02 PM EST ----- Can you please send labs to PCP; a1c is elevated. Thanks.

## 2015-01-19 ENCOUNTER — Encounter: Payer: Self-pay | Admitting: Podiatry

## 2015-01-22 ENCOUNTER — Encounter: Payer: Self-pay | Admitting: Podiatry

## 2015-01-23 ENCOUNTER — Telehealth: Payer: Self-pay | Admitting: *Deleted

## 2015-01-23 NOTE — Telephone Encounter (Addendum)
Pt is approved for Grafix by [place] code [id number], 1st application is to be [id number] at 0915am.  I have contacted [employer] representative De Nurse, by phone and Email - tracking # [id number].

## 2015-01-29 ENCOUNTER — Other Ambulatory Visit: Payer: Self-pay | Admitting: Gastroenterology

## 2015-01-29 ENCOUNTER — Encounter: Payer: Self-pay | Admitting: Podiatry

## 2015-01-29 ENCOUNTER — Ambulatory Visit (INDEPENDENT_AMBULATORY_CARE_PROVIDER_SITE_OTHER): Payer: Medicare Other | Admitting: Podiatry

## 2015-01-29 VITALS — BP 128/85 | HR 60 | Resp 18

## 2015-01-29 DIAGNOSIS — L97501 Non-pressure chronic ulcer of other part of unspecified foot limited to breakdown of skin: Secondary | ICD-10-CM | POA: Diagnosis not present

## 2015-01-29 NOTE — Progress Notes (Addendum)
Patient ID: Stephen Medina, male   DOB: Jul 17, 1950, 64 y.o.   MRN: [id number]  Subjective: Stephen Medina presents the office today for continued care of ulceration on the left medial arch of his foot. The wound has not made any progression and continues to be about the same size. It also continues to be painful with pressure. He had some clear drainage, although slight and denies any pus. Denies any surrounding redness or red streaks. He denies any systemic complaints as fevers, chills, nausea, vomiting. Denies any calf pain, chest pain, shortness of breath.  Date of onset of the wound: First seen 05/10/14 for the ulcer Overall health: obesity, neuroapthy, CAD, HTN, High Cholesterol Kidney Function: BUN/Cr from Dec 2016 8/0.83 Negative tobacco use Previous treatment: He is attended multiple conservative chance including various wound care Price, offloading, padding, shoe gear changes without any relief of symptoms.  Objective: AAO x3, NAD DP/PT pulses palpable, CRT less than 3 seconds Protective sensation decreased with Simms Weinstein monofilament  On the plantar aspect the left medial arch of the foot there is an annular ulceration with hyperkeratotic periwound.  Just adjacent to the wound is in the area of hyperkeratotic tissue on the medial aspect of the foot. After debridement today the wound does measure 1.5 x 1.2 x 0.2 cm. There is no probing to bone, undermining or tunneling. There is no surrounding erythema, ascending cellulitis, fluctuance, crepitus, malodor, drainage/purulence. No other open lesions or pre-ulcerative lesions. There is a decrease in medial arch height upon weightbearing. There is no pain with calf compression, swelling, warmth, erythema.  Assessment: 65 year old male left chronic plantar foot ulceration with no evidence of significant healing.  Diagnosis: L 97.521  Plan: -Treatment options discussed include alternatives, riskscomplications.  -Finish course of antibiotics   -The wound was debrided without complications to healthy, bleeding, granular wound base. The wound base was cleaned and hemostasis was obtained. Grafix prime (#4131) was applied following manufactures guidelines without complications. Adaptic was applied followed by steri-strips and a DSD. Keep dressing clean, dry, intact. I used the entire 3 x 4 piece, no waste.  -Offloading pads/surgical shoe. Use cane to help take pressure of wound -Monitor for any clinical signs or symptoms of infection and directed to call the office immediately should any occur or go to the ER. -Follow-up in 1 week for repeat graft application or sooner if any problems arise. In the meantime, encouraged to call the office with any questions, concerns, change in symptoms.   Celesta Gentile, DPM

## 2015-02-05 ENCOUNTER — Ambulatory Visit (INDEPENDENT_AMBULATORY_CARE_PROVIDER_SITE_OTHER): Payer: Medicare Other | Admitting: Podiatry

## 2015-02-05 DIAGNOSIS — L97521 Non-pressure chronic ulcer of other part of left foot limited to breakdown of skin: Secondary | ICD-10-CM

## 2015-02-05 DIAGNOSIS — L97501 Non-pressure chronic ulcer of other part of unspecified foot limited to breakdown of skin: Secondary | ICD-10-CM

## 2015-02-05 NOTE — Progress Notes (Addendum)
Patient ID: Stephen Medina, male   DOB: [birth year], 65 y.o.   MRN: [id number]  Subjective: Mr. Bigner presents the office today for continued care of ulceration on the left medial arch of his foot. He presents today for Grafix application #2 of 10. He density he had some drainage on the bandage for what he has changed twice of the last week. He states he has having no pain up until yesterday to the site. He denies any systemic complaints as fevers, chills, nausea, vomiting. Denies any calf pain, chest pain, shortness of breath.  Date of onset of the wound: First seen 05/10/14 for the ulcer Overall health: obesity, neuroapthy, CAD, HTN, High Cholesterol Kidney Function: BUN/Cr from Dec 2016 8/0.83 Negative tobacco use Previous treatment: He is attended multiple conservative chance including various wound care Price, offloading, padding, shoe gear changes without any relief of symptoms.  Objective: AAO x3, NAD DP/PT pulses palpable, CRT less than 3 seconds Protective sensation decreased with Simms Weinstein monofilament  On the plantar aspect the left medial arch of the foot there is an annular ulceration with hyperkeratotic periwound.  After debridement today the wound does measure 1.2 x 0.9 x 0.2 cm. There is no probing to bone, undermining or tunneling. No fluctuance or crepitus. No swelling erythema, ascending cellulitis. No clinical signs of infection.  Just adjacent to the wound over the area of previous hyperkeratotic tissue there is no significant build up at this time.  No other open lesions or pre-ulcerative lesions. There is a decrease in medial arch height upon weightbearing. There is no pain with calf compression, swelling, warmth, erythema.  Assessment: 65 year old male left chronic plantar foot ulceration which has somewhat improved  Plan: -Treatment options discussed include alternatives, risks complications.  -The wound was debrided without complications to healthy, bleeding,  granular wound base. The wound base was cleaned and hemostasis was obtained. Grafix prime (#4131) was applied following manufactures guidelines without complications. Adaptic was applied followed by steri-strips and a DSD. Keep dressing clean, dry, intact. Lot number [id number] Unit # [id number] Expir: [id number] Part No: [id number]. I used the entire 3 x 4 piece, no waste.  -Offloading pads/surgical shoe. -Monitor for any clinical signs or symptoms of infection and directed to call the office immediately should any occur or go to the ER. -Follow-up in 1 week for repeat graft application or sooner if any problems arise. In the meantime, encouraged to call the office with any questions, concerns, change in symptoms.   Celesta Gentile, DPM

## 2015-02-06 ENCOUNTER — Encounter: Payer: Self-pay | Admitting: Podiatry

## 2015-02-12 ENCOUNTER — Ambulatory Visit (INDEPENDENT_AMBULATORY_CARE_PROVIDER_SITE_OTHER): Payer: Medicare Other | Admitting: Podiatry

## 2015-02-12 ENCOUNTER — Encounter: Payer: Self-pay | Admitting: Podiatry

## 2015-02-12 VITALS — BP 97/57 | HR 62 | Resp 17

## 2015-02-12 DIAGNOSIS — L89891 Pressure ulcer of other site, stage 1: Secondary | ICD-10-CM

## 2015-02-12 DIAGNOSIS — L97501 Non-pressure chronic ulcer of other part of unspecified foot limited to breakdown of skin: Secondary | ICD-10-CM

## 2015-02-12 NOTE — Progress Notes (Addendum)
Patient ID: Stephen Medina, male   DOB: 25-Jan-1950, 65 y.o.   MRN: [id number]  Subjective: Stephen Medina presents the office today for continued care of ulceration on the left medial arch of his foot. He presents today for graft #3. He states the bandage every other day to some drainage on the bandage but denies any pus. He has not had any fevers or chills or any nausea or vomiting. No calf pain, chest pain, shortness of breath. The pain to the area had significant improvement compared to what it was previously.   Objective: AAO x3, NAD DP/PT pulses palpable, CRT less than 3 seconds Protective sensation decreased with Simms Weinstein monofilament  On the plantar aspect the left medial arch of the foot there is an annular ulceration with hyperkeratotic periwound.  After debridement today the wound does measure 1.1 x 1 x 0.2 cm.  The wound does appear to be filling in compared to last appointment and before starting the graft applications. There is small amount of hyperkeratotic tissue around the area. Upon debridement there is no purulence expressed. There is no clinical signs of infection. No malodor.No other open lesions or pre-ulcerative lesions. There is a decrease in medial arch height upon weightbearing. There is no pain with calf compression, swelling, warmth, erythema.  Assessment: 65 year old male left chronic plantar foot ulceration which has somewhat improved  Plan: -Treatment options discussed include alternatives, risks complications.  -The wound was debrided without complications to healthy, bleeding, granular wound base. The wound base was cleaned and hemostasis was obtained. The graft was not applied today as there was shipping error. Height is or was applied. Continue daily dressing changes. -Offloading pads/surgical shoe. -Monitor for any clinical signs or symptoms of infection and directed to call the office immediately should any occur or go to the ER. -Follow-up in 1 week for  hopeful graft application or sooner if any problems arise. In the meantime, encouraged to call the office with any questions, concerns, change in symptoms.   Celesta Gentile, DPM

## 2015-02-19 ENCOUNTER — Encounter: Payer: Self-pay | Admitting: Podiatry

## 2015-02-19 ENCOUNTER — Ambulatory Visit (INDEPENDENT_AMBULATORY_CARE_PROVIDER_SITE_OTHER): Payer: Medicare Other | Admitting: Podiatry

## 2015-02-19 VITALS — BP 100/63 | HR 80 | Resp 16

## 2015-02-19 DIAGNOSIS — L97501 Non-pressure chronic ulcer of other part of unspecified foot limited to breakdown of skin: Secondary | ICD-10-CM

## 2015-02-19 DIAGNOSIS — L97521 Non-pressure chronic ulcer of other part of left foot limited to breakdown of skin: Secondary | ICD-10-CM | POA: Diagnosis not present

## 2015-02-19 NOTE — Progress Notes (Addendum)
Patient ID: Stephen Medina, male   DOB: 1950-11-23, 65 y.o.   MRN: [id number]  Subjective: Stephen Medina presents the office today for continued care of ulceration on the left medial arch of his foot. Since last appointment he has been changing the bandage. It has been draining some clear drainage, but no pus. No surrounding redness or red streaking.  He has not had any fevers or chills or any nausea or vomiting. No calf pain, chest pain, shortness of breath. The pain to the area had significant improvement compared to what it was previously. Presents today for graft application #3 of the approved 10 (if needed)  Date of onset of the wound: First seen 05/10/14 for the ulcer Overall health: obesity, neuroapthy, CAD, HTN, High Cholesterol Kidney Function: BUN/Cr from Dec 2016 8/0.83 Negative tobacco use Previous treatment: He is attended multiple conservative chance including various wound care Price, offloading, padding, shoe gear changes without any relief of symptoms.  Objective: AAO x3, NAD DP/PT pulses palpable, CRT less than 3 seconds Protective sensation decreased with Simms Weinstein monofilament  On the plantar aspect the left medial arch of the foot there is an annular ulceration with hyperkeratotic periwound.  After debridement today the wound does measuring 1.1 x 0.8 x 0.2. There is no surrounding erythema, ascending S[place], fluctuance, crepitus, malodor, drainage or postulate. There is no pain with calf compression, swelling, warmth, erythema.  Assessment: 65 year old male left chronic plantar foot ulceration   Plan: -Treatment options discussed include alternatives, risks complications.  -The wound was debrided without complications to healthy, bleeding, granular wound base. The wound base was cleaned and hemostasis was obtained. The third application of grafix prime was applied today with Lot number [id number], Unit number [id number] Expiration Sept 13, 2018, Part number [id number]. Used the entire 3  x 4 cm graft, no waste. Adaptic was applied over the graft and secured with steri-strips followed by a DSD.  -Offloading pads/surgical shoe. -Monitor for any clinical signs or symptoms of infection and directed to call the office immediately should any occur or go to the ER. -Follow-up in 1 week for hopeful graft application or sooner if any problems arise. In the meantime, encouraged to call the office with any questions, concerns, change in symptoms.   Celesta Gentile, DPM

## 2015-02-26 ENCOUNTER — Ambulatory Visit (INDEPENDENT_AMBULATORY_CARE_PROVIDER_SITE_OTHER): Payer: Medicare Other | Admitting: Podiatry

## 2015-02-26 ENCOUNTER — Encounter: Payer: Self-pay | Admitting: Podiatry

## 2015-02-26 VITALS — BP 114/65 | HR 61 | Resp 16

## 2015-02-26 DIAGNOSIS — L97521 Non-pressure chronic ulcer of other part of left foot limited to breakdown of skin: Secondary | ICD-10-CM | POA: Diagnosis not present

## 2015-02-26 NOTE — Progress Notes (Addendum)
Patient ID: JEUDY TUONG, male   DOB: Jul 08, 1950, 65 y.o.   MRN: [id number]  Subjective: Mr. Pennella presents the office today for continued care of ulceration on the left medial arch of his foot. Presents today for graft 4/10 He states that he has continued to have some clear drainage and he does change the dressing.  Denies any pus. He has been continuing with the surgical shoe and offloading pads. He has not had any fevers or chills or any nausea or vomiting. No calf pain, chest pain, shortness of breath. No other complaints today.   Date of onset of the wound: First seen 05/10/14 for the ulcer Overall health: obesity, neuroapthy, CAD, HTN, High Cholesterol Kidney Function: BUN/Cr from Dec 2016 8/0.83 Negative tobacco use Previous treatment: He is attended multiple conservative chance including various wound care Price, offloading, padding, shoe gear changes without any relief of symptoms.  Objective: AAO x3, NAD DP/PT pulses palpable, CRT less than 3 seconds Protective sensation decreased with Simms Weinstein monofilament  On the plantar aspect the left medial arch of the foot there is an annular ulceration with hyperkeratotic periwound.  After debridement today the wound does measuring 1.1 x 0.8 x 0.2.  He does appear to be somewhat improved in the depth compared to last appointment. There is no surrounding erythema, ascending cellulitis, fluctuance, crepitus, malodor, drainage or postulate. There is no pain with calf compression, swelling, warmth, erythema.  Assessment: 65 year old male left chronic plantar foot ulceration   Plan: -Treatment options discussed include alternatives, risks complications.  -The wound was debrided without complications to healthy, bleeding, granular wound base. The wound base was cleaned and hemostasis was obtained. The fourth application of grafix prime was applied today with Lot number [id number], Unit number [id number] Expiration November 07, 2016, Part number [id number].  Used the entire 3 x 4 cm graft, no waste. Adaptic was applied followed by steri-strips to secure the graft and a DSD -Offloading pads/surgical shoe. -Monitor for any clinical signs or symptoms of infection and directed to call the office immediately should any occur or go to the ER. -Follow-up in 1 week for hopeful graft application or sooner if any problems arise. In the meantime, encouraged to call the office with any questions, concerns, change in symptoms.   Celesta Gentile, DPM

## 2015-02-28 ENCOUNTER — Encounter: Payer: Self-pay | Admitting: Podiatry

## 2015-03-05 ENCOUNTER — Ambulatory Visit (INDEPENDENT_AMBULATORY_CARE_PROVIDER_SITE_OTHER): Payer: Medicare Other | Admitting: Podiatry

## 2015-03-05 ENCOUNTER — Encounter: Payer: Self-pay | Admitting: Podiatry

## 2015-03-05 ENCOUNTER — Other Ambulatory Visit: Payer: Medicare Other

## 2015-03-05 VITALS — BP 132/84 | HR 75 | Resp 18

## 2015-03-05 DIAGNOSIS — L97501 Non-pressure chronic ulcer of other part of unspecified foot limited to breakdown of skin: Secondary | ICD-10-CM | POA: Diagnosis not present

## 2015-03-05 DIAGNOSIS — L97521 Non-pressure chronic ulcer of other part of left foot limited to breakdown of skin: Secondary | ICD-10-CM

## 2015-03-05 NOTE — Progress Notes (Addendum)
Patient ID: Stephen Medina, male   DOB: Dec 19, 1950, 65 y.o.   MRN: [id number]  Subjective: Stephen Medina presents the office today for continued care of ulceration on the left medial arch of his foot. Presents today for graft #5 of 10. He states the drainage as decreased. No pus.  He has been continuing with the surgical shoe and offloading pads. He has not had any fevers or chills or any nausea or vomiting. No calf pain, chest pain, shortness of breath. No other complaints today.   Date of onset of the wound: First seen 05/10/14 for the ulcer Overall health: obesity, neuroapthy, CAD, HTN, High Cholesterol Kidney Function: BUN/Cr from Dec 2016 8/0.83 Negative tobacco use Previous treatment: He is attended multiple conservative chance including various wound care Price, offloading, padding, shoe gear changes without any relief of symptoms.  Objective: AAO x3, NAD DP/PT pulses palpable, CRT less than 3 seconds Protective sensation decreased with Simms Weinstein monofilament  On the plantar aspect the left medial arch of the foot there is an annular ulceration with hyperkeratotic periwound.  After debridement today the wound does measuring 1.1 x 0.7 x 0.2.  He does appear to be somewhat improved in the depth compared to last appointment. The wound continues to be granular. There is no surrounding erythema, ascending cellulitis, fluctuance, crepitus, malodor, drainage or pus. Adjacent to the wound along the medial aspect of the foot is a pre-ulcerative lesion with superficial lesion upon debridement. No drainage or pus. This is over the same areas which was present before.  There is no pain with calf compression, swelling, warmth, erythema.  Assessment: 65 year old male left chronic plantar foot ulceration   Plan: -Treatment options discussed include alternatives, risks complications.  -The wound was debrided without complications to healthy, bleeding, granular wound base. The wound base was cleaned and  hemostasis was obtained. The fifth application of grafix prime was applied today with Lot number [id number], Unit number [id number] Expiration October 29, 2016, Part number [id number]. Used the entire 3 x 4 cm graft, no waste. Adaptic applied followed by steri-strips and DSD.  Offloading pads. Recommend him to use his crutches to help take some of the pressure off the area. At least WB to heel.  -Offloading pads/surgical shoe. -Monitor for any clinical signs or symptoms of infection and directed to call the office immediately should any occur or go to the ER. -Follow-up in 1 week for hopeful graft application or sooner if any problems arise. In the meantime, encouraged to call the office with any questions, concerns, change in symptoms.   Celesta Gentile, DPM

## 2015-03-09 ENCOUNTER — Other Ambulatory Visit: Payer: Self-pay | Admitting: Cardiology

## 2015-03-09 DIAGNOSIS — R911 Solitary pulmonary nodule: Secondary | ICD-10-CM

## 2015-03-09 DIAGNOSIS — I712 Thoracic aortic aneurysm, without rupture, unspecified: Secondary | ICD-10-CM

## 2015-03-12 ENCOUNTER — Ambulatory Visit (INDEPENDENT_AMBULATORY_CARE_PROVIDER_SITE_OTHER): Payer: Medicare Other | Admitting: Podiatry

## 2015-03-12 ENCOUNTER — Encounter: Payer: Self-pay | Admitting: Podiatry

## 2015-03-12 ENCOUNTER — Ambulatory Visit
Admission: RE | Admit: 2015-03-12 | Discharge: 2015-03-12 | Disposition: A | Discharge status: Home / Self Care | Payer: Medicare Other | Source: Ambulatory Visit | Attending: Cardiology | Admitting: Cardiology

## 2015-03-12 VITALS — BP 118/65 | HR 54 | Temp 97.2°F | Resp 18

## 2015-03-12 DIAGNOSIS — L97501 Non-pressure chronic ulcer of other part of unspecified foot limited to breakdown of skin: Secondary | ICD-10-CM

## 2015-03-12 DIAGNOSIS — I712 Thoracic aortic aneurysm, without rupture, unspecified: Secondary | ICD-10-CM

## 2015-03-12 DIAGNOSIS — R911 Solitary pulmonary nodule: Secondary | ICD-10-CM

## 2015-03-12 DIAGNOSIS — L97521 Non-pressure chronic ulcer of other part of left foot limited to breakdown of skin: Secondary | ICD-10-CM

## 2015-03-12 MED ORDER — AMOXICILLIN-POT CLAVULANATE 875-125 MG PO TABS: 1.0000 | ORAL_TABLET | Freq: Two times a day (BID) | ORAL | Status: DC

## 2015-03-12 NOTE — Progress Notes (Addendum)
Patient ID: Stephen Medina, male   DOB: May 04, 1950, 65 y.o.   MRN: [id number]  Subjective: Stephen Medina presents the office today for continued care of ulceration on the left medial arch of his foot. Presents today for graft 6 of 10. He states the drainage has continued to decrease. No pus from the area.  He has been continuing with the surgical shoe and offloading pads. He has been changing the outer bandage every few days. He did notice a small amount of swelling this morning to the foot, but thinks he put the bandage on too tight. He has not had any fevers or chills or any nausea or vomiting. No calf pain, chest pain, shortness of breath. No other complaints today.   Date of onset of the wound: First seen 05/10/14 for the ulcer Overall health: obesity, neuroapthy, CAD, HTN, High Cholesterol Kidney Function: BUN/Cr from Dec 2016 8/0.83 Negative tobacco use Previous treatment: He is attended multiple conservative chance including various wound care Price, offloading, padding, shoe gear changes without any relief of symptoms.  Objective: AAO x3, NAD DP/PT pulses palpable, CRT less than 3 seconds Protective sensation decreased with Simms Weinstein monofilament  On the plantar aspect the left medial arch of the foot there is an annular ulceration with hyperkeratotic periwound.  After debridement today the wound does measuring 1.1 x 0.6 x 0.2 in the center but along the edges of the wound it appears to be more superficial. The wound does have a nice granular base after debridement. There is no ascending cellulitis, fluctuance, crepitus, malodor, drainage or pus. The keratotic periwound. No further ulceration and identified this time. There is no pain with calf compression, swelling, warmth, erythema.  Assessment: 65 year old male left chronic plantar foot ulceration   Plan: -Treatment options discussed include alternatives, risks complications.  -The wound was debrided without complications to healthy,  bleeding, granular wound base. The wound base was cleaned and hemostasis was obtained. The sixth application of grafix prime was applied today with Lot number [id number], Unit number [id number] Expiration December 14, 2016, Part number [id number]. Used the entire 3 x 4 cm graft, no waste. Used adaptic over the graft and steri-strips to secure the area followed by DSD.  Offloading pads. Recommend him to use his crutches to help take some of the pressure off the area. At least WB to heel.  -Offloading pads/surgical shoe. -Monitor for any clinical signs or symptoms of infection and directed to call the office immediately should any occur or go to the ER. -Follow-up in 1 week for hopeful graft application or sooner if any problems arise. In the meantime, encouraged to call the office with any questions, concerns, change in symptoms.   Celesta Gentile, DPM

## 2015-03-20 ENCOUNTER — Encounter: Payer: Self-pay | Admitting: Sports Medicine

## 2015-03-20 ENCOUNTER — Ambulatory Visit (INDEPENDENT_AMBULATORY_CARE_PROVIDER_SITE_OTHER): Payer: Medicare Other | Admitting: Sports Medicine

## 2015-03-20 DIAGNOSIS — M79672 Pain in left foot: Secondary | ICD-10-CM

## 2015-03-20 DIAGNOSIS — L97521 Non-pressure chronic ulcer of other part of left foot limited to breakdown of skin: Secondary | ICD-10-CM

## 2015-03-20 DIAGNOSIS — L97501 Non-pressure chronic ulcer of other part of unspecified foot limited to breakdown of skin: Secondary | ICD-10-CM | POA: Diagnosis not present

## 2015-03-20 DIAGNOSIS — M2142 Flat foot [pes planus] (acquired), left foot: Secondary | ICD-10-CM

## 2015-03-20 MED ORDER — HYDROCODONE-ACETAMINOPHEN 10-325 MG PO TABS: 1.0000 | ORAL_TABLET | Freq: Four times a day (QID) | ORAL | Status: DC | PRN

## 2015-03-20 NOTE — Progress Notes (Signed)
Patient ID: Stephen Medina, male   DOB: 10/14/1950, 65 y.o.   MRN: [id number] Subjective: Stephen Medina is a 65 y.o. male patient seen in office for evaluation of ulceration of the Left foot; Patient has been getting Grafix application; has had 6 so far. Admits to mild drainage on the outer dressings; has been changing every few days with improvement. Admits to a little bit of swelling that was present at last visit .Admits to a slight increase in foot pain.  Denies nausea/fever/vomiting/chills/night sweats/shortness of breath/calf pain. 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 Patient has no other pedal complaints at this time.  Patient Active Problem List   Diagnosis Date Noted  . Ulcer of left foot ([place]) 12/04/2014  . Essential hypertension 02/02/2014  . Angina decubitus ([place]) 02/02/2014  . Morbid obesity ([place])   . Hyperlipidemia   . History of ulcer disease 10/19/2013  . CAD (coronary artery disease) 08/01/2013  . Lumbar spondylosis 04/01/2013   Current Outpatient Prescriptions on File Prior to Visit  Medication Sig Dispense Refill  . amoxicillin-clavulanate (AUGMENTIN) 875-125 MG tablet Take 1 tablet by mouth 2 (two) times daily. 20 tablet 0  . Ascorbic Acid (VITAMIN C PO) Take 1 tablet by mouth daily.    Marland Kitchen aspirin 81 MG tablet Take 81 mg by mouth daily.    . B Complex Vitamins (VITAMIN B COMPLEX) TABS Take 1 tablet by mouth daily.    . clopidogrel (PLAVIX) 75 MG tablet Take 75 mg by mouth daily.    . isosorbide mononitrate (IMDUR) 15 mg TB24 24 hr tablet Take 0.5 tablets (15 mg total) by mouth daily. 30 tablet 2  . lisinopril-hydrochlorothiazide (PRINZIDE,ZESTORETIC) 20-12.5 MG per tablet   3  . metoprolol succinate (TOPROL-XL) 25 MG 24 hr tablet   3  . pantoprazole (PROTONIX) 40 MG tablet TAKE 1 TABLET BY MOUTH EVERY DAY 90 tablet 0  . quinapril-hydrochlorothiazide (ACCURETIC) 20-12.5 MG per tablet     . rosuvastatin (CRESTOR) 40 MG tablet Take 20 mg by  mouth daily.     . silver sulfADIAZINE (SILVADENE) 1 % cream Apply 2 application topically daily. 50 g 2   No current facility-administered medications on file prior to visit.   Allergies  Allergen Reactions  . Adhesive [Tape] Other (See Comments)    Blisters.  Paper tape ok.    Recent Results (from the past 2160 hour(s))  Wound culture     Status: None   Collection Time: 01/01/15  3:11 PM  Result Value Ref Range   Gram Stain No WBC Seen    Gram Stain No Squamous Epithelial Cells Seen    Gram Stain No Organisms Seen    Organism ID, Bacteria Multiple Organisms Present,None Predominant     Comment: No Staphylococcus aureus isolated NO GROUP A STREP (S. PYOGENES) ISOLATED   CBC with Differential     Status: Abnormal   Collection Time: 01/12/15  9:37 AM  Result Value Ref Range   WBC 6.8 4.0 - 10.5 K/uL   RBC 4.34 4.22 - 5.81 MIL/uL   Hemoglobin 12.7 (L) 13.0 - 17.0 g/dL   HCT 37.6 (L) 39.0 - 52.0 %   MCV 86.6 78.0 - 100.0 fL   MCH 29.3 26.0 - 34.0 pg   MCHC 33.8 30.0 - 36.0 g/dL   RDW 14.1 11.5 - 15.5 %   Platelets 267 150 - 400 K/uL   MPV 9.7 8.6 - 12.4 fL   Neutrophils  Relative % 48 43 - 77 %   Neutro Abs 3.3 1.7 - 7.7 K/uL   Lymphocytes Relative 39 12 - 46 %   Lymphs Abs 2.7 0.7 - 4.0 K/uL   Monocytes Relative 9 3 - 12 %   Monocytes Absolute 0.6 0.1 - 1.0 K/uL   Eosinophils Relative 4 0 - 5 %   Eosinophils Absolute 0.3 0.0 - 0.7 K/uL   Basophils Relative 0 0 - 1 %   Basophils Absolute 0.0 0.0 - 0.1 K/uL   Smear Review Criteria for review not met   Basic Metabolic Panel     Status: Abnormal   Collection Time: 01/12/15  9:37 AM  Result Value Ref Range   Sodium 135 135 - 146 mmol/L   Potassium 4.3 3.5 - 5.3 mmol/L   Chloride 103 98 - 110 mmol/L   CO2 25 20 - 31 mmol/L   Glucose, Bld 104 (H) 65 - 99 mg/dL   BUN 8 7 - 25 mg/dL   Creat 0.83 0.70 - 1.25 mg/dL   Calcium 8.6 8.6 - 10.3 mg/dL  Sedimentation rate     Status: None   Collection Time: 01/12/15  9:37 AM   Result Value Ref Range   Sed Rate 13 0 - 20 mm/hr  C-reactive protein     Status: None   Collection Time: 01/12/15  9:37 AM  Result Value Ref Range   CRP <0.5 <0.60 mg/dL  Hemoglobin A1C     Status: Abnormal   Collection Time: 01/12/15  9:37 AM  Result Value Ref Range   Hgb A1c MFr Bld 6.2 (H) <5.7 %    Comment:                                                                        According to the ADA Clinical Practice Recommendations for 2011, when HbA1c is used as a screening test:     >=6.5%   Diagnostic of Diabetes Mellitus            (if abnormal result is confirmed)   5.7-6.4%   Increased risk of developing Diabetes Mellitus   References:Diagnosis and Classification of Diabetes Mellitus,Diabetes KHTX,7741,42(LTRVU 1):S62-S69 and Standards of Medical Care in         Diabetes - 2011,Diabetes Care,2011,34 (Suppl 1):S11-S61.      Mean Plasma Glucose 131 (H) <117 mg/dL    Objective: There were no vitals filed for this visit.  General: Patient is awake, alert, oriented x 3 and in no acute distress.  Dermatology: Skin is warm and dry bilateral with a ulceration present left plantar medial midfoot. Ulceration measures 2 cm x 1 cm x 0.4 cm post debridement. There is a mildly reactive keratotic border with a healthy granular base. The ulceration does not probe to bone. There is no malodor, no active drainage, no erythema, no focal edema. No acute signs of infection.   Vascular: Dorsalis Pedis pulse = 2/4 Bilateral,  Posterior Tibial pulse = 1/4 Bilateral,  Capillary Fill Time < 3 seconds  Neurologic: Protective sensation diminished to the level of ankle using the 5.07/10g Semmes Weinstein Monofilament Left>Right.  Musculosketal: No Pain with palpation to ulcerated area however subjective increase of pain in the left foot. Pes  planus foot type Left>Right. No pain with compression to calves bilateral.   Assessment and Plan:  Problem List Items Addressed This Visit      Other    Ulcer of left foot ([place]) - Primary   Relevant Medications   HYDROcodone-acetaminophen (NORCO) 10-325 MG tablet    Other Visit Diagnoses    Pes planus of left foot        Left foot pain        Relevant Medications    HYDROcodone-acetaminophen (NORCO) 10-325 MG tablet       -Examined patient and discussed the progression of the wound and treatment alternatives. -Excisionally dedbrided Left foot plantar ulceration to healthy bleeding borders using a sterile chisel blade and tissue nippers. -Applied to left foot plantar ulceration the Seventh Grafix Prime 3x4cm lot [id number], unit # [id number], Exp 11-07-16, Part # [id number]. Total graft was used. Gaft was secured in place with Mepitel and steristrips and applied bolstered dry sterile dressing and coban. Instructed patient to continue with outer dressing changes as needed. -Gave new felt padding for surgical shoe to wear at all times to assist with offloading ulceration -Advised to limit activity to necessity -Rx Norco 10/325 to take as needed for pain - Advised patient to go to the ER or return to office if the wound worsens or if constitutional symptoms are present. -Patient to return to office in 13 days for follow up care with Dr. Jacqualyn Posey (possible repeat graft application) or sooner if problems arise.  Landis Martins, DPM

## 2015-03-26 ENCOUNTER — Ambulatory Visit: Payer: Medicare Other | Admitting: Podiatry

## 2015-04-02 ENCOUNTER — Encounter: Payer: Self-pay | Admitting: Podiatry

## 2015-04-02 ENCOUNTER — Ambulatory Visit (INDEPENDENT_AMBULATORY_CARE_PROVIDER_SITE_OTHER): Payer: Medicare Other

## 2015-04-02 ENCOUNTER — Ambulatory Visit (INDEPENDENT_AMBULATORY_CARE_PROVIDER_SITE_OTHER): Payer: Medicare Other | Admitting: Podiatry

## 2015-04-02 VITALS — BP 137/80 | HR 77 | Resp 18

## 2015-04-02 DIAGNOSIS — R52 Pain, unspecified: Secondary | ICD-10-CM

## 2015-04-02 DIAGNOSIS — L89891 Pressure ulcer of other site, stage 1: Secondary | ICD-10-CM | POA: Diagnosis not present

## 2015-04-02 DIAGNOSIS — L97521 Non-pressure chronic ulcer of other part of left foot limited to breakdown of skin: Secondary | ICD-10-CM

## 2015-04-02 MED ORDER — AMOXICILLIN-POT CLAVULANATE 875-125 MG PO TABS: 1.0000 | ORAL_TABLET | Freq: Two times a day (BID) | ORAL | Status: DC

## 2015-04-03 ENCOUNTER — Telehealth: Payer: Self-pay | Admitting: *Deleted

## 2015-04-03 ENCOUNTER — Encounter: Payer: Self-pay | Admitting: Podiatry

## 2015-04-03 DIAGNOSIS — L97521 Non-pressure chronic ulcer of other part of left foot limited to breakdown of skin: Secondary | ICD-10-CM

## 2015-04-03 NOTE — Telephone Encounter (Signed)
Dr. Jacqualyn Posey ordered Calcium Alginate with silver for left plantar forefoot medial ulcer 1.8cm x 1cm x 0.4cm with moderate exudate from [employer].  Faxed.

## 2015-04-03 NOTE — Telephone Encounter (Addendum)
Called for Grafix product return, Stephen Medina states will have return label in my email.  04/10/2015-Called for Stephen Medina-Grafix pick up product return label, Stephen Medina states she will send the return label and the package will be picked up today around noon.  04/16/2015-DrJacqualyn Posey ordered MRI left foot r/ro Osteomyelitis.  04/16/2015-[employer] [place] PRIOR AUTHORIZATION FOR MRI LEFT WITH AND WITHOUT CONTRAST [id number], VALID 04/16/2015 TO 05/15/2015.  FAXED TO [facility].

## 2015-04-03 NOTE — Progress Notes (Signed)
Patient ID: FENWICK PREVATT, male   DOB: 1950/10/03, 65 y.o.   MRN: [id number]  Subjective: Stephen Medina presents the office today for continued care of ulceration on the left medial arch of his foot. He feels the wound is about the same size. He has continued clear drainage coming from the wound. He does stress His foot as much as possible although he does do quite a bit of walking. Denies any redness or red streaks. No pus. He denies any fevers or chills or any nausea or vomiting. No calf pain, chest pain, shortness of breath. No other complaints today.   Objective: AAO x3, NAD DP/PT pulses palpable, CRT less than 3 seconds Protective sensation decreased with Simms Weinstein monofilament  On the plantar aspect the left medial arch of the foot there is an annular ulceration with hyperkeratotic periwound.  After debridement today the wound does measuring 1.6 x 0.9 x 0.3 in the center but along the edges of the wound it appears to be more superficial. The wound does have a granular wound base and there is no probing, undermining or tunneling. There is no surrounding erythema, ascending cellulitis, fluctuance, crepitus, malodor, drainage or pus. No other open lesions or pre-ulcerative lesions identified at this time. No pain with calf compression, swelling, warmth, erythema.  Assessment: 65 year old male left chronic plantar foot ulceration   Plan: -Treatment options discussed include alternatives, risks complications.  -The wound was debrided without complications to healthy, bleeding, granular wound base. At this time the wound does not progress. There for a couple treatments the wound is improving however it has started to regress despite grafting. Because of this we'll hold off on any further graft. Due to the drainage will start Augmentin in case of infection and ordered silver absorbent dressing. -If there is not much improvement discussed surgical intervention to perform an exostectomy the  area. -Monitor for any clinical signs or symptoms of infection and directed to call the office immediately should any occur or go to the ER. -Follow-up in 2 weeks  Stephen Medina, DPM

## 2015-04-16 ENCOUNTER — Ambulatory Visit (INDEPENDENT_AMBULATORY_CARE_PROVIDER_SITE_OTHER): Payer: Medicare Other | Admitting: Podiatry

## 2015-04-16 ENCOUNTER — Encounter: Payer: Self-pay | Admitting: Podiatry

## 2015-04-16 VITALS — BP 119/69 | HR 54 | Resp 18

## 2015-04-16 DIAGNOSIS — Z01812 Encounter for preprocedural laboratory examination: Secondary | ICD-10-CM | POA: Diagnosis not present

## 2015-04-16 DIAGNOSIS — L97929 Non-pressure chronic ulcer of unspecified part of left lower leg with unspecified severity: Secondary | ICD-10-CM

## 2015-04-16 DIAGNOSIS — L97529 Non-pressure chronic ulcer of other part of left foot with unspecified severity: Secondary | ICD-10-CM

## 2015-04-16 DIAGNOSIS — L97521 Non-pressure chronic ulcer of other part of left foot limited to breakdown of skin: Secondary | ICD-10-CM

## 2015-04-16 HISTORY — DX: Non-pressure chronic ulcer of unspecified part of left lower leg with unspecified severity: L97.929

## 2015-04-16 HISTORY — DX: Non-pressure chronic ulcer of other part of left foot with unspecified severity: L97.529

## 2015-04-16 NOTE — Patient Instructions (Signed)
Monitor for any signs/symptoms of infection. Call the office immediately if any occur or go directly to the emergency room. Call with any questions/concerns.  

## 2015-04-16 NOTE — Progress Notes (Signed)
Patient ID: Stephen Medina, male   DOB: Jan 24, 1950, 65 y.o.   MRN: [id number]  Subjective: Stephen Medina presents the office today for continued care of ulceration on the left medial arch of his foot. Overall the wound is about the same size is not changed. He does continue to get some clear bloody drainage from the wound. He states the wound does continue to become tender and he does take pain medicine which does help. He denies any pus. He had some small amount of redness from the area and denies any red streaks. Denies any warmth or swelling to the foot. He continues to wear surgical shoe but weightbearing without the use of assistive devices. He denies any fevers or chills or any nausea or vomiting. No calf pain, chest pain, shortness of breath. No other complaints today.   Objective: AAO x3, NAD DP/PT pulses palpable, CRT less than 3 seconds Protective sensation decreased with Simms Weinstein monofilament  On the plantar aspect the left medial arch of the foot there is an annular ulceration with hyperkeratotic periwound.  After debridement today the wound does measuring 2 x 1 x 0.2 cm. the wound appears to be increase in size however does not appear to be as deep. The wound base is granular after debridement. There is a hyperkeratotic periwound. There is a faint amount of erythema around the wound without any ascending cellulitis. This is more from inflammation as opposed infection. There is no drainage or pus. No swelling or warmth of the foot. No other open lesions or pre-ulcerative lesions. There significant flatfoot deformity present. No pain with calf compression, swelling, warmth, erythema.  Assessment: 65 year old male left chronic plantar foot ulceration, worsening  Plan: -Treatment options discussed include alternatives, risks complications.  -Wound today was debrided to healthy, granular tissue. -Due to the increase in size as well as the pain will obtain an MRI to rule out  osteomyelitis. -Pending MRI results discussed surgical intervention to good possible planing of the talus and possible grafting. -Monitor for any clinical signs or symptoms of infection and directed to call the office immediately should any occur or go to the ER. -Follow up after MRI or sooner if any issues are to arise. Encouraged to call with any questions or concerns.   Celesta Gentile, DPM

## 2015-04-17 ENCOUNTER — Ambulatory Visit
Admission: RE | Admit: 2015-04-17 | Discharge: 2015-04-17 | Disposition: A | Discharge status: Home / Self Care | Payer: Medicare Other | Source: Ambulatory Visit | Attending: Podiatry | Admitting: Podiatry

## 2015-04-17 DIAGNOSIS — L97521 Non-pressure chronic ulcer of other part of left foot limited to breakdown of skin: Secondary | ICD-10-CM

## 2015-04-17 MED ORDER — GADOBENATE DIMEGLUMINE 529 MG/ML IV SOLN
20.0000 mL | Freq: Once | INTRAVENOUS | Status: AC | PRN
  Administered 2015-04-17: 20 mL via INTRAVENOUS

## 2015-04-30 ENCOUNTER — Ambulatory Visit: Payer: Medicare Other | Admitting: Podiatry

## 2015-05-10 ENCOUNTER — Telehealth: Payer: Self-pay | Admitting: *Deleted

## 2015-05-10 NOTE — Telephone Encounter (Addendum)
De Nurse - Orisis states his office has the correct email address for sending return Grafix products [phone], and [phone], and they will be here 05/11/2015 and the [employer] will pick up on 05/11/2015.  05/24/2015-post op courtesy call-Pt states he's doing good the boot feels good and he won't be taking it off, I reminded pt RICE, not to be up on the foot more than 15 mins/hour, leave the dressing in place until seen 05/25/2015 at 1145am, keep the boot on at all times and take the pain medication as directed and call with concerns.  Pt states understanding.

## 2015-05-14 ENCOUNTER — Ambulatory Visit (INDEPENDENT_AMBULATORY_CARE_PROVIDER_SITE_OTHER): Payer: Medicare Other | Admitting: Podiatry

## 2015-05-14 ENCOUNTER — Encounter: Payer: Self-pay | Admitting: Podiatry

## 2015-05-14 VITALS — BP 128/79 | HR 62 | Resp 18

## 2015-05-14 DIAGNOSIS — L97521 Non-pressure chronic ulcer of other part of left foot limited to breakdown of skin: Secondary | ICD-10-CM | POA: Diagnosis not present

## 2015-05-14 DIAGNOSIS — M79672 Pain in left foot: Secondary | ICD-10-CM

## 2015-05-14 MED ORDER — HYDROCODONE-ACETAMINOPHEN 10-325 MG PO TABS: 1.0000 | ORAL_TABLET | Freq: Four times a day (QID) | ORAL | Status: DC | PRN

## 2015-05-14 MED ORDER — AMOXICILLIN-POT CLAVULANATE 875-125 MG PO TABS: 1.0000 | ORAL_TABLET | Freq: Two times a day (BID) | ORAL | Status: DC

## 2015-05-14 NOTE — Patient Instructions (Signed)

## 2015-05-15 ENCOUNTER — Encounter: Payer: Self-pay | Admitting: Podiatry

## 2015-05-15 NOTE — Progress Notes (Addendum)
Patient ID: Stephen Medina, male   DOB: 02/10/1950, 65 y.o.   MRN: [id number]  Subjective: Stephen Medina presents the office today for continued care of ulceration on the left medial arch of his foot. He states the wound is been about the same has been unchanged and he believes may be getting a little bit bigger. Denies any pus although he has some clear drainage at times. Denies any surrounding redness or red streaks. No recent injury or trauma. He continues to use a cane to help take pressure off the foot.  He denies any fevers or chills or any nausea or vomiting. No calf pain, chest pain, shortness of breath. No other complaints today.   Objective: AAO x3, NAD DP/PT pulses palpable, CRT less than 3 seconds Protective sensation decreased with Simms Weinstein monofilament  On the plantar aspect the left medial arch of the foot there is an annular ulceration with hyperkeratotic periwound.  A small wound is just dorsal to this on the medial aspect of the foot possibly 0.2 x 0.2 cm in To this larger wound. After debridement today the wound does measuring 2 x 1 x 0.2 cm. the wound appears to be the same in size.Marland Kitchen The wound base is granular after debridement. There is a hyperkeratotic periwound. There is no swelling erythema, ascending cellulitis.  There is no drainage or pus. No swelling or warmth of the foot. No other open lesions or pre-ulcerative lesions. There significant flatfoot deformity present. No tenderness over the 4th metatarsal.  No pain with calf compression, swelling, warmth, erythema.  Assessment: 65 year old male left chronic plantar foot ulceration,with no improvement in symptoms   Plan: -Treatment options discussed include alternatives, risks complications. Also Dr. Paulla Dolly evaluated the patient with me to help establish treatment plan. -Wound today was debrided to healthy, granular tissue. -Augmentin due to continued drainage. -At this point I discussed with him surgical intervention.  Discussed multiple options with him. I believe that he needs an excisional wound debridement, washout, open biopsy, possible exostectomy/grafting. I discussed with him that he may need more than 1 procedure for this foot to help heal this wound. He understands this. He also stands risks of potentially amputation. -The incision placement as well as the postoperative course was discussed with the patient. I discussed risks of the surgery which include, but not limited to, infection, bleeding, pain, swelling, need for further surgery, delayed or nonhealing, painful or ugly scar, numbness or sensation changes, over/under correction, recurrence, transfer lesions, further deformity, hardware failure, DVT/PE, loss of toe/foot. Patient understands these risks and wishes to proceed with surgery. The surgical consent was reviewed with the patient all 3 pages were signed. No promises or guarantees were given to the outcome of the procedure. All questions were answered to the best of my ability. Before the surgery the patient was encouraged to call the office if there is any further questions. The surgery will be performed at the [facility] on an outpatient basis. -Surgical shoe due to fracture. No injury, likely from stress fracture.   Stephen Medina, DPM   IMPRESSION: 1. The dominant plantar ulceration is below the first tarsometatarsal joint with surrounding cellulitis but no abscess or osteomyelitis. The region of inflammation tracks between the flexor hallucis longus and abductor hallucis tendons. No overtly necrotic tissue in this vicinity aside from the ulcer margin itself. 2. Fracture of the head of the fourth metatarsal with extensive associated edema, likely due to the fracture itself rather than superimposed osteomyelitis. 3. Scattered low-level reactive  edema in the proximal fifth metatarsal metaphysis, and cuboid. Degenerative low-level edema at the articulation of the lateral cuneiform and base of the  third metatarsal. 4. Postoperative findings in the first ray. 5. Pes planus.

## 2015-05-18 ENCOUNTER — Telehealth: Payer: Self-pay | Admitting: *Deleted

## 2015-05-18 NOTE — Telephone Encounter (Signed)
I returned patient's call.  He stated he had received an email and a call from the surgical center.  He said he was told to be there at 8:45am.

## 2015-05-18 NOTE — Telephone Encounter (Signed)
"  I'm supposed to have surgery Wednesday, the 10th.  I haven't heard from nobody concerning this.  I was told they would call prior to Wednesday because I don't even know what time to show up.  I hope you all enjoyed the cake.  Thanks a lot, I'll talk to you soon"

## 2015-05-23 ENCOUNTER — Encounter: Payer: Self-pay | Admitting: Podiatry

## 2015-05-23 DIAGNOSIS — S91302S Unspecified open wound, left foot, sequela: Secondary | ICD-10-CM | POA: Diagnosis not present

## 2015-05-25 ENCOUNTER — Encounter: Payer: Self-pay | Admitting: Podiatry

## 2015-05-25 ENCOUNTER — Ambulatory Visit (INDEPENDENT_AMBULATORY_CARE_PROVIDER_SITE_OTHER): Payer: Medicare Other | Admitting: Podiatry

## 2015-05-25 VITALS — BP 121/70 | HR 60 | Resp 18

## 2015-05-25 DIAGNOSIS — L97929 Non-pressure chronic ulcer of unspecified part of left lower leg with unspecified severity: Secondary | ICD-10-CM

## 2015-05-25 DIAGNOSIS — Z9889 Other specified postprocedural states: Secondary | ICD-10-CM

## 2015-05-25 NOTE — Progress Notes (Signed)
DOS 05/23/2015 Left foot wound debridment, exostectomy (removal of bony prominence), possible synthetic graft application, bone biopsy.

## 2015-05-28 ENCOUNTER — Encounter: Payer: Self-pay | Admitting: Podiatry

## 2015-05-29 ENCOUNTER — Encounter: Payer: Self-pay | Admitting: Podiatry

## 2015-05-29 NOTE — Progress Notes (Signed)
Patient ID: Stephen Medina, male   DOB: [birth year], 65 y.o.   MRN: [id number]  Subjective: Stephen Medina is a 65 y.o. is seen today in office s/p left foot wound excisional debridement and culture preformed on 2 days ago. They state their pain is controlled. He is decreased the amount of pain medicine he has been taking. He is also been trying to stay nonweightbearing status possible all does put weight on his foot with crutches. He states he is been office for the for the last 2 days. He will be showing a house today. Denies any systemic complaints such as fevers, chills, nausea, vomiting. No calf pain, chest pain, shortness of breath.   Objective: General: No acute distress, AAOx3  DP/PT pulses palpable 2/4, CRT < 3 sec to all digits.  Protective sensation intact. Motor function intact.  Left foot: Wound on the plantar medial aspect of the left midfoot on the plantar head of the talus with a granular wound base. There is no probing, undermining or tunneling. There is no drainage or pus. No swelling erythema or ascending cellulitis. No clinical signs of infection. No other areas of tenderness to bilateral lower extremities.  No other open lesions or pre-ulcerative lesions.  No pain with calf compression, swelling, warmth, erythema.   Assessment and Plan left foot wound debridement  -Treatment options discussed including all alternatives, risks, and complications -Dressing was changed. Saline wet-to-dry was applied and I discussed with him not to do this at home and showed him. Continue daily dressing changes for now. Awaiting cultures in the operating room. The cultures come back negative we'll discuss grafting. -Ice/elevation -Pain medication as needed. -Monitor for any clinical signs or symptoms of infection and DVT/PE and directed to call the office immediately should any occur or go to the ER. -Follow-up in 1 week or sooner if any problems arise. In the meantime, encouraged to call the office  with any questions, concerns, change in symptoms.   Celesta Gentile, DPM

## 2015-06-01 ENCOUNTER — Ambulatory Visit (INDEPENDENT_AMBULATORY_CARE_PROVIDER_SITE_OTHER): Payer: Medicare Other | Admitting: Podiatry

## 2015-06-01 ENCOUNTER — Encounter: Payer: Self-pay | Admitting: Podiatry

## 2015-06-01 VITALS — BP 119/64 | HR 83 | Resp 18

## 2015-06-01 DIAGNOSIS — Z9889 Other specified postprocedural states: Secondary | ICD-10-CM

## 2015-06-01 DIAGNOSIS — L97929 Non-pressure chronic ulcer of unspecified part of left lower leg with unspecified severity: Secondary | ICD-10-CM

## 2015-06-04 ENCOUNTER — Telehealth: Payer: Self-pay | Admitting: *Deleted

## 2015-06-04 NOTE — Telephone Encounter (Signed)
I'm calling to let you know that Dr. Jacqualyn Posey isn't going to be able to do your surgery on May 30.  He is trying to get it scheduled for 06/18/2015, if it's not that date it will be 06/20/2015.  He said he would discuss it with you when he comes in.  "That is fine.  I put May 30th on my papers.  Will I need to go back in and change that date?"  No, there's no need I will take care of it.  "Okay, thanks for calling.  Get out of there and go home."

## 2015-06-06 ENCOUNTER — Encounter: Payer: Self-pay | Admitting: Podiatry

## 2015-06-06 NOTE — Progress Notes (Signed)
Patient ID: Stephen Medina, male   DOB: June 14, 1950, 65 y.o.   MRN: [id number]  Subjective: Stephen Medina is a 65 y.o. is seen today in office s/p left foot wound excisional debridement and culture. She presents a pre-wound evaluation. He states that he's had minimal pain to the area. Entrance the office was much as possible but he has been walking on it and a surgical shoe. He denies any drainage or pus. No red streaks. No systemic complaints such as fevers, chills, nausea, vomiting. No calf pain, chest pain, shortness of breath.  Objective: General: No acute distress, AAOx3  DP/PT pulses palpable 2/4, CRT < 3 sec to all digits.  Protective sensation intact. Motor function intact.  Left foot: Wound on the plantar medial aspect of the left midfoot on the plantar head of the talus with a granular wound base which appears about the same size. There is no probing, undermining or tunneling. There is no drainage or pus. No surrounding erythema or ascending cellulitis. No clinical signs of infection. No other areas of tenderness to bilateral lower extremities.  No other open lesions or pre-ulcerative lesions.  No pain with calf compression, swelling, warmth, erythema.   Assessment and Plan: s/p left foot wound debridement  -Treatment options discussed including all alternatives, risks, and complications -Dressing was changed. continue saline wet-to-dry dressing changes.  -I discussed with him grafting of the wound. Awaiting wound cultures before proceeding with this.  -Monitor for any clinical signs or symptoms of infection and directed to call the office immediately should any occur or go to the ER. -Follow-up in 1 week or sooner if any problems arise. In the meantime, encouraged to call the office with any questions, concerns, change in symptoms.   Celesta Gentile, DPM   Addendum: Wound Culture 05/23/15 No Growth x 2 days

## 2015-06-08 ENCOUNTER — Encounter: Payer: Self-pay | Admitting: Podiatry

## 2015-06-08 ENCOUNTER — Ambulatory Visit (INDEPENDENT_AMBULATORY_CARE_PROVIDER_SITE_OTHER): Payer: Medicare Other | Admitting: Podiatry

## 2015-06-08 DIAGNOSIS — L97929 Non-pressure chronic ulcer of unspecified part of left lower leg with unspecified severity: Secondary | ICD-10-CM

## 2015-06-08 MED ORDER — AMOXICILLIN-POT CLAVULANATE 875-125 MG PO TABS: 1.0000 | ORAL_TABLET | Freq: Two times a day (BID) | ORAL | Status: DC

## 2015-06-08 NOTE — Patient Instructions (Signed)

## 2015-06-14 ENCOUNTER — Encounter: Payer: Self-pay | Admitting: Podiatry

## 2015-06-14 NOTE — Progress Notes (Signed)
Patient ID: DAMONTA Medina, male   DOB: [birth year], 65 y.o.   MRN: [id number]  Subjective: Stephen Medina is a 65 y.o. is seen today in office s/p left foot wound excisional debridement and culture. He states he is doing well she's having no pain. He gets occasional clear drainage from the wound denies any pus. No surrounding redness or red streaks. Tried to stay nonweightbearing such as possible and the surgical shoe. He does continue to walk on the foot however. He denies any drainage or pus. No red streaks. No systemic complaints such as fevers, chills, nausea, vomiting. No calf pain, chest pain, shortness of breath.  Objective: General: No acute distress, AAOx3  DP/PT pulses palpable 2/4, CRT < 3 sec to all digits.  Protective sensation intact. Motor function intact.  Left foot: Wound on the plantar medial aspect of the left midfoot on the plantar head of the talus with a granular wound base which appears about the same size. There is no probing, undermining or tunneling. There is no drainage or pus. No surrounding erythema or ascending cellulitis. No clinical signs of infection. No other areas of tenderness to bilateral lower extremities.  No other open lesions or pre-ulcerative lesions.  No pain with calf compression, swelling, warmth, erythema.   Assessment and Plan: s/p left foot wound debridement  -Treatment options discussed including all alternatives, risks, and complications -At this point since the cultures are negative and the wound appears to be clean I recommended return to surgery for further debridement of the wound and likely grafting. Discussed with him and Integra graft. I discussed the risks and complications which she understands and wishes received. Also discussed nonweightbearing postoperatively. -The incision placement as well as the postoperative course was discussed with the patient. I discussed risks of the surgery which include, but not limited to, infection, bleeding,  pain, swelling, need for further surgery, delayed or nonhealing, painful or ugly scar, numbness or sensation changes, over/under correction, recurrence, transfer lesions, further deformity, hardware failure, DVT/PE, loss of toe/foot/leg. Patient understands these risks and wishes to proceed with surgery. The surgical consent was reviewed with the patient all 3 pages were signed. No promises or guarantees were given to the outcome of the procedure. All questions were answered to the best of my ability. Before the surgery the patient was encouraged to call the office if there is any further questions. The surgery will be performed at the [facility] on an outpatient basis.  Celesta Gentile, DPM

## 2015-06-18 ENCOUNTER — Telehealth: Payer: Self-pay | Admitting: *Deleted

## 2015-06-18 NOTE — Telephone Encounter (Signed)
I am calling to let you know we have to change the location of your surgery.  Your implant is too expensive to be used at [facility].  We have switched it to June 22, 2015 at [facility].  The only problem is you will have to have a physical.  It has to have been done within 30 days.  Will you be okay having your surgery at [facility]?  Have you had a physical within the past 30 days?  "I'm fine with having the surgery done at [facility] as long as it's done within this year, I'm fine.  I have not had a physical within the past 30 days I can tell you that.  Most likely I will not be able to get in there before Friday.  They stay busy."  Well let them know what's going on and hopefully they can get you in before then.  "I'll try. You can fax the forms to me or you can scan it and e-mail it to me.  I'll call them and see what I can work out.  What time will I have to be there?"  I'll fax it to you.  What's your fax number?  You case starts at 7:30am.  They may want you there at 6am or 6:30am.  They will call and let you know.  "You can fax it to [phone]. "  I faxed H&P forms to patient.

## 2015-06-19 ENCOUNTER — Encounter (HOSPITAL_BASED_OUTPATIENT_CLINIC_OR_DEPARTMENT_OTHER): Payer: Self-pay | Admitting: *Deleted

## 2015-06-19 ENCOUNTER — Telehealth: Payer: Self-pay | Admitting: *Deleted

## 2015-06-19 NOTE — Telephone Encounter (Signed)
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  I'm calling from [place].  He is scheduled for surgery on the 9th.  I need orders by noon tomorrow from Dr. Jacqualyn Posey.  If there's a problem please give me a call."

## 2015-06-19 NOTE — Progress Notes (Signed)
Patient is going to his PCP tomorrow for H&P and will fax to [place]. He will come in for labs and EKG.

## 2015-06-19 NOTE — Telephone Encounter (Signed)
Done

## 2015-06-19 NOTE — Telephone Encounter (Signed)
"  Dr. Jacqualyn Posey wanted me to call and see if you were able to get an appointment for a physical.  "I was able, it's scheduled for tomorrow.  Surgical center called too and I have to stop by there tomorrow to have some blood drawn and have an EKG.  Do I have them to fax the physical info to you are does it go to the surgery center?"  It goes to both places, fax numbers are on your papers.  "Oh okay, I'll have them send it to both places or I may just drop them off over there tomorrow."  Okay, sounds good.

## 2015-06-19 NOTE — Progress Notes (Signed)
   06/19/15 1044  OBSTRUCTIVE SLEEP APNEA  Have you ever been diagnosed with sleep apnea through a sleep study? No  Do you snore loudly (loud enough to be heard through closed doors)?  0  Do you often feel tired, fatigued, or sleepy during the daytime (such as falling asleep during driving or talking to someone)? 0  Has anyone observed you stop breathing during your sleep? 0  Do you have, or are you being treated for high blood pressure? 1  BMI more than 35 kg/m2? 1  Age > 50 (1-yes) 1  Neck circumference greater than:Male 16 inches or larger, Male 17inches or larger? 1  Male Gender (Yes=1) 1  Obstructive Sleep Apnea Score 5

## 2015-06-20 ENCOUNTER — Encounter (HOSPITAL_BASED_OUTPATIENT_CLINIC_OR_DEPARTMENT_OTHER)
Admission: RE | Admit: 2015-06-20 | Discharge: 2015-06-20 | Disposition: A | Discharge status: Home / Self Care | Payer: Medicare Other | Source: Ambulatory Visit | Attending: Podiatry | Admitting: Podiatry

## 2015-06-20 DIAGNOSIS — K219 Gastro-esophageal reflux disease without esophagitis: Secondary | ICD-10-CM | POA: Diagnosis not present

## 2015-06-20 DIAGNOSIS — Z7982 Long term (current) use of aspirin: Secondary | ICD-10-CM | POA: Diagnosis not present

## 2015-06-20 DIAGNOSIS — Z7902 Long term (current) use of antithrombotics/antiplatelets: Secondary | ICD-10-CM | POA: Diagnosis not present

## 2015-06-20 DIAGNOSIS — Z79899 Other long term (current) drug therapy: Secondary | ICD-10-CM | POA: Diagnosis not present

## 2015-06-20 DIAGNOSIS — I2510 Atherosclerotic heart disease of native coronary artery without angina pectoris: Secondary | ICD-10-CM | POA: Diagnosis not present

## 2015-06-20 DIAGNOSIS — I10 Essential (primary) hypertension: Secondary | ICD-10-CM | POA: Diagnosis not present

## 2015-06-20 DIAGNOSIS — Z6841 Body Mass Index (BMI) 40.0 and over, adult: Secondary | ICD-10-CM | POA: Diagnosis not present

## 2015-06-20 DIAGNOSIS — L97529 Non-pressure chronic ulcer of other part of left foot with unspecified severity: Secondary | ICD-10-CM | POA: Diagnosis present

## 2015-06-20 DIAGNOSIS — Z955 Presence of coronary angioplasty implant and graft: Secondary | ICD-10-CM | POA: Diagnosis not present

## 2015-06-20 LAB — POCT I-STAT, CHEM 8
BUN: 10 mg/dL (ref 6–20)
Calcium, Ion: 0.88 mmol/L — ABNORMAL LOW (ref 1.13–1.30)
Chloride: 109 mmol/L (ref 101–111)
Creatinine, Ser: 0.9 mg/dL (ref 0.61–1.24)
Glucose, Bld: 100 mg/dL — ABNORMAL HIGH (ref 65–99)
HCT: 39 % (ref 39.0–52.0)
Hemoglobin: 13.3 g/dL (ref 13.0–17.0)
Potassium: 4 mmol/L (ref 3.5–5.1)
Sodium: 138 mmol/L (ref 135–145)
TCO2: 18 mmol/L (ref 0–100)

## 2015-06-22 ENCOUNTER — Ambulatory Visit (HOSPITAL_BASED_OUTPATIENT_CLINIC_OR_DEPARTMENT_OTHER): Payer: Medicare Other | Admitting: Anesthesiology

## 2015-06-22 ENCOUNTER — Encounter: Payer: Self-pay | Admitting: Podiatry

## 2015-06-22 ENCOUNTER — Ambulatory Visit (HOSPITAL_BASED_OUTPATIENT_CLINIC_OR_DEPARTMENT_OTHER)
Admission: RE | Admit: 2015-06-22 | Discharge: 2015-06-22 | Disposition: A | Discharge status: Home / Self Care | Payer: Medicare Other | Source: Ambulatory Visit | Attending: Podiatry | Admitting: Podiatry

## 2015-06-22 ENCOUNTER — Encounter (HOSPITAL_BASED_OUTPATIENT_CLINIC_OR_DEPARTMENT_OTHER)
Admission: RE | Disposition: A | Discharge status: Home / Self Care | Payer: Self-pay | Source: Ambulatory Visit | Attending: Podiatry

## 2015-06-22 ENCOUNTER — Telehealth: Payer: Self-pay | Admitting: *Deleted

## 2015-06-22 DIAGNOSIS — I10 Essential (primary) hypertension: Secondary | ICD-10-CM | POA: Insufficient documentation

## 2015-06-22 DIAGNOSIS — L97529 Non-pressure chronic ulcer of other part of left foot with unspecified severity: Secondary | ICD-10-CM | POA: Diagnosis not present

## 2015-06-22 DIAGNOSIS — Z955 Presence of coronary angioplasty implant and graft: Secondary | ICD-10-CM | POA: Insufficient documentation

## 2015-06-22 DIAGNOSIS — Z7982 Long term (current) use of aspirin: Secondary | ICD-10-CM | POA: Insufficient documentation

## 2015-06-22 DIAGNOSIS — Z7902 Long term (current) use of antithrombotics/antiplatelets: Secondary | ICD-10-CM | POA: Insufficient documentation

## 2015-06-22 DIAGNOSIS — L97521 Non-pressure chronic ulcer of other part of left foot limited to breakdown of skin: Secondary | ICD-10-CM | POA: Diagnosis not present

## 2015-06-22 DIAGNOSIS — L97929 Non-pressure chronic ulcer of unspecified part of left lower leg with unspecified severity: Secondary | ICD-10-CM

## 2015-06-22 DIAGNOSIS — Z6841 Body Mass Index (BMI) 40.0 and over, adult: Secondary | ICD-10-CM | POA: Diagnosis not present

## 2015-06-22 DIAGNOSIS — I2510 Atherosclerotic heart disease of native coronary artery without angina pectoris: Secondary | ICD-10-CM | POA: Insufficient documentation

## 2015-06-22 DIAGNOSIS — K219 Gastro-esophageal reflux disease without esophagitis: Secondary | ICD-10-CM | POA: Insufficient documentation

## 2015-06-22 DIAGNOSIS — Z79899 Other long term (current) drug therapy: Secondary | ICD-10-CM | POA: Insufficient documentation

## 2015-06-22 HISTORY — DX: Non-pressure chronic ulcer of other part of unspecified foot with unspecified severity: L97.509

## 2015-06-22 HISTORY — PX: GRAFT APPLICATION: SHX6696

## 2015-06-22 HISTORY — PX: WOUND DEBRIDEMENT: SHX247

## 2015-06-22 SURGERY — DEBRIDEMENT, WOUND
Anesthesia: General

## 2015-06-22 MED ORDER — FENTANYL CITRATE (PF) 100 MCG/2ML IJ SOLN
50.0000 ug | INTRAMUSCULAR | Status: DC | PRN
  Administered 2015-06-22 (×2): 50 ug via INTRAVENOUS

## 2015-06-22 MED ORDER — CEFAZOLIN SODIUM-DEXTROSE 2-4 GM/100ML-% IV SOLN
INTRAVENOUS | Status: AC
  Filled 2015-06-22: qty 100

## 2015-06-22 MED ORDER — MIDAZOLAM HCL 2 MG/2ML IJ SOLN
1.0000 mg | INTRAMUSCULAR | Status: DC | PRN
  Administered 2015-06-22: 2 mg via INTRAVENOUS

## 2015-06-22 MED ORDER — FENTANYL CITRATE (PF) 100 MCG/2ML IJ SOLN
INTRAMUSCULAR | Status: AC
  Filled 2015-06-22: qty 2

## 2015-06-22 MED ORDER — DEXAMETHASONE SODIUM PHOSPHATE 10 MG/ML IJ SOLN
INTRAMUSCULAR | Status: AC
  Filled 2015-06-22: qty 1

## 2015-06-22 MED ORDER — OXYCODONE HCL 5 MG PO TABS
ORAL_TABLET | ORAL | Status: AC
  Filled 2015-06-22: qty 1

## 2015-06-22 MED ORDER — METOPROLOL TARTRATE 5 MG/5ML IV SOLN
INTRAVENOUS | Status: DC | PRN
  Administered 2015-06-22: 1 mg via INTRAVENOUS

## 2015-06-22 MED ORDER — BUPIVACAINE HCL (PF) 0.5 % IJ SOLN
INTRAMUSCULAR | Status: DC | PRN
  Administered 2015-06-22: 5 mL

## 2015-06-22 MED ORDER — OXYCODONE HCL 5 MG PO TABS
5.0000 mg | ORAL_TABLET | Freq: Once | ORAL | Status: AC | PRN
  Administered 2015-06-22: 5 mg via ORAL

## 2015-06-22 MED ORDER — DEXAMETHASONE SODIUM PHOSPHATE 4 MG/ML IJ SOLN
INTRAMUSCULAR | Status: DC | PRN
  Administered 2015-06-22: 10 mg via INTRAVENOUS

## 2015-06-22 MED ORDER — MIDAZOLAM HCL 2 MG/2ML IJ SOLN
INTRAMUSCULAR | Status: AC
  Filled 2015-06-22: qty 2

## 2015-06-22 MED ORDER — LACTATED RINGERS IV SOLN
INTRAVENOUS | Status: DC
  Administered 2015-06-22: 10 mL/h via INTRAVENOUS

## 2015-06-22 MED ORDER — ONDANSETRON HCL 4 MG/2ML IJ SOLN
INTRAMUSCULAR | Status: AC
  Filled 2015-06-22: qty 2

## 2015-06-22 MED ORDER — DEXTROSE 5 % IV SOLN
3.0000 g | INTRAVENOUS | Status: AC
  Administered 2015-06-22: 3 g via INTRAVENOUS

## 2015-06-22 MED ORDER — LIDOCAINE 2% (20 MG/ML) 5 ML SYRINGE
INTRAMUSCULAR | Status: AC
  Filled 2015-06-22: qty 5

## 2015-06-22 MED ORDER — ONDANSETRON HCL 4 MG/2ML IJ SOLN
INTRAMUSCULAR | Status: DC | PRN
  Administered 2015-06-22: 4 mg via INTRAVENOUS

## 2015-06-22 MED ORDER — PROPOFOL 500 MG/50ML IV EMUL
INTRAVENOUS | Status: AC
  Filled 2015-06-22: qty 50

## 2015-06-22 MED ORDER — GLYCOPYRROLATE 0.2 MG/ML IJ SOLN: 0.2000 mg | Freq: Once | INTRAMUSCULAR | Status: DC | PRN

## 2015-06-22 MED ORDER — LIDOCAINE HCL (CARDIAC) 20 MG/ML IV SOLN
INTRAVENOUS | Status: DC | PRN
  Administered 2015-06-22: 60 mg via INTRAVENOUS

## 2015-06-22 MED ORDER — MEPERIDINE HCL 25 MG/ML IJ SOLN: 6.2500 mg | INTRAMUSCULAR | Status: DC | PRN

## 2015-06-22 MED ORDER — CEFAZOLIN SODIUM 1-5 GM-% IV SOLN
INTRAVENOUS | Status: AC
  Filled 2015-06-22: qty 50

## 2015-06-22 MED ORDER — FENTANYL CITRATE (PF) 100 MCG/2ML IJ SOLN
25.0000 ug | INTRAMUSCULAR | Status: DC | PRN
Start: 2015-06-22 — End: 2015-06-22
  Administered 2015-06-22: 25 ug via INTRAVENOUS
  Administered 2015-06-22: 50 ug via INTRAVENOUS
  Administered 2015-06-22: 25 ug via INTRAVENOUS

## 2015-06-22 MED ORDER — METOCLOPRAMIDE HCL 5 MG/ML IJ SOLN: 10.0000 mg | Freq: Once | INTRAMUSCULAR | Status: DC | PRN

## 2015-06-22 MED ORDER — LACTATED RINGERS IV SOLN: INTRAVENOUS | Status: DC

## 2015-06-22 MED ORDER — PROPOFOL 10 MG/ML IV BOLUS
INTRAVENOUS | Status: DC | PRN
  Administered 2015-06-22: 200 mg via INTRAVENOUS

## 2015-06-22 MED ORDER — OXYCODONE HCL 5 MG/5ML PO SOLN: 5.0000 mg | Freq: Once | ORAL | Status: AC | PRN

## 2015-06-22 MED ORDER — LIDOCAINE HCL 2 % IJ SOLN
INTRAMUSCULAR | Status: AC
  Filled 2015-06-22: qty 20

## 2015-06-22 MED ORDER — SCOPOLAMINE 1 MG/3DAYS TD PT72: 1.0000 | MEDICATED_PATCH | Freq: Once | TRANSDERMAL | Status: DC | PRN

## 2015-06-22 MED ORDER — BUPIVACAINE HCL (PF) 0.5 % IJ SOLN
INTRAMUSCULAR | Status: AC
  Filled 2015-06-22: qty 30

## 2015-06-22 MED ORDER — LIDOCAINE HCL 2 % IJ SOLN
INTRAMUSCULAR | Status: DC | PRN
  Administered 2015-06-22: 5 mL

## 2015-06-22 MED ORDER — METOPROLOL TARTRATE 5 MG/5ML IV SOLN
INTRAVENOUS | Status: AC
  Filled 2015-06-22: qty 5

## 2015-06-22 SURGICAL SUPPLY — 44 items
BANDAGE ACE 3X5.8 VEL STRL LF (GAUZE/BANDAGES/DRESSINGS) ×2 IMPLANT
BANDAGE ACE 4X5 VEL STRL LF (GAUZE/BANDAGES/DRESSINGS) ×2 IMPLANT
BLADE SURG 15 STRL LF DISP TIS (BLADE) ×2 IMPLANT
BLADE SURG 15 STRL SS (BLADE) ×2
BNDG CONFORM 2 STRL LF (GAUZE/BANDAGES/DRESSINGS) ×2 IMPLANT
BNDG ESMARK 4X9 LF (GAUZE/BANDAGES/DRESSINGS) ×2 IMPLANT
BNDG GAUZE ELAST 4 BULKY (GAUZE/BANDAGES/DRESSINGS) ×4 IMPLANT
BOOT STEPPER DURA XLG (SOFTGOODS) ×2 IMPLANT
COVER BACK TABLE 60X90IN (DRAPES) ×2 IMPLANT
CUFF TOURNIQUET SINGLE 18IN (TOURNIQUET CUFF) IMPLANT
DRAPE EXTREMITY T 121X128X90 (DRAPE) ×2 IMPLANT
DRAPE IMP U-DRAPE 54X76 (DRAPES) ×2 IMPLANT
DRSG EMULSION OIL 3X3 NADH (GAUZE/BANDAGES/DRESSINGS) ×2 IMPLANT
DURAPREP 26ML APPLICATOR (WOUND CARE) IMPLANT
ELECT REM PT RETURN 9FT ADLT (ELECTROSURGICAL) ×2
ELECTRODE REM PT RTRN 9FT ADLT (ELECTROSURGICAL) ×1 IMPLANT
GAUZE SPONGE 4X4 12PLY STRL (GAUZE/BANDAGES/DRESSINGS) ×2 IMPLANT
GAUZE SPONGE 4X4 16PLY XRAY LF (GAUZE/BANDAGES/DRESSINGS) IMPLANT
GLOVE BIO SURGEON STRL SZ7.5 (GLOVE) ×4 IMPLANT
GOWN STRL REUS W/ TWL LRG LVL3 (GOWN DISPOSABLE) ×1 IMPLANT
GOWN STRL REUS W/ TWL XL LVL3 (GOWN DISPOSABLE) ×1 IMPLANT
GOWN STRL REUS W/TWL LRG LVL3 (GOWN DISPOSABLE) ×1
GOWN STRL REUS W/TWL XL LVL3 (GOWN DISPOSABLE) ×1
MATRIX WOUND MESHED 2X2 (Tissue) ×1 IMPLANT
NDL SAFETY ECLIPSE 18X1.5 (NEEDLE) IMPLANT
NEEDLE HYPO 18GX1.5 SHARP (NEEDLE)
NEEDLE HYPO 25X1 1.5 SAFETY (NEEDLE) ×2 IMPLANT
NS IRRIG 1000ML POUR BTL (IV SOLUTION) IMPLANT
PACK BASIN DAY SURGERY FS (CUSTOM PROCEDURE TRAY) ×2 IMPLANT
PADDING CAST ABS 4INX4YD NS (CAST SUPPLIES) ×1
PADDING CAST ABS COTTON 4X4 ST (CAST SUPPLIES) ×1 IMPLANT
PENCIL BUTTON HOLSTER BLD 10FT (ELECTRODE) ×2 IMPLANT
SPONGE GAUZE 4X4 12PLY STER LF (GAUZE/BANDAGES/DRESSINGS) ×2 IMPLANT
STAPLER VISISTAT 35W (STAPLE) ×2 IMPLANT
STOCKINETTE 6  STRL (DRAPES) ×1
STOCKINETTE 6 STRL (DRAPES) ×1 IMPLANT
STRIP SUTURE WOUND CLOSURE 1/2 (SUTURE) IMPLANT
SUT MNCRL AB 3-0 PS2 18 (SUTURE) ×2 IMPLANT
SUT MNCRL AB 4-0 PS2 18 (SUTURE) IMPLANT
SUT MON AB 5-0 PS2 18 (SUTURE) IMPLANT
SYR BULB 3OZ (MISCELLANEOUS) ×2 IMPLANT
SYRINGE 10CC LL (SYRINGE) IMPLANT
UNDERPAD 30X30 (UNDERPADS AND DIAPERS) ×2 IMPLANT
WOUND MATRIX MESHED 2X2 (Tissue) ×1 IMPLANT

## 2015-06-22 NOTE — Anesthesia Procedure Notes (Signed)
Procedure Name: LMA Insertion Performed by: Terrance Mass Pre-anesthesia Checklist: Patient identified, Emergency Drugs available, Suction available and Patient being monitored Patient Re-evaluated:Patient Re-evaluated prior to inductionOxygen Delivery Method: Circle System Utilized Preoxygenation: Pre-oxygenation with 100% oxygen Intubation Type: IV induction Ventilation: Mask ventilation without difficulty LMA: LMA inserted LMA Size: 5.0 Number of attempts: 1 Placement Confirmation: positive ETCO2 Tube secured with: Tape Dental Injury: Teeth and Oropharynx as per pre-operative assessment

## 2015-06-22 NOTE — Discharge Instructions (Addendum)
See written instruction Resume antibiotics STAY OFF YOUR FOOT! Wear CAM boot at all times.    Post Anesthesia Home Care Instructions  Activity: Get plenty of rest for the remainder of the day. A responsible adult should stay with you for 24 hours following the procedure.  For the next 24 hours, DO NOT: -Drive a car -Paediatric nurse -Drink alcoholic beverages -Take any medication unless instructed by your physician -Make any legal decisions or sign important papers.  Meals: Start with liquid foods such as gelatin or soup. Progress to regular foods as tolerated. Avoid greasy, spicy, heavy foods. If nausea and/or vomiting occur, drink only clear liquids until the nausea and/or vomiting subsides. Call your physician if vomiting continues.  Special Instructions/Symptoms: Your throat may feel dry or sore from the anesthesia or the breathing tube placed in your throat during surgery. If this causes discomfort, gargle with warm salt water. The discomfort should disappear within 24 hours.  If you had a scopolamine patch placed behind your ear for the management of post- operative nausea and/or vomiting:  1. The medication in the patch is effective for 72 hours, after which it should be removed.  Wrap patch in a tissue and discard in the trash. Wash hands thoroughly with soap and water. 2. You may remove the patch earlier than 72 hours if you experience unpleasant side effects which may include dry mouth, dizziness or visual disturbances. 3. Avoid touching the patch. Wash your hands with soap and water after contact with the patch.

## 2015-06-22 NOTE — Anesthesia Postprocedure Evaluation (Signed)
Anesthesia Post Note  Patient: Stephen Medina  Procedure(s) Performed: Procedure(s) (LRB): DEBRIDEMENT WOUND (N/A) INTEGRA GRAFT APPLICATION (N/A)  Patient location during evaluation: PACU Anesthesia Type: General Level of consciousness: awake and alert Pain management: pain level controlled Vital Signs Assessment: post-procedure vital signs reviewed and stable Respiratory status: spontaneous breathing, nonlabored ventilation, respiratory function stable and patient connected to nasal cannula oxygen Cardiovascular status: blood pressure returned to baseline and stable Postop Assessment: no signs of nausea or vomiting Anesthetic complications: no    Last Vitals:  Filed Vitals:   06/22/15 0915 06/22/15 0945  BP: 149/73 128/61  Pulse: 56 55  Temp:  36.5 C  Resp: 18 20    Last Pain:  Filed Vitals:   06/22/15 0947  PainSc: 3                  Montez Hageman

## 2015-06-22 NOTE — Anesthesia Preprocedure Evaluation (Addendum)
Anesthesia Evaluation  Patient identified by MRN, date of birth, ID band Patient awake    Reviewed: Allergy & Precautions, NPO status , Patient's Chart, lab work & pertinent test results  Airway Mallampati: II  TM Distance: >3 FB Neck ROM: Full    Dental no notable dental hx.    Pulmonary neg pulmonary ROS,    Pulmonary exam normal breath sounds clear to auscultation       Cardiovascular hypertension, Pt. on medications + CAD and + Cardiac Stents (2005)  Normal cardiovascular exam Rhythm:Regular Rate:Normal     Neuro/Psych negative neurological ROS  negative psych ROS   GI/Hepatic Neg liver ROS, GERD  Medicated and Controlled,  Endo/Other  Morbid obesity  Renal/GU negative Renal ROS  negative genitourinary   Musculoskeletal negative musculoskeletal ROS (+)   Abdominal   Peds negative pediatric ROS (+)  Hematology negative hematology ROS (+)   Anesthesia Other Findings   Reproductive/Obstetrics negative OB ROS                            Anesthesia Physical Anesthesia Plan  ASA: II  Anesthesia Plan: General   Post-op Pain Management:    Induction: Intravenous  Airway Management Planned: LMA and Oral ETT  Additional Equipment:   Intra-op Plan:   Post-operative Plan: Extubation in OR  Informed Consent: I have reviewed the patients History and Physical, chart, labs and discussed the procedure including the risks, benefits and alternatives for the proposed anesthesia with the patient or authorized representative who has indicated his/her understanding and acceptance.   Dental advisory given  Plan Discussed with: CRNA  Anesthesia Plan Comments:         Anesthesia Quick Evaluation

## 2015-06-22 NOTE — H&P (Signed)
Anesthesia H&P Update: History and Physical Exam reviewed; patient is OK for planned anesthetic and procedure. ? ?

## 2015-06-22 NOTE — Transfer of Care (Signed)
Immediate Anesthesia Transfer of Care Note  Patient: Stephen Medina  Procedure(s) Performed: Procedure(s): DEBRIDEMENT WOUND (N/A) INTEGRA GRAFT APPLICATION (N/A)  Patient Location: PACU  Anesthesia Type:General  Level of Consciousness: awake and sedated  Airway & Oxygen Therapy: Patient Spontanous Breathing and Patient connected to face mask oxygen  Post-op Assessment: Report given to RN and Post -op Vital signs reviewed and stable  Post vital signs: Reviewed and stable  Last Vitals:  Filed Vitals:   06/22/15 0649  Pulse: 63  Temp: 36.7 C  Resp: 18    Last Pain:  Filed Vitals:   06/22/15 0650  PainSc: 7          Complications: No apparent anesthesia complications

## 2015-06-22 NOTE — Progress Notes (Signed)
On admission to PACU Stephen Medina rolling all over bed, c/o back pain. Moving feet and body from side to side. Stephen Rodriguez CRNA stated " you are not going to be able to get him comfortable!" Stephen Medina stated " I do not care about my damn foot just my back!" Repositioned Stephen Medina on side and placed pillows under his knees --- a little better. Explained to Stephen Medina I needed to know pain in foot unfortunately I could not really treat his back but  Pain medication could help.

## 2015-06-22 NOTE — Telephone Encounter (Addendum)
DR. Jacqualyn Posey WANTED ME TO REMIND PT NOT TO BE UP ON HIS FOOT, LEAVE THE DRESSING IN PLACE AND NO WEIGHT BEARING.  INFORMED PT OF INSTRUCTIONS AND HIS NEXT VISIT 06/29/2015 AT 245PM. Post op courtesy call-Pt states he's doing okay, went in to the office to get some closing set up, has had some throbbing like he expected by not too bad, been resting mostly.  I told pt not to be up on the foot too much and to leave the boot and dressing in place until seen 06/29/2015 and call with concerns.  Pt states understanding.

## 2015-06-22 NOTE — Brief Op Note (Signed)
06/22/2015  8:17 AM  PATIENT:  Stephen Medina  65 y.o. male  PRE-OPERATIVE DIAGNOSIS:  CHRONIC ULCERATION  POST-OPERATIVE DIAGNOSIS:  CHRONIC ULCERATION  PROCEDURE:  Procedure(s): DEBRIDEMENT WOUND (N/A) INTEGRA GRAFT APPLICATION (N/A)  SURGEON:  Surgeon(s) and Role:    * Trula Slade, DPM - Primary  PHYSICIAN ASSISTANT:   ASSISTANTS: none   ANESTHESIA:   general  EBL:  Total I/O In: 1000 [I.V.:1000] Out: 2 [Blood:2]  BLOOD ADMINISTERED:none  DRAINS: none   LOCAL MEDICATIONS USED:  OTHER 10 cc 1:1 mix 0.5% marcaine plain and 2% lidocaine plain  SPECIMEN:  Source of Specimen:  left foot ulcer  DISPOSITION OF SPECIMEN:  PATHOLOGY  COUNTS:  YES  TOURNIQUET:    DICTATION: .Dragon Dictation  PLAN OF CARE: Discharge to home after PACU  PATIENT DISPOSITION:  PACU - hemodynamically stable.   Delay start of Pharmacological VTE agent (>24hrs) due to surgical blood loss or risk of bleeding: not applicable

## 2015-06-25 ENCOUNTER — Encounter (HOSPITAL_BASED_OUTPATIENT_CLINIC_OR_DEPARTMENT_OTHER): Payer: Self-pay | Admitting: Podiatry

## 2015-06-25 ENCOUNTER — Encounter: Payer: Self-pay | Admitting: Podiatry

## 2015-06-25 NOTE — Op Note (Signed)
Surgeon: Celesta Gentile, DPM Assistants: none Pre-operative diagnosis: Left chronic nonhealing ulcer Post-operative diagnosis: same Procedure:Left foot ulcer debridement, Integra graft application Pathology: Specimen- Culture  Anesthesia: General  Hemostasis: Anatomic EBL: minimal Materials: Integra bilayered graft Injectables: 10 cc 1:1 mix 2% lidocaine and 0.5% Marcaine plain Complications: none  Indications for surgery: Stephen Medina has been undergoing treatment for chronic plantar medial foot ulceration for about 1 year. He has been undergoing multiple treatments including multiple wound debridements in the office as well as daily dressing changes. He underwent graft to the office which was not successful. Hand her went operative debridement previously. He presents today for wound debridement and graft application. I discussed them alternatives, risks, complications. No promises or guarantees were given as the outcome of the procedure and all questions were answered to the best of my ability.  Procedure in detail: The patient's both verbally and visually identified by myself the nursing staff and the anesthesia staff in the preoperative holding area. He was then transferred to the operative room via stretcher placed on the operative table in a supine position. Once under an adequate plane of anesthesia the left lower extremities and scrubbed prepped and draped in the normal sterile fashion.  Attention was then directed on the plantar medial aspect of the left foot on the ulceration. There is hyperkeratotic periwound which was debrided with a #15 blade scalpel. Next Misonix was utilized to debride the wound down to healthy, bleeding, granular wound base. There was a central area of probing however was not down the bone. There is no undermining or tunneling. There is no pus expressed. After debridement the wound measured 4 x 2.5 x 0.5 cm. hemostasis was achieved and the wound was copiously  irrigated. Wound cultures were obtained. Next the Integra graft was placed following manufacturer's guidelines and stapled in place. A bulky compression dressing was placed over the graft. At the conclusion of the procedure the surgical count was confirmed to be correct. He was unable from anesthesia and found to tolerate the procedure well any complications.  He has remained nonweightbearing. Continue antibiotics and pain medication for which her has at home. Follow-up in 1 week.  *I received text message from the nurse in the operating room after the surgery stating that one of the cultures was put in the wrong tube and could not be used. He has previous cultures.

## 2015-06-27 LAB — ANAEROBIC CULTURE: Gram Stain: NONE SEEN

## 2015-06-29 ENCOUNTER — Ambulatory Visit (INDEPENDENT_AMBULATORY_CARE_PROVIDER_SITE_OTHER): Payer: Medicare Other | Admitting: Podiatry

## 2015-06-29 DIAGNOSIS — L97929 Non-pressure chronic ulcer of unspecified part of left lower leg with unspecified severity: Secondary | ICD-10-CM

## 2015-06-29 DIAGNOSIS — Z9889 Other specified postprocedural states: Secondary | ICD-10-CM

## 2015-06-29 MED ORDER — CIPROFLOXACIN HCL 500 MG PO TABS: 500.0000 mg | ORAL_TABLET | Freq: Two times a day (BID) | ORAL | Status: DC

## 2015-07-03 ENCOUNTER — Encounter: Payer: Self-pay | Admitting: Sports Medicine

## 2015-07-03 ENCOUNTER — Ambulatory Visit (INDEPENDENT_AMBULATORY_CARE_PROVIDER_SITE_OTHER): Payer: Medicare Other | Admitting: Sports Medicine

## 2015-07-03 DIAGNOSIS — M79672 Pain in left foot: Secondary | ICD-10-CM

## 2015-07-03 DIAGNOSIS — Z9889 Other specified postprocedural states: Secondary | ICD-10-CM

## 2015-07-03 NOTE — Progress Notes (Signed)
Patient ID: Stephen Medina, male   DOB: 02-10-1950, 65 y.o.   MRN: [id number] Subjective: Stephen Medina is a 65 y.o. male patient seen today in office for dressing change and POV (DOS 06-22-15), S/P Left foot wound debridement and application of Integra graft. Patient denies pain at surgical site, Admits to increased drainage denies calf pain, denies headache, chest pain, shortness of breath, nausea, vomiting, fever, or chills. Patient states that is taking Cipro with no problems and has been using CAM walker and crutch with no problems. No other issues noted.   Patient Active Problem List   Diagnosis Date Noted  . Ulcer of toe of left foot ([place]) 04/16/2015  . Nonhealing ulcer of left lower extremity ([place]) 04/16/2015  . Ulcer of left foot ([place]) 12/04/2014  . Essential hypertension 02/02/2014  . Angina decubitus ([place]) 02/02/2014  . Morbid obesity ([place])   . Hyperlipidemia   . History of ulcer disease 10/19/2013  . CAD (coronary artery disease) 08/01/2013  . Lumbar spondylosis 04/01/2013    Current Outpatient Prescriptions on File Prior to Visit  Medication Sig Dispense Refill  . amoxicillin-clavulanate (AUGMENTIN) 875-125 MG tablet Take 1 tablet by mouth 2 (two) times daily. 20 tablet 0  . aspirin 81 MG tablet Take 81 mg by mouth daily.    . ciprofloxacin (CIPRO) 500 MG tablet Take 1 tablet (500 mg total) by mouth 2 (two) times daily. 20 tablet 0  . clopidogrel (PLAVIX) 75 MG tablet Take 75 mg by mouth daily.    Marland Kitchen HYDROcodone-acetaminophen (NORCO) 10-325 MG tablet Take 1 tablet by mouth every 6 (six) hours as needed. 30 tablet 0  . isosorbide mononitrate (IMDUR) 15 mg TB24 24 hr tablet Take 0.5 tablets (15 mg total) by mouth daily. 30 tablet 2  . lisinopril-hydrochlorothiazide (PRINZIDE,ZESTORETIC) 20-12.5 MG per tablet   3  . metoprolol succinate (TOPROL-XL) 25 MG 24 hr tablet   3  . pantoprazole (PROTONIX) 40 MG tablet TAKE 1 TABLET BY MOUTH EVERY DAY 90 tablet 0  .  quinapril-hydrochlorothiazide (ACCURETIC) 20-12.5 MG per tablet     . rosuvastatin (CRESTOR) 40 MG tablet Take 20 mg by mouth daily.      No current facility-administered medications on file prior to visit.    Allergies  Allergen Reactions  . Adhesive [Tape] Other (See Comments)    Blisters.  Paper tape ok.    Objective: There were no vitals filed for this visit.  General: No acute distress, AAOx3  Left foot: Staples and graft intact with no separation or hematoma formation at graft surgical site, mild peri-wound maceration, no active drainage, no frank odor, no warmth, no swelling or acute signs of infection, Capillary fill time <3 seconds in all digits, gross sensation present via light touch to left foot. No pain or crepitation with range of motion left foot.  No pain with calf compression.    Assessment and Plan:  Problem List Items Addressed This Visit    None    Visit Diagnoses    Status post left foot surgery    -  Primary    Left foot pain           -Patient seen and evaluated -Applied betadine to peri-wound maceration and dry sterile dressing to surgical site left foot secured with ACE wrap and stockinet  -Advised patient to make sure to keep dressings clean, dry, and intact to left surgical site -Cont with Cipro until completed -Advised patient to continue with CAM walker and crutches   -  Advised patient to limit activity to necessity  -Patient to follow up with Dr. Jacqualyn Posey as scheduled for post op care. In the meantime, patient to call office if any issues or problems arise.   Landis Martins, DPM

## 2015-07-05 NOTE — Progress Notes (Signed)
Patient ID: Stephen Medina, male   DOB: 08/04/1950, 65 y.o.   MRN: [id number]  Subjective: Stephen Medina is a 65 y.o. is seen today in office s/p right foot ulcer debridement and Integra graft application preformed 1 week ago. He states that overall he is doing well and his pain is controlled minimal not taking pain medicine this time. He has entrance His feet as much as possible although he does but wait to his foot. He does present today with crutches although walking in the cam boot with his wife present.  Denies any systemic complaints such as fevers, chills, nausea, vomiting. No calf pain, chest pain, shortness of breath.   Objective: General: No acute distress, AAOx3  DP/PT pulses palpable 2/4, CRT < 3 sec to all digits.  Protective sensation decreased. Motor function intact.  Right foot on the plantar medial aspect of the midfoot on the ulceration the bandage does have some serosanguineous strikethrough. Upon removal of bandage the Integra graft is adhering at about 50% of the wound however is lifting of uncertain areas on the periphery. There is no swelling erythema, ascending synovitis. There is no edema, increase in warmth. There is no pus expressed. No malodor.  No other areas of tenderness to bilateral lower extremities.  No other open lesions or pre-ulcerative lesions.  No pain with calf compression, swelling, warmth, erythema.   Assessment and Plan:  Status post right foot wound mimic, graft application  -Treatment options discussed including all alternatives, risks, and complications -At this time the dressing was changed in the bulky compression dressing was applied overlying the graft. Keep dressing clean, dry, intact. Discussed with him that if the bandage has strikethrough before seen him next week that he is to call the office for dressing change. -Strongly encourage nonweightbearing in a cam boot. He has the assistive devices he needs however he states that it is difficult  for him to stay nonweightbearing given his back issues and other comorbidities. -Monitor for any clinical signs or symptoms of infection and directed to call the office immediately should any occur or go to the ER. -Follow-up in 1 week or sooner if any problems arise. In the meantime, encouraged to call the office with any questions, concerns, change in symptoms.   Celesta Gentile, DPM

## 2015-07-06 ENCOUNTER — Encounter: Payer: Self-pay | Admitting: Podiatry

## 2015-07-06 ENCOUNTER — Ambulatory Visit (INDEPENDENT_AMBULATORY_CARE_PROVIDER_SITE_OTHER): Payer: Medicare Other | Admitting: Podiatry

## 2015-07-06 DIAGNOSIS — Z9889 Other specified postprocedural states: Secondary | ICD-10-CM

## 2015-07-06 DIAGNOSIS — L97929 Non-pressure chronic ulcer of unspecified part of left lower leg with unspecified severity: Secondary | ICD-10-CM

## 2015-07-09 NOTE — Progress Notes (Signed)
Patient ID: Stephen Medina, male   DOB: 06-24-1950, 65 y.o.   MRN: [id number]  Subjective: Stephen Medina is a 65 y.o. is seen today in office s/p right foot ulcer debridement and Integra graft application. He has tried to remain NWB as much as possible however due to back issues he has difficulty doing this. He has remained in a cam boot however.  Denies any systemic complaints such as fevers, chills, nausea, vomiting. No calf pain, chest pain, shortness of breath.   Objective: General: No acute distress, AAOx3  DP/PT pulses palpable 2/4, CRT < 3 sec to all digits.  Protective sensation decreased. Motor function intact.  Right foot on the plantar medial aspect of the midfoot on the ulceration the bandage does have some serosanguineous strikethrough although less than was present previously. Upon removal of bandage the Integra graft is adhering at about 50% of the wound however is lifting along the periphery. There is no surrounding erythema, ascending cellulitis. There is no edema, increase in warmth. There is no pus expressed. No malodor. No other areas of tenderness to bilateral lower extremities.  No other open lesions or pre-ulcerative lesions.  No pain with calf compression, swelling, warmth, erythema.   Assessment and Plan:  Status post right foot wound debridement, graft application  -Treatment options discussed including all alternatives, risks, and complications -Compression dressing was applied. Keep dressing clean, dry, intact. If there is strikethrough on the bandage to come to the office to have a changed. Otherwise I'll see him back next week. -Finish course of antibiotics -Nonweightbearing -Elevation -Follow-up in 1 week or sooner if any problems arise. In the meantime, encouraged to call the office with any questions, concerns, change in symptoms.   Celesta Gentile, DPM

## 2015-07-10 ENCOUNTER — Ambulatory Visit (INDEPENDENT_AMBULATORY_CARE_PROVIDER_SITE_OTHER): Payer: Medicare Other | Admitting: Sports Medicine

## 2015-07-10 VITALS — Temp 97.2°F

## 2015-07-10 DIAGNOSIS — M79672 Pain in left foot: Secondary | ICD-10-CM

## 2015-07-10 DIAGNOSIS — Z9889 Other specified postprocedural states: Secondary | ICD-10-CM

## 2015-07-10 DIAGNOSIS — L97929 Non-pressure chronic ulcer of unspecified part of left lower leg with unspecified severity: Secondary | ICD-10-CM

## 2015-07-10 NOTE — Progress Notes (Signed)
Patient ID: Stephen Medina, male   DOB: May 08, 1950, 65 y.o.   MRN: [id number] Patient was seen by nurse today for dressing change. No acute findings. Patient to follow up as scheduled with Dr. Jacqualyn Posey for continued post op care.

## 2015-07-13 ENCOUNTER — Encounter: Payer: Self-pay | Admitting: Podiatry

## 2015-07-13 ENCOUNTER — Ambulatory Visit (INDEPENDENT_AMBULATORY_CARE_PROVIDER_SITE_OTHER): Payer: Medicare Other | Admitting: Podiatry

## 2015-07-13 VITALS — BP 98/69 | HR 66 | Resp 12

## 2015-07-13 DIAGNOSIS — L97929 Non-pressure chronic ulcer of unspecified part of left lower leg with unspecified severity: Secondary | ICD-10-CM | POA: Diagnosis not present

## 2015-07-13 MED ORDER — CIPROFLOXACIN HCL 500 MG PO TABS: 500.0000 mg | ORAL_TABLET | Freq: Two times a day (BID) | ORAL | Status: DC

## 2015-07-14 ENCOUNTER — Encounter: Payer: Self-pay | Admitting: Podiatry

## 2015-07-14 NOTE — Progress Notes (Addendum)
Patient ID: GIOVANNI CHRZANOWSKI, male   DOB: 06-27-1950, 65 y.o.   MRN: [id number]  Subjective: ACY OBARA is a 65 y.o. is seen today in office s/p right foot ulcer debridement and Integra graft application. She still has some drainage coming from the area this is improving. He has continued on Cipro. He has been wearing the cam boot and tried to remain nonweightbearing as well as possible however he does walk in the boot with crutches to help him and his weightbearing. Denies any systemic complaints such as fevers, chills, nausea, vomiting. No calf pain, chest pain, shortness of breath.   Objective: General: No acute distress, AAOx3  DP/PT pulses palpable 2/4, CRT < 3 sec to all digits.  Protective sensation decreased. Motor function intact.  Right foot on the plantar medial aspect of the midfoot on the ulceration the bandage does have some serosanguineous strikethrough although again decreased today. The graft is lifting somewhat from the wound. There is no drainage or pus expressed today upon removal of the graft to the wound measures 3.9 x 2.5 cemented there is a superficial with a very nice granular wound base. There is no probing, undermining or tunneling. No clinical signs of infection.. There is no surrounding edema, erythema, increase in warmth around the wound. No other areas of tenderness to bilateral lower extremities.  No other open lesions or pre-ulcerative lesions.  No pain with calf compression, swelling, warmth, erythema.   Assessment and Plan:  Status post right foot wound debridement, graft application  -Treatment options discussed including all alternatives, risks, and complications -States were removed as well as the silicone layer of the graft. The area was cleaned. The wound does appear to be much more superficial and a very nice granular wound base. There is no probing, undermining or tunneling. I did apply an oasis graft over the wound and secured this with Steri-Strips and  Adaptic followed by a bulky dressing. I used a piece of graft and cut it to use a 3 x 3 piece of graft. Again discussed with him nonweightbearing at all times. -Continue Cipro. Refilled today. -Will see him back in 1 week for dressing change. *At next appointment, clean wound. I would probably put another oasis graft on the wound like I did today (the graft is in the cabinet with the other wound care supplies). I am keeping him on antibiotics due to the drainage. I will see him back in 1 week  Celesta Gentile, DPM

## 2015-07-18 ENCOUNTER — Encounter: Payer: Self-pay | Admitting: Podiatry

## 2015-07-18 ENCOUNTER — Ambulatory Visit (INDEPENDENT_AMBULATORY_CARE_PROVIDER_SITE_OTHER): Payer: Medicare Other | Admitting: Podiatry

## 2015-07-18 VITALS — BP 112/64 | HR 66 | Temp 97.0°F | Resp 16

## 2015-07-18 DIAGNOSIS — L97929 Non-pressure chronic ulcer of unspecified part of left lower leg with unspecified severity: Secondary | ICD-10-CM

## 2015-07-18 DIAGNOSIS — L97521 Non-pressure chronic ulcer of other part of left foot limited to breakdown of skin: Secondary | ICD-10-CM

## 2015-07-19 NOTE — Progress Notes (Signed)
Subjective:     Patient ID: Stephen Medina, male   DOB: 1950/03/14, 65 y.o.   MRN: [id number]  HPI patient presents stating it's been doing pretty good and has been draining this week but I haven't been able to stay off of it   Review of Systems     Objective:   Physical Exam Neurovascular status found to be unchanged with patient having a granulating ulceration plantar aspect left midfoot measuring approximately 4 cm x 4 cm. There is no proximal edema erythema drainage or any other indications of systemic pathology and at this point appears to be a localized process    Assessment:     Chronic plantar ulceration left which appears to be granulating in and showing good process with Oasis graft    Plan:     H&P and condition reviewed with patient. I went ahead and cleaned the area today and prepped the tissue and applied a new Oasis graft followed by Steri-Strips and Adaptic dressing. Applied fluff dressing to the area and instructed on continued nonweightbearing and follow up by Dr. Jacqualyn Posey in 1 week

## 2015-07-23 ENCOUNTER — Ambulatory Visit (INDEPENDENT_AMBULATORY_CARE_PROVIDER_SITE_OTHER): Payer: Medicare Other | Admitting: Podiatry

## 2015-07-23 ENCOUNTER — Encounter: Payer: Self-pay | Admitting: Podiatry

## 2015-07-23 DIAGNOSIS — L97929 Non-pressure chronic ulcer of unspecified part of left lower leg with unspecified severity: Secondary | ICD-10-CM | POA: Diagnosis not present

## 2015-07-23 NOTE — Progress Notes (Signed)
Patient ID: Stephen Medina, male   DOB: [birth year], 65 y.o.   MRN: [id number]  Subjective: Stephen Medina is a 65 y.o. is seen today in office today for continued care of ulceration to his right foot. He still is having some drainage in the wound however it is improving. He has had decreased pain to the area. Denies any pus. Denies any swelling or redness to his feet. Denies any systemic complaints such as fevers, chills, nausea, vomiting. No calf pain, chest pain, shortness of breath.   Objective: General: No acute distress, AAOx3; reasons walking cam boot. DP/PT pulses palpable 2/4, CRT < 3 sec to all digits.  Protective sensation decreased. Motor function intact.  Right foot on the plantar medial aspect of the midfoot on the ulceration the bandage does have some serosanguineous strikethrough but not as much. Today the wound does measure about the same size however somewhat smaller and there is some evidence of new skin around the periphery of the wound. The wound base is granular and superficial. There is no probing, undermining time. There is no surrounding erythema, ascending saline, fluctuance, crepitus, malodor, drainage or pus. No other open lesions are present. No pain with calf compression, swelling, warmth, erythema.   Assessment and Plan:  Status post right foot wound debridement, graft application  -Treatment options discussed including all alternatives, risks, and complications -Wound was debrided to [place], bleeding tissue with a scalpel. The area was cleaned and hemostasis achieved. A pice of graft with the size of 3 x 3 cm was cut and placed over the wound and was secured in place with Steri-Strips. Adaptic was applied followed by a bulky bandage. Daily dressing clean, dry, intact. -Continue nonweightbearing in CAM boot. -Follow-up on Friday or sooner if any issues are to arise.  Celesta Gentile, DPM

## 2015-07-27 ENCOUNTER — Ambulatory Visit: Payer: Self-pay

## 2015-07-27 ENCOUNTER — Ambulatory Visit (INDEPENDENT_AMBULATORY_CARE_PROVIDER_SITE_OTHER): Payer: Medicare Other | Admitting: Podiatry

## 2015-07-27 ENCOUNTER — Encounter: Payer: Self-pay | Admitting: Podiatry

## 2015-07-27 ENCOUNTER — Ambulatory Visit (INDEPENDENT_AMBULATORY_CARE_PROVIDER_SITE_OTHER): Payer: Medicare Other

## 2015-07-27 VITALS — BP 122/76 | HR 60 | Resp 16

## 2015-07-27 DIAGNOSIS — M79672 Pain in left foot: Secondary | ICD-10-CM | POA: Diagnosis not present

## 2015-07-27 DIAGNOSIS — L89891 Pressure ulcer of other site, stage 1: Secondary | ICD-10-CM | POA: Diagnosis not present

## 2015-07-27 DIAGNOSIS — Z9889 Other specified postprocedural states: Secondary | ICD-10-CM

## 2015-07-27 DIAGNOSIS — L97511 Non-pressure chronic ulcer of other part of right foot limited to breakdown of skin: Secondary | ICD-10-CM

## 2015-08-03 ENCOUNTER — Encounter: Payer: Self-pay | Admitting: Podiatry

## 2015-08-03 ENCOUNTER — Ambulatory Visit (INDEPENDENT_AMBULATORY_CARE_PROVIDER_SITE_OTHER): Payer: Medicare Other | Admitting: Podiatry

## 2015-08-03 VITALS — BP 131/74 | HR 73 | Resp 16

## 2015-08-03 DIAGNOSIS — L97511 Non-pressure chronic ulcer of other part of right foot limited to breakdown of skin: Secondary | ICD-10-CM

## 2015-08-05 NOTE — Progress Notes (Signed)
Patient ID: Stephen Medina, male   DOB: 03-03-1950, 65 y.o.   MRN: [id number]  Subjective: Stephen Medina is a 65 y.o. is seen today in office today for continued care of ulceration to his right foot. He still is having some drainage in the wound however it is improving. He has had decreased pain to the area. Denies any pus. Denies any swelling or redness to his feet. He has remained in the CAM boot but WB. Denies any systemic complaints such as fevers, chills, nausea, vomiting. No calf pain, chest pain, shortness of breath.   Objective: General: No acute distress, AAOx3; reasons walking cam boot. DP/PT pulses palpable 2/4, CRT < 3 sec to all digits.  Protective sensation decreased. Motor function intact.  Right foot on the plantar medial aspect of the midfoot on the ulceration the bandage does have some serosanguineous strikethrough but does appear to be decreased. The wound base is granular and there is hyperkerotic tissue around the ulcer. There is no surrounding erythema, ascending synovitis, fluctuance, crepitus, malodor. Overall the wound does appear to be somewhat improved. No other open lesions are present. No pain with calf compression, swelling, warmth, erythema.   Assessment and Plan:  Status post right foot wound debridement  -Treatment options discussed including all alternatives, risks, and complications -At this time the wound appears to be healing in [place]. Hyperkeratotic tissue is debrided with a scalpel down to healthy tissue and the wound was debrided to a granular wound base. Hemostasis was achieved in the area was cleaned. Continue with antibiotic limited dressing changes for now to the [place] Brow can leave the area and covered at night or dry bandage to help dry the wound. -Monitor signs or symptoms of infection to call the office immediately should any occur. -Follow-up in 1 week or sooner if needed. Encouraged to call any questions or concerns or any changes symptoms.  Celesta Gentile, DPM

## 2015-08-05 NOTE — Progress Notes (Signed)
Patient ID: Stephen Medina, male   DOB: 1950-05-19, 65 y.o.   MRN: [id number]  Subjective: Stephen Medina is a 65 y.o. is seen today in office today for continued care of ulceration to his right foot. Since last appointment he states his been present and dry bandage on a night and changed the bandage daily with may be a small amount of antibiotic ointment he feels that he is doing much better. He states this is the best the wound is looking quite some time. Denies any drainage or pus. Denies he swelling erythema, red streaking. No malodor.  Denies any systemic complaints such as fevers, chills, nausea, vomiting. No calf pain, chest pain, shortness of breath.   Objective: General: No acute distress, AAOx3; presents walking cam boot. DP/PT pulses palpable 2/4, CRT < 3 sec to all digits.  Protective sensation decreased. Motor function intact.  10 ulceration appears to be small compared to previous appointment. The wound is granular and superficial. There is no probing, undermining or tunneling. There is no surrounding erythema, ascending saline disc, fluctuance, crepitus, malodor. No other open lesions are present lesion within about this time. No pain with calf compression, swelling, warmth, erythema.   Assessment and Plan:  Status post right foot wound debridement; wound has improved.   -Treatment options discussed including all alternatives, risks, and complications -At this time the wound appears to be improved in a superficial granular. There is no specific and hyperkeratotic tissue around the wound. For now continue with a dry dressing. The day and a small metastatic and buttock ointment. The drainage is also significantly improved. Monitor for signs or symptoms of infection to call the office should any occur or go to the ER. Follow-up in 1 week or sooner if needed. Encouraged to call any questions or concerns or any changes in the meantime.  Celesta Gentile, DPM

## 2015-08-07 ENCOUNTER — Encounter: Payer: Self-pay | Admitting: Podiatry

## 2015-08-09 ENCOUNTER — Other Ambulatory Visit: Payer: Self-pay | Admitting: Neurosurgery

## 2015-08-09 DIAGNOSIS — M48061 Spinal stenosis, lumbar region without neurogenic claudication: Secondary | ICD-10-CM

## 2015-08-10 ENCOUNTER — Encounter: Payer: Self-pay | Admitting: Podiatry

## 2015-08-10 ENCOUNTER — Ambulatory Visit (INDEPENDENT_AMBULATORY_CARE_PROVIDER_SITE_OTHER): Payer: Medicare Other | Admitting: Podiatry

## 2015-08-10 VITALS — BP 130/76 | HR 64 | Resp 12

## 2015-08-10 DIAGNOSIS — L97511 Non-pressure chronic ulcer of other part of right foot limited to breakdown of skin: Secondary | ICD-10-CM

## 2015-08-11 NOTE — Progress Notes (Signed)
Patient ID: Stephen Medina, male   DOB: [birth year], 65 y.o.   MRN: [id number]  Subjective: Stephen Medina is a 65 y.o. is seen today in office today for continued care of ulceration to his right foot. She states that he believes the wound is healing he has had very minimal drainage to the area. Denies any swelling redness or red streaks. Denies any pain at this time.  Denies any systemic complaints such as fevers, chills, nausea, vomiting. No calf pain, chest pain, shortness of breath.   Objective: General: No acute distress, AAOx3; presents walking cam boot. DP/PT pulses palpable 2/4, CRT < 3 sec to all digits.  Protective sensation decreased. Motor function intact.  10 ulceration appears to be small compared to previous appointment. Wound measures possibly 1.5 x 1 cm. The wound is granular and superficial. There is no probing, undermining or tunneling. There is no surrounding erythema, ascending cellulitis, fluctuance, crepitus, malodor. No other open lesions are present lesion within about this time. No pain with calf compression, swelling, warmth, erythema.   Assessment and Plan:  Status post right foot wound debridement; wound has improved.   -Treatment options discussed including all alternatives, risks, and complications -There is a small amount of hyperkeratotic periwound. Sharply debrided with a scalpel down to granular tissue. Continue with daily dressing changes with a small amount and a buttock ointment. Beginning with the area uncovered at night to help the area dry which is been doing. This has been helping. -Up in 2 weeks or sooner if needed.  -Monitor for signs or symptoms of infection to call the office should any occur or go to the ER. Encouraged to call any questions or concerns or any changes in the meantime.  Celesta Gentile, DPM

## 2015-08-17 ENCOUNTER — Encounter: Payer: Medicare Other | Admitting: Podiatry

## 2015-08-21 ENCOUNTER — Ambulatory Visit
Admission: RE | Admit: 2015-08-21 | Discharge: 2015-08-21 | Disposition: A | Discharge status: Home / Self Care | Payer: Medicare Other | Source: Ambulatory Visit | Attending: Neurosurgery | Admitting: Neurosurgery

## 2015-08-21 DIAGNOSIS — M48061 Spinal stenosis, lumbar region without neurogenic claudication: Secondary | ICD-10-CM

## 2015-08-21 MED ORDER — GADOBENATE DIMEGLUMINE 529 MG/ML IV SOLN
20.0000 mL | Freq: Once | INTRAVENOUS | Status: AC | PRN
  Administered 2015-08-21: 20 mL via INTRAVENOUS

## 2015-08-23 ENCOUNTER — Other Ambulatory Visit: Payer: Self-pay | Admitting: Neurosurgery

## 2015-08-23 DIAGNOSIS — M5126 Other intervertebral disc displacement, lumbar region: Secondary | ICD-10-CM

## 2015-08-23 HISTORY — DX: Other intervertebral disc displacement, lumbar region: M51.26

## 2015-08-27 ENCOUNTER — Other Ambulatory Visit: Payer: Self-pay | Admitting: Neurosurgery

## 2015-08-29 ENCOUNTER — Encounter (HOSPITAL_COMMUNITY)
Admission: RE | Admit: 2015-08-29 | Discharge: 2015-08-29 | Disposition: A | Discharge status: Home / Self Care | Payer: Medicare Other | Source: Ambulatory Visit | Attending: Neurosurgery | Admitting: Neurosurgery

## 2015-08-29 ENCOUNTER — Encounter (HOSPITAL_COMMUNITY): Payer: Self-pay

## 2015-08-29 DIAGNOSIS — Z01812 Encounter for preprocedural laboratory examination: Secondary | ICD-10-CM | POA: Diagnosis not present

## 2015-08-29 DIAGNOSIS — Z7982 Long term (current) use of aspirin: Secondary | ICD-10-CM | POA: Diagnosis not present

## 2015-08-29 DIAGNOSIS — K219 Gastro-esophageal reflux disease without esophagitis: Secondary | ICD-10-CM | POA: Diagnosis not present

## 2015-08-29 DIAGNOSIS — I10 Essential (primary) hypertension: Secondary | ICD-10-CM | POA: Diagnosis not present

## 2015-08-29 DIAGNOSIS — Z955 Presence of coronary angioplasty implant and graft: Secondary | ICD-10-CM | POA: Diagnosis not present

## 2015-08-29 DIAGNOSIS — Z981 Arthrodesis status: Secondary | ICD-10-CM | POA: Insufficient documentation

## 2015-08-29 DIAGNOSIS — I2510 Atherosclerotic heart disease of native coronary artery without angina pectoris: Secondary | ICD-10-CM | POA: Insufficient documentation

## 2015-08-29 DIAGNOSIS — Z7902 Long term (current) use of antithrombotics/antiplatelets: Secondary | ICD-10-CM | POA: Diagnosis not present

## 2015-08-29 DIAGNOSIS — M5126 Other intervertebral disc displacement, lumbar region: Secondary | ICD-10-CM | POA: Insufficient documentation

## 2015-08-29 DIAGNOSIS — E785 Hyperlipidemia, unspecified: Secondary | ICD-10-CM | POA: Insufficient documentation

## 2015-08-29 DIAGNOSIS — Z79899 Other long term (current) drug therapy: Secondary | ICD-10-CM | POA: Diagnosis not present

## 2015-08-29 DIAGNOSIS — I712 Thoracic aortic aneurysm, without rupture: Secondary | ICD-10-CM | POA: Insufficient documentation

## 2015-08-29 DIAGNOSIS — Z01818 Encounter for other preprocedural examination: Secondary | ICD-10-CM | POA: Diagnosis present

## 2015-08-29 HISTORY — DX: Other intervertebral disc displacement, lumbar region: M51.26

## 2015-08-29 HISTORY — DX: Presence of spectacles and contact lenses: Z97.3

## 2015-08-29 LAB — CBC
HCT: 38.2 % — ABNORMAL LOW (ref 39.0–52.0)
Hemoglobin: 12.4 g/dL — ABNORMAL LOW (ref 13.0–17.0)
MCH: 28.8 pg (ref 26.0–34.0)
MCHC: 32.5 g/dL (ref 30.0–36.0)
MCV: 88.6 fL (ref 78.0–100.0)
Platelets: 242 10*3/uL (ref 150–400)
RBC: 4.31 MIL/uL (ref 4.22–5.81)
RDW: 14.2 % (ref 11.5–15.5)
WBC: 8.1 10*3/uL (ref 4.0–10.5)

## 2015-08-29 LAB — BASIC METABOLIC PANEL
Anion gap: 10 (ref 5–15)
BUN: 19 mg/dL (ref 6–20)
CO2: 21 mmol/L — ABNORMAL LOW (ref 22–32)
Calcium: 9.3 mg/dL (ref 8.9–10.3)
Chloride: 109 mmol/L (ref 101–111)
Creatinine, Ser: 1.34 mg/dL — ABNORMAL HIGH (ref 0.61–1.24)
GFR calc Af Amer: >60 mL/min (ref >60)
GFR calc non Af Amer: 55 mL/min — ABNORMAL LOW (ref >60)
Glucose, Bld: 113 mg/dL — ABNORMAL HIGH (ref 65–99)
Potassium: 4 mmol/L (ref 3.5–5.1)
Sodium: 140 mmol/L (ref 135–145)

## 2015-08-29 LAB — SURGICAL PCR SCREEN
MRSA, PCR: NEGATIVE
Staphylococcus aureus: NEGATIVE

## 2015-08-29 NOTE — Pre-Procedure Instructions (Signed)
Stephen Medina  08/29/2015      [facility] - [place], [place] - [address] [place] [address] Phone: [phone] Fax: [phone]    Your procedure is scheduled on Monday, September 03, 2015  Report to [facility] Admitting at 6:00 A.M. ( per MD)  Call this number if you have problems the morning of surgery:  [phone]   Remember:  Do not eat food or drink liquids after midnight Sunday, September 02, 2015  Take these medicines the morning of surgery with A SIP OF WATER : Isosorbide mononitrate ( imdur), Metoprolol ( Toprol-XL), if needed: Oxycodone for pain Stop taking Aspirin ( already stopped),  Plavix ( already stopped), Vitamins, Fish oil and herbal medications. Do not take any NSAIDs ie: Ibuprofen, Advil, Naproxen, BC and Goody Powder or any medication containing Aspirin; stop now.  Do not wear jewelry, make-up or nail polish.  Do not wear lotions, powders, or perfumes.  You may not wear deoderant.  Do not shave 48 hours prior to surgery.  Men may shave face and neck.  Do not bring valuables to the hospital.  [facility] is not responsible for any belongings or valuables.  Contacts, dentures or bridgework may not be worn into surgery.  Leave your suitcase in the car.  After surgery it may be brought to your room.  For patients admitted to the hospital, discharge time will be determined by your treatment team.  Patients discharged the day of surgery will not be allowed to drive home.   Name and phone number of your driver:   Special instructions:  [facility] - Preparing for Surgery  Before surgery, you can play an important role.  Because skin is not sterile, your skin needs to be as free of germs as possible.  You can reduce the number of germs on you skin by washing with CHG (chlorahexidine gluconate) soap before surgery.  CHG is an antiseptic cleaner which kills germs and bonds with the skin to continue killing germs even after washing.  Please  DO NOT use if you have an allergy to CHG or antibacterial soaps.  If your skin becomes reddened/irritated stop using the CHG and inform your nurse when you arrive at Short Stay.  Do not shave (including legs and underarms) for at least 48 hours prior to the first CHG shower.  You may shave your face.  Please follow these instructions carefully:   1.  Shower with CHG Soap the night before surgery and the morning of Surgery.  2.  If you choose to wash your hair, wash your hair first as usual with your normal shampoo.  3.  After you shampoo, rinse your hair and body thoroughly to remove the Shampoo.  4.  Use CHG as you would any other liquid soap.  You can apply chg directly  to the skin and wash gently with scrungie or a clean washcloth.  5.  Apply the CHG Soap to your body ONLY FROM THE NECK DOWN.  Do not use on open wounds or open sores.  Avoid contact with your eyes, ears, mouth and genitals (private parts).  Wash genitals (private parts) with your normal soap.  6.  Wash thoroughly, paying special attention to the area where your surgery will be performed.  7.  Thoroughly rinse your body with warm water from the neck down.  8.  DO NOT shower/wash with your normal soap after using and rinsing off the  CHG Soap.  9.  Pat yourself dry with a clean towel.            10.  Wear clean pajamas.            11.  Place clean sheets on your bed the night of your first shower and do not sleep with pets.  Day of Surgery  Do not apply any lotions/deodOrants the morning of surgery.  Please wear clean clothes to the hospital/surgery center.  Please read over the following fact sheets that you were given. Pain Booklet, Coughing and Deep Breathing, MRSA Information and Surgical Site Infection Prevention

## 2015-08-29 NOTE — Progress Notes (Signed)
   08/29/15 1349  OBSTRUCTIVE SLEEP APNEA  Have you ever been diagnosed with sleep apnea through a sleep study? No  Do you snore loudly (loud enough to be heard through closed doors)?  0  Do you often feel tired, fatigued, or sleepy during the daytime (such as falling asleep during driving or talking to someone)? 0  Has anyone observed you stop breathing during your sleep? 0  Do you have, or are you being treated for high blood pressure? 1  BMI more than 35 kg/m2? 1  Age > 50 (1-yes) 1  Neck circumference greater than:Male 16 inches or larger, Male 17inches or larger? 1  Male Gender (Yes=1) 1  Obstructive Sleep Apnea Score 5

## 2015-08-29 NOTE — Progress Notes (Signed)
Pt stated that his last dose of both Aspirin and Plavix was this past Sunday.

## 2015-08-29 NOTE — Progress Notes (Signed)
Pt denies SOB and chest pain but is under the care of [facility], Cardiology. Pt stated that his last visit with Dr. Wynonia Lawman was February 2017. Pt denies having a chest x ray within the last year. Pt stated that labs were drawn on August 16, 2015 at [facility] which included an A1c; records requested. Pt chart forwarded to anesthesia to review cardiac history. Lorriane Shire, CMA,  stated that Dr. Christella Noa is unable to sign pt orders because he is on vacation until 09/03/15.

## 2015-08-30 NOTE — Progress Notes (Addendum)
Anesthesia chart review: Patient is a 65 year old male scheduled for L5-S1 microdiscectomy, left on 09/03/15 by Dr. Christella Noa.  History includes non-smoker, CAD s/p DES DIAG1 '05 [facility]), HTN, HLD, GERD, tonsillectomy, appendectomy, L4-5 PLIF 09/01/13, debridement chronic left foot ulcer 06/22/15. BMI is consistent with morbid obesity.   PCP is listed as Dr. Sharilyn Sites, last visit 08/16/15 for CPE.  Cardiologist is Dr. Tollie Eth. Office faxed over a request for clearance on 08/23/15, but still awaiting response. Nikki to follow-up today.   Meds include ASA, Plavix, Imdur, losartan-HCTZ, Toprol XL, Percocet, Protonix, Crestor. He reported last ASA/Plavix dose 08/26/15.   06/20/15 EKG: NSR, cannot rule out anterior infarct (age undetermined). Since last tracing no significant change.  02/09/14 LHC: Coronary angiography: Coronary dominance: right - Left mainstem: Normal - Left anterior descending (LAD): The LAD has mild diffuse calcification in the proximal vessel. There are mild irregularities less than 20%. The first diagonal has a stent in place and it is patent. The second diagonal is a bifurcating vessel with 20% ostial narrowing. - Left circumflex (LCx): The LCx gives rise to 3 OM branches. It is normal. - Right coronary artery (RCA): There is moderate calcification in the proximal RCA with focal 30% stenosis.  - Left ventriculography: Left ventricular systolic function is normal, LVEF is estimated at 55-65%, there is no significant mitral regurgitation. The proximal aortic root appears enlarged.  Final Conclusions:   1. Nonobstructive CAD, patent stent in the first diagonal. 2. Normal LV function. Recommendations: continue medical therapy. Consider CT to assess Aortic root.   08/05/13 Echo: Study Conclusions - Left ventricle: The cavity size was normal. Systolic function was normal. The estimated ejection fraction was in the range of 55% to 60%. Wall motion was normal; there were no  regional wall motion abnormalities. Features are consistent with a pseudonormal left ventricular filling pattern, with concomitant abnormalrelaxation and increased filling pressure (grade 2 diastolicdysfunction). - Aortic root: The aortic root was moderately dilated. - Left atrium: The atrium was mildly dilated. 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  03/12/15 Chest CTA: IMPRESSION: 1. Previously noted left upper lobe pulmonary nodule has decreased in size and currently measures only 4 mm. This is considered benign, and no future imaging followup is recommended. 2. 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 Recommend annual imaging followup by CTA or MRA. This recommendation follows 2010 ACCF/AHA/AATS/ACR/ASA/SCA/SCAI/SIR/STS/SVM Guidelines for the Diagnosis and Management of Patients with Thoracic Aortic Disease. Circulation. 2010; 121: [id number]. 3. Atherosclerosis, including 3 vessel coronary artery disease. Please note that although the presence of coronary artery calcium documents the presence of coronary artery disease, the severity of this disease and any potential stenosis cannot be assessed on this non-gated CT examination. Assessment for potential risk factor modification, dietary therapy or pharmacologic therapy may be warranted, if clinically indicated.  Preoperative labs noted. Creatinine 1.34. H&H 12.4/38.2. Glucose 113. A1c 6.4 on 08/16/15 Larene Pickett).  Will revisit chart once additional cardiology records/clearance received.  George Hugh [facility] Short Stay Center/Anesthesiology Phone [phone] 08/30/2015 12:01 PM  Addendum: Last office note received from Dr. Wynonia Lawman, last visit 04/16/15. One year follow-up chest CT recommended to re-evaluate 4.4 cm TAA. He was cleared for foot surgery, if needed, at that time. I also  received a signed cardiac clearance note by Dr. Wynonia Lawman for this procedure (dated 08/28/15; placed in hard chart).  Ebony Hail  [facility] Short Stay Center/Anesthesiology Phone [phone] 08/31/2015 3:10 PM

## 2015-08-31 ENCOUNTER — Ambulatory Visit (INDEPENDENT_AMBULATORY_CARE_PROVIDER_SITE_OTHER): Payer: Medicare Other | Admitting: Podiatry

## 2015-08-31 ENCOUNTER — Encounter: Payer: Self-pay | Admitting: Podiatry

## 2015-08-31 DIAGNOSIS — L97929 Non-pressure chronic ulcer of unspecified part of left lower leg with unspecified severity: Secondary | ICD-10-CM

## 2015-08-31 DIAGNOSIS — L89891 Pressure ulcer of other site, stage 1: Secondary | ICD-10-CM | POA: Diagnosis not present

## 2015-09-03 ENCOUNTER — Ambulatory Visit (HOSPITAL_COMMUNITY): Payer: Medicare Other | Admitting: Anesthesiology

## 2015-09-03 ENCOUNTER — Ambulatory Visit (HOSPITAL_COMMUNITY): Payer: Medicare Other

## 2015-09-03 ENCOUNTER — Encounter (HOSPITAL_COMMUNITY)
Admission: RE | Disposition: A | Discharge status: Home / Self Care | Payer: Self-pay | Source: Ambulatory Visit | Attending: Neurosurgery

## 2015-09-03 ENCOUNTER — Encounter (HOSPITAL_COMMUNITY): Payer: Self-pay | Admitting: *Deleted

## 2015-09-03 ENCOUNTER — Observation Stay (HOSPITAL_COMMUNITY)
Admission: RE | Admit: 2015-09-03 | Discharge: 2015-09-03 | Disposition: A | Discharge status: Home / Self Care | Payer: Medicare Other | Source: Ambulatory Visit | Attending: Neurosurgery | Admitting: Neurosurgery

## 2015-09-03 ENCOUNTER — Ambulatory Visit (HOSPITAL_COMMUNITY): Payer: Medicare Other | Admitting: Vascular Surgery

## 2015-09-03 DIAGNOSIS — E785 Hyperlipidemia, unspecified: Secondary | ICD-10-CM | POA: Insufficient documentation

## 2015-09-03 DIAGNOSIS — I10 Essential (primary) hypertension: Secondary | ICD-10-CM | POA: Diagnosis not present

## 2015-09-03 DIAGNOSIS — F17290 Nicotine dependence, other tobacco product, uncomplicated: Secondary | ICD-10-CM | POA: Diagnosis not present

## 2015-09-03 DIAGNOSIS — K219 Gastro-esophageal reflux disease without esophagitis: Secondary | ICD-10-CM | POA: Diagnosis not present

## 2015-09-03 DIAGNOSIS — Z419 Encounter for procedure for purposes other than remedying health state, unspecified: Secondary | ICD-10-CM

## 2015-09-03 DIAGNOSIS — Z6841 Body Mass Index (BMI) 40.0 and over, adult: Secondary | ICD-10-CM | POA: Diagnosis not present

## 2015-09-03 DIAGNOSIS — Z8249 Family history of ischemic heart disease and other diseases of the circulatory system: Secondary | ICD-10-CM | POA: Insufficient documentation

## 2015-09-03 DIAGNOSIS — Z7902 Long term (current) use of antithrombotics/antiplatelets: Secondary | ICD-10-CM | POA: Diagnosis not present

## 2015-09-03 DIAGNOSIS — I2510 Atherosclerotic heart disease of native coronary artery without angina pectoris: Secondary | ICD-10-CM | POA: Insufficient documentation

## 2015-09-03 DIAGNOSIS — Z79899 Other long term (current) drug therapy: Secondary | ICD-10-CM | POA: Diagnosis not present

## 2015-09-03 DIAGNOSIS — M5126 Other intervertebral disc displacement, lumbar region: Secondary | ICD-10-CM

## 2015-09-03 DIAGNOSIS — M1990 Unspecified osteoarthritis, unspecified site: Secondary | ICD-10-CM | POA: Diagnosis not present

## 2015-09-03 DIAGNOSIS — Z7982 Long term (current) use of aspirin: Secondary | ICD-10-CM | POA: Diagnosis not present

## 2015-09-03 HISTORY — DX: Other intervertebral disc displacement, lumbar region: M51.26

## 2015-09-03 HISTORY — PX: LUMBAR LAMINECTOMY/DECOMPRESSION MICRODISCECTOMY: SHX5026

## 2015-09-03 SURGERY — LUMBAR LAMINECTOMY/DECOMPRESSION MICRODISCECTOMY 1 LEVEL
Anesthesia: General | Laterality: Left

## 2015-09-03 MED ORDER — ONDANSETRON HCL 4 MG/2ML IJ SOLN: 4.0000 mg | Freq: Once | INTRAMUSCULAR | Status: DC | PRN

## 2015-09-03 MED ORDER — LIDOCAINE 2% (20 MG/ML) 5 ML SYRINGE
INTRAMUSCULAR | Status: AC
  Filled 2015-09-03: qty 5

## 2015-09-03 MED ORDER — LOSARTAN POTASSIUM-HCTZ 100-12.5 MG PO TABS: 1.0000 | ORAL_TABLET | Freq: Every day | ORAL | Status: DC

## 2015-09-03 MED ORDER — THROMBIN 5000 UNITS EX SOLR
CUTANEOUS | Status: DC | PRN
  Administered 2015-09-03: 5000 [IU] via TOPICAL

## 2015-09-03 MED ORDER — SUGAMMADEX SODIUM 200 MG/2ML IV SOLN
INTRAVENOUS | Status: DC | PRN
  Administered 2015-09-03: 300 mg via INTRAVENOUS

## 2015-09-03 MED ORDER — FENTANYL CITRATE (PF) 100 MCG/2ML IJ SOLN
INTRAMUSCULAR | Status: AC
  Administered 2015-09-03: 25 ug via INTRAVENOUS
  Filled 2015-09-03: qty 2

## 2015-09-03 MED ORDER — ARTIFICIAL TEARS OP OINT
TOPICAL_OINTMENT | OPHTHALMIC | Status: AC
  Filled 2015-09-03: qty 3.5

## 2015-09-03 MED ORDER — PHENYLEPHRINE 40 MCG/ML (10ML) SYRINGE FOR IV PUSH (FOR BLOOD PRESSURE SUPPORT)
PREFILLED_SYRINGE | INTRAVENOUS | Status: DC | PRN
  Administered 2015-09-03: 80 ug via INTRAVENOUS

## 2015-09-03 MED ORDER — ONDANSETRON HCL 4 MG/2ML IJ SOLN
INTRAMUSCULAR | Status: AC
  Filled 2015-09-03: qty 2

## 2015-09-03 MED ORDER — METOPROLOL SUCCINATE ER 25 MG PO TB24: 25.0000 mg | ORAL_TABLET | Freq: Every day | ORAL | Status: DC

## 2015-09-03 MED ORDER — MENTHOL 3 MG MT LOZG
1.0000 | LOZENGE | OROMUCOSAL | Status: DC | PRN
Start: 2015-09-03 — End: 2015-09-03

## 2015-09-03 MED ORDER — FENTANYL CITRATE (PF) 100 MCG/2ML IJ SOLN
INTRAMUSCULAR | Status: AC
  Filled 2015-09-03: qty 2

## 2015-09-03 MED ORDER — FENTANYL CITRATE (PF) 100 MCG/2ML IJ SOLN
INTRAMUSCULAR | Status: DC | PRN
  Administered 2015-09-03: 25 ug via INTRAVENOUS

## 2015-09-03 MED ORDER — LOSARTAN POTASSIUM 50 MG PO TABS: 100.0000 mg | ORAL_TABLET | Freq: Every day | ORAL | Status: DC

## 2015-09-03 MED ORDER — PROPOFOL 10 MG/ML IV BOLUS
INTRAVENOUS | Status: DC | PRN
  Administered 2015-09-03: 200 mg via INTRAVENOUS

## 2015-09-03 MED ORDER — MIDAZOLAM HCL 2 MG/2ML IJ SOLN
INTRAMUSCULAR | Status: AC
  Filled 2015-09-03: qty 2

## 2015-09-03 MED ORDER — EPHEDRINE 5 MG/ML INJ
INTRAVENOUS | Status: AC
  Filled 2015-09-03: qty 10

## 2015-09-03 MED ORDER — FENTANYL CITRATE (PF) 100 MCG/2ML IJ SOLN
INTRAMUSCULAR | Status: DC | PRN
  Administered 2015-09-03 (×4): 100 ug via INTRAVENOUS

## 2015-09-03 MED ORDER — AMOXICILLIN-POT CLAVULANATE 875-125 MG PO TABS: 1.0000 | ORAL_TABLET | Freq: Every day | ORAL | Status: DC

## 2015-09-03 MED ORDER — SODIUM CHLORIDE 0.9% FLUSH: 3.0000 mL | INTRAVENOUS | Status: DC | PRN

## 2015-09-03 MED ORDER — FENTANYL CITRATE (PF) 100 MCG/2ML IJ SOLN
25.0000 ug | INTRAMUSCULAR | Status: DC | PRN
  Administered 2015-09-03: 50 ug via INTRAVENOUS
  Administered 2015-09-03: 25 ug via INTRAVENOUS

## 2015-09-03 MED ORDER — OXYCODONE-ACETAMINOPHEN 5-325 MG PO TABS
1.0000 | ORAL_TABLET | ORAL | Status: DC | PRN
  Administered 2015-09-03: 2 via ORAL

## 2015-09-03 MED ORDER — KETOROLAC TROMETHAMINE 30 MG/ML IJ SOLN: 30.0000 mg | Freq: Four times a day (QID) | INTRAMUSCULAR | Status: DC

## 2015-09-03 MED ORDER — CEFAZOLIN SODIUM 1 G IJ SOLR
INTRAMUSCULAR | Status: DC | PRN
  Administered 2015-09-03: 3 g via INTRAMUSCULAR

## 2015-09-03 MED ORDER — LACTATED RINGERS IV SOLN
INTRAVENOUS | Status: DC
  Administered 2015-09-03 (×2): via INTRAVENOUS

## 2015-09-03 MED ORDER — SODIUM CHLORIDE 0.9% FLUSH: 3.0000 mL | Freq: Two times a day (BID) | INTRAVENOUS | Status: DC

## 2015-09-03 MED ORDER — OXYCODONE-ACETAMINOPHEN 5-325 MG PO TABS: 1.0000 | ORAL_TABLET | Freq: Four times a day (QID) | ORAL | 0 refills | Status: DC | PRN

## 2015-09-03 MED ORDER — SUCCINYLCHOLINE CHLORIDE 200 MG/10ML IV SOSY
PREFILLED_SYRINGE | INTRAVENOUS | Status: AC
  Filled 2015-09-03: qty 10

## 2015-09-03 MED ORDER — FENTANYL CITRATE (PF) 100 MCG/2ML IJ SOLN
INTRAMUSCULAR | Status: AC
  Administered 2015-09-03: 50 ug via INTRAVENOUS
  Filled 2015-09-03: qty 2

## 2015-09-03 MED ORDER — ACETAMINOPHEN 325 MG PO TABS: 650.0000 mg | ORAL_TABLET | ORAL | Status: DC | PRN

## 2015-09-03 MED ORDER — ASPIRIN 325 MG PO TABS
325.0000 mg | ORAL_TABLET | Freq: Every day | ORAL | Status: DC
  Filled 2015-09-03: qty 1

## 2015-09-03 MED ORDER — OXYCODONE-ACETAMINOPHEN 5-325 MG PO TABS
ORAL_TABLET | ORAL | Status: AC
  Filled 2015-09-03: qty 2

## 2015-09-03 MED ORDER — MIDAZOLAM HCL 2 MG/2ML IJ SOLN
INTRAMUSCULAR | Status: DC | PRN
Start: 2015-09-03 — End: 2015-09-03
  Administered 2015-09-03: 2 mg via INTRAVENOUS

## 2015-09-03 MED ORDER — POTASSIUM CHLORIDE IN NACL 20-0.9 MEQ/L-% IV SOLN
INTRAVENOUS | Status: DC
  Filled 2015-09-03: qty 1000

## 2015-09-03 MED ORDER — ISOSORBIDE MONONITRATE ER 30 MG PO TB24
30.0000 mg | ORAL_TABLET | Freq: Every day | ORAL | Status: DC
  Filled 2015-09-03: qty 1

## 2015-09-03 MED ORDER — ROCURONIUM BROMIDE 10 MG/ML (PF) SYRINGE
PREFILLED_SYRINGE | INTRAVENOUS | Status: AC
  Filled 2015-09-03: qty 10

## 2015-09-03 MED ORDER — ROCURONIUM BROMIDE 100 MG/10ML IV SOLN
INTRAVENOUS | Status: DC | PRN
  Administered 2015-09-03: 10 mg via INTRAVENOUS
  Administered 2015-09-03: 20 mg via INTRAVENOUS
  Administered 2015-09-03: 30 mg via INTRAVENOUS
  Administered 2015-09-03: 10 mg via INTRAVENOUS

## 2015-09-03 MED ORDER — GELATIN ABSORBABLE 12-7 MM EX MISC
CUTANEOUS | Status: DC | PRN
  Administered 2015-09-03: 1

## 2015-09-03 MED ORDER — FENTANYL CITRATE (PF) 100 MCG/2ML IJ SOLN
INTRAMUSCULAR | Status: AC
Start: 2015-09-03 — End: 2015-09-03
  Filled 2015-09-03: qty 2

## 2015-09-03 MED ORDER — ISOSORBIDE MONONITRATE 15 MG HALF TABLET: 15.0000 mg | ORAL_TABLET | Freq: Every day | ORAL | Status: DC

## 2015-09-03 MED ORDER — HYDROCHLOROTHIAZIDE 12.5 MG PO CAPS: 12.5000 mg | ORAL_CAPSULE | Freq: Every day | ORAL | Status: DC

## 2015-09-03 MED ORDER — PHENOL 1.4 % MT LIQD
1.0000 | OROMUCOSAL | Status: DC | PRN
Start: 2015-09-03 — End: 2015-09-03

## 2015-09-03 MED ORDER — SUCCINYLCHOLINE CHLORIDE 200 MG/10ML IV SOSY
PREFILLED_SYRINGE | INTRAVENOUS | Status: DC | PRN
  Administered 2015-09-03: 140 mg via INTRAVENOUS

## 2015-09-03 MED ORDER — 0.9 % SODIUM CHLORIDE (POUR BTL) OPTIME
TOPICAL | Status: DC | PRN
  Administered 2015-09-03: 1000 mL

## 2015-09-03 MED ORDER — ONDANSETRON HCL 4 MG/2ML IJ SOLN
INTRAMUSCULAR | Status: DC | PRN
  Administered 2015-09-03: 4 mg via INTRAVENOUS

## 2015-09-03 MED ORDER — LIDOCAINE-EPINEPHRINE 0.5 %-1:200000 IJ SOLN
INTRAMUSCULAR | Status: DC | PRN
  Administered 2015-09-03: 10 mL

## 2015-09-03 MED ORDER — METOPROLOL SUCCINATE ER 25 MG PO TB24
25.0000 mg | ORAL_TABLET | Freq: Every day | ORAL | Status: DC
  Administered 2015-09-03: 25 mg via ORAL
  Filled 2015-09-03: qty 1

## 2015-09-03 MED ORDER — ZOLPIDEM TARTRATE 5 MG PO TABS: 5.0000 mg | ORAL_TABLET | Freq: Every evening | ORAL | Status: DC | PRN

## 2015-09-03 MED ORDER — HYDROMORPHONE HCL 1 MG/ML IJ SOLN: 0.5000 mg | INTRAMUSCULAR | Status: DC | PRN

## 2015-09-03 MED ORDER — SUGAMMADEX SODIUM 500 MG/5ML IV SOLN
INTRAVENOUS | Status: AC
  Filled 2015-09-03: qty 5

## 2015-09-03 MED ORDER — PROPOFOL 10 MG/ML IV BOLUS
INTRAVENOUS | Status: AC
  Filled 2015-09-03: qty 20

## 2015-09-03 MED ORDER — ONDANSETRON HCL 4 MG/2ML IJ SOLN: 4.0000 mg | INTRAMUSCULAR | Status: DC | PRN

## 2015-09-03 MED ORDER — HYDROCODONE-ACETAMINOPHEN 5-325 MG PO TABS: 1.0000 | ORAL_TABLET | ORAL | Status: DC | PRN

## 2015-09-03 MED ORDER — CEFAZOLIN SODIUM 1 G IJ SOLR
INTRAMUSCULAR | Status: AC
  Filled 2015-09-03: qty 20

## 2015-09-03 MED ORDER — DIAZEPAM 5 MG PO TABS: 5.0000 mg | ORAL_TABLET | Freq: Four times a day (QID) | ORAL | Status: DC | PRN

## 2015-09-03 MED ORDER — CEFAZOLIN SODIUM 1 G IJ SOLR
INTRAMUSCULAR | Status: AC
  Filled 2015-09-03: qty 10

## 2015-09-03 MED ORDER — ACETAMINOPHEN 650 MG RE SUPP: 650.0000 mg | RECTAL | Status: DC | PRN

## 2015-09-03 MED ORDER — ROSUVASTATIN CALCIUM 20 MG PO TABS: 20.0000 mg | ORAL_TABLET | Freq: Every day | ORAL | Status: DC

## 2015-09-03 MED ORDER — LIDOCAINE 2% (20 MG/ML) 5 ML SYRINGE
INTRAMUSCULAR | Status: DC | PRN
  Administered 2015-09-03: 100 mg via INTRAVENOUS

## 2015-09-03 MED ORDER — PANTOPRAZOLE SODIUM 40 MG PO TBEC: 40.0000 mg | DELAYED_RELEASE_TABLET | Freq: Every day | ORAL | Status: DC

## 2015-09-03 SURGICAL SUPPLY — 47 items
BAG DECANTER FOR FLEXI CONT (MISCELLANEOUS) ×3 IMPLANT
BENZOIN TINCTURE PRP APPL 2/3 (GAUZE/BANDAGES/DRESSINGS) IMPLANT
BLADE CLIPPER SURG (BLADE) ×3 IMPLANT
BUR MATCHSTICK NEURO 3.0 LAGG (BURR) ×3 IMPLANT
CANISTER SUCT 3000ML PPV (MISCELLANEOUS) ×3 IMPLANT
CLOSURE WOUND 1/2 X4 (GAUZE/BANDAGES/DRESSINGS)
DECANTER SPIKE VIAL GLASS SM (MISCELLANEOUS) ×3 IMPLANT
DERMABOND ADVANCED (GAUZE/BANDAGES/DRESSINGS) ×2
DERMABOND ADVANCED .7 DNX12 (GAUZE/BANDAGES/DRESSINGS) ×1 IMPLANT
DRAPE LAPAROTOMY 100X72X124 (DRAPES) ×3 IMPLANT
DRAPE MICROSCOPE LEICA (MISCELLANEOUS) ×3 IMPLANT
DRAPE POUCH INSTRU U-SHP 10X18 (DRAPES) ×3 IMPLANT
DRAPE SURG 17X23 STRL (DRAPES) ×3 IMPLANT
DURAPREP 26ML APPLICATOR (WOUND CARE) ×3 IMPLANT
ELECT REM PT RETURN 9FT ADLT (ELECTROSURGICAL) ×3
ELECTRODE REM PT RTRN 9FT ADLT (ELECTROSURGICAL) ×1 IMPLANT
GAUZE SPONGE 4X4 12PLY STRL (GAUZE/BANDAGES/DRESSINGS) IMPLANT
GAUZE SPONGE 4X4 16PLY XRAY LF (GAUZE/BANDAGES/DRESSINGS) IMPLANT
GLOVE ECLIPSE 6.5 STRL STRAW (GLOVE) ×3 IMPLANT
GLOVE EXAM NITRILE LRG STRL (GLOVE) IMPLANT
GLOVE EXAM NITRILE MD LF STRL (GLOVE) IMPLANT
GLOVE EXAM NITRILE XL STR (GLOVE) IMPLANT
GLOVE EXAM NITRILE XS STR PU (GLOVE) IMPLANT
GOWN STRL REUS W/ TWL LRG LVL3 (GOWN DISPOSABLE) ×2 IMPLANT
GOWN STRL REUS W/ TWL XL LVL3 (GOWN DISPOSABLE) IMPLANT
GOWN STRL REUS W/TWL 2XL LVL3 (GOWN DISPOSABLE) IMPLANT
GOWN STRL REUS W/TWL LRG LVL3 (GOWN DISPOSABLE) ×4
GOWN STRL REUS W/TWL XL LVL3 (GOWN DISPOSABLE)
KIT BASIN OR (CUSTOM PROCEDURE TRAY) ×3 IMPLANT
KIT ROOM TURNOVER OR (KITS) ×3 IMPLANT
LIQUID BAND (GAUZE/BANDAGES/DRESSINGS) ×3 IMPLANT
NEEDLE HYPO 25X1 1.5 SAFETY (NEEDLE) ×3 IMPLANT
NEEDLE SPNL 18GX3.5 QUINCKE PK (NEEDLE) IMPLANT
NS IRRIG 1000ML POUR BTL (IV SOLUTION) ×3 IMPLANT
PACK LAMINECTOMY NEURO (CUSTOM PROCEDURE TRAY) ×3 IMPLANT
PAD ARMBOARD 7.5X6 YLW CONV (MISCELLANEOUS) ×9 IMPLANT
RUBBERBAND STERILE (MISCELLANEOUS) ×6 IMPLANT
SPONGE LAP 4X18 X RAY DECT (DISPOSABLE) IMPLANT
SPONGE SURGIFOAM ABS GEL SZ50 (HEMOSTASIS) ×3 IMPLANT
STRIP CLOSURE SKIN 1/2X4 (GAUZE/BANDAGES/DRESSINGS) IMPLANT
SUT VIC AB 0 CT1 18XCR BRD8 (SUTURE) ×1 IMPLANT
SUT VIC AB 0 CT1 8-18 (SUTURE) ×2
SUT VIC AB 2-0 CT1 18 (SUTURE) ×3 IMPLANT
SUT VIC AB 3-0 SH 8-18 (SUTURE) ×3 IMPLANT
TOWEL OR 17X24 6PK STRL BLUE (TOWEL DISPOSABLE) ×3 IMPLANT
TOWEL OR 17X26 10 PK STRL BLUE (TOWEL DISPOSABLE) ×3 IMPLANT
WATER STERILE IRR 1000ML POUR (IV SOLUTION) ×3 IMPLANT

## 2015-09-03 NOTE — Anesthesia Preprocedure Evaluation (Addendum)
Anesthesia Evaluation  Patient identified by MRN, date of birth, ID band Patient awake    Reviewed: Allergy & Precautions, NPO status , Patient's Chart, lab work & pertinent test results, reviewed documented beta blocker date and time   History of Anesthesia Complications Negative for: history of anesthetic complications  Airway Mallampati: III  TM Distance: >3 FB Neck ROM: Full    Dental  (+) Teeth Intact, Dental Advisory Given   Pulmonary neg pulmonary ROS,    Pulmonary exam normal breath sounds clear to auscultation       Cardiovascular hypertension, Pt. on home beta blockers and Pt. on medications (-) angina+ CAD and + Cardiac Stents  (-) Past MI Normal cardiovascular exam Rhythm:Regular Rate:Normal  signed cardiac clearance note by Dr. Wynonia Lawman for this procedure (dated 08/28/15; placed in hard chart).  02/09/14 LHC: Coronary angiography: Coronary dominance:right - Left mainstem:Normal - Left anterior descending (LAD):The LAD has mild diffuse calcification in the proximal vessel. There are mild irregularities less than 20%. The first diagonal has a stent in place and it is patent. The second diagonal is a bifurcating vessel with 20% ostial narrowing. - Left circumflex (LCx):The LCx gives rise to 3 OM branches. It is normal. - Right coronary artery (RCA):There is moderate calcification in the proximal RCA with focal 30% stenosis.  - Left ventriculography: Left ventricular systolic function is normal, LVEF is estimated at 55-65%, there is no significant mitral regurgitation. The proximal aortic root appears enlarged.  Final Conclusions:  1. Nonobstructive CAD, patent stent in the first diagonal. 2. Normal LV function. Recommendations:continue medical therapy. Consider CT to assess Aortic root.   08/05/13 Echo: Study Conclusions - Left ventricle: The cavity size was normal. Systolic function was normal. The estimated  ejection fraction was in the range of 55% to 60%. Wall motion was normal; there were no regional wall motion abnormalities. Features are consistent with a pseudonormal left ventricular filling pattern, with concomitant abnormalrelaxation and increased filling pressure (grade 2 diastolicdysfunction). - Aortic root: The aortic root was moderately dilated. - Left atrium: The atrium was mildly dilated. 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   Neuro/Psych LLE numbness negative psych ROS   GI/Hepatic Neg liver ROS, GERD  Medicated,  Endo/Other  Morbid obesity  Renal/GU negative Renal ROS     Musculoskeletal  (+) Arthritis , Osteoarthritis,    Abdominal   Peds  Hematology  (+) Blood dyscrasia (Plavix), ,   Anesthesia Other Findings Day of surgery medications reviewed with the patient.  Reproductive/Obstetrics                            Anesthesia Physical Anesthesia Plan  ASA: III  Anesthesia Plan: General   Post-op Pain Management:    Induction: Intravenous  Airway Management Planned: Oral ETT and Video Laryngoscope Planned  Additional Equipment:   Intra-op Plan:   Post-operative Plan: Extubation in OR  Informed Consent: I have reviewed the patients History and Physical, chart, labs and discussed the procedure including the risks, benefits and alternatives for the proposed anesthesia with the patient or authorized representative who has indicated his/her understanding and acceptance.   Dental advisory given  Plan Discussed with: CRNA  Anesthesia Plan Comments: (Risks/benefits of general anesthesia discussed with patient including risk of damage to teeth, lips, gum, and tongue, nausea/vomiting, allergic reactions to medications, and the possibility  of heart attack, stroke and death.  All patient questions  answered.  Patient wishes to proceed.)        Anesthesia Quick Evaluation

## 2015-09-03 NOTE — Anesthesia Procedure Notes (Signed)
Procedure Name: Intubation Date/Time: 09/03/2015 9:44 AM Performed by: Trixie Deis A Pre-anesthesia Checklist: Patient identified, Emergency Drugs available, Suction available, Patient being monitored and Timeout performed Patient Re-evaluated:Patient Re-evaluated prior to inductionOxygen Delivery Method: Circle system utilized Preoxygenation: Pre-oxygenation with 100% oxygen Intubation Type: IV induction Ventilation: Mask ventilation without difficulty and Oral airway inserted - appropriate to patient size Laryngoscope Size: Glidescope and 4 Grade View: Grade I Tube type: Oral Tube size: 7.5 mm Number of attempts: 1 Airway Equipment and Method: Stylet Placement Confirmation: positive ETCO2,  breath sounds checked- equal and bilateral and ETT inserted through vocal cords under direct vision Secured at: 23 cm Tube secured with: Tape Dental Injury: Teeth and Oropharynx as per pre-operative assessment

## 2015-09-03 NOTE — Anesthesia Postprocedure Evaluation (Signed)
Anesthesia Post Note  Patient: Stephen Medina  Procedure(s) Performed: Procedure(s) (LRB): MICRODISCECTOMY L5-S1 LEFT (Left)  Patient location during evaluation: PACU Anesthesia Type: General Level of consciousness: awake and alert Pain management: pain level controlled Vital Signs Assessment: post-procedure vital signs reviewed and stable Respiratory status: spontaneous breathing, nonlabored ventilation, respiratory function stable and patient connected to nasal cannula oxygen Cardiovascular status: blood pressure returned to baseline and stable Postop Assessment: no signs of nausea or vomiting Anesthetic complications: no    Last Vitals:  Vitals:   09/03/15 1225 09/03/15 1240  BP: 104/61 (!) 113/53  Pulse: 61 60  Resp: 13 18  Temp:      Last Pain:  Vitals:   09/03/15 1230  TempSrc:   PainSc: 5                  Catalina Gravel

## 2015-09-03 NOTE — Transfer of Care (Signed)
Immediate Anesthesia Transfer of Care Note  Patient: Stephen Medina  Procedure(s) Performed: Procedure(s) with comments: MICRODISCECTOMY L5-S1 LEFT (Left) -  MICRODISCECTOMY L5-S1 LEFT  Patient Location: PACU  Anesthesia Type:General  Level of Consciousness: awake, alert  and oriented  Airway & Oxygen Therapy: Patient Spontanous Breathing and Patient connected to nasal cannula oxygen  Post-op Assessment: Report given to RN, Post -op Vital signs reviewed and stable and Patient moving all extremities  Post vital signs: Reviewed and stable  Last Vitals:  Vitals:   09/03/15 0712  BP: 136/75  Pulse: 61  Resp: 18  Temp: 36.6 C    Last Pain:  Vitals:   09/03/15 0712  TempSrc: Oral      Patients Stated Pain Goal: 1 ([id number])  Complications: No apparent anesthesia complications

## 2015-09-03 NOTE — Progress Notes (Signed)
Patient ID: Stephen Medina, male   DOB: 1950/10/24, 65 y.o.   MRN: [id number]  Subjective: MICAJAH OAS is a 65 y.o. is seen today in office today for continued care of ulceration to his right foot. He states he has a callus under buildup over the area and his plain Vaseline on the wound. He states that the wound is almost completely healed until callus formed inserted open back up he still feels that the wound is much improved compared to what it was. Denies a drainage or pus or any surrounding redness or red streaks. No pain.  Denies any systemic complaints such as fevers, chills, nausea, vomiting. No calf pain, chest pain, shortness of breath.   Objective: General: No acute distress, AAOx3; presents walking cam boot. DP/PT pulses palpable 2/4, CRT < 3 sec to all digits.  Protective sensation decreased. Motor function intact.  Left foot  ulceration today measures about 0.8 x 0.8. A superficial granular wound base. Hyperkerotic periwound. There is no probing, undermining or tunneling. No swelling erythema, ascending cellulitis, fluctuance, crepitus, malodor. No other open lesions or pre-ulcer lesions identified bilaterally. No pain with calf compression, swelling, warmth, erythema.   Assessment and Plan:  Status post right foot wound debridement; wound has improved.   -Treatment options discussed including all alternatives, risks, and complications -Wound was sharply debrided to a healthy, granular wound base. Due to radiation changes as discussed. He is a small month in about ointment and a day between the area uncovered at night help dry. -Offloading. -Continue cam boot. -Limit weightbearing -Elevation -Follow-up in 2 weeks or sooner if needed.  -Monitor for signs or symptoms of infection to call the office should any occur or go to the ER. Encouraged to call any questions or concerns or any changes in the meantime.  Celesta Gentile, DPM

## 2015-09-03 NOTE — Discharge Instructions (Signed)
Lumbar Discectomy °Care After °A discectomy involves removal of discmaterial (the cartilage-like structures located between the bones of the back). It is done to relieve pressure on nerve roots. It can be used as a treatment for a back problem. The time in surgery depends on the findings in surgery and what is necessary to correct the problems. °HOME CARE INSTRUCTIONS  °· Check the cut (incision) made by the surgeon twice a day for signs of infection. Some signs of infection may include:  °· A foul smelling, greenish or yellowish discharge from the wound.  °· Increased pain.  °· Increased redness over the incision (operative) site.  °· The skin edges may separate.  °· Flu-like symptoms (problems).  °· A temperature above 101.5° F (38.6° C).  °· Change your bandages in about 24 to 36 hours following surgery or as directed.  °· You may shower tomrrow.  Avoid bathtubs, swimming pools and hot tubs for three weeks or until your incision has healed completely. °· Follow your doctor's instructions as to safe activities, exercises, and physical therapy.  °· Weight reduction may be beneficial if you are overweight.  °· Daily exercise is helpful to prevent the return of problems. Walking is permitted. You may use a treadmill without an incline. Cut down on activities and exercise if you have discomfort. You may also go up and down stairs as much as you can tolerate.  °· DO NOT lift anything heavier than 10 to 15 lbs. Avoid bending or twisting at the waist. Always bend your knees when lifting.  °· Maintain strength and range of motion as instructed.  °· Do not drive for 10 days, or as directed by your doctors. You may be a passenger . Lying back in the passenger seat may be more comfortable for you. Always wear a seatbelt.  °· Limit your sitting in a regular chair to 20 to 30 minutes at a time. There are no limitations for sitting in a recliner. You should lie down or walk in between sitting periods.  °· Only take  over-the-counter or prescription medicines for pain, discomfort, or fever as directed by your caregiver.  °SEEK MEDICAL CARE IF:  °· There is increased bleeding (more than a small spot) from the wound.  °· You notice redness, swelling, or increasing pain in the wound.  °· Pus is coming from wound.  °· You develop an unexplained oral temperature above 102° F (38.9° C) develops.  °· You notice a foul smell coming from the wound or dressing.  °· You have increasing pain in your wound.  °SEEK IMMEDIATE MEDICAL CARE IF:  °· You develop a rash.  °· You have difficulty breathing.  °· You develop any allergic problems to medicines given.  °Document Released: 12/05/2003 Document Revised: 12/19/2010 Document Reviewed: 03/25/2007 °ExitCare® Patient Information ° ° ° °Wound Care °Leave incision open to air. °You may shower. °Do not scrub directly on incision.  °Do not put any creams, lotions, or ointments on incision. °Activity °Walk each and every day, increasing distance each day. °No lifting greater than 5 lbs.  Avoid bending, arching, and twisting. °No driving for 2 weeks; may ride as a passenger locally. °If provided with back brace, wear when out of bed.  It is not necessary to wear in bed. °Diet °Resume your normal diet.  °Return to Work °Will be discussed at you follow up appointment. °Call Your Doctor If Any of These Occur °Redness, drainage, or swelling at the wound.  °Temperature greater than   101 degrees. °Severe pain not relieved by pain medication. °Incision starts to come apart. °Follow Up Appt °Call today for appointment in 4 weeks ([phone]) or for problems.  If you have any hardware placed in your spine, you will need an x-ray before your appointment. °

## 2015-09-03 NOTE — Progress Notes (Signed)
Patient alert and oriented, mae's well, voiding adequate amount of urine, swallowing without difficulty, Patient discharged home with family. Script and discharged instructions given to patient. Patient and family stated understanding of instructions given.

## 2015-09-03 NOTE — H&P (Signed)
Stephen Medina is an 65 y.o. male.   Chief Complaint: pain in the left lower extremity HPI: Stephen Medina has had severe pain in the left lower extremity. MRI revealed hnp at L5/S1. No bowel/bladder difficulties.   Past Medical History:  Diagnosis Date  . CAD (coronary artery disease) 08/01/2013   Cath 2005  normal Left main, scattered irregularities LAD, 80% stenosis ostial Diag 1, scattered irregularities CFX, scattered irregularities RCA  Taxus stent March 2005 Diagonal   . Disc displacement, lumbar   . Foot ulceration ([place])    left  . GERD (gastroesophageal reflux disease)   . History of ulcer disease 10/19/2013  . Hyperlipidemia   . Hypertension   . Lumbar spondylosis 04/01/2013  . Morbid obesity ([place])   . Wears glasses     Past Surgical History:  Procedure Laterality Date  . APPENDECTOMY    . BACK SURGERY    . CARDIAC CATHETERIZATION     05    1 stent   dr Wynonia Lawman  . ESOPHAGOGASTRODUODENOSCOPY N/A 08/02/2013   Procedure: ESOPHAGOGASTRODUODENOSCOPY (EGD);  Surgeon: Ladene Artist, MD;  Location: [facility] ENDOSCOPY;  Service: Endoscopy;  Laterality: N/A;  . FOOT SURGERY    . GRAFT APPLICATION N/A [id number]   Procedure: INTEGRA GRAFT APPLICATION;  Surgeon: Trula Slade, DPM;  Location: [facility];  Service: Podiatry;  Laterality: N/A;  . LEFT HEART CATHETERIZATION WITH CORONARY ANGIOGRAM N/A 02/09/2014   Procedure: LEFT HEART CATHETERIZATION WITH CORONARY ANGIOGRAM;  Surgeon: Peter M Martinique, MD;  Location: [facility] CATH LAB;  Service: Cardiovascular;  Laterality: N/A;  . LUMBAR LAMINECTOMY     x2  . TONSILLECTOMY    . WOUND DEBRIDEMENT N/A 06/22/2015   Procedure: DEBRIDEMENT WOUND;  Surgeon: Trula Slade, DPM;  Location: [facility];  Service: Podiatry;  Laterality: N/A;    Family History  Problem Relation Age of Onset  . Coronary artery disease Father     open heart surgery   . Cancer Brother   . CAD Cousin     requiring stent  . Colon cancer Neg Hx    . Pancreatic cancer Neg Hx   . Rectal cancer Neg Hx   . Stomach cancer Neg Hx    Social History:  reports that he has never smoked. He has quit using smokeless tobacco. His smokeless tobacco use included Chew. He reports that he does not drink alcohol or use drugs.  Allergies:  Allergies  Allergen Reactions  . Adhesive [Tape] Other (See Comments)    Blisters.  Paper tape ok.    Medications Prior to Admission  Medication Sig Dispense Refill  . amoxicillin-clavulanate (AUGMENTIN) 875-125 MG tablet Take 1 tablet by mouth 2 (two) times daily. (Patient taking differently: Take 1 tablet by mouth daily. ) 20 tablet 0  . aspirin 325 MG tablet Take 325 mg by mouth daily.     . clopidogrel (PLAVIX) 75 MG tablet Take 75 mg by mouth daily.    . isosorbide mononitrate (IMDUR) 30 MG 24 hr tablet Take 30 mg by mouth daily.    Marland Kitchen losartan-hydrochlorothiazide (HYZAAR) 100-12.5 MG tablet Take 1 tablet by mouth daily.  1  . metoprolol succinate (TOPROL-XL) 25 MG 24 hr tablet Take 25 mg by mouth daily.   3  . oxyCODONE-acetaminophen (PERCOCET) 7.5-325 MG tablet Take 1 tablet by mouth every 6 (six) hours as needed for pain.  0  . rosuvastatin (CRESTOR) 40 MG tablet Take 20 mg by mouth daily.     Marland Kitchen  HYDROcodone-acetaminophen (NORCO) 10-325 MG tablet Take 1 tablet by mouth every 6 (six) hours as needed. (Patient not taking: Reported on 08/29/2015) 30 tablet 0  . isosorbide mononitrate (IMDUR) 15 mg TB24 24 hr tablet Take 0.5 tablets (15 mg total) by mouth daily. (Patient not taking: Reported on 08/29/2015) 30 tablet 2  . pantoprazole (PROTONIX) 40 MG tablet TAKE 1 TABLET BY MOUTH EVERY DAY (Patient not taking: Reported on 08/29/2015) 90 tablet 0    No results found for this or any previous visit (from the past 48 hour(s)). No results found.  Review of Systems  Constitutional: Negative.   HENT: Negative.   Eyes: Negative.   Respiratory: Negative.   Cardiovascular: Negative.   Gastrointestinal: Negative.    Genitourinary: Negative.   Musculoskeletal: Positive for back pain.  Skin: Negative.   Neurological: Positive for focal weakness.  Endo/Heme/Allergies: Negative.   Psychiatric/Behavioral: Negative.     Blood pressure 136/75, pulse 61, temperature 97.9 F (36.6 C), temperature source Oral, resp. rate 18, weight (!) 147.4 kg (325 lb), SpO2 96 %. Physical Exam  Constitutional: He is oriented to person, place, and time. He appears well-developed and well-nourished. He appears distressed.  HENT:  Head: Normocephalic and atraumatic.  Right Ear: External ear normal.  Left Ear: External ear normal.  Eyes: Conjunctivae and EOM are normal. Pupils are equal, round, and reactive to light.  Neck: Normal range of motion. Neck supple.  Cardiovascular: Normal rate, regular rhythm, normal heart sounds and intact distal pulses.   Respiratory: Effort normal and breath sounds normal.  GI: Soft. Bowel sounds are normal.  Musculoskeletal: Normal range of motion.  Neurological: He is alert and oriented to person, place, and time. He displays abnormal reflex. A sensory deficit is present. No cranial nerve deficit. He displays a negative Romberg sign. Gait abnormal. Coordination normal.  Weakness in left gastrocnemius   Skin: He is not diaphoretic.     Assessment/Plan HNP L5/S1 for OR given weakness and severe pain in the right lower extremity.risks and benefits are well known since he has undergone this procedure in the past. He would like to proceed.   Stephen Medina L, MD 09/03/2015, 9:34 AM

## 2015-09-03 NOTE — Op Note (Signed)
09/03/2015  11:58 AM  PATIENT:  Stephen Medina  65 y.o. male  PRE-OPERATIVE DIAGNOSIS:  DISC DISPLACEMENT, LUMBAR 5/1, left  POST-OPERATIVE DIAGNOSIS:  Disc displacement, Lumbar L5/S1 left  PROCEDURE:  Procedure(s): MICRODISCECTOMY L5-S1 LEFT  SURGEON:   Surgeon(s): Ashok Pall, MD  ASSISTANTS:Ditty, benjamin  ANESTHESIA:   general  EBL:  Total I/O In: 600 [I.V.:600] Out: 50 [Blood:50]  BLOOD ADMINISTERED:none  CELL SAVER GIVEN:none  COUNT:per nursing  DRAINS: none   SPECIMEN:  No Specimen  DICTATION: [name] was taken to the operating room, intubated and placed under a general anesthetic without difficulty. He was positioned prone on a Wilson frame with all pressure points padded. His back was prepped and draped in a sterile manner. I opened the skin with a 10 blade and carried the dissection down to the thoracolumbar fascia. I used both sharp dissection and the monopolar cautery to expose the lamina of L5, and S1. I confirmed my location with an intraoperative xray.   I used the punches to remove the ligamentum flavum to expose the thecal sac. I brought the microscope into the operative field and with Dr.Ditty's assistance we started our decompression of the spinal canal, thecal sac and S1 root(s). I cauterized epidural veins overlying the disc space then divided them sharply. I opened the disc space with a 15 blade and proceeded with the discectomy. I used pituitary rongeurs, curettes, and other instruments to remove disc material. After the discectomy was completed we inspected the S1 nerve root and felt it was well decompressed. I explored rostrally, laterally, medially, and caudally and was satisfied with the decompression. I irrigated the wound, then closed in layers. I approximated the thoracolumbar fascia, subcutaneous, and subcuticular planes with vicryl sutures. I used dermabond for a sterile dressing.   PLAN OF CARE: Admit for overnight observation  PATIENT  DISPOSITION:  PACU - hemodynamically stable.   Delay start of Pharmacological VTE agent (>24hrs) due to surgical blood loss or risk of bleeding:  yes

## 2015-09-03 NOTE — Discharge Summary (Signed)
  Physician Discharge Summary  Patient ID: Stephen Medina MRN: [id number] DOB/AGE: 63-Sep-1952 63 y.o.  Admit date: 09/03/2015 Discharge date: 09/03/2015  Admission Diagnoses:HNP Left L5/S1  Discharge Diagnoses: same Active Problems:   HNP (herniated nucleus pulposus), lumbar   Discharged Condition: good  Hospital Course: Mr. Oskey was taken to the operating room and underwent an uncomplicated lumbar laminectomy and discetomy at L5/S1 on the left side. Post op he is voiding, ambulating, and tolerating a regular diet. His wound is clean, dry, and without signs of infection at discharge.   Treatments: surgery: Left L5/S1 laminectomy and discetomy   Discharge Exam: Blood pressure (!) 99/54, pulse 61, temperature 97.8 F (36.6 C), resp. rate 13, weight (!) 147.4 kg (325 lb), SpO2 100 %. General appearance: alert, cooperative, appears stated age and mild distress Neurologic: Alert and oriented X 3, normal strength and tone. Normal symmetric reflexes. Normal coordination and gait  Disposition: 01-Home or Self Care DISC DISPLACEMENT, LUMBAR    Medication List    TAKE these medications   amoxicillin-clavulanate 875-125 MG tablet Commonly known as:  AUGMENTIN Take 1 tablet by mouth 2 (two) times daily. What changed:  when to take this   aspirin 325 MG tablet Take 325 mg by mouth daily.   clopidogrel 75 MG tablet Commonly known as:  PLAVIX Take 75 mg by mouth daily.   HYDROcodone-acetaminophen 10-325 MG tablet Commonly known as:  NORCO Take 1 tablet by mouth every 6 (six) hours as needed.   isosorbide mononitrate 30 MG 24 hr tablet Commonly known as:  IMDUR Take 30 mg by mouth daily.   isosorbide mononitrate 15 mg Tb24 24 hr tablet Commonly known as:  IMDUR Take 0.5 tablets (15 mg total) by mouth daily.   losartan-hydrochlorothiazide 100-12.5 MG tablet Commonly known as:  HYZAAR Take 1 tablet by mouth daily.   metoprolol succinate 25 MG 24 hr tablet Commonly known as:   TOPROL-XL Take 25 mg by mouth daily.   oxyCODONE-acetaminophen 7.5-325 MG tablet Commonly known as:  PERCOCET Take 1 tablet by mouth every 6 (six) hours as needed for pain. What changed:  Another medication with the same name was added. Make sure you understand how and when to take each.   oxyCODONE-acetaminophen 5-325 MG tablet Commonly known as:  ROXICET Take 1-2 tablets by mouth every 6 (six) hours as needed for severe pain. What changed:  You were already taking a medication with the same name, and this prescription was added. Make sure you understand how and when to take each.   pantoprazole 40 MG tablet Commonly known as:  PROTONIX TAKE 1 TABLET BY MOUTH EVERY DAY   rosuvastatin 40 MG tablet Commonly known as:  CRESTOR Take 20 mg by mouth daily.      Follow-up Information    Aleksandra Raben L, MD. Schedule an appointment as soon as possible for a visit in 3 week(s).   Specialty:  Neurosurgery Contact information: [address] [place] [address] [phone]           Signed: Winfield Cunas 09/03/2015, 12:29 PM

## 2015-09-04 ENCOUNTER — Encounter (HOSPITAL_COMMUNITY): Payer: Self-pay | Admitting: Neurosurgery

## 2015-09-14 ENCOUNTER — Encounter: Payer: Self-pay | Admitting: Podiatry

## 2015-09-14 ENCOUNTER — Ambulatory Visit (INDEPENDENT_AMBULATORY_CARE_PROVIDER_SITE_OTHER): Payer: Medicare Other | Admitting: Podiatry

## 2015-09-14 DIAGNOSIS — L97522 Non-pressure chronic ulcer of other part of left foot with fat layer exposed: Secondary | ICD-10-CM

## 2015-09-14 DIAGNOSIS — L97929 Non-pressure chronic ulcer of unspecified part of left lower leg with unspecified severity: Secondary | ICD-10-CM

## 2015-09-16 IMAGING — CT CT CHEST W/O CM
1 of 4 series · 15 of 30 positions shown, 19 images · non-contrast
Comparison: None.

CLINICAL DATA: Initial encounter for the thoracic aortic aneurysm
seen on cardiac catheterization.

EXAM:
CT CHEST WITHOUT CONTRAST
TECHNIQUE: Multidetector CT imaging of the chest was performed following the
standard protocol without IV contrast..

[Series 2: thin chest · axial · 0.74mm/px · z∈[-285,-7]mm · 15 of 318 slices shown, 19 images]
[im 20/318  mediastinal]
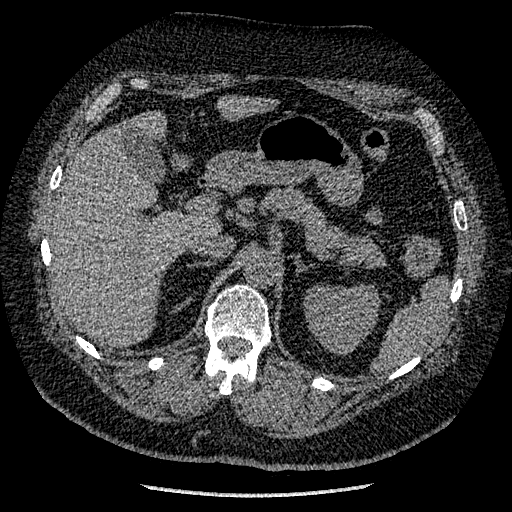
[im 20/318  lung]
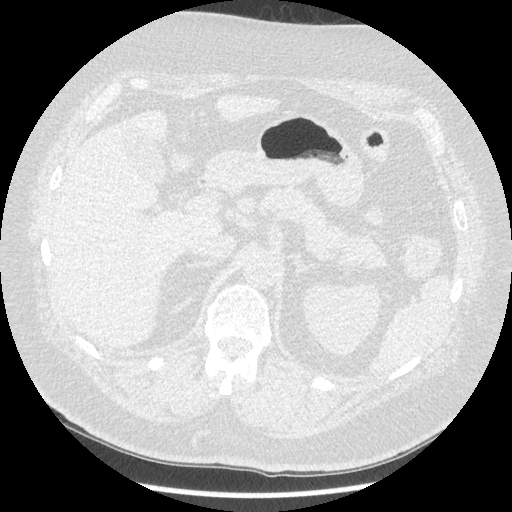
[im 40/318  lung]
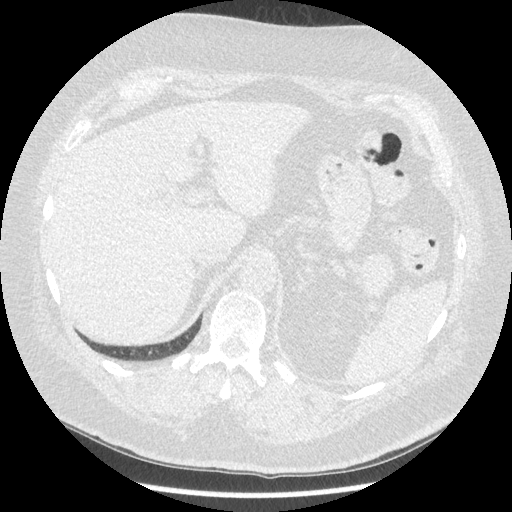
[im 60/318  lung]
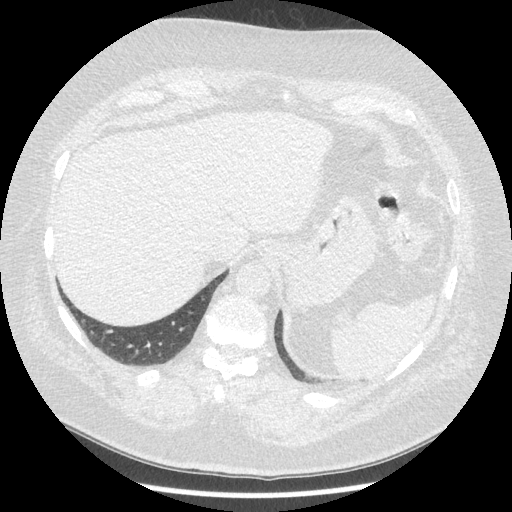
[im 80/318  lung]
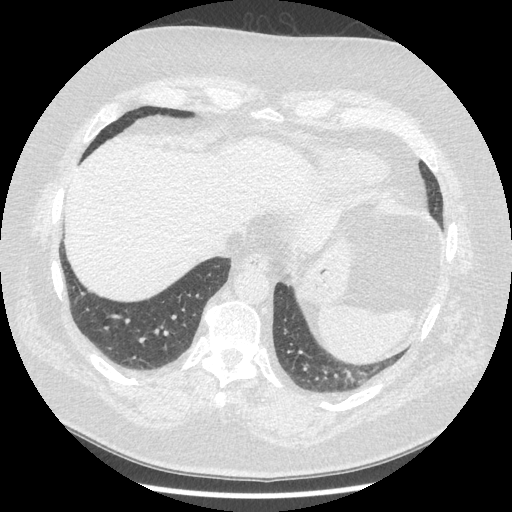
[im 100/318  mediastinal]
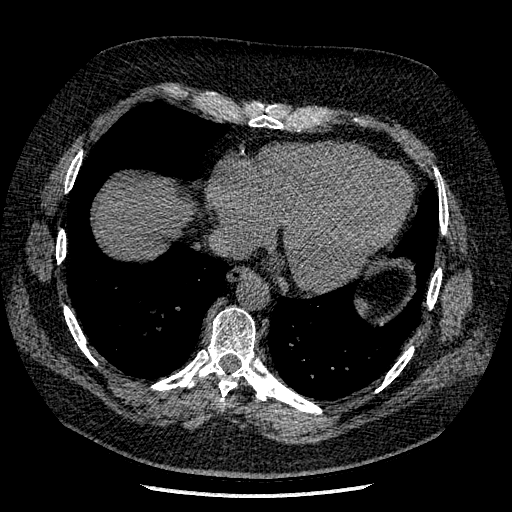
[im 100/318  lung]
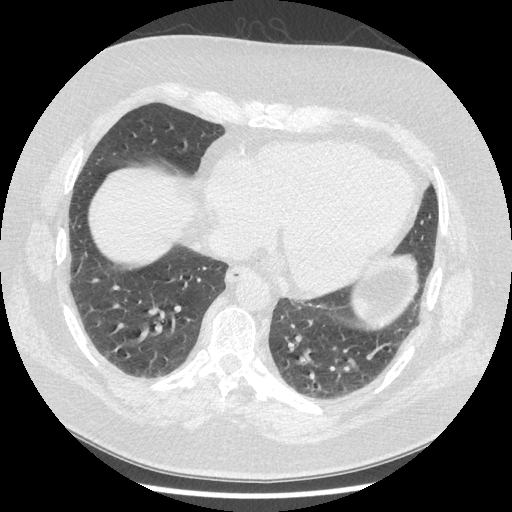
[im 119/318  lung]
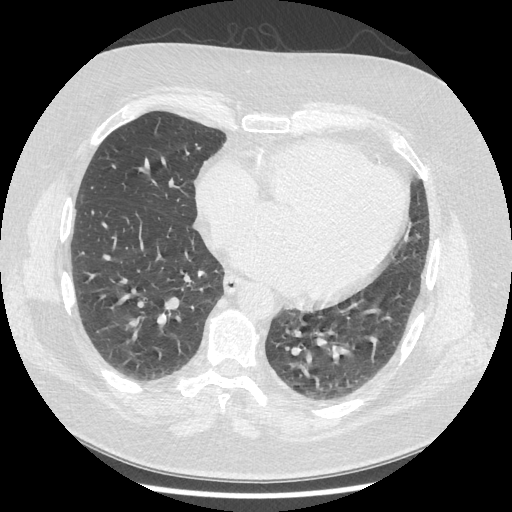
[im 139/318  lung]
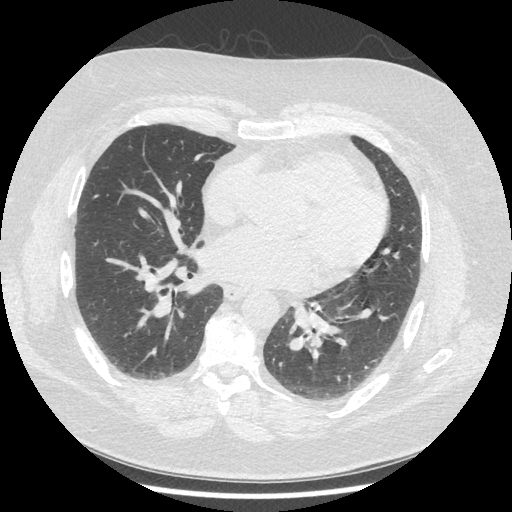
[im 159/318  lung]
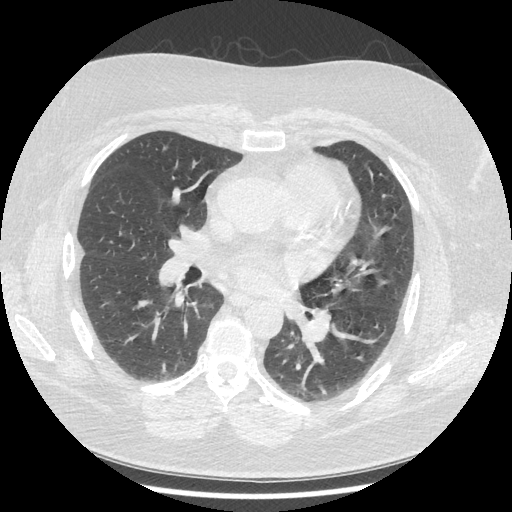
[im 179/318  mediastinal]
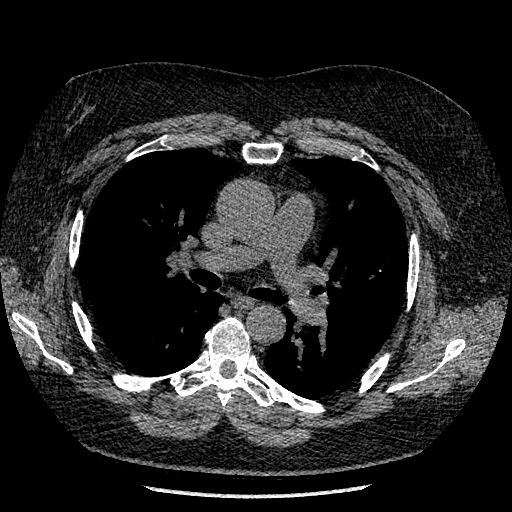
[im 179/318  lung]
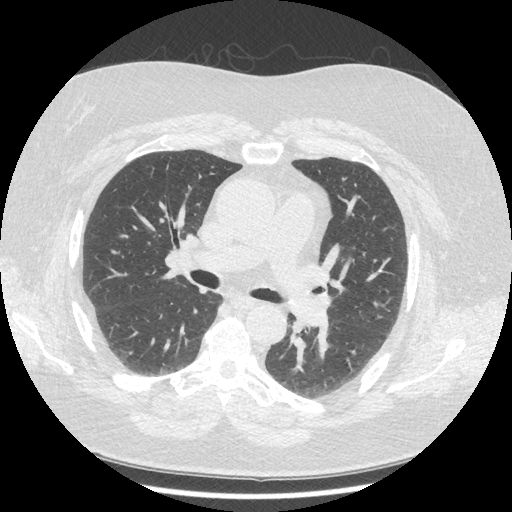
[im 199/318  lung]
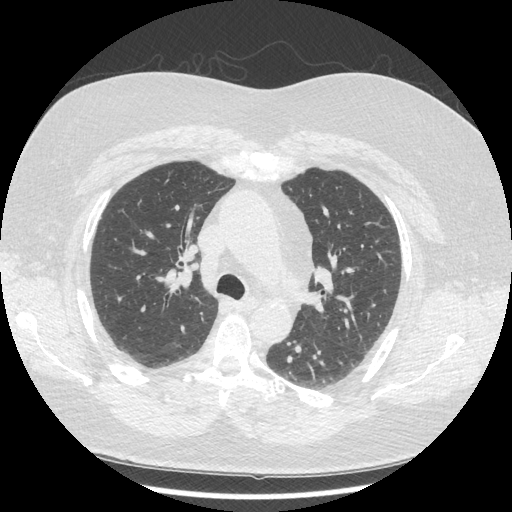
[im 218/318  lung]
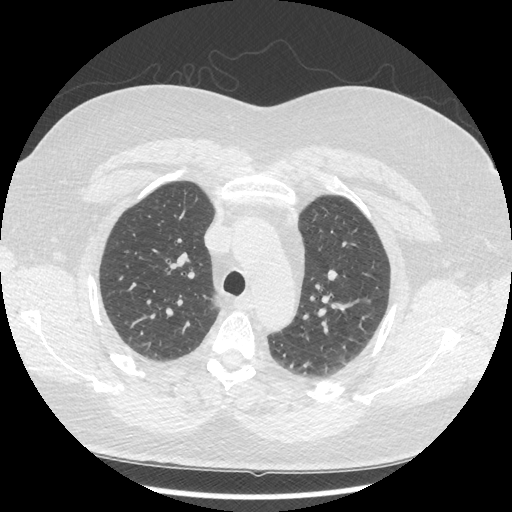
[im 238/318  lung]
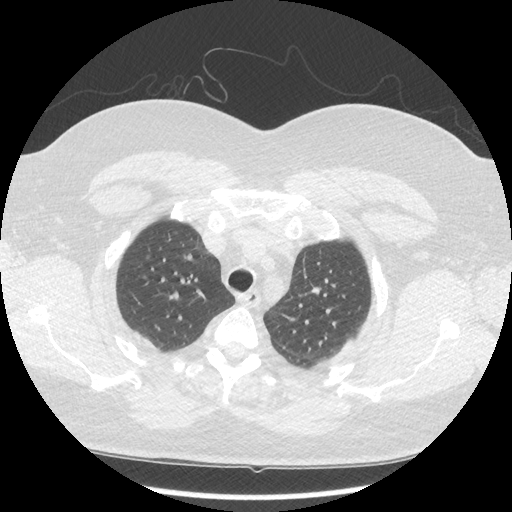
[im 258/318  mediastinal]
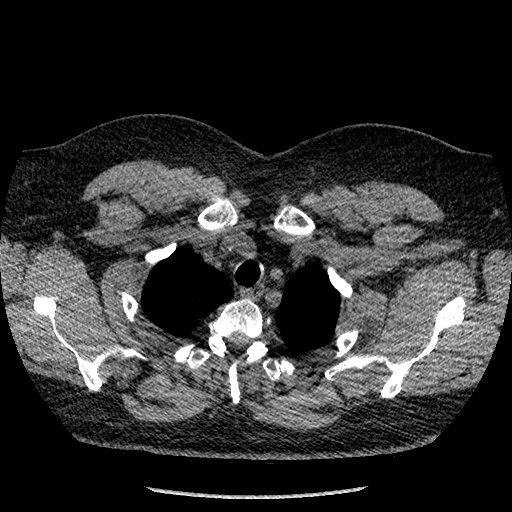
[im 258/318  lung]
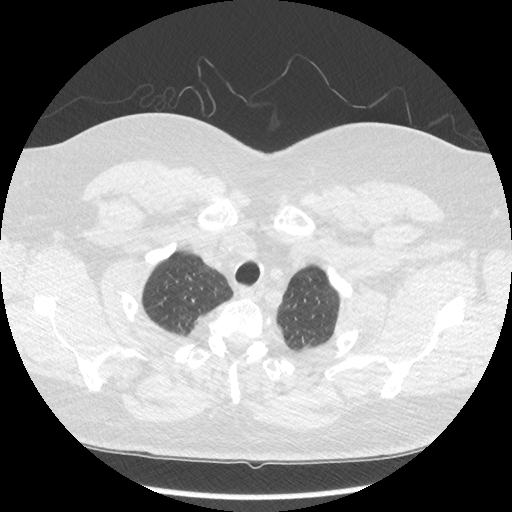
[im 278/318  lung]
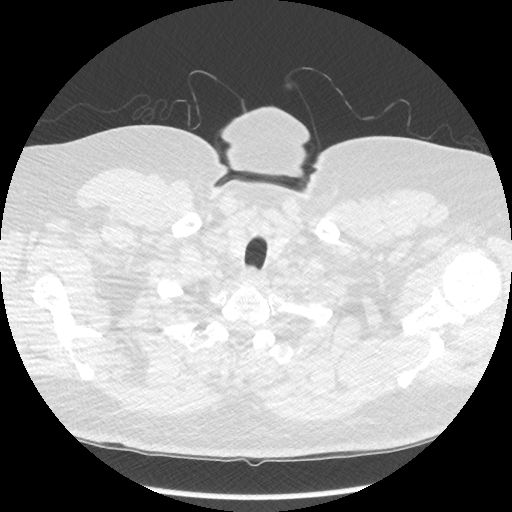
[im 298/318  lung]
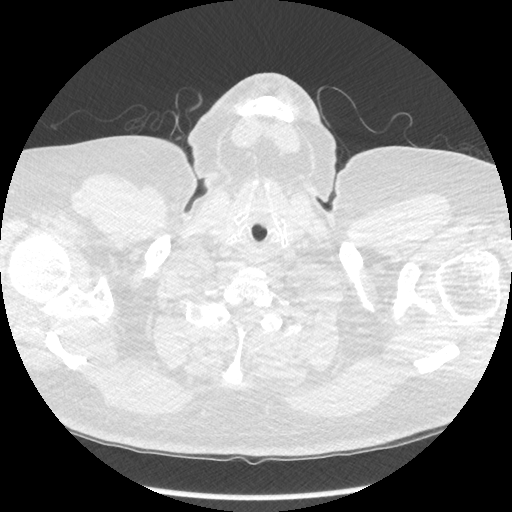

[15 of 30 positions shown; findings below may reference images not displayed]

FINDINGS: Mediastinum / Lymph Nodes: There is no axillary lymphadenopathy. No
mediastinal or hilar lymphadenopathy is evident. Heart size is
borderline enlarged. Coronary artery calcification is noted. No
pericardial effusion.

Ascending aorta measures 4.4 x 4.3 cm in maximum transaxial
dimensions. Transverse aorta measures 3.3 x 3.0 cm. Descending
thoracic aorta measures 2.8 x 2.9 cm.

Lungs / Pleura: No focal airspace consolidation. No pulmonary edema
or pleural effusion. 6 mm left upper lobe nodule is visualized on
image 26 of series for.

[HOSPITAL] / Soft Tissues: Bone windows reveal no worrisome lytic or
sclerotic osseous lesions.

Upper Abdomen: Visualized portions of the liver and spleen have
normal on infused features. No evidence for adrenal nodule.
IMPRESSION: Ascending thoracic aorta measures 4.4 cm in maximum diameter.

6 mm left upper lobe pulmonary nodule. If the patient is at high
risk for bronchogenic carcinoma, follow-up chest CT at 6-12 months
is recommended. If the patient is at low risk for bronchogenic
carcinoma, follow-up chest CT at 12 months is recommended. This
recommendation follows the consensus statement: Guidelines for
Management of Small Pulmonary Nodules Detected on CT Scans: A
Statement from the [HOSPITAL] as published in Radiology

## 2015-09-20 NOTE — Progress Notes (Signed)
DOS 06.09.2017 Left foot wound debridement application of synthetic graft

## 2015-09-23 NOTE — Progress Notes (Signed)
Patient ID: Stephen Medina, male   DOB: February 15, 1950, 65 y.o.   MRN: [id number]  Subjective: Stephen Medina is a 65 y.o. is seen today in office today for continued care of ulceration to his right foot. He states that the wound has started to worsen again and started to become more painful. There has not been much drainage. No pus. No redness or warmth to the foot. No streaking.  Denies any systemic complaints such as fevers, chills, nausea, vomiting. No calf pain, chest pain, shortness of breath.   Objective: General: No acute distress, AAOx3; presents walking cam boot. DP/PT pulses palpable 2/4, CRT < 3 sec to all digits.  Protective sensation decreased. Motor function intact.  Left foot  ulceration today measures about 1.2 x 1 and is superficial with a granular wound base after debridment and a hyperkerotic periwound . There is no probing, undermining or tunneling. No surrounding erythema, ascending cellulitis, fluctuance, crepitus, malodor. No other open lesions or pre-ulcer lesions identified bilaterally. No pain with calf compression, swelling, warmth, erythema.   Assessment and Plan:  Status post right foot wound debridement; wound has worsended  -Treatment options discussed including all alternatives, risks, and complications -At this time will switch to medi-honey. Also wound care referral was placed today. Will contact Dr. Jess Barters office in regards to his wound care sock.  -This has been a difficult wound to treat. He has had surgical debridements, synthetic grafts in the office and multiple wound care products. May need to consider doing a planning of the underlying bone to help take more pressure off of the area but given his foot type in general I am not sure how beneficial that is but at this point may need to consider.  -Continue cam boot. -Limit weightbearing -Elevation -Follow-up as scheduled or sooner if needed.  -Monitor for signs or symptoms of infection to call the office should  any occur or go to the ER. Encouraged to call any questions or concerns or any changes in the meantime.  Celesta Gentile, DPM

## 2015-09-28 ENCOUNTER — Encounter: Payer: Self-pay | Admitting: Podiatry

## 2015-09-28 ENCOUNTER — Ambulatory Visit (INDEPENDENT_AMBULATORY_CARE_PROVIDER_SITE_OTHER): Payer: Medicare Other | Admitting: Podiatry

## 2015-09-28 DIAGNOSIS — L97522 Non-pressure chronic ulcer of other part of left foot with fat layer exposed: Secondary | ICD-10-CM

## 2015-09-28 DIAGNOSIS — L97929 Non-pressure chronic ulcer of unspecified part of left lower leg with unspecified severity: Secondary | ICD-10-CM

## 2015-09-28 DIAGNOSIS — L89891 Pressure ulcer of other site, stage 1: Secondary | ICD-10-CM | POA: Diagnosis not present

## 2015-10-02 ENCOUNTER — Ambulatory Visit (HOSPITAL_COMMUNITY)
Admission: RE | Admit: 2015-10-02 | Discharge: 2015-10-02 | Disposition: A | Discharge status: Home / Self Care | Payer: Medicare Other | Source: Ambulatory Visit | Attending: Surgery | Admitting: Surgery

## 2015-10-02 ENCOUNTER — Other Ambulatory Visit: Payer: Self-pay | Admitting: Surgery

## 2015-10-02 ENCOUNTER — Encounter (HOSPITAL_BASED_OUTPATIENT_CLINIC_OR_DEPARTMENT_OTHER): Payer: Medicare Other | Attending: Surgery

## 2015-10-02 DIAGNOSIS — L97429 Non-pressure chronic ulcer of left heel and midfoot with unspecified severity: Secondary | ICD-10-CM | POA: Diagnosis present

## 2015-10-02 DIAGNOSIS — Z7982 Long term (current) use of aspirin: Secondary | ICD-10-CM | POA: Diagnosis not present

## 2015-10-02 DIAGNOSIS — L84 Corns and callosities: Secondary | ICD-10-CM | POA: Insufficient documentation

## 2015-10-02 DIAGNOSIS — M869 Osteomyelitis, unspecified: Secondary | ICD-10-CM

## 2015-10-02 DIAGNOSIS — M14671 Charcot's joint, right ankle and foot: Secondary | ICD-10-CM | POA: Insufficient documentation

## 2015-10-02 DIAGNOSIS — K219 Gastro-esophageal reflux disease without esophagitis: Secondary | ICD-10-CM | POA: Diagnosis not present

## 2015-10-02 DIAGNOSIS — G6182 Multifocal motor neuropathy: Secondary | ICD-10-CM | POA: Diagnosis not present

## 2015-10-02 DIAGNOSIS — E785 Hyperlipidemia, unspecified: Secondary | ICD-10-CM | POA: Insufficient documentation

## 2015-10-02 DIAGNOSIS — M818 Other osteoporosis without current pathological fracture: Secondary | ICD-10-CM | POA: Insufficient documentation

## 2015-10-02 DIAGNOSIS — M7989 Other specified soft tissue disorders: Secondary | ICD-10-CM | POA: Insufficient documentation

## 2015-10-02 DIAGNOSIS — Z955 Presence of coronary angioplasty implant and graft: Secondary | ICD-10-CM | POA: Insufficient documentation

## 2015-10-02 DIAGNOSIS — L97521 Non-pressure chronic ulcer of other part of left foot limited to breakdown of skin: Secondary | ICD-10-CM | POA: Diagnosis present

## 2015-10-02 DIAGNOSIS — Z79899 Other long term (current) drug therapy: Secondary | ICD-10-CM | POA: Insufficient documentation

## 2015-10-02 DIAGNOSIS — I10 Essential (primary) hypertension: Secondary | ICD-10-CM | POA: Diagnosis not present

## 2015-10-02 DIAGNOSIS — Z6841 Body Mass Index (BMI) 40.0 and over, adult: Secondary | ICD-10-CM | POA: Insufficient documentation

## 2015-10-02 DIAGNOSIS — I2510 Atherosclerotic heart disease of native coronary artery without angina pectoris: Secondary | ICD-10-CM | POA: Diagnosis not present

## 2015-10-02 DIAGNOSIS — Z7902 Long term (current) use of antithrombotics/antiplatelets: Secondary | ICD-10-CM | POA: Diagnosis not present

## 2015-10-02 DIAGNOSIS — Z9889 Other specified postprocedural states: Secondary | ICD-10-CM | POA: Insufficient documentation

## 2015-10-02 DIAGNOSIS — M7732 Calcaneal spur, left foot: Secondary | ICD-10-CM | POA: Diagnosis not present

## 2015-10-05 NOTE — Progress Notes (Signed)
Patient ID: Stephen Medina, male   DOB: Nov 21, 1950, 65 y.o.   MRN: [id number]  Subjective: Stephen Medina is a 65 y.o. is seen today in office today for continued care of ulceration to his right foot. He feels that overall the wound is doing about the exact same as it was last appointment. He is awaiting the wound care center appointment. Denies any drainage or pus any redness or red streaks. Denies any systemic complaints such as fevers, chills, nausea, vomiting. No calf pain, chest pain, shortness of breath.   Objective: General: No acute distress, AAOx3; presents walking cam boot. DP/PT pulses palpable 2/4, CRT < 3 sec to all digits.  Protective sensation decreased. Motor function intact.  Left foot  ulceration today measures about 1.2 x 1 x 0.2 with a granular wound base after debridment and   minimalhyperkerotic periwound. All of the wound is about the same size it does appear to have a more granular, healthy wound base. There is no drainage or pus there is no surrounding erythema or ascending cellulitis. There is no fluctuance or crepitus and there is no malodor. No other open lesions or pre-ulcer lesions identified bilaterally. No pain with calf compression, swelling, warmth, erythema.   Assessment and Plan:  Status post right foot wound debridement; wound is about the same in size  -Treatment options discussed including all alternatives, risks, and complications -Wound sharply debrided today utilizing a scalpel as well as a tissue nipper. We went down to subcutaneous tissue. Continue with daily dressing changes with met honey for now. He is awaiting wound care center appointment which is next week. -Will try to get Dr. Jess Barters wound care sock as well.  -Monitor his signs or symptoms of infection. She had occur. -Continue cam walker. He has been doing quite a bit of walking throughout this whole process in that he was recommended to be nonweightbearing. Because of this I don't appear  tolerated total contact cast. He was having multiple back issues as well and was difficult to stay nonweightbearing. I also discussed with him again today as another surgery to shave the bone to help take pressure off this area but given the structure not sure if this is to help completely.  Celesta Gentile, DPM

## 2015-10-09 ENCOUNTER — Encounter (HOSPITAL_BASED_OUTPATIENT_CLINIC_OR_DEPARTMENT_OTHER): Payer: Medicare Other

## 2015-10-09 DIAGNOSIS — L97521 Non-pressure chronic ulcer of other part of left foot limited to breakdown of skin: Secondary | ICD-10-CM | POA: Diagnosis not present

## 2015-10-12 ENCOUNTER — Ambulatory Visit: Payer: Medicare Other | Admitting: Podiatry

## 2015-10-16 ENCOUNTER — Encounter (HOSPITAL_BASED_OUTPATIENT_CLINIC_OR_DEPARTMENT_OTHER): Payer: Medicare Other | Attending: Surgery

## 2015-10-16 DIAGNOSIS — I10 Essential (primary) hypertension: Secondary | ICD-10-CM | POA: Insufficient documentation

## 2015-10-16 DIAGNOSIS — Z87891 Personal history of nicotine dependence: Secondary | ICD-10-CM | POA: Insufficient documentation

## 2015-10-16 DIAGNOSIS — S91002A Unspecified open wound, left ankle, initial encounter: Secondary | ICD-10-CM | POA: Insufficient documentation

## 2015-10-16 DIAGNOSIS — A4901 Methicillin susceptible Staphylococcus aureus infection, unspecified site: Secondary | ICD-10-CM | POA: Diagnosis not present

## 2015-10-16 DIAGNOSIS — G6182 Multifocal motor neuropathy: Secondary | ICD-10-CM | POA: Diagnosis not present

## 2015-10-16 DIAGNOSIS — X58XXXA Exposure to other specified factors, initial encounter: Secondary | ICD-10-CM | POA: Insufficient documentation

## 2015-10-16 DIAGNOSIS — M14672 Charcot's joint, left ankle and foot: Secondary | ICD-10-CM | POA: Insufficient documentation

## 2015-10-16 DIAGNOSIS — L97521 Non-pressure chronic ulcer of other part of left foot limited to breakdown of skin: Secondary | ICD-10-CM | POA: Diagnosis present

## 2015-10-16 DIAGNOSIS — I2510 Atherosclerotic heart disease of native coronary artery without angina pectoris: Secondary | ICD-10-CM | POA: Diagnosis not present

## 2015-10-16 DIAGNOSIS — Z6841 Body Mass Index (BMI) 40.0 and over, adult: Secondary | ICD-10-CM | POA: Diagnosis not present

## 2015-10-23 DIAGNOSIS — L97521 Non-pressure chronic ulcer of other part of left foot limited to breakdown of skin: Secondary | ICD-10-CM | POA: Diagnosis not present

## 2015-10-30 DIAGNOSIS — L97521 Non-pressure chronic ulcer of other part of left foot limited to breakdown of skin: Secondary | ICD-10-CM | POA: Diagnosis not present

## 2015-11-06 DIAGNOSIS — L97521 Non-pressure chronic ulcer of other part of left foot limited to breakdown of skin: Secondary | ICD-10-CM | POA: Diagnosis not present

## 2015-11-08 DIAGNOSIS — L97521 Non-pressure chronic ulcer of other part of left foot limited to breakdown of skin: Secondary | ICD-10-CM | POA: Diagnosis not present

## 2015-11-12 DIAGNOSIS — L97521 Non-pressure chronic ulcer of other part of left foot limited to breakdown of skin: Secondary | ICD-10-CM | POA: Diagnosis not present

## 2015-11-14 ENCOUNTER — Encounter (HOSPITAL_BASED_OUTPATIENT_CLINIC_OR_DEPARTMENT_OTHER): Payer: Medicare Other | Attending: Surgery

## 2015-11-14 DIAGNOSIS — I2510 Atherosclerotic heart disease of native coronary artery without angina pectoris: Secondary | ICD-10-CM | POA: Insufficient documentation

## 2015-11-14 DIAGNOSIS — L97421 Non-pressure chronic ulcer of left heel and midfoot limited to breakdown of skin: Secondary | ICD-10-CM | POA: Insufficient documentation

## 2015-11-14 DIAGNOSIS — I10 Essential (primary) hypertension: Secondary | ICD-10-CM | POA: Insufficient documentation

## 2015-11-22 DIAGNOSIS — I2510 Atherosclerotic heart disease of native coronary artery without angina pectoris: Secondary | ICD-10-CM | POA: Diagnosis not present

## 2015-11-22 DIAGNOSIS — L97421 Non-pressure chronic ulcer of left heel and midfoot limited to breakdown of skin: Secondary | ICD-10-CM | POA: Diagnosis present

## 2015-11-22 DIAGNOSIS — I10 Essential (primary) hypertension: Secondary | ICD-10-CM | POA: Diagnosis not present

## 2015-11-29 DIAGNOSIS — L97421 Non-pressure chronic ulcer of left heel and midfoot limited to breakdown of skin: Secondary | ICD-10-CM | POA: Diagnosis not present

## 2015-12-05 DIAGNOSIS — L97421 Non-pressure chronic ulcer of left heel and midfoot limited to breakdown of skin: Secondary | ICD-10-CM | POA: Diagnosis not present

## 2015-12-13 DIAGNOSIS — L97421 Non-pressure chronic ulcer of left heel and midfoot limited to breakdown of skin: Secondary | ICD-10-CM | POA: Diagnosis not present

## 2015-12-20 ENCOUNTER — Encounter (HOSPITAL_BASED_OUTPATIENT_CLINIC_OR_DEPARTMENT_OTHER): Payer: Medicare Other | Attending: Internal Medicine

## 2015-12-20 DIAGNOSIS — Z6841 Body Mass Index (BMI) 40.0 and over, adult: Secondary | ICD-10-CM | POA: Diagnosis not present

## 2015-12-20 DIAGNOSIS — L84 Corns and callosities: Secondary | ICD-10-CM | POA: Diagnosis not present

## 2015-12-20 DIAGNOSIS — L97421 Non-pressure chronic ulcer of left heel and midfoot limited to breakdown of skin: Secondary | ICD-10-CM | POA: Insufficient documentation

## 2015-12-20 DIAGNOSIS — M14672 Charcot's joint, left ankle and foot: Secondary | ICD-10-CM | POA: Diagnosis not present

## 2015-12-27 DIAGNOSIS — L97421 Non-pressure chronic ulcer of left heel and midfoot limited to breakdown of skin: Secondary | ICD-10-CM | POA: Diagnosis not present

## 2016-01-03 DIAGNOSIS — L97421 Non-pressure chronic ulcer of left heel and midfoot limited to breakdown of skin: Secondary | ICD-10-CM | POA: Diagnosis not present

## 2016-01-10 DIAGNOSIS — L97421 Non-pressure chronic ulcer of left heel and midfoot limited to breakdown of skin: Secondary | ICD-10-CM | POA: Diagnosis not present

## 2016-01-17 ENCOUNTER — Encounter (HOSPITAL_BASED_OUTPATIENT_CLINIC_OR_DEPARTMENT_OTHER): Payer: Medicare Other | Attending: Internal Medicine

## 2016-01-17 DIAGNOSIS — Z6841 Body Mass Index (BMI) 40.0 and over, adult: Secondary | ICD-10-CM | POA: Diagnosis not present

## 2016-01-17 DIAGNOSIS — Z09 Encounter for follow-up examination after completed treatment for conditions other than malignant neoplasm: Secondary | ICD-10-CM | POA: Diagnosis not present

## 2016-01-17 DIAGNOSIS — I10 Essential (primary) hypertension: Secondary | ICD-10-CM | POA: Diagnosis not present

## 2016-01-17 DIAGNOSIS — Z872 Personal history of diseases of the skin and subcutaneous tissue: Secondary | ICD-10-CM | POA: Diagnosis not present

## 2016-01-17 DIAGNOSIS — G6182 Multifocal motor neuropathy: Secondary | ICD-10-CM | POA: Insufficient documentation

## 2016-01-17 DIAGNOSIS — I2510 Atherosclerotic heart disease of native coronary artery without angina pectoris: Secondary | ICD-10-CM | POA: Diagnosis not present

## 2016-01-17 DIAGNOSIS — A5216 Charcot's arthropathy (tabetic): Secondary | ICD-10-CM | POA: Insufficient documentation

## 2016-01-22 DIAGNOSIS — M48062 Spinal stenosis, lumbar region with neurogenic claudication: Secondary | ICD-10-CM

## 2016-01-22 HISTORY — DX: Spinal stenosis, lumbar region with neurogenic claudication: M48.062

## 2016-01-23 ENCOUNTER — Other Ambulatory Visit: Payer: Self-pay | Admitting: Neurosurgery

## 2016-01-23 DIAGNOSIS — M5126 Other intervertebral disc displacement, lumbar region: Secondary | ICD-10-CM

## 2016-02-02 ENCOUNTER — Ambulatory Visit
Admission: RE | Admit: 2016-02-02 | Discharge: 2016-02-02 | Disposition: A | Discharge status: Home / Self Care | Payer: Medicare Other | Source: Ambulatory Visit | Attending: Neurosurgery | Admitting: Neurosurgery

## 2016-02-02 DIAGNOSIS — M5126 Other intervertebral disc displacement, lumbar region: Secondary | ICD-10-CM

## 2016-02-02 MED ORDER — GADOBENATE DIMEGLUMINE 529 MG/ML IV SOLN
20.0000 mL | Freq: Once | INTRAVENOUS | Status: AC | PRN
  Administered 2016-02-02: 20 mL via INTRAVENOUS

## 2016-02-07 ENCOUNTER — Other Ambulatory Visit: Payer: Self-pay | Admitting: Neurosurgery

## 2016-02-12 ENCOUNTER — Encounter (HOSPITAL_COMMUNITY): Payer: Self-pay | Admitting: *Deleted

## 2016-02-12 MED ORDER — DEXTROSE 5 % IV SOLN
3.0000 g | INTRAVENOUS | Status: AC
  Administered 2016-02-13: 3 g via INTRAVENOUS
  Filled 2016-02-12: qty 3000

## 2016-02-12 NOTE — Progress Notes (Signed)
Pt denies SOB and chest pain but is under the care of [facility]iology. Pt stated that his last visit with Dr. Wynonia Lawman was on 02/07/16 when he had an EKG and was given cardiac clearance; Juliann Pulse to fax bot EKG and LOV note. Pt stated that his last dose of Plavix and Aspirin was 02/07/16 as instructed by MD. Pt denies having a chest x ray within the last year.  Pt made aware to stop taking vitamins, fish oil, and herbal medications. Do not take any NSAIDs ie: Ibuprofen, Advil, Naproxen, BC and Goody Powder or any medication containing Aspirin. Pt verbalized understanding of all pre-op instructions.

## 2016-02-12 NOTE — Progress Notes (Signed)
   02/12/16 [facility]  Have you ever been diagnosed with sleep apnea through a sleep study? No  Do you snore loudly (loud enough to be heard through closed doors)?  0  Do you often feel tired, fatigued, or sleepy during the daytime (such as falling asleep during driving or talking to someone)? 0  Has anyone observed you stop breathing during your sleep? 0  Do you have, or are you being treated for high blood pressure? 1  BMI more than 35 kg/m2? 1  Age > 50 (1-yes) 1  Neck circumference greater than:Male 16 inches or larger, Male 17inches or larger? 1  Male Gender (Yes=1) 1  Obstructive Sleep Apnea Score 5

## 2016-02-13 ENCOUNTER — Ambulatory Visit (HOSPITAL_COMMUNITY): Payer: Medicare Other

## 2016-02-13 ENCOUNTER — Ambulatory Visit (HOSPITAL_COMMUNITY): Payer: Medicare Other | Admitting: Certified Registered"

## 2016-02-13 ENCOUNTER — Encounter (HOSPITAL_COMMUNITY): Payer: Self-pay | Admitting: *Deleted

## 2016-02-13 ENCOUNTER — Encounter (HOSPITAL_COMMUNITY)
Admission: RE | Disposition: A | Discharge status: Home / Self Care | Payer: Self-pay | Source: Ambulatory Visit | Attending: Neurosurgery

## 2016-02-13 ENCOUNTER — Ambulatory Visit (HOSPITAL_COMMUNITY)
Admission: RE | Admit: 2016-02-13 | Discharge: 2016-02-13 | Disposition: A | Discharge status: Home / Self Care | Payer: Medicare Other | Source: Ambulatory Visit | Attending: Neurosurgery | Admitting: Neurosurgery

## 2016-02-13 DIAGNOSIS — Z7982 Long term (current) use of aspirin: Secondary | ICD-10-CM | POA: Diagnosis not present

## 2016-02-13 DIAGNOSIS — Z7902 Long term (current) use of antithrombotics/antiplatelets: Secondary | ICD-10-CM | POA: Insufficient documentation

## 2016-02-13 DIAGNOSIS — Z8249 Family history of ischemic heart disease and other diseases of the circulatory system: Secondary | ICD-10-CM | POA: Insufficient documentation

## 2016-02-13 DIAGNOSIS — I10 Essential (primary) hypertension: Secondary | ICD-10-CM | POA: Insufficient documentation

## 2016-02-13 DIAGNOSIS — Z419 Encounter for procedure for purposes other than remedying health state, unspecified: Secondary | ICD-10-CM

## 2016-02-13 DIAGNOSIS — M5127 Other intervertebral disc displacement, lumbosacral region: Secondary | ICD-10-CM | POA: Diagnosis present

## 2016-02-13 DIAGNOSIS — Z79899 Other long term (current) drug therapy: Secondary | ICD-10-CM | POA: Insufficient documentation

## 2016-02-13 DIAGNOSIS — E785 Hyperlipidemia, unspecified: Secondary | ICD-10-CM | POA: Insufficient documentation

## 2016-02-13 DIAGNOSIS — M5126 Other intervertebral disc displacement, lumbar region: Secondary | ICD-10-CM | POA: Diagnosis present

## 2016-02-13 DIAGNOSIS — I2510 Atherosclerotic heart disease of native coronary artery without angina pectoris: Secondary | ICD-10-CM | POA: Insufficient documentation

## 2016-02-13 DIAGNOSIS — Z6841 Body Mass Index (BMI) 40.0 and over, adult: Secondary | ICD-10-CM | POA: Diagnosis not present

## 2016-02-13 HISTORY — PX: LUMBAR LAMINECTOMY/DECOMPRESSION MICRODISCECTOMY: SHX5026

## 2016-02-13 LAB — CBC
HCT: 39.2 % (ref 39.0–52.0)
Hemoglobin: 13 g/dL (ref 13.0–17.0)
MCH: 29.4 pg (ref 26.0–34.0)
MCHC: 33.2 g/dL (ref 30.0–36.0)
MCV: 88.7 fL (ref 78.0–100.0)
Platelets: 239 10*3/uL (ref 150–400)
RBC: 4.42 MIL/uL (ref 4.22–5.81)
RDW: 14 % (ref 11.5–15.5)
WBC: 7.4 10*3/uL (ref 4.0–10.5)

## 2016-02-13 LAB — BASIC METABOLIC PANEL
Anion gap: 9 (ref 5–15)
BUN: 12 mg/dL (ref 6–20)
CO2: 23 mmol/L (ref 22–32)
Calcium: 9 mg/dL (ref 8.9–10.3)
Chloride: 103 mmol/L (ref 101–111)
Creatinine, Ser: 0.9 mg/dL (ref 0.61–1.24)
GFR calc Af Amer: >60 mL/min (ref >60)
GFR calc non Af Amer: >60 mL/min (ref >60)
Glucose, Bld: 110 mg/dL — ABNORMAL HIGH (ref 65–99)
Potassium: 4.2 mmol/L (ref 3.5–5.1)
Sodium: 135 mmol/L (ref 135–145)

## 2016-02-13 LAB — SURGICAL PCR SCREEN
MRSA, PCR: NEGATIVE
Staphylococcus aureus: POSITIVE — AB

## 2016-02-13 SURGERY — LUMBAR LAMINECTOMY/DECOMPRESSION MICRODISCECTOMY 1 LEVEL
Anesthesia: General | Laterality: Left

## 2016-02-13 MED ORDER — ROCURONIUM BROMIDE 100 MG/10ML IV SOLN
INTRAVENOUS | Status: DC | PRN
  Administered 2016-02-13: 50 mg via INTRAVENOUS
  Administered 2016-02-13: 10 mg via INTRAVENOUS
  Administered 2016-02-13: 20 mg via INTRAVENOUS

## 2016-02-13 MED ORDER — MUPIROCIN 2 % EX OINT
TOPICAL_OINTMENT | CUTANEOUS | Status: AC
  Filled 2016-02-13: qty 22

## 2016-02-13 MED ORDER — TIZANIDINE HCL 4 MG PO TABS: 4.0000 mg | ORAL_TABLET | Freq: Four times a day (QID) | ORAL | 0 refills | Status: DC | PRN

## 2016-02-13 MED ORDER — LIDOCAINE 2% (20 MG/ML) 5 ML SYRINGE
INTRAMUSCULAR | Status: AC
  Filled 2016-02-13: qty 5

## 2016-02-13 MED ORDER — HYDROMORPHONE HCL 1 MG/ML IJ SOLN: 0.5000 mg | INTRAMUSCULAR | Status: DC | PRN

## 2016-02-13 MED ORDER — ONDANSETRON HCL 4 MG/2ML IJ SOLN
INTRAMUSCULAR | Status: AC
  Filled 2016-02-13: qty 2

## 2016-02-13 MED ORDER — DOCUSATE SODIUM 100 MG PO CAPS: 100.0000 mg | ORAL_CAPSULE | Freq: Two times a day (BID) | ORAL | Status: DC

## 2016-02-13 MED ORDER — LOSARTAN POTASSIUM 50 MG PO TABS: 100.0000 mg | ORAL_TABLET | Freq: Every day | ORAL | Status: DC

## 2016-02-13 MED ORDER — LIDOCAINE-EPINEPHRINE (PF) 2 %-1:200000 IJ SOLN
INTRAMUSCULAR | Status: DC | PRN
  Administered 2016-02-13: 5 mL via INTRADERMAL

## 2016-02-13 MED ORDER — DIAZEPAM 5 MG PO TABS: 5.0000 mg | ORAL_TABLET | Freq: Four times a day (QID) | ORAL | Status: DC | PRN

## 2016-02-13 MED ORDER — FENTANYL CITRATE (PF) 100 MCG/2ML IJ SOLN
INTRAMUSCULAR | Status: AC
  Filled 2016-02-13: qty 2

## 2016-02-13 MED ORDER — CHLORHEXIDINE GLUCONATE CLOTH 2 % EX PADS: 6.0000 | MEDICATED_PAD | Freq: Once | CUTANEOUS | Status: DC

## 2016-02-13 MED ORDER — PHENOL 1.4 % MT LIQD: 1.0000 | OROMUCOSAL | Status: DC | PRN

## 2016-02-13 MED ORDER — MAGNESIUM CITRATE PO SOLN: 1.0000 | Freq: Once | ORAL | Status: DC | PRN

## 2016-02-13 MED ORDER — MENTHOL 3 MG MT LOZG: 1.0000 | LOZENGE | OROMUCOSAL | Status: DC | PRN

## 2016-02-13 MED ORDER — SUGAMMADEX SODIUM 200 MG/2ML IV SOLN
INTRAVENOUS | Status: DC | PRN
  Administered 2016-02-13: 500 mg via INTRAVENOUS

## 2016-02-13 MED ORDER — ONDANSETRON HCL 4 MG/2ML IJ SOLN: 4.0000 mg | INTRAMUSCULAR | Status: DC | PRN

## 2016-02-13 MED ORDER — SUGAMMADEX SODIUM 500 MG/5ML IV SOLN
INTRAVENOUS | Status: AC
  Filled 2016-02-13: qty 5

## 2016-02-13 MED ORDER — OXYCODONE-ACETAMINOPHEN 5-325 MG PO TABS: 1.0000 | ORAL_TABLET | Freq: Four times a day (QID) | ORAL | 0 refills | Status: DC | PRN

## 2016-02-13 MED ORDER — ASPIRIN 325 MG PO TABS
325.0000 mg | ORAL_TABLET | Freq: Every day | ORAL | Status: DC
  Filled 2016-02-13: qty 1

## 2016-02-13 MED ORDER — ZOLPIDEM TARTRATE 5 MG PO TABS: 5.0000 mg | ORAL_TABLET | Freq: Every evening | ORAL | Status: DC | PRN

## 2016-02-13 MED ORDER — FENTANYL CITRATE (PF) 100 MCG/2ML IJ SOLN
INTRAMUSCULAR | Status: DC | PRN
  Administered 2016-02-13: 50 ug via INTRAVENOUS
  Administered 2016-02-13: 100 ug via INTRAVENOUS
  Administered 2016-02-13: 50 ug via INTRAVENOUS

## 2016-02-13 MED ORDER — SODIUM CHLORIDE 0.9% FLUSH: 3.0000 mL | Freq: Two times a day (BID) | INTRAVENOUS | Status: DC

## 2016-02-13 MED ORDER — HYDROMORPHONE HCL 1 MG/ML IJ SOLN
0.2500 mg | INTRAMUSCULAR | Status: DC | PRN
  Administered 2016-02-13 (×4): 0.5 mg via INTRAVENOUS

## 2016-02-13 MED ORDER — LIDOCAINE-EPINEPHRINE (PF) 2 %-1:200000 IJ SOLN
INTRAMUSCULAR | Status: AC
  Filled 2016-02-13: qty 20

## 2016-02-13 MED ORDER — LACTATED RINGERS IV SOLN
INTRAVENOUS | Status: DC
  Administered 2016-02-13 (×2): via INTRAVENOUS

## 2016-02-13 MED ORDER — PROPOFOL 10 MG/ML IV BOLUS
INTRAVENOUS | Status: AC
  Filled 2016-02-13: qty 20

## 2016-02-13 MED ORDER — 0.9 % SODIUM CHLORIDE (POUR BTL) OPTIME
TOPICAL | Status: DC | PRN
  Administered 2016-02-13: 1000 mL

## 2016-02-13 MED ORDER — LOSARTAN POTASSIUM-HCTZ 100-12.5 MG PO TABS: 1.0000 | ORAL_TABLET | Freq: Every day | ORAL | Status: DC

## 2016-02-13 MED ORDER — LIDOCAINE HCL (CARDIAC) 20 MG/ML IV SOLN
INTRAVENOUS | Status: DC | PRN
  Administered 2016-02-13: 100 mg via INTRAVENOUS

## 2016-02-13 MED ORDER — ROSUVASTATIN CALCIUM 20 MG PO TABS: 20.0000 mg | ORAL_TABLET | Freq: Every day | ORAL | Status: DC

## 2016-02-13 MED ORDER — SODIUM CHLORIDE 0.9 % IV SOLN: 250.0000 mL | INTRAVENOUS | Status: DC

## 2016-02-13 MED ORDER — KETOROLAC TROMETHAMINE 30 MG/ML IJ SOLN: 30.0000 mg | Freq: Four times a day (QID) | INTRAMUSCULAR | Status: DC

## 2016-02-13 MED ORDER — PROMETHAZINE HCL 25 MG/ML IJ SOLN: 6.2500 mg | INTRAMUSCULAR | Status: DC | PRN

## 2016-02-13 MED ORDER — METOPROLOL SUCCINATE ER 25 MG PO TB24: 25.0000 mg | ORAL_TABLET | Freq: Every day | ORAL | Status: DC

## 2016-02-13 MED ORDER — THROMBIN 5000 UNITS EX SOLR
CUTANEOUS | Status: AC
  Filled 2016-02-13: qty 10000

## 2016-02-13 MED ORDER — PROPOFOL 10 MG/ML IV BOLUS
INTRAVENOUS | Status: DC | PRN
  Administered 2016-02-13: 200 mg via INTRAVENOUS

## 2016-02-13 MED ORDER — ROCURONIUM BROMIDE 50 MG/5ML IV SOSY
PREFILLED_SYRINGE | INTRAVENOUS | Status: AC
  Filled 2016-02-13: qty 5

## 2016-02-13 MED ORDER — MIDAZOLAM HCL 2 MG/2ML IJ SOLN
INTRAMUSCULAR | Status: AC
  Filled 2016-02-13: qty 2

## 2016-02-13 MED ORDER — SUCCINYLCHOLINE CHLORIDE 200 MG/10ML IV SOSY
PREFILLED_SYRINGE | INTRAVENOUS | Status: AC
  Filled 2016-02-13: qty 10

## 2016-02-13 MED ORDER — OXYCODONE-ACETAMINOPHEN 5-325 MG PO TABS
1.0000 | ORAL_TABLET | Freq: Four times a day (QID) | ORAL | Status: DC | PRN
  Administered 2016-02-13: 2 via ORAL

## 2016-02-13 MED ORDER — ONDANSETRON HCL 4 MG/2ML IJ SOLN
INTRAMUSCULAR | Status: DC | PRN
  Administered 2016-02-13: 4 mg via INTRAVENOUS

## 2016-02-13 MED ORDER — SODIUM CHLORIDE 0.9% FLUSH: 3.0000 mL | INTRAVENOUS | Status: DC | PRN

## 2016-02-13 MED ORDER — HEMOSTATIC AGENTS (NO CHARGE) OPTIME
TOPICAL | Status: DC | PRN
  Administered 2016-02-13: 1 via TOPICAL

## 2016-02-13 MED ORDER — THROMBIN 5000 UNITS EX SOLR
CUTANEOUS | Status: DC | PRN
  Administered 2016-02-13 (×2): 5000 [IU] via TOPICAL

## 2016-02-13 MED ORDER — SENNOSIDES-DOCUSATE SODIUM 8.6-50 MG PO TABS: 1.0000 | ORAL_TABLET | Freq: Every evening | ORAL | Status: DC | PRN

## 2016-02-13 MED ORDER — ACETAMINOPHEN 650 MG RE SUPP: 650.0000 mg | RECTAL | Status: DC | PRN

## 2016-02-13 MED ORDER — HYDROMORPHONE HCL 2 MG/ML IJ SOLN
INTRAMUSCULAR | Status: AC
  Filled 2016-02-13: qty 1

## 2016-02-13 MED ORDER — BISACODYL 5 MG PO TBEC: 5.0000 mg | DELAYED_RELEASE_TABLET | Freq: Every day | ORAL | Status: DC | PRN

## 2016-02-13 MED ORDER — ISOSORBIDE MONONITRATE ER 30 MG PO TB24: 30.0000 mg | ORAL_TABLET | Freq: Every day | ORAL | Status: DC

## 2016-02-13 MED ORDER — MUPIROCIN 2 % EX OINT
TOPICAL_OINTMENT | Freq: Once | CUTANEOUS | Status: AC
  Administered 2016-02-13: 1 via NASAL
  Filled 2016-02-13: qty 22

## 2016-02-13 MED ORDER — BUPIVACAINE HCL (PF) 0.5 % IJ SOLN
INTRAMUSCULAR | Status: AC
  Filled 2016-02-13: qty 30

## 2016-02-13 MED ORDER — MIDAZOLAM HCL 2 MG/2ML IJ SOLN
INTRAMUSCULAR | Status: DC | PRN
  Administered 2016-02-13: 2 mg via INTRAVENOUS

## 2016-02-13 MED ORDER — OXYCODONE-ACETAMINOPHEN 5-325 MG PO TABS
ORAL_TABLET | ORAL | Status: AC
  Filled 2016-02-13: qty 2

## 2016-02-13 MED ORDER — HYDROCODONE-ACETAMINOPHEN 5-325 MG PO TABS: 1.0000 | ORAL_TABLET | ORAL | Status: DC | PRN

## 2016-02-13 MED ORDER — SUCCINYLCHOLINE CHLORIDE 20 MG/ML IJ SOLN
INTRAMUSCULAR | Status: DC | PRN
  Administered 2016-02-13: 120 mg via INTRAVENOUS

## 2016-02-13 MED ORDER — HYDROCHLOROTHIAZIDE 12.5 MG PO CAPS: 12.5000 mg | ORAL_CAPSULE | Freq: Every day | ORAL | Status: DC

## 2016-02-13 MED ORDER — POTASSIUM CHLORIDE IN NACL 20-0.9 MEQ/L-% IV SOLN: INTRAVENOUS | Status: DC

## 2016-02-13 MED ORDER — ACETAMINOPHEN 325 MG PO TABS: 650.0000 mg | ORAL_TABLET | ORAL | Status: DC | PRN

## 2016-02-13 SURGICAL SUPPLY — 51 items
BAG DECANTER FOR FLEXI CONT (MISCELLANEOUS) ×2 IMPLANT
BENZOIN TINCTURE PRP APPL 2/3 (GAUZE/BANDAGES/DRESSINGS) IMPLANT
BLADE CLIPPER SURG (BLADE) IMPLANT
BUR MATCHSTICK NEURO 3.0 LAGG (BURR) ×2 IMPLANT
CANISTER SUCT 3000ML PPV (MISCELLANEOUS) ×2 IMPLANT
CARTRIDGE OIL MAESTRO DRILL (MISCELLANEOUS) ×1 IMPLANT
DECANTER SPIKE VIAL GLASS SM (MISCELLANEOUS) ×2 IMPLANT
DERMABOND ADVANCED (GAUZE/BANDAGES/DRESSINGS) ×1
DERMABOND ADVANCED .7 DNX12 (GAUZE/BANDAGES/DRESSINGS) ×1 IMPLANT
DIFFUSER DRILL AIR PNEUMATIC (MISCELLANEOUS) ×2 IMPLANT
DRAPE LAPAROTOMY 100X72X124 (DRAPES) ×2 IMPLANT
DRAPE MICROSCOPE LEICA (MISCELLANEOUS) ×2 IMPLANT
DRAPE POUCH INSTRU U-SHP 10X18 (DRAPES) ×2 IMPLANT
DRAPE SURG 17X23 STRL (DRAPES) ×2 IMPLANT
DURAPREP 26ML APPLICATOR (WOUND CARE) ×2 IMPLANT
ELECT BLADE 6.5 EXT (BLADE) ×2 IMPLANT
ELECT REM PT RETURN 9FT ADLT (ELECTROSURGICAL) ×2
ELECTRODE REM PT RTRN 9FT ADLT (ELECTROSURGICAL) ×1 IMPLANT
GAUZE SPONGE 4X4 12PLY STRL (GAUZE/BANDAGES/DRESSINGS) IMPLANT
GAUZE SPONGE 4X4 16PLY XRAY LF (GAUZE/BANDAGES/DRESSINGS) IMPLANT
GLOVE BIO SURGEON STRL SZ8 (GLOVE) ×2 IMPLANT
GLOVE ECLIPSE 6.5 STRL STRAW (GLOVE) ×2 IMPLANT
GLOVE EXAM NITRILE LRG STRL (GLOVE) IMPLANT
GLOVE EXAM NITRILE XL STR (GLOVE) IMPLANT
GLOVE EXAM NITRILE XS STR PU (GLOVE) IMPLANT
GLOVE INDICATOR 8.5 STRL (GLOVE) ×2 IMPLANT
GOWN STRL REUS W/ TWL LRG LVL3 (GOWN DISPOSABLE) ×1 IMPLANT
GOWN STRL REUS W/ TWL XL LVL3 (GOWN DISPOSABLE) ×2 IMPLANT
GOWN STRL REUS W/TWL 2XL LVL3 (GOWN DISPOSABLE) IMPLANT
GOWN STRL REUS W/TWL LRG LVL3 (GOWN DISPOSABLE) ×1
GOWN STRL REUS W/TWL XL LVL3 (GOWN DISPOSABLE) ×2
KIT BASIN OR (CUSTOM PROCEDURE TRAY) ×2 IMPLANT
KIT ROOM TURNOVER OR (KITS) ×2 IMPLANT
NEEDLE HYPO 25X1 1.5 SAFETY (NEEDLE) ×2 IMPLANT
NEEDLE SPNL 18GX3.5 QUINCKE PK (NEEDLE) ×2 IMPLANT
NS IRRIG 1000ML POUR BTL (IV SOLUTION) ×2 IMPLANT
OIL CARTRIDGE MAESTRO DRILL (MISCELLANEOUS) ×2
PACK LAMINECTOMY NEURO (CUSTOM PROCEDURE TRAY) ×2 IMPLANT
PAD ARMBOARD 7.5X6 YLW CONV (MISCELLANEOUS) ×6 IMPLANT
RUBBERBAND STERILE (MISCELLANEOUS) ×4 IMPLANT
SPONGE LAP 4X18 X RAY DECT (DISPOSABLE) IMPLANT
SPONGE SURGIFOAM ABS GEL SZ50 (HEMOSTASIS) ×2 IMPLANT
STRIP CLOSURE SKIN 1/2X4 (GAUZE/BANDAGES/DRESSINGS) IMPLANT
SUT ETHILON 3 0 FSL (SUTURE) ×2 IMPLANT
SUT VIC AB 0 CT1 18XCR BRD8 (SUTURE) ×1 IMPLANT
SUT VIC AB 0 CT1 8-18 (SUTURE) ×1
SUT VIC AB 2-0 CT1 18 (SUTURE) ×2 IMPLANT
SUT VIC AB 3-0 SH 8-18 (SUTURE) ×4 IMPLANT
TOWEL OR 17X24 6PK STRL BLUE (TOWEL DISPOSABLE) ×2 IMPLANT
TOWEL OR 17X26 10 PK STRL BLUE (TOWEL DISPOSABLE) ×2 IMPLANT
WATER STERILE IRR 1000ML POUR (IV SOLUTION) ×2 IMPLANT

## 2016-02-13 NOTE — Anesthesia Preprocedure Evaluation (Signed)
Anesthesia Evaluation  Patient identified by MRN, date of birth, ID band Patient awake    Reviewed: Allergy & Precautions, NPO status , Patient's Chart, lab work & pertinent test results  Airway Mallampati: II  TM Distance: >3 FB Neck ROM: Full    Dental  (+) Teeth Intact   Pulmonary neg pulmonary ROS,    breath sounds clear to auscultation       Cardiovascular hypertension, + CAD   Rhythm:Regular Rate:Normal     Neuro/Psych    GI/Hepatic Neg liver ROS, GERD  ,  Endo/Other  Morbid obesity  Renal/GU negative Renal ROS     Musculoskeletal  (+) Arthritis ,   Abdominal (+) + obese,   Peds  Hematology   Anesthesia Other Findings   Reproductive/Obstetrics                             Anesthesia Physical Anesthesia Plan  ASA: III  Anesthesia Plan: General   Post-op Pain Management:    Induction: Intravenous  Airway Management Planned: Oral ETT  Additional Equipment:   Intra-op Plan:   Post-operative Plan: Extubation in OR  Informed Consent: I have reviewed the patients History and Physical, chart, labs and discussed the procedure including the risks, benefits and alternatives for the proposed anesthesia with the patient or authorized representative who has indicated his/her understanding and acceptance.     Plan Discussed with:   Anesthesia Plan Comments:         Anesthesia Quick Evaluation

## 2016-02-13 NOTE — Discharge Summary (Signed)
Physician Discharge Summary  Patient ID: Stephen Medina MRN: [id number] DOB/AGE: 19-Apr-1950 66 y.o.  Admit date: 02/13/2016 Discharge date: 02/13/2016  Admission Diagnoses:recurrent Left L5/S1 HNP  Discharge Diagnoses: Recurrent Left L5/S1 HNP Active Problems:   HNP (herniated nucleus pulposus), lumbar   Discharged Condition: good  Hospital Course: Mr. Fisch was admitted and taken to the operating room for an uncomplicated redo left [id number] discetomy. Post op he is alert, voiding, tolerating a regular diet, and ambulating. His wound is clean, dry, and without signs of infection. He is moving all extremities quite well.  Treatments: surgery: as above  Discharge Exam: Blood pressure 138/89, pulse 65, temperature 97.6 F (36.4 C), resp. rate 20, height 6' (1.829 m), weight (!) 149.7 kg (330 lb), SpO2 99 %. General appearance: alert, cooperative, appears stated age and morbidly obese Neurologic: Alert and oriented X 3, normal strength and tone. Normal symmetric reflexes. Normal coordination and gait  Disposition: 01-Home or Self Care DISC DISPLACEMENT, LUMBAR  Allergies as of 02/13/2016      Reactions   Adhesive [tape] Other (See Comments)   Blisters.  Paper tape ok.      Medication List    TAKE these medications   aspirin 325 MG tablet Take 325 mg by mouth daily.   clopidogrel 75 MG tablet Commonly known as:  PLAVIX Take 75 mg by mouth daily.   isosorbide mononitrate 30 MG 24 hr tablet Commonly known as:  IMDUR Take 30 mg by mouth daily.   losartan-hydrochlorothiazide 100-12.5 MG tablet Commonly known as:  HYZAAR Take 1 tablet by mouth daily.   metoprolol succinate 25 MG 24 hr tablet Commonly known as:  TOPROL-XL Take 25 mg by mouth daily.   oxyCODONE-acetaminophen 5-325 MG tablet Commonly known as:  ROXICET Take 1-2 tablets by mouth every 6 (six) hours as needed for severe pain. What changed:  Another medication with the same name was added. Make sure you  understand how and when to take each.   oxyCODONE-acetaminophen 5-325 MG tablet Commonly known as:  ROXICET Take 1 tablet by mouth every 6 (six) hours as needed for severe pain. What changed:  You were already taking a medication with the same name, and this prescription was added. Make sure you understand how and when to take each.   rosuvastatin 40 MG tablet Commonly known as:  CRESTOR Take 20 mg by mouth daily.   tiZANidine 4 MG tablet Commonly known as:  ZANAFLEX Take 1 tablet (4 mg total) by mouth every 6 (six) hours as needed for muscle spasms.      Follow-up Information    Arwa Yero L, MD Follow up in 3 week(s).   Specialty:  Neurosurgery Why:  please call to make an appointment  Contact information: [address] [place] [address] [phone]           Signed: Winfield Cunas 02/13/2016, 7:03 PM

## 2016-02-13 NOTE — Op Note (Signed)
02/13/2016  12:05 PM  PATIENT:  Stephen Medina  66 y.o. 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  BLOOD ADMINISTERED:none  CELL SAVER GIVEN:none  COUNT:per nursing  DRAINS: none   SPECIMEN:  No Specimen  DICTATION: Mr. [name] was taken to the operating room, intubated and placed under a general anesthetic without difficulty. He was positioned prone on a Wilson frame with all pressure points padded. His back was prepped and draped in a sterile manner. I opened the skin with a 10 blade and carried the dissection down to the thoracolumbar fascia. I used both sharp dissection and the monopolar cautery to expose the lamina of 5, and S1.  I used the drill, Kerrison punches, and curettes to perform a semihemilaminectomy of L5, and S1. I used curved curettes to dissect the scar tissue and to free the thecal sac. Dr. Saintclair Halsted assisted with the dissection.I used the punches to remove the ligamentum flavum to expose the thecal sac. I brought the microscope into the operative field and with Dr.Cram's assistance we started our decompression of the spinal canal, thecal sac and Left S1 root(s). I cauterized epidural veins overlying the disc space then divided them sharply. I opened the disc space with a 11 blade and proceeded with the discectomy. I used pituitary rongeurs, curettes, and other instruments to remove disc material. After the discectomy was completed we inspected the S1 nerve root and felt it was well decompressed. I explored rostrally, laterally, medially, and caudally and was satisfied with the decompression. I irrigated the wound, then closed in layers. I approximated the  thoracolumbar fascia, subcutaneous, and subcuticular planes with vicryl sutures.I approximated the skin edges with a running nylon suture. I used an occlusive bandage for a sterile dressing.   PLAN OF CARE: Admit for overnight observation  PATIENT DISPOSITION:  PACU - hemodynamically stable.   Delay start of Pharmacological VTE agent (>24hrs) due to surgical blood loss or risk of bleeding:  yes

## 2016-02-13 NOTE — H&P (Signed)
BP 136/84   Pulse 65   Temp 97.8 F (36.6 C) (Oral)   Resp 20   Ht 6' (1.829 m)   Wt (!) 149.7 kg (330 lb)   SpO2 97%   BMI 44.76 kg/m  Stephen Medina returns today after undergoing a lumbar diskectomy on the left side at L5-S1 in August of this year. He had his operation and did extraordinarily well, and continued to do well until a few months ago. He had a recurrence of his pain in his back and left lower extremity. No discomfort on the right side. He comes in today hobbling again, so to speak. He is on a cane.  Stephen Medina returns now with the MRI. He had been complaining of being without pain and having a sharp recurrence of the pain exactly as it had been.  DATA: MRI today shows what appears to be a recurrence on the left side at L5-S1. It certainly enhances with contrast, but his pain is such that I just have to believe that there is a disk recurrence. I don't think it is simply due to scar tissue.  Allergies  Allergen Reactions  . Adhesive [Tape] Other (See Comments)    Blisters.  Paper tape ok.   Prior to Admission medications   Medication Sig Start Date End Date Taking? Authorizing Provider  isosorbide mononitrate (IMDUR) 30 MG 24 hr tablet Take 30 mg by mouth daily.   Yes Historical Provider, MD  losartan-hydrochlorothiazide (HYZAAR) 100-12.5 MG tablet Take 1 tablet by mouth daily. 08/16/15  Yes Historical Provider, MD  metoprolol succinate (TOPROL-XL) 25 MG 24 hr tablet Take 25 mg by mouth daily.  04/07/14  Yes Historical Provider, MD  rosuvastatin (CRESTOR) 40 MG tablet Take 20 mg by mouth daily.    Yes Historical Provider, MD  aspirin 325 MG tablet Take 325 mg by mouth daily.     Historical Provider, MD  clopidogrel (PLAVIX) 75 MG tablet Take 75 mg by mouth daily.    Historical Provider, MD  oxyCODONE-acetaminophen (ROXICET) 5-325 MG tablet Take 1-2 tablets by mouth every 6 (six) hours as needed for severe pain. Patient not taking: Reported on 02/08/2016 09/03/15   Ashok Pall, MD   Past  Surgical History:  Procedure Laterality Date  . APPENDECTOMY    . BACK SURGERY    . CARDIAC CATHETERIZATION     05    1 stent   dr Wynonia Lawman  . ESOPHAGOGASTRODUODENOSCOPY N/A 08/02/2013   Procedure: ESOPHAGOGASTRODUODENOSCOPY (EGD);  Surgeon: Ladene Artist, MD;  Location: [facility] ENDOSCOPY;  Service: Endoscopy;  Laterality: N/A;  . FOOT SURGERY    . GRAFT APPLICATION N/A [id number]   Procedure: INTEGRA GRAFT APPLICATION;  Surgeon: Trula Slade, DPM;  Location: [place];  Service: Podiatry;  Laterality: N/A;  . LEFT HEART CATHETERIZATION WITH CORONARY ANGIOGRAM N/A 02/09/2014   Procedure: LEFT HEART CATHETERIZATION WITH CORONARY ANGIOGRAM;  Surgeon: Peter M Martinique, MD;  Location: [facility] CATH LAB;  Service: Cardiovascular;  Laterality: N/A;  . LUMBAR LAMINECTOMY     x2  . LUMBAR LAMINECTOMY/DECOMPRESSION MICRODISCECTOMY Left 09/03/2015   Procedure: MICRODISCECTOMY L5-S1 LEFT;  Surgeon: Ashok Pall, MD;  Location: [place] NEURO ORS;  Service: Neurosurgery;  Laterality: Left;   MICRODISCECTOMY L5-S1 LEFT  . TONSILLECTOMY    . WOUND DEBRIDEMENT N/A 06/22/2015   Procedure: DEBRIDEMENT WOUND;  Surgeon: Trula Slade, DPM;  Location: [place];  Service: Podiatry;  Laterality: N/A;   Past Medical History:  Diagnosis Date  .  CAD (coronary artery disease) 08/01/2013   Cath 2005  normal Left main, scattered irregularities LAD, 80% stenosis ostial Diag 1, scattered irregularities CFX, scattered irregularities RCA  Taxus stent March 2005 Diagonal   . Disc displacement, lumbar   . Foot ulceration ([place])    left  . GERD (gastroesophageal reflux disease)   . History of ulcer disease 10/19/2013  . Hyperlipidemia   . Hypertension   . Lumbar spondylosis 04/01/2013  . Morbid obesity ([place])   . Wears glasses    Family History  Problem Relation Age of Onset  . Coronary artery disease Father     open heart surgery   . Cancer Brother   . CAD Cousin     requiring stent  . Colon  cancer Neg Hx   . Pancreatic cancer Neg Hx   . Rectal cancer Neg Hx   . Stomach cancer Neg Hx    Social History   Social History  . Marital status: Married    Spouse name: N/A  . Number of children: N/A  . Years of education: N/A   Occupational History  . Not on file.   Social History Main Topics  . Smoking status: Never Smoker  . Smokeless tobacco: Former Systems developer    Types: Chew  . Alcohol use No  . Drug use: No  . Sexual activity: Not on file   Other Topics Concern  . Not on file   Social History Narrative   Pt lives in [place] with his wife. They have one son who does not live in the area. Pt previously worked for [employer] but is retired. He used to [employer]. He has never smoke and drinks 4-5 alcoholic beverages per week. No illicit drug use.   Physical Exam  Constitutional: He is oriented to person, place, and time. He appears well-developed and well-nourished.  obese  HENT:  Head: Normocephalic and atraumatic.  Right Ear: External ear normal.  Left Ear: External ear normal.  Nose: Nose normal.  Mouth/Throat: Oropharynx is clear and moist.  Eyes: Conjunctivae and EOM are normal. Pupils are equal, round, and reactive to light.  Neck: Normal range of motion. Neck supple.  Cardiovascular: Normal rate, regular rhythm, normal heart sounds and intact distal pulses.   Pulmonary/Chest: Effort normal and breath sounds normal.  Abdominal: Soft. Bowel sounds are normal.  Musculoskeletal: Normal range of motion.  Neurological: He is alert and oriented to person, place, and time. He has normal strength. He displays abnormal reflex. No cranial nerve deficit or sensory deficit. He displays a negative Romberg sign. Coordination normal. He displays no Babinski's sign on the right side. He displays no Babinski's sign on the left side.  Reflex Scores:      Tricep reflexes are 2+ on the right side and 2+ on the left side.      Bicep reflexes are 2+ on the right side  and 2+ on the left side.      Brachioradialis reflexes are 2+ on the right side and 2+ on the left side.      Patellar reflexes are 2+ on the right side and 2+ on the left side.      Achilles reflexes are 2+ on the right side and 0 on the left side. Antalgic gate  Skin: Skin is warm and dry.    IMPRESSION/PLAN: He would like to simply go ahead for a redo diskectomy. I am not going to fuse him. Again, this is a level below. He needs  to get back on his feet quickly and I do believe a simple diskectomy will suffice.

## 2016-02-13 NOTE — Anesthesia Procedure Notes (Signed)
Procedure Name: Intubation Date/Time: 02/13/2016 9:38 AM Performed by: Barrington Ellison Pre-anesthesia Checklist: Patient identified, Emergency Drugs available, Suction available and Patient being monitored Patient Re-evaluated:Patient Re-evaluated prior to inductionOxygen Delivery Method: Circle System Utilized Preoxygenation: Pre-oxygenation with 100% oxygen Intubation Type: IV induction Ventilation: Mask ventilation without difficulty Laryngoscope Size: Glidescope and 3 Grade View: Grade I Tube type: Oral Tube size: 7.5 mm Number of attempts: 1 Airway Equipment and Method: Stylet and Oral airway Placement Confirmation: ETT inserted through vocal cords under direct vision,  positive ETCO2 and breath sounds checked- equal and bilateral Secured at: 23 cm Tube secured with: Tape Dental Injury: Teeth and Oropharynx as per pre-operative assessment  Future Recommendations: Recommend- induction with short-acting agent, and alternative techniques readily available

## 2016-02-13 NOTE — Progress Notes (Signed)
Pt doing well. Pt given D/C instructions with Rx's, verbal understanding was provided. Pt's incision is clean and dry with no sign of infection. Pt's IV was removed prior to D/C. Pt D/C'd home via wheelchair @ 1940 per MD order. Holli Humbles, RN

## 2016-02-13 NOTE — Anesthesia Postprocedure Evaluation (Addendum)
Anesthesia Post Note  Patient: Stephen Medina  Procedure(s) Performed: Procedure(s) (LRB): MICRODISCECTOMY LUMBAR FIVE- SACRAL ONE LEFT (Left)  Patient location during evaluation: PACU Anesthesia Type: General Level of consciousness: awake, sedated and oriented Pain management: pain level controlled Vital Signs Assessment: post-procedure vital signs reviewed and stable Respiratory status: spontaneous breathing, nonlabored ventilation, respiratory function stable and patient connected to nasal cannula oxygen Cardiovascular status: blood pressure returned to baseline and stable Postop Assessment: no signs of nausea or vomiting Anesthetic complications: no       Last Vitals:  Vitals:   02/13/16 1215 02/13/16 1245  BP:    Pulse:    Resp:  (P) 16  Temp: 36.5 C     Last Pain:  Vitals:   02/13/16 1215  TempSrc:   PainSc: 10-Worst pain ever                 Serafino Burciaga,JAMES TERRILL

## 2016-02-13 NOTE — Transfer of Care (Signed)
Immediate Anesthesia Transfer of Care Note  Patient: Stephen Medina  Procedure(s) Performed: Procedure(s): MICRODISCECTOMY LUMBAR FIVE- SACRAL ONE LEFT (Left)  Patient Location: PACU  Anesthesia Type:General  Level of Consciousness: awake, alert  and oriented  Airway & Oxygen Therapy: Patient Spontanous Breathing and Patient connected to nasal cannula oxygen  Post-op Assessment: Report given to RN and Patient moving all extremities X 4  Post vital signs: Reviewed and stable  Last Vitals:  Vitals:   02/13/16 0701  BP: 136/84  Pulse: 65  Resp: 20  Temp: 36.6 C    Last Pain:  Vitals:   02/13/16 0735  TempSrc:   PainSc: 5       Patients Stated Pain Goal: 6 ([phone])  Complications: No apparent anesthesia complications

## 2016-02-13 NOTE — Discharge Instructions (Signed)
Lumbar Discectomy °Care After °A discectomy involves removal of discmaterial (the cartilage-like structures located between the bones of the back). It is done to relieve pressure on nerve roots. It can be used as a treatment for a back problem. The time in surgery depends on the findings in surgery and what is necessary to correct the problems. °HOME CARE INSTRUCTIONS  °· Check the cut (incision) made by the surgeon twice a day for signs of infection. Some signs of infection may include:  °· A foul smelling, greenish or yellowish discharge from the wound.  °· Increased pain.  °· Increased redness over the incision (operative) site.  °· The skin edges may separate.  °· Flu-like symptoms (problems).  °· A temperature above 101.5° F (38.6° C).  °· Change your bandages in about 24 to 36 hours following surgery or as directed.  °· You may shower tomrrow.  Avoid bathtubs, swimming pools and hot tubs for three weeks or until your incision has healed completely. °· Follow your doctor's instructions as to safe activities, exercises, and physical therapy.  °· Weight reduction may be beneficial if you are overweight.  °· Daily exercise is helpful to prevent the return of problems. Walking is permitted. You may use a treadmill without an incline. Cut down on activities and exercise if you have discomfort. You may also go up and down stairs as much as you can tolerate.  °· DO NOT lift anything heavier than 10 to 15 lbs. Avoid bending or twisting at the waist. Always bend your knees when lifting.  °· Maintain strength and range of motion as instructed.  °· Do not drive for 10 days, or as directed by your doctors. You may be a passenger . Lying back in the passenger seat may be more comfortable for you. Always wear a seatbelt.  °· Limit your sitting in a regular chair to 20 to 30 minutes at a time. There are no limitations for sitting in a recliner. You should lie down or walk in between sitting periods.  °· Only take  over-the-counter or prescription medicines for pain, discomfort, or fever as directed by your caregiver.  °SEEK MEDICAL CARE IF:  °· There is increased bleeding (more than a small spot) from the wound.  °· You notice redness, swelling, or increasing pain in the wound.  °· Pus is coming from wound.  °· You develop an unexplained oral temperature above 102° F (38.9° C) develops.  °· You notice a foul smell coming from the wound or dressing.  °· You have increasing pain in your wound.  °SEEK IMMEDIATE MEDICAL CARE IF:  °· You develop a rash.  °· You have difficulty breathing.  °· You develop any allergic problems to medicines given.  °Document Released: 12/05/2003 Document Revised: 12/19/2010 Document Reviewed: 03/25/2007 °ExitCare® Patient Information °

## 2016-02-14 ENCOUNTER — Encounter (HOSPITAL_COMMUNITY): Payer: Self-pay | Admitting: Neurosurgery

## 2016-05-14 ENCOUNTER — Other Ambulatory Visit: Payer: Self-pay | Admitting: Orthopedic Surgery

## 2016-05-16 ENCOUNTER — Encounter (HOSPITAL_BASED_OUTPATIENT_CLINIC_OR_DEPARTMENT_OTHER): Payer: Self-pay | Admitting: *Deleted

## 2016-05-20 ENCOUNTER — Encounter (HOSPITAL_COMMUNITY)
Admission: RE | Admit: 2016-05-20 | Discharge: 2016-05-20 | Disposition: A | Discharge status: Home / Self Care | Payer: Medicare Other | Source: Ambulatory Visit | Attending: Orthopedic Surgery | Admitting: Orthopedic Surgery

## 2016-05-20 DIAGNOSIS — M79672 Pain in left foot: Secondary | ICD-10-CM | POA: Diagnosis not present

## 2016-05-20 DIAGNOSIS — Z8249 Family history of ischemic heart disease and other diseases of the circulatory system: Secondary | ICD-10-CM | POA: Diagnosis not present

## 2016-05-20 DIAGNOSIS — Z79899 Other long term (current) drug therapy: Secondary | ICD-10-CM | POA: Diagnosis not present

## 2016-05-20 DIAGNOSIS — K219 Gastro-esophageal reflux disease without esophagitis: Secondary | ICD-10-CM | POA: Diagnosis not present

## 2016-05-20 DIAGNOSIS — E785 Hyperlipidemia, unspecified: Secondary | ICD-10-CM | POA: Diagnosis not present

## 2016-05-20 DIAGNOSIS — Z6841 Body Mass Index (BMI) 40.0 and over, adult: Secondary | ICD-10-CM | POA: Diagnosis not present

## 2016-05-20 DIAGNOSIS — I2510 Atherosclerotic heart disease of native coronary artery without angina pectoris: Secondary | ICD-10-CM | POA: Diagnosis not present

## 2016-05-20 DIAGNOSIS — Z7982 Long term (current) use of aspirin: Secondary | ICD-10-CM | POA: Diagnosis not present

## 2016-05-20 DIAGNOSIS — Z809 Family history of malignant neoplasm, unspecified: Secondary | ICD-10-CM | POA: Diagnosis not present

## 2016-05-20 DIAGNOSIS — M67874 Other specified disorders of tendon, left ankle and foot: Secondary | ICD-10-CM | POA: Diagnosis present

## 2016-05-20 DIAGNOSIS — M5126 Other intervertebral disc displacement, lumbar region: Secondary | ICD-10-CM | POA: Diagnosis not present

## 2016-05-20 DIAGNOSIS — M62472 Contracture of muscle, left ankle and foot: Secondary | ICD-10-CM | POA: Diagnosis not present

## 2016-05-20 DIAGNOSIS — M76822 Posterior tibial tendinitis, left leg: Secondary | ICD-10-CM | POA: Diagnosis not present

## 2016-05-20 DIAGNOSIS — Z87891 Personal history of nicotine dependence: Secondary | ICD-10-CM | POA: Diagnosis not present

## 2016-05-20 DIAGNOSIS — I10 Essential (primary) hypertension: Secondary | ICD-10-CM | POA: Diagnosis not present

## 2016-05-20 LAB — BASIC METABOLIC PANEL
Anion gap: 11 (ref 5–15)
BUN: 15 mg/dL (ref 6–20)
CO2: 22 mmol/L (ref 22–32)
Calcium: 9 mg/dL (ref 8.9–10.3)
Chloride: 102 mmol/L (ref 101–111)
Creatinine, Ser: 0.79 mg/dL (ref 0.61–1.24)
GFR calc Af Amer: >60 mL/min (ref >60)
GFR calc non Af Amer: >60 mL/min (ref >60)
Glucose, Bld: 88 mg/dL (ref 65–99)
Potassium: 3.9 mmol/L (ref 3.5–5.1)
Sodium: 135 mmol/L (ref 135–145)

## 2016-05-22 ENCOUNTER — Encounter (HOSPITAL_BASED_OUTPATIENT_CLINIC_OR_DEPARTMENT_OTHER): Payer: Self-pay

## 2016-05-22 ENCOUNTER — Ambulatory Visit (HOSPITAL_BASED_OUTPATIENT_CLINIC_OR_DEPARTMENT_OTHER): Payer: Medicare Other | Admitting: Anesthesiology

## 2016-05-22 ENCOUNTER — Encounter (HOSPITAL_BASED_OUTPATIENT_CLINIC_OR_DEPARTMENT_OTHER)
Admission: RE | Disposition: A | Discharge status: Home / Self Care | Payer: Self-pay | Source: Ambulatory Visit | Attending: Orthopedic Surgery

## 2016-05-22 ENCOUNTER — Ambulatory Visit (HOSPITAL_BASED_OUTPATIENT_CLINIC_OR_DEPARTMENT_OTHER)
Admission: RE | Admit: 2016-05-22 | Discharge: 2016-05-22 | Disposition: A | Discharge status: Home / Self Care | Payer: Medicare Other | Source: Ambulatory Visit | Attending: Orthopedic Surgery | Admitting: Orthopedic Surgery

## 2016-05-22 DIAGNOSIS — I2510 Atherosclerotic heart disease of native coronary artery without angina pectoris: Secondary | ICD-10-CM | POA: Insufficient documentation

## 2016-05-22 DIAGNOSIS — Z79899 Other long term (current) drug therapy: Secondary | ICD-10-CM | POA: Insufficient documentation

## 2016-05-22 DIAGNOSIS — M76822 Posterior tibial tendinitis, left leg: Secondary | ICD-10-CM | POA: Insufficient documentation

## 2016-05-22 DIAGNOSIS — M62472 Contracture of muscle, left ankle and foot: Secondary | ICD-10-CM | POA: Diagnosis not present

## 2016-05-22 DIAGNOSIS — M5126 Other intervertebral disc displacement, lumbar region: Secondary | ICD-10-CM | POA: Insufficient documentation

## 2016-05-22 DIAGNOSIS — K219 Gastro-esophageal reflux disease without esophagitis: Secondary | ICD-10-CM | POA: Insufficient documentation

## 2016-05-22 DIAGNOSIS — M79672 Pain in left foot: Secondary | ICD-10-CM | POA: Diagnosis not present

## 2016-05-22 DIAGNOSIS — E785 Hyperlipidemia, unspecified: Secondary | ICD-10-CM | POA: Insufficient documentation

## 2016-05-22 DIAGNOSIS — Z8249 Family history of ischemic heart disease and other diseases of the circulatory system: Secondary | ICD-10-CM | POA: Insufficient documentation

## 2016-05-22 DIAGNOSIS — Z87891 Personal history of nicotine dependence: Secondary | ICD-10-CM | POA: Insufficient documentation

## 2016-05-22 DIAGNOSIS — Z7982 Long term (current) use of aspirin: Secondary | ICD-10-CM | POA: Insufficient documentation

## 2016-05-22 DIAGNOSIS — I10 Essential (primary) hypertension: Secondary | ICD-10-CM | POA: Insufficient documentation

## 2016-05-22 DIAGNOSIS — Z809 Family history of malignant neoplasm, unspecified: Secondary | ICD-10-CM | POA: Insufficient documentation

## 2016-05-22 DIAGNOSIS — Z6841 Body Mass Index (BMI) 40.0 and over, adult: Secondary | ICD-10-CM | POA: Insufficient documentation

## 2016-05-22 HISTORY — PX: FOOT ARTHRODESIS: SHX1655

## 2016-05-22 HISTORY — PX: GASTROCNEMIUS RECESSION: SHX863

## 2016-05-22 SURGERY — RECESSION, MUSCLE, GASTROCNEMIUS
Anesthesia: General | Site: Leg Lower | Laterality: Left

## 2016-05-22 MED ORDER — MIDAZOLAM HCL 2 MG/2ML IJ SOLN
1.0000 mg | INTRAMUSCULAR | Status: DC | PRN
  Administered 2016-05-22: 2 mg via INTRAVENOUS

## 2016-05-22 MED ORDER — CEFAZOLIN SODIUM-DEXTROSE 2-4 GM/100ML-% IV SOLN: 2.0000 g | INTRAVENOUS | Status: DC

## 2016-05-22 MED ORDER — CEFAZOLIN SODIUM-DEXTROSE 2-4 GM/100ML-% IV SOLN
INTRAVENOUS | Status: AC
  Filled 2016-05-22: qty 100

## 2016-05-22 MED ORDER — FENTANYL CITRATE (PF) 100 MCG/2ML IJ SOLN
INTRAMUSCULAR | Status: AC
  Filled 2016-05-22: qty 2

## 2016-05-22 MED ORDER — FENTANYL CITRATE (PF) 100 MCG/2ML IJ SOLN
50.0000 ug | INTRAMUSCULAR | Status: DC | PRN
  Administered 2016-05-22: 50 ug via INTRAVENOUS

## 2016-05-22 MED ORDER — BUPIVACAINE-EPINEPHRINE (PF) 0.5% -1:200000 IJ SOLN
INTRAMUSCULAR | Status: DC | PRN
Start: 2016-05-22 — End: 2016-05-22
  Administered 2016-05-22: 30 mL via PERINEURAL

## 2016-05-22 MED ORDER — VANCOMYCIN HCL 500 MG IV SOLR
INTRAVENOUS | Status: AC
  Filled 2016-05-22: qty 500

## 2016-05-22 MED ORDER — ONDANSETRON HCL 4 MG/2ML IJ SOLN
INTRAMUSCULAR | Status: AC
  Filled 2016-05-22: qty 22

## 2016-05-22 MED ORDER — CHLORHEXIDINE GLUCONATE 4 % EX LIQD: 60.0000 mL | Freq: Once | CUTANEOUS | Status: DC

## 2016-05-22 MED ORDER — OXYCODONE HCL 5 MG PO TABS: 5.0000 mg | ORAL_TABLET | ORAL | 0 refills | Status: DC | PRN

## 2016-05-22 MED ORDER — SENNA 8.6 MG PO TABS: 2.0000 | ORAL_TABLET | Freq: Two times a day (BID) | ORAL | 0 refills | Status: DC

## 2016-05-22 MED ORDER — PROMETHAZINE HCL 25 MG/ML IJ SOLN: 6.2500 mg | INTRAMUSCULAR | Status: DC | PRN

## 2016-05-22 MED ORDER — LIDOCAINE HCL (CARDIAC) 20 MG/ML IV SOLN
INTRAVENOUS | Status: DC | PRN
  Administered 2016-05-22: 3 mg via INTRAVENOUS

## 2016-05-22 MED ORDER — ROPIVACAINE HCL 5 MG/ML IJ SOLN
INTRAMUSCULAR | Status: DC | PRN
Start: 2016-05-22 — End: 2016-05-22
  Administered 2016-05-22: 20 mL via PERINEURAL

## 2016-05-22 MED ORDER — HYDROMORPHONE HCL 1 MG/ML IJ SOLN: 0.2500 mg | INTRAMUSCULAR | Status: DC | PRN

## 2016-05-22 MED ORDER — PROPOFOL 10 MG/ML IV BOLUS
INTRAVENOUS | Status: DC | PRN
  Administered 2016-05-22: 200 mg via INTRAVENOUS

## 2016-05-22 MED ORDER — LIDOCAINE 2% (20 MG/ML) 5 ML SYRINGE
INTRAMUSCULAR | Status: AC
  Filled 2016-05-22: qty 25

## 2016-05-22 MED ORDER — MIDAZOLAM HCL 2 MG/2ML IJ SOLN
INTRAMUSCULAR | Status: AC
  Filled 2016-05-22: qty 2

## 2016-05-22 MED ORDER — EPHEDRINE SULFATE-NACL 50-0.9 MG/10ML-% IV SOSY
PREFILLED_SYRINGE | INTRAVENOUS | Status: DC | PRN
  Administered 2016-05-22 (×2): 5 mg via INTRAVENOUS

## 2016-05-22 MED ORDER — ONDANSETRON HCL 4 MG/2ML IJ SOLN
INTRAMUSCULAR | Status: DC | PRN
  Administered 2016-05-22: 4 mg via INTRAVENOUS

## 2016-05-22 MED ORDER — SCOPOLAMINE 1 MG/3DAYS TD PT72: 1.0000 | MEDICATED_PATCH | Freq: Once | TRANSDERMAL | Status: DC | PRN

## 2016-05-22 MED ORDER — VANCOMYCIN HCL 500 MG IV SOLR
INTRAVENOUS | Status: DC | PRN
  Administered 2016-05-22: 500 mg via TOPICAL

## 2016-05-22 MED ORDER — SODIUM CHLORIDE 0.9 % IV SOLN
INTRAVENOUS | Status: DC
Start: 2016-05-22 — End: 2016-05-22

## 2016-05-22 MED ORDER — DEXAMETHASONE SODIUM PHOSPHATE 10 MG/ML IJ SOLN
INTRAMUSCULAR | Status: AC
  Filled 2016-05-22: qty 5

## 2016-05-22 MED ORDER — DOCUSATE SODIUM 100 MG PO CAPS: 100.0000 mg | ORAL_CAPSULE | Freq: Two times a day (BID) | ORAL | 0 refills | Status: DC

## 2016-05-22 MED ORDER — CEFAZOLIN SODIUM 10 G IJ SOLR
3.0000 g | Freq: Once | INTRAMUSCULAR | Status: AC
  Administered 2016-05-22: 3 g via INTRAVENOUS

## 2016-05-22 MED ORDER — LACTATED RINGERS IV SOLN
INTRAVENOUS | Status: DC
  Administered 2016-05-22 (×2): via INTRAVENOUS

## 2016-05-22 MED ORDER — 0.9 % SODIUM CHLORIDE (POUR BTL) OPTIME
TOPICAL | Status: DC | PRN
Start: 2016-05-22 — End: 2016-05-22
  Administered 2016-05-22: 350 mL

## 2016-05-22 MED ORDER — PROPOFOL 500 MG/50ML IV EMUL
INTRAVENOUS | Status: AC
  Filled 2016-05-22: qty 100

## 2016-05-22 MED ORDER — DEXAMETHASONE SODIUM PHOSPHATE 10 MG/ML IJ SOLN
INTRAMUSCULAR | Status: DC | PRN
  Administered 2016-05-22: 10 mg via INTRAVENOUS

## 2016-05-22 MED ORDER — CEFAZOLIN SODIUM-DEXTROSE 1-4 GM/50ML-% IV SOLN
INTRAVENOUS | Status: AC
  Filled 2016-05-22: qty 50

## 2016-05-22 SURGICAL SUPPLY — 81 items
BANDAGE ESMARK 6X9 LF (GAUZE/BANDAGES/DRESSINGS) IMPLANT
BIT DRILL 4.8X200 CANN (BIT) ×3 IMPLANT
BLADE AVERAGE 25X9 (BLADE) IMPLANT
BLADE MICRO SAGITTAL (BLADE) IMPLANT
BLADE SURG 15 STRL LF DISP TIS (BLADE) ×4 IMPLANT
BLADE SURG 15 STRL SS (BLADE) ×2
BNDG COHESIVE 4X5 TAN STRL (GAUZE/BANDAGES/DRESSINGS) ×3 IMPLANT
BNDG COHESIVE 6X5 TAN STRL LF (GAUZE/BANDAGES/DRESSINGS) ×3 IMPLANT
BNDG ESMARK 6X9 LF (GAUZE/BANDAGES/DRESSINGS)
BRUSH SCRUB EZ PLAIN DRY (MISCELLANEOUS) ×3 IMPLANT
CHLORAPREP W/TINT 26ML (MISCELLANEOUS) IMPLANT
COVER BACK TABLE 60X90IN (DRAPES) ×3 IMPLANT
CUFF TOURNIQUET SINGLE 34IN LL (TOURNIQUET CUFF) IMPLANT
CUFF TOURNIQUET SINGLE 44IN (TOURNIQUET CUFF) ×3 IMPLANT
DRAPE EXTREMITY T 121X128X90 (DRAPE) ×3 IMPLANT
DRAPE INCISE IOBAN 66X45 STRL (DRAPES) ×3 IMPLANT
DRAPE OEC MINIVIEW 54X84 (DRAPES) ×3 IMPLANT
DRAPE U-SHAPE 47X51 STRL (DRAPES) ×3 IMPLANT
DRSG MEPITEL 4X7.2 (GAUZE/BANDAGES/DRESSINGS) ×3 IMPLANT
DRSG PAD ABDOMINAL 8X10 ST (GAUZE/BANDAGES/DRESSINGS) ×6 IMPLANT
ELECT REM PT RETURN 9FT ADLT (ELECTROSURGICAL) ×3
ELECTRODE REM PT RTRN 9FT ADLT (ELECTROSURGICAL) ×2 IMPLANT
GAUZE SPONGE 4X4 12PLY STRL (GAUZE/BANDAGES/DRESSINGS) ×3 IMPLANT
GLOVE BIO SURGEON STRL SZ8 (GLOVE) ×3 IMPLANT
GLOVE BIOGEL PI IND STRL 7.0 (GLOVE) ×4 IMPLANT
GLOVE BIOGEL PI IND STRL 8 (GLOVE) ×4 IMPLANT
GLOVE BIOGEL PI INDICATOR 7.0 (GLOVE) ×2
GLOVE BIOGEL PI INDICATOR 8 (GLOVE) ×2
GLOVE ECLIPSE 6.5 STRL STRAW (GLOVE) ×3 IMPLANT
GLOVE ECLIPSE 8.0 STRL XLNG CF (GLOVE) ×3 IMPLANT
GOWN STRL REUS W/ TWL LRG LVL3 (GOWN DISPOSABLE) ×2 IMPLANT
GOWN STRL REUS W/ TWL XL LVL3 (GOWN DISPOSABLE) ×4 IMPLANT
GOWN STRL REUS W/TWL LRG LVL3 (GOWN DISPOSABLE) ×1
GOWN STRL REUS W/TWL XL LVL3 (GOWN DISPOSABLE) ×2
K-WIRE .062X4 (WIRE) IMPLANT
K-WIRE 1.8 (WIRE)
K-WIRE 9  SMOOTH .062 (WIRE) ×3 IMPLANT
K-WIRE FX200X1.8XTROC TIP (WIRE)
KIT ARCUS SIZING TEMPLATE STRL (KITS) ×3 IMPLANT
KIT STAPLE ARCUS 20X17 STRL (Staple) ×3 IMPLANT
KWIRE FX200X1.8XTROC TIP (WIRE) IMPLANT
NDL SAFETY ECLIPSE 18X1.5 (NEEDLE) IMPLANT
NEEDLE HYPO 18GX1.5 SHARP (NEEDLE)
NEEDLE HYPO 22GX1.5 SAFETY (NEEDLE) ×3 IMPLANT
NEEDLE HYPO 25X1 1.5 SAFETY (NEEDLE) IMPLANT
NS IRRIG 1000ML POUR BTL (IV SOLUTION) ×3 IMPLANT
PACK BASIN DAY SURGERY FS (CUSTOM PROCEDURE TRAY) ×3 IMPLANT
PAD CAST 4YDX4 CTTN HI CHSV (CAST SUPPLIES) ×2 IMPLANT
PADDING CAST ABS 4INX4YD NS (CAST SUPPLIES)
PADDING CAST ABS COTTON 4X4 ST (CAST SUPPLIES) IMPLANT
PADDING CAST COTTON 4X4 STRL (CAST SUPPLIES) ×1
PADDING CAST COTTON 6X4 STRL (CAST SUPPLIES) ×3 IMPLANT
PENCIL BUTTON HOLSTER BLD 10FT (ELECTRODE) ×3 IMPLANT
PIN GUIDE DRILL TIP 2.8X300 (DRILL) ×6 IMPLANT
SANITIZER HAND PURELL 535ML FO (MISCELLANEOUS) ×3 IMPLANT
SCREW CANN 5.0X30MM (Screw) ×3 IMPLANT
SCREW FT CANN 8.0X60MM (Screw) ×3 IMPLANT
SCREW PARTIAL THREAD 8.0X90MM (Screw) ×3 IMPLANT
SHEET MEDIUM DRAPE 40X70 STRL (DRAPES) ×3 IMPLANT
SLEEVE SCD COMPRESS KNEE MED (MISCELLANEOUS) ×3 IMPLANT
SPLINT FAST PLASTER 5X30 (CAST SUPPLIES) ×20
SPLINT PLASTER CAST FAST 5X30 (CAST SUPPLIES) ×40 IMPLANT
SPONGE LAP 18X18 X RAY DECT (DISPOSABLE) ×3 IMPLANT
SPONGE SURGIFOAM ABS GEL 12-7 (HEMOSTASIS) IMPLANT
STAPLE ASSEMBLY ARCUS 20X17 (Staple) ×3 IMPLANT
STOCKINETTE 6  STRL (DRAPES) ×1
STOCKINETTE 6 STRL (DRAPES) ×2 IMPLANT
SUCTION FRAZIER HANDLE 10FR (MISCELLANEOUS) ×1
SUCTION TUBE FRAZIER 10FR DISP (MISCELLANEOUS) ×2 IMPLANT
SUT ETHILON 3 0 PS 1 (SUTURE) ×9 IMPLANT
SUT MNCRL AB 3-0 PS2 18 (SUTURE) ×3 IMPLANT
SUT VIC AB 0 SH 27 (SUTURE) IMPLANT
SUT VIC AB 2-0 SH 27 (SUTURE) ×1
SUT VIC AB 2-0 SH 27XBRD (SUTURE) ×2 IMPLANT
SYR BULB 3OZ (MISCELLANEOUS) ×3 IMPLANT
SYR CONTROL 10ML LL (SYRINGE) IMPLANT
TOWEL OR 17X24 6PK STRL BLUE (TOWEL DISPOSABLE) ×6 IMPLANT
TRAY DSU PREP LF (CUSTOM PROCEDURE TRAY) ×3 IMPLANT
TUBE CONNECTING 20X1/4 (TUBING) ×3 IMPLANT
UNDERPAD 30X30 (UNDERPADS AND DIAPERS) ×3 IMPLANT
YANKAUER SUCT BULB TIP NO VENT (SUCTIONS) IMPLANT

## 2016-05-22 NOTE — Brief Op Note (Signed)
05/22/2016  10:56 AM  PATIENT:  Stephen Medina  66 y.o. male  PRE-OPERATIVE DIAGNOSIS:  Left posterior tibial tendon dysfunction, stage 3, tight heelcord  POST-OPERATIVE DIAGNOSIS:  Left posterior tibial tendon dysfunction, stage 3, tight heelcord  Procedure(s): 1.  Left Gastroc Recession (separate incision) 2.  Left Subtalar and Talonavicular Joint Arthrodesis (separate incisions) 3.  Left foot AP, lateral and Harris Heel xrays  SURGEON:  Wylene Simmer, MD  ASSISTANT: Mechele Claude, PA-C  ANESTHESIA:   General, regional  EBL:  minimal   TOURNIQUET:   Total Tourniquet Time Documented: Thigh (Left) - 87 minutes Total: Thigh (Left) - 87 minutes  COMPLICATIONS:  None apparent  DISPOSITION:  Extubated, awake and stable to recovery.  DICTATION ID:  [id number]

## 2016-05-22 NOTE — H&P (Signed)
Stephen Medina is an 66 y.o. male.   Chief Complaint:  Left foot pain HPI: 66 y/o male with left fixed flatfoot deformity from posterior tibial tendon dysfunction.  He also has tight heelcord.  He has failed non op treatment and presents today for subtalar and talonavicular joint arthrodesis with gastroc recession.  Past Medical History:  Diagnosis Date  . CAD (coronary artery disease) 08/01/2013   Cath 2005  normal Left main, scattered irregularities LAD, 80% stenosis ostial Diag 1, scattered irregularities CFX, scattered irregularities RCA  Taxus stent March 2005 Diagonal   . Disc displacement, lumbar   . Foot ulceration ([place])    left  . GERD (gastroesophageal reflux disease)   . History of ulcer disease 10/19/2013  . Hyperlipidemia   . Hypertension   . Lumbar spondylosis 04/01/2013  . Morbid obesity ([place])   . Wears glasses     Past Surgical History:  Procedure Laterality Date  . APPENDECTOMY    . BACK SURGERY    . CARDIAC CATHETERIZATION     05    1 stent   dr Wynonia Lawman  . ESOPHAGOGASTRODUODENOSCOPY N/A 08/02/2013   Procedure: ESOPHAGOGASTRODUODENOSCOPY (EGD);  Surgeon: Ladene Artist, MD;  Location[facility] ENDOSCOPY;  Service: Endoscopy;  Laterality: N/A;  . FOOT SURGERY    . GRAFT APPLICATION N/A 0/06/3014   Procedure: INTEGRA GRAFT APPLICATION;  Surgeon: Trula Slade, DPM;  Location: [place];  Service: Podiatry;  Laterality: N/A;  . LEFT HEART CATHETERIZATION WITH CORONARY ANGIOGRAM N/A 02/09/2014   Procedure: LEFT HEART CATHETERIZATION WITH CORONARY ANGIOGRAM;  Surgeon: Peter M Martinique, MD;  Location: [facility] CATH LAB;  Service: Cardiovascular;  Laterality: N/A;  . LUMBAR LAMINECTOMY     x2  . LUMBAR LAMINECTOMY/DECOMPRESSION MICRODISCECTOMY Left 09/03/2015   Procedure: MICRODISCECTOMY L5-S1 LEFT;  Surgeon: Ashok Pall, MD;  Location: [place] NEURO ORS;  Service: Neurosurgery;  Laterality: Left;   MICRODISCECTOMY L5-S1 LEFT  . LUMBAR LAMINECTOMY/DECOMPRESSION  MICRODISCECTOMY Left 02/13/2016   Procedure: MICRODISCECTOMY LUMBAR FIVE- SACRAL ONE LEFT;  Surgeon: Ashok Pall, MD;  Location: [place];  Service: Neurosurgery;  Laterality: Left;  . TONSILLECTOMY    . WOUND DEBRIDEMENT N/A 06/22/2015   Procedure: DEBRIDEMENT WOUND;  Surgeon: Trula Slade, DPM;  Location: [place];  Service: Podiatry;  Laterality: N/A;    Family History  Problem Relation Age of Onset  . Coronary artery disease Father        open heart surgery   . Cancer Brother   . CAD Cousin        requiring stent  . Colon cancer Neg Hx   . Pancreatic cancer Neg Hx   . Rectal cancer Neg Hx   . Stomach cancer Neg Hx    Social History:  reports that he has never smoked. He has quit using smokeless tobacco. His smokeless tobacco use included Chew. He reports that he does not drink alcohol or use drugs.  Allergies:  Allergies  Allergen Reactions  . Adhesive [Tape] Other (See Comments)    Blisters.  Paper tape ok.    Medications Prior to Admission  Medication Sig Dispense Refill  . aspirin 325 MG tablet Take 325 mg by mouth daily.     . clopidogrel (PLAVIX) 75 MG tablet Take 75 mg by mouth daily.    . isosorbide mononitrate (IMDUR) 30 MG 24 hr tablet Take 30 mg by mouth daily.    Marland Kitchen losartan-hydrochlorothiazide (HYZAAR) 100-12.5 MG tablet Take 1 tablet by mouth daily.  1  . metoprolol succinate (TOPROL-XL) 25 MG 24 hr tablet Take 25 mg by mouth daily.   3  . rosuvastatin (CRESTOR) 40 MG tablet Take 20 mg by mouth daily.     Marland Kitchen tiZANidine (ZANAFLEX) 4 MG tablet Take 1 tablet (4 mg total) by mouth every 6 (six) hours as needed for muscle spasms. 60 tablet 0    Results for orders placed or performed during the hospital encounter of 05/20/16 (from the past 48 hour(s))  Basic metabolic panel     Status: None   Collection Time: 05/20/16 10:39 AM  Result Value Ref Range   Sodium 135 135 - 145 mmol/L   Potassium 3.9 3.5 - 5.1 mmol/L   Chloride 102 101 - 111 mmol/L    CO2 22 22 - 32 mmol/L   Glucose, Bld 88 65 - 99 mg/dL   BUN 15 6 - 20 mg/dL   Creatinine, Ser 0.79 0.61 - 1.24 mg/dL   Calcium 9.0 8.9 - 10.3 mg/dL   GFR calc non Af Amer >60 >60 mL/min   GFR calc Af Amer >60 >60 mL/min    Comment: (NOTE) The eGFR has been calculated using the CKD EPI equation. This calculation has not been validated in all clinical situations. eGFR's persistently <60 mL/min signify possible Chronic Kidney Disease.    Anion gap 11 5 - 15   No results found.  ROS  No recent f/c/n/v/wt loss  Blood pressure (!) 99/59, pulse (!) 53, temperature 97.7 F (36.5 C), temperature source Oral, resp. rate 16, height 6' (1.829 m), weight (!) 145.4 kg (320 lb 8 oz), SpO2 99 %. Physical Exam  Obese male in nad.  A and O x 4.  Mood and affect normal.  EOMI.  resp unlabored.  L foot with plantar ulcer beneath the midfoot.  No signs of infection.  Skin o/w healthy.  No lymphadenopathy.  5/5 strength in PF and DF of the ankle.  Sens to LT intact at the foot.  Fixed flatfoot deformity.  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  To OR for left subtalar and talonavicular arthrodesis and gastroc recession.  The risks and benefits of the alternative treatment options have been discussed in detail.  The patient wishes to proceed with surgery and specifically understands risks of bleeding, infection, nerve damage, blood clots, need for additional surgery, amputation and death.   Wylene Simmer, MD Jun 11, 2016, 8:36 AM

## 2016-05-22 NOTE — Anesthesia Preprocedure Evaluation (Signed)
Anesthesia Evaluation  Patient identified by MRN, date of birth, ID band Patient awake    Reviewed: Allergy & Precautions, NPO status , Patient's Chart, lab work & pertinent test results, reviewed documented beta blocker date and time   Airway Mallampati: III  TM Distance: >3 FB Neck ROM: Full    Dental  (+) Dental Advisory Given   Pulmonary neg pulmonary ROS,    breath sounds clear to auscultation       Cardiovascular hypertension, Pt. on medications and Pt. on home beta blockers + CAD   Rhythm:Regular Rate:Normal     Neuro/Psych negative neurological ROS     GI/Hepatic negative GI ROS, Neg liver ROS,   Endo/Other  negative endocrine ROS  Renal/GU negative Renal ROS     Musculoskeletal  (+) Arthritis ,   Abdominal (+) + obese,   Peds  Hematology negative hematology ROS (+)   Anesthesia Other Findings   Reproductive/Obstetrics                             Anesthesia Physical Anesthesia Plan  ASA: III  Anesthesia Plan: General   Post-op Pain Management:  Regional for Post-op pain   Induction: Intravenous  Airway Management Planned: LMA  Additional Equipment:   Intra-op Plan:   Post-operative Plan: Extubation in OR  Informed Consent: I have reviewed the patients History and Physical, chart, labs and discussed the procedure including the risks, benefits and alternatives for the proposed anesthesia with the patient or authorized representative who has indicated his/her understanding and acceptance.   Dental advisory given  Plan Discussed with:   Anesthesia Plan Comments:         Anesthesia Quick Evaluation

## 2016-05-22 NOTE — Anesthesia Procedure Notes (Signed)
Anesthesia Regional Block: Popliteal block   Pre-Anesthetic Checklist: ,, timeout performed, Correct Patient, Correct Site, Correct Laterality, Correct Procedure, Correct Position, site marked, Risks and benefits discussed,  Surgical consent,  Pre-op evaluation,  At surgeon's request and post-op pain management  Laterality: Left  Prep: chloraprep       Needles:  Injection technique: Single-shot  Needle Type: Echogenic Needle     Needle Length: 9cm  Needle Gauge: 21     Additional Needles:   Procedures: ultrasound guided,,,,,,,,  Narrative:  Start time: 05/22/2016 7:48 AM End time: 05/22/2016 7:56 AM Injection made incrementally with aspirations every 5 mL.  Performed by: Personally  Anesthesiologist: Suzette Battiest

## 2016-05-22 NOTE — Transfer of Care (Signed)
Immediate Anesthesia Transfer of Care Note  Patient: Stephen Medina  Procedure(s) Performed: Procedure(s): Left Gastroc Recession (Left) Left Subtalar and Talonavicular Joint Arthrodesis (Left)  Patient Location: PACU  Anesthesia Type:GA combined with regional for post-op pain  Level of Consciousness: awake, alert , oriented and patient cooperative  Airway & Oxygen Therapy: Patient Spontanous Breathing and Patient connected to face mask oxygen  Post-op Assessment: Report given to RN and Post -op Vital signs reviewed and stable  Post vital signs: Reviewed and stable  Last Vitals:  Vitals:   05/22/16 0805 05/22/16 0810  BP: (!) 89/70 (!) 99/59  Pulse: (!) 53 (!) 53  Resp: 15 16  Temp:      Last Pain:  Vitals:   05/22/16 0721  TempSrc: Oral  PainSc: 5       Patients Stated Pain Goal: 4 (62/83/15 1761)  Complications: No apparent anesthesia complications

## 2016-05-22 NOTE — Anesthesia Procedure Notes (Signed)
Procedure Name: LMA Insertion Date/Time: 05/22/2016 8:57 AM Performed by: Wylee Ogden D Pre-anesthesia Checklist: Patient identified, Emergency Drugs available, Suction available and Patient being monitored Patient Re-evaluated:Patient Re-evaluated prior to inductionOxygen Delivery Method: Circle system utilized Preoxygenation: Pre-oxygenation with 100% oxygen Intubation Type: IV induction Ventilation: Mask ventilation without difficulty LMA: LMA inserted LMA Size: 4.0 Number of attempts: 1 Airway Equipment and Method: Bite block Placement Confirmation: positive ETCO2 Tube secured with: Tape Dental Injury: Teeth and Oropharynx as per pre-operative assessment

## 2016-05-22 NOTE — Discharge Instructions (Addendum)
Stephen Simmer, MD [place]  Please read the following information regarding your care after surgery.  Medications  You only need a prescription for the narcotic pain medicine (ex. oxycodone, Percocet, Norco).  All of the other medicines listed below are available over the counter. X acetominophen (Tylenol) 650 mg every 4-6 hours as you need for minor pain X oxycodone as prescribed for moderate to severe pain X aleve 220 mg - 2 tablets twice daily with food as needed for pain.   Narcotic pain medicine (ex. oxycodone, Percocet, Vicodin) will cause constipation.  To prevent this problem, take the following medicines while you are taking any pain medicine. X docusate sodium (Colace) 100 mg twice a day X senna (Senokot) 2 tablets twice a day  X To help prevent blood clots, resume your aspirin and plavix after surgery.  You should also get up every hour while you are awake to move around.    Weight Bearing X Do not bear any weight on the operated leg or foot.  Cast / Splint / Dressing X Keep your splint or cast clean and dry.  Dont put anything (coat hanger, pencil, etc) down inside of it.  If it gets damp, use a hair dryer on the cool setting to dry it.  If it gets soaked, call the office to schedule an appointment for a cast change.  After your dressing, cast or splint is removed; you may shower, but do not soak or scrub the wound.  Allow the water to run over it, and then gently pat it dry.  Swelling It is normal for you to have swelling where you had surgery.  To reduce swelling and pain, keep your toes above your nose for at least 3 days after surgery.  It may be necessary to keep your foot or leg elevated for several weeks.  If it hurts, it should be elevated.  Follow Up Call my office at [phone] when you are discharged from the hospital or surgery center to schedule an appointment to be seen two weeks after surgery.  Call my office at [phone] if you develop a  fever >101.5 F, nausea, vomiting, bleeding from the surgical site or severe pain.        Regional Anesthesia Blocks  1. Numbness or the inability to move the "blocked" extremity may last from 3-48 hours after placement. The length of time depends on the medication injected and your individual response to the medication. If the numbness is not going away after 48 hours, call your surgeon.  2. The extremity that is blocked will need to be protected until the numbness is gone and the  Strength has returned. Because you cannot feel it, you will need to take extra care to avoid injury. Because it may be weak, you may have difficulty moving it or using it. You may not know what position it is in without looking at it while the block is in effect.  3. For blocks in the legs and feet, returning to weight bearing and walking needs to be done carefully. You will need to wait until the numbness is entirely gone and the strength has returned. You should be able to move your leg and foot normally before you try and bear weight or walk. You will need someone to be with you when you first try to ensure you do not fall and possibly risk injury.  4. Bruising and tenderness at the needle site are common side effects and will resolve in a  few days.  5. Persistent numbness or new problems with movement should be communicated to the surgeon or the [facility] [phone] [place] [phone]).

## 2016-05-22 NOTE — Anesthesia Procedure Notes (Signed)
Anesthesia Regional Block: Adductor canal block   Pre-Anesthetic Checklist: ,, timeout performed, Correct Patient, Correct Site, Correct Laterality, Correct Procedure, Correct Position, site marked, Risks and benefits discussed,  Surgical consent,  Pre-op evaluation,  At surgeon's request and post-op pain management  Laterality: Left  Prep: chloraprep       Needles:  Injection technique: Single-shot  Needle Type: Echogenic Needle     Needle Length: 9cm  Needle Gauge: 21     Additional Needles:   Procedures: ultrasound guided,,,,,,,,  Narrative:  Start time: 05/22/2016 7:56 AM End time: 05/22/2016 8:01 AM Injection made incrementally with aspirations every 5 mL.  Performed by: Personally  Anesthesiologist: Suzette Battiest

## 2016-05-22 NOTE — Progress Notes (Signed)
Assisted Dr. Rob Fitzgerald with left, ultrasound guided, popliteal/saphenous block. Side rails up, monitors on throughout procedure. See vital signs in flow sheet. Tolerated Procedure well. 

## 2016-05-22 NOTE — Anesthesia Postprocedure Evaluation (Signed)
Anesthesia Post Note  Patient: Stephen Medina  Procedure(s) Performed: Procedure(s) (LRB): Left Gastroc Recession (Left) Left Subtalar and Talonavicular Joint Arthrodesis (Left)  Patient location during evaluation: PACU Anesthesia Type: General Level of consciousness: awake and alert Pain management: pain level controlled Vital Signs Assessment: post-procedure vital signs reviewed and stable Respiratory status: spontaneous breathing, nonlabored ventilation, respiratory function stable and patient connected to nasal cannula oxygen Cardiovascular status: blood pressure returned to baseline and stable Postop Assessment: no signs of nausea or vomiting Anesthetic complications: no       Last Vitals:  Vitals:   05/22/16 1143 05/22/16 1230  BP: 125/67 111/66  Pulse: (!) 54 60  Resp: 14 16  Temp: 36.6 C     Last Pain:  Vitals:   05/22/16 1230  TempSrc:   PainSc: 0-No pain                 Tiajuana Amass

## 2016-05-23 ENCOUNTER — Encounter (HOSPITAL_BASED_OUTPATIENT_CLINIC_OR_DEPARTMENT_OTHER): Payer: Self-pay | Admitting: Orthopedic Surgery

## 2016-05-23 NOTE — Op Note (Signed)
NAME:  Stephen Medina, Stephen Medina NO.:  [id number]  MEDICAL RECORD NO.:  [id number]  LOCATION:                                 FACILITY:  PHYSICIAN:  Wylene Simmer, MD             DATE OF BIRTH:  DATE OF PROCEDURE:  05/22/2016 DATE OF DISCHARGE:                              OPERATIVE REPORT   PREOPERATIVE DIAGNOSES: 1. 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 2. Tight left heel cord.  POSTOPERATIVE DIAGNOSES: 1. 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 2. Tight left heel cord.  PROCEDURES: 1. Left gastrocnemius recession through a separate incision. 2. Left subtalar joint arthrodesis. 3. Left talonavicular joint arthrodesis through a separate incision. 4. Left foot AP, lateral and Harris heel radiographs.  SURGEON:  Wylene Simmer, MD  ASSISTANT:  Mechele Claude, PA-C.  ANESTHESIA:  General, regional.  ESTIMATED BLOOD LOSS:  Minimal.  TOURNIQUET TIME:  87 minutes at 250 mmHg.  COMPLICATIONS:  None apparent.  DISPOSITION:  Extubated, awake and stable to recovery.  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  He has an ulcer beneath the midfoot now.  He has failed nonoperative treatment with activity modification, custom orthotics, oral anti-inflammatories and shoe wear modification.  He presents now for operative treatment of this painful condition.  He understands the risks and benefits of the alternative treatment options and elects surgical treatment.  He specifically understands risks of bleeding, infection, nerve damage, blood clots, need for additional surgery, continued pain, nonunion, amputation and death.  PROCEDURE IN DETAIL:  After preoperative consent was obtained and the correct operative site was identified, the patient was brought to the operating room and placed supine on the operating table.  General anesthesia was induced.   Preoperative antibiotics were administered. Surgical time-out was taken.  Left lower extremity was prepped and draped in standard sterile fashion with tourniquet around the thigh. The extremity was exsanguinated and then tourniquet was inflated to 350 mmHg.  A longitudinal incision was made over the medial calf.  Sharp dissection was carried down through the skin and subcutaneous tissue. Care was taken to protect the saphenous nerve and vein.  Superficial fascia was incised.  Gastrocnemius tendon was identified.  The sural nerve was protected and the gastroc tendon was divided from medial to lateral under direct vision.  The patient did not have a plantaris tendon that could be separately identified.  Wound was irrigated copiously.  Vancomycin powder was sprinkled in the wound.  Wound was then closed with Monocryl and nylon.  Attention was then turned to the lateral aspect of the hindfoot.  A curvilinear incision was made over the sinus tarsi and dissection was carried down through the skin and subcutaneous tissue.  The extensor digitorum brevis was elevated and the sinus tarsi was entered.  Cervical ligament was divided.  Lamina spreader was used to open the joint. Articular cartilage and subchondral bone were removed from both sides of the joint with curettes, elevators and rongeurs.  Wound was irrigated copiously.  Both sides of the joint were then  perforated with a small drill bit leaving the resultant bone graft in place.  A 0.25-inch curved osteotome was used to morselize the subchondral bone.  Attention was then turned to the dorsum of the hindfoot where a longitudinal incision was made at the interval between the tibialis anterior and extensor hallucis longus.  Dissection was carried down through the skin and subcutaneous tissue.  Care was taken to protect branches of the superficial peroneal nerve.  Deep neurovascular bundle was mobilized laterally.  The talonavicular joint was  exposed and the joint capsule was elevated medially and laterally.  The subchondral bone and remaining articular cartilage were removed with curettes and rongeurs.  Wound was irrigated.  The drill bit was used to perforate both sides of the joint, again leaving the resultant bone graft in place.  The osteotome was used to break up the subchondral bone.  Attention was then returned to the subtalar joint where it was appropriately reduced.  A stab incision was made at the heel and the guidepin inserted from the calcaneal tuberosity across to the dome of the talus.  Radiographs showed appropriate alignment of the guidepin. It was overdrilled and an 8-mm partially-threaded Biomet cannulated screw was inserted.  It was noted of excellent purchase and compressed the subtalar joint appropriately.  Attention was turned to the dorsal aspect of the talar neck and a guidepin was inserted across the subtalar joint into the anterior process of the calcaneus.  Appropriate location was confirmed on x-rays. The guidepin was overdrilled and an 8-mm fully-threaded screw was inserted.  The talonavicular joint was then reduced and provisionally pinned.  AP and lateral radiographs confirmed appropriate alignment of the joint.  A 5-mm partially-threaded lag screw was inserted across the medial aspect of the talonavicular joint appropriately compressing it.  Two 20-mm staples from Biomet were then inserted across the dorsal aspect of the joint.  AP, lateral and Harris heel radiographs confirmed appropriate position and length of all hardware and appropriate correction of the alignment of the longitudinal arch.  All the wounds were then irrigated copiously and sprinkled with vancomycin powder.  Deep subcutaneous tissues were approximated with Vicryl.  Superficial subcutaneous tissues were closed with Monocryl.  Skin incisions were closed with nylon. Sterile dressings were applied followed by well-padded  short-leg splint. Tourniquet was released after application of the dressings at 87 minutes.  The patient was awakened from anesthesia and transported to the recovery room in stable condition.  FOLLOWUP PLAN:  The patient will be nonweightbearing on the left lower extremity.  He will follow up with me in the office in 2 weeks for suture removal and conversion to a short-leg cast.  He will take aspirin for DVT prophylaxis.  Mechele Claude, PA-C, was present and scrubbed for the duration of the case.  His assistance was essential for positioning the patient, prepping and draping, gaining and maintaining exposure, performing the operation, closing and dressing of the wounds and applying the splint.  RADIOGRAPHS:  AP foot, lateral foot and Harris heel radiographs were obtained of the left foot intraoperatively.  These show interval arthrodesis of the talonavicular and subtalar joints.  Hardware is appropriately positioned and of the appropriate lengths.  No other acute injuries are noted.     Wylene Simmer, MD     JH/MEDQ  D:  05/22/2016  T:  05/23/2016  Job:  [id number]

## 2016-06-13 NOTE — Addendum Note (Signed)
Addendum  created 06/13/16 1018 by Rica Koyanagi, MD   Sign clinical note

## 2016-08-25 ENCOUNTER — Other Ambulatory Visit: Payer: Self-pay | Admitting: Cardiology

## 2016-08-25 DIAGNOSIS — I712 Thoracic aortic aneurysm, without rupture, unspecified: Secondary | ICD-10-CM

## 2016-08-28 ENCOUNTER — Other Ambulatory Visit: Payer: Medicare Other

## 2016-09-02 ENCOUNTER — Ambulatory Visit
Admission: RE | Admit: 2016-09-02 | Discharge: 2016-09-02 | Disposition: A | Discharge status: Home / Self Care | Payer: Medicare Other | Source: Ambulatory Visit | Attending: Cardiology | Admitting: Cardiology

## 2016-09-02 DIAGNOSIS — I712 Thoracic aortic aneurysm, without rupture, unspecified: Secondary | ICD-10-CM

## 2017-01-01 ENCOUNTER — Other Ambulatory Visit: Payer: Self-pay | Admitting: Cardiology

## 2017-01-01 ENCOUNTER — Ambulatory Visit
Admission: RE | Admit: 2017-01-01 | Discharge: 2017-01-01 | Disposition: A | Discharge status: Home / Self Care | Payer: Medicare Other | Source: Ambulatory Visit | Attending: Cardiology | Admitting: Cardiology

## 2017-01-01 DIAGNOSIS — R0602 Shortness of breath: Secondary | ICD-10-CM

## 2017-03-11 ENCOUNTER — Other Ambulatory Visit: Payer: Self-pay | Admitting: Cardiology

## 2017-03-11 ENCOUNTER — Telehealth: Payer: Self-pay

## 2017-03-11 ENCOUNTER — Encounter: Payer: Self-pay | Admitting: Gastroenterology

## 2017-03-11 ENCOUNTER — Ambulatory Visit: Payer: Medicare Other | Admitting: Gastroenterology

## 2017-03-11 VITALS — BP 124/80 | HR 72 | Ht 69.25 in | Wt 318.2 lb

## 2017-03-11 DIAGNOSIS — K921 Melena: Secondary | ICD-10-CM

## 2017-03-11 DIAGNOSIS — I712 Thoracic aortic aneurysm, without rupture, unspecified: Secondary | ICD-10-CM

## 2017-03-11 DIAGNOSIS — Z7902 Long term (current) use of antithrombotics/antiplatelets: Secondary | ICD-10-CM | POA: Diagnosis not present

## 2017-03-11 DIAGNOSIS — K219 Gastro-esophageal reflux disease without esophagitis: Secondary | ICD-10-CM | POA: Diagnosis not present

## 2017-03-11 MED ORDER — NA SULFATE-K SULFATE-MG SULF 17.5-3.13-1.6 GM/177ML PO SOLN: 1.0000 | Freq: Once | ORAL | 0 refills | Status: AC

## 2017-03-11 MED ORDER — PANTOPRAZOLE SODIUM 40 MG PO TBEC: 40.0000 mg | DELAYED_RELEASE_TABLET | Freq: Every day | ORAL | 11 refills | Status: DC

## 2017-03-11 NOTE — Telephone Encounter (Signed)
Please forward to Dr. Wynonia Lawman who is patient's primary cardiologist.

## 2017-03-11 NOTE — Progress Notes (Signed)
History of Present Illness: This is a 67 year old male referred by Redmond School, MD for the evaluation of hematochezia and GERD. History of a gastric ulcer-healing documented on EGD in 2015.  He relates undergoing colonoscopy about 20-22 years ago at [facility] and he recalls at that exam was normal.  Patient relates intermittent difficulties with small amounts of bright red blood per rectum occasionally he will have larger amounts of blood per rectum.  The symptoms occur with bowel movements.  He has been recommended to undergo colonoscopy in the past however he has declined due to fear of perforation.  He relates that his mother suffered a colon perforation at colonoscopy and passed away due to complications from the perforation and subsequent surgeries.  He relates epigastric pain and burning substernal pain following every meal.  He states he takes several Tums after every meal. Denies weight loss, abdominal pain, constipation, diarrhea, change in stool caliber, melena, nausea, vomiting, dysphagia, chest pain.     Allergies  Allergen Reactions  . Adhesive [Tape] Other (See Comments)    Blisters.  Paper tape ok.   Outpatient Medications Prior to Visit  Medication Sig Dispense Refill  . aspirin 325 MG tablet Take 325 mg by mouth daily.     . clopidogrel (PLAVIX) 75 MG tablet Take 75 mg by mouth daily.    Marland Kitchen docusate sodium (COLACE) 100 MG capsule Take 1 capsule (100 mg total) by mouth 2 (two) times daily. While taking narcotic pain medicine. 30 capsule 0  . isosorbide mononitrate (IMDUR) 30 MG 24 hr tablet Take 30 mg by mouth daily.    . metoprolol succinate (TOPROL-XL) 25 MG 24 hr tablet Take 25 mg by mouth daily.   3  . rosuvastatin (CRESTOR) 40 MG tablet Take 20 mg by mouth daily.     Marland Kitchen losartan-hydrochlorothiazide (HYZAAR) 100-12.5 MG tablet Take 1 tablet by mouth daily.  1  . oxyCODONE (ROXICODONE) 5 MG immediate release tablet Take 1-2 tablets (5-10 mg total) by mouth every 4 (four)  hours as needed for moderate pain or severe pain. For no more than 3 days. 30 tablet 0  . senna (SENOKOT) 8.6 MG TABS tablet Take 2 tablets (17.2 mg total) by mouth 2 (two) times daily. 30 each 0  . tiZANidine (ZANAFLEX) 4 MG tablet Take 1 tablet (4 mg total) by mouth every 6 (six) hours as needed for muscle spasms. 60 tablet 0   No facility-administered medications prior to visit.    Past Medical History:  Diagnosis Date  . CAD (coronary artery disease) 08/01/2013   Cath 2005  normal Left main, scattered irregularities LAD, 80% stenosis ostial Diag 1, scattered irregularities CFX, scattered irregularities RCA  Taxus stent March 2005 Diagonal   . Disc displacement, lumbar   . Foot ulceration ([place])    left  . GERD (gastroesophageal reflux disease)   . History of ulcer disease 10/19/2013  . Hyperlipidemia   . Hypertension   . Lumbar spondylosis 04/01/2013  . Morbid obesity ([place])   . Wears glasses    Past Surgical History:  Procedure Laterality Date  . APPENDECTOMY    . BACK SURGERY    . CARDIAC CATHETERIZATION     05    1 stent   dr Wynonia Lawman  . ESOPHAGOGASTRODUODENOSCOPY N/A 08/02/2013   Procedure: ESOPHAGOGASTRODUODENOSCOPY (EGD);  Surgeon: Ladene Artist, MD;  Location: [facility] ENDOSCOPY;  Service: Endoscopy;  Laterality: N/A;  . FOOT ARTHRODESIS Left 05/22/2016   Procedure: Left Subtalar  and Talonavicular Joint Arthrodesis;  Surgeon: Wylene Simmer, MD;  Location: [place];  Service: Orthopedics;  Laterality: Left;  . FOOT SURGERY    . GASTROCNEMIUS RECESSION Left 05/22/2016   Procedure: Left Gastroc Recession;  Surgeon: Wylene Simmer, MD;  Location: [place];  Service: Orthopedics;  Laterality: Left;  Marland Kitchen GRAFT APPLICATION N/A 09/19/6732   Procedure: INTEGRA GRAFT APPLICATION;  Surgeon: Trula Slade, DPM;  Location: [place];  Service: Podiatry;  Laterality: N/A;  . LEFT HEART CATHETERIZATION WITH CORONARY ANGIOGRAM N/A 02/09/2014    Procedure: LEFT HEART CATHETERIZATION WITH CORONARY ANGIOGRAM;  Surgeon: Peter M Martinique, MD;  Location: [facility] CATH LAB;  Service: Cardiovascular;  Laterality: N/A;  . LUMBAR LAMINECTOMY     x2  . LUMBAR LAMINECTOMY/DECOMPRESSION MICRODISCECTOMY Left 09/03/2015   Procedure: MICRODISCECTOMY L5-S1 LEFT;  Surgeon: Ashok Pall, MD;  Location: [place] NEURO ORS;  Service: Neurosurgery;  Laterality: Left;   MICRODISCECTOMY L5-S1 LEFT  . LUMBAR LAMINECTOMY/DECOMPRESSION MICRODISCECTOMY Left 02/13/2016   Procedure: MICRODISCECTOMY LUMBAR FIVE- SACRAL ONE LEFT;  Surgeon: Ashok Pall, MD;  Location: [place];  Service: Neurosurgery;  Laterality: Left;  . TONSILLECTOMY    . WOUND DEBRIDEMENT N/A 06/22/2015   Procedure: DEBRIDEMENT WOUND;  Surgeon: Trula Slade, DPM;  Location: [place];  Service: Podiatry;  Laterality: N/A;   Social History   Socioeconomic History  . Marital status: Married    Spouse name: None  . Number of children: None  . Years of education: None  . Highest education level: None  Social Needs  . Financial resource strain: None  . Food insecurity - worry: None  . Food insecurity - inability: None  . Transportation needs - medical: None  . Transportation needs - non-medical: None  Occupational History  . None  Tobacco Use  . Smoking status: Never Smoker  . Smokeless tobacco: Former Systems developer    Types: Chew  Substance and Sexual Activity  . Alcohol use: No    Alcohol/week: 0.6 oz    Types: 1 Glasses of wine per week  . Drug use: No  . Sexual activity: None  Other Topics Concern  . None  Social History Narrative   Pt lives in [place] with his wife. They have one son who does not live in the area. Pt previously worked for [employer] but is retired. He used to [employer]. He has never smoke and drinks 4-5 alcoholic beverages per week. No illicit drug use.   Family History  Problem Relation Age of Onset  . Coronary artery disease Father        open  heart surgery   . Cancer Brother   . CAD Cousin        requiring stent  . Colon cancer Neg Hx   . Pancreatic cancer Neg Hx   . Rectal cancer Neg Hx   . Stomach cancer Neg Hx       Review of Systems: Pertinent positive and negative review of systems were noted in the above HPI section. All other review of systems were otherwise negative.    Physical Exam: General: Well developed, well nourished, no acute distress Head: Normocephalic and atraumatic Eyes:  sclerae anicteric, EOMI Ears: Normal auditory acuity Mouth: No deformity or lesions Neck: Supple, no masses or thyromegaly Lungs: Clear throughout to auscultation Heart: Regular rate and rhythm; no murmurs, rubs or bruits Abdomen: Soft, non tender and non distended. No masses, hepatosplenomegaly or hernias noted. Normal Bowel sounds  Rectal: deferred to colonoscopy Musculoskeletal: Symmetrical with no gross deformities  Skin: No lesions on visible extremities Pulses:  Normal pulses noted Extremities: No clubbing, cyanosis, edema or deformities noted Neurological: Alert oriented x 4, grossly nonfocal Cervical Nodes:  No significant cervical adenopathy Inguinal Nodes: No significant inguinal adenopathy Psychological:  Alert and cooperative. Normal mood and affect  Assessment and Recommendations:  1.  Hematochezia.  Rule out colorectal neoplasms, hemorrhoids and other disorders.  Schedule colonoscopy.  The risks (including bleeding, perforation, infection, missed lesions, medication reactions and possible hospitalization or surgery if complications occur), benefits, and alternatives to colonoscopy with possible biopsy and possible polypectomy were discussed with the patient and they consent to proceed.  We extensively discussed the risks of colonoscopy and I addressed his questions to his satisfaction.  2. Hold Plavix 5 days before procedure - will instruct when and how to resume after procedure. Low but real risk of cardiovascular  event such as heart attack, stroke, embolism, thrombosis or ischemia/infarct of other organs off Plavix 5 explained and need to seek urgent help if this occurs. The patient consents to proceed. Will communicate by phone or EMR with patient's prescribing provider to confirm that holding Plavix 5 is reasonable in this case. Continue ASA.  3. GERD. Pantoprazole 40 mg daily, long term. Antireflux measures long term.     cc: Redmond School, [address] [place], [place] [id number]

## 2017-03-11 NOTE — Telephone Encounter (Signed)
Forwarded to notify requesting party, via EPIC

## 2017-03-11 NOTE — Telephone Encounter (Signed)
   Stephen Medina [facility] [birth year] [id number]  We have scheduled the above named patient for a(n) Colonoscopy procedure. Our records show that (s)he is on anticoagulation therapy.  Please advise as to whether the patient may come off their therapy of Plavix 5 days prior to their procedure which is scheduled for 04/10/17.  Please route your response to Marlon Pel, CMA or fax response to [phone].  Sincerely,    [facility]

## 2017-03-11 NOTE — Patient Instructions (Signed)
We have sent the following medications to your pharmacy for you to pick up at your convenience: pantoprazole.   Patient advised to avoid spicy, acidic, citrus, chocolate, mints, fruit and fruit juices.  Limit the intake of caffeine, alcohol and Soda.  Don't exercise too soon after eating.  Don't lie down within 3-4 hours of eating.  Elevate the head of your bed.   You have been scheduled for a colonoscopy. Please follow written instructions given to you at your visit today.  Please pick up your prep supplies at the pharmacy within the next 1-3 days. If you use inhalers (even only as needed), please bring them with you on the day of your procedure. Your physician has requested that you go to www.startemmi.com and enter the access code given to you at your visit today. This web site gives a general overview about your procedure. However, you should still follow specific instructions given to you by our office regarding your preparation for the procedure.  Thank you for choosing me and [facility].  Pricilla Riffle. Dagoberto Ligas., MD., Marval Regal

## 2017-03-12 NOTE — Telephone Encounter (Signed)
Left a message for patient to return my call. 

## 2017-03-13 NOTE — Telephone Encounter (Signed)
Patient notified to hold Plavix 5 days prior to his procedure. Patient verbalized understanding. 

## 2017-03-13 NOTE — Telephone Encounter (Signed)
Left a message for patient to return my call. 

## 2017-03-17 ENCOUNTER — Ambulatory Visit
Admission: RE | Admit: 2017-03-17 | Discharge: 2017-03-17 | Disposition: A | Discharge status: Home / Self Care | Payer: Medicare Other | Source: Ambulatory Visit | Attending: Cardiology | Admitting: Cardiology

## 2017-03-17 DIAGNOSIS — I712 Thoracic aortic aneurysm, without rupture, unspecified: Secondary | ICD-10-CM

## 2017-03-27 ENCOUNTER — Encounter: Payer: Self-pay | Admitting: Gastroenterology

## 2017-04-10 ENCOUNTER — Encounter: Payer: Medicare Other | Admitting: Gastroenterology

## 2017-04-27 ENCOUNTER — Other Ambulatory Visit: Payer: Self-pay

## 2017-04-27 ENCOUNTER — Ambulatory Visit (AMBULATORY_SURGERY_CENTER): Payer: Medicare Other | Admitting: Gastroenterology

## 2017-04-27 ENCOUNTER — Encounter: Payer: Self-pay | Admitting: Gastroenterology

## 2017-04-27 VITALS — BP 152/86 | HR 56 | Temp 98.9°F | Resp 17 | Ht 69.25 in | Wt 318.0 lb

## 2017-04-27 DIAGNOSIS — K921 Melena: Secondary | ICD-10-CM | POA: Diagnosis present

## 2017-04-27 DIAGNOSIS — K640 First degree hemorrhoids: Secondary | ICD-10-CM

## 2017-04-27 MED ORDER — SODIUM CHLORIDE 0.9 % IV SOLN
500.0000 mL | Freq: Once | INTRAVENOUS | Status: DC
Start: 2017-04-27 — End: 2018-01-20

## 2017-04-27 NOTE — Op Note (Signed)
[facility] Patient Name: Stephen Medina Procedure Date: 04/27/2017 3:40 PM MRN: [id number] Endoscopist: Ladene Artist , MD Age: 67 Referring MD:  Date of Birth: [birth year] Gender: Male Account #: [id number] Procedure:                Colonoscopy Indications:              Hematochezia Medicines:                Monitored Anesthesia Care Procedure:                Pre-Anesthesia Assessment:                           - Prior to the procedure, a History and Physical                            was performed, and patient medications and                            allergies were reviewed. The patient's tolerance of                            previous anesthesia was also reviewed. The risks                            and benefits of the procedure and the sedation                            options and risks were discussed with the patient.                            All questions were answered, and informed consent                            was obtained. Prior Anticoagulants: The patient has                            taken Plavix (clopidogrel), last dose was 5 days                            prior to procedure. ASA Grade Assessment: III - A                            patient with severe systemic disease. After                            reviewing the risks and benefits, the patient was                            deemed in satisfactory condition to undergo the                            procedure.  After obtaining informed consent, the colonoscope                            was passed under direct vision. Throughout the                            procedure, the patient's blood pressure, pulse, and                            oxygen saturations were monitored continuously. The                            Colonoscope was introduced through the anus and                            advanced to the the cecum, identified by                            appendiceal  orifice and ileocecal valve. The                            ileocecal valve, appendiceal orifice, and rectum                            were photographed. The quality of the bowel                            preparation was excellent. The colonoscopy was                            performed without difficulty. The patient tolerated                            the procedure well. Scope In: 3:46:46 PM Scope Out: 3:59:37 PM Scope Withdrawal Time: 0 hours 9 minutes 58 seconds  Total Procedure Duration: 0 hours 12 minutes 51 seconds  Findings:                 The perianal and digital rectal examinations were                            normal.                           Internal hemorrhoids were found during                            retroflexion. The hemorrhoids were medium-sized and                            Grade I (internal hemorrhoids that do not prolapse).                           The exam was otherwise without abnormality on  direct and retroflexion views. Complications:            No immediate complications. Estimated blood loss:                            None. Estimated Blood Loss:     Estimated blood loss: none. Impression:               - Internal hemorrhoids.                           - The examination was otherwise normal on direct                            and retroflexion views.                           - No specimens collected. Recommendation:           - Repeat colonoscopy in 10 years for screening                            purposes.                           - Resume Plavix (clopidogrel) today at prior dose.                            Refer to managing physician for further adjustment                            of therapy.                           - Patient has a contact number available for                            emergencies. The signs and symptoms of potential                            delayed complications were discussed with the                             patient. Return to normal activities tomorrow.                            Written discharge instructions were provided to the                            patient.                           - Resume previous diet.                           - Continue present medications.                           -  Return for hemorrhoidal banding if rectal                            bleeding persists. Ladene Artist, MD 04/27/2017 4:02:38 PM This report has been signed electronically.

## 2017-04-27 NOTE — Progress Notes (Signed)
To PACU, VSS. Report to [email or web address] 

## 2017-04-27 NOTE — Patient Instructions (Signed)
*  Handouts given to care partner on banding and hemorrhoids.  YOU HAD AN ENDOSCOPIC PROCEDURE TODAY AT [facility]:   Refer to the procedure report that was given to you for any specific questions about what was found during the examination.  If the procedure report does not answer your questions, please call your gastroenterologist to clarify.  If you requested that your care partner not be given the details of your procedure findings, then the procedure report has been included in a sealed envelope for you to review at your convenience later.  YOU SHOULD EXPECT: Some feelings of bloating in the abdomen. Passage of more gas than usual.  Walking can help get rid of the air that was put into your GI tract during the procedure and reduce the bloating. If you had a lower endoscopy (such as a colonoscopy or flexible sigmoidoscopy) you may notice spotting of blood in your stool or on the toilet paper. If you underwent a bowel prep for your procedure, you may not have a normal bowel movement for a few days.  Please Note:  You might notice some irritation and congestion in your nose or some drainage.  This is from the oxygen used during your procedure.  There is no need for concern and it should clear up in a day or so.  SYMPTOMS TO REPORT IMMEDIATELY:   Following lower endoscopy (colonoscopy or flexible sigmoidoscopy):  Excessive amounts of blood in the stool  Significant tenderness or worsening of abdominal pains  Swelling of the abdomen that is new, acute  Fever of 100F or higher   For urgent or emergent issues, a gastroenterologist can be reached at any hour by calling [phone].   DIET:  We do recommend a small meal at first, but then you may proceed to your regular diet.  Drink plenty of fluids but you should avoid alcoholic beverages for 24 hours.  ACTIVITY:  You should plan to take it easy for the rest of today and you should NOT DRIVE or use heavy machinery until  tomorrow (because of the sedation medicines used during the test).    FOLLOW UP: Our staff will call the number listed on your records the next business day following your procedure to check on you and address any questions or concerns that you may have regarding the information given to you following your procedure. If we do not reach you, we will leave a message.  However, if you are feeling well and you are not experiencing any problems, there is no need to return our call.  We will assume that you have returned to your regular daily activities without incident.  If any biopsies were taken you will be contacted by phone or by letter within the next 1-3 weeks.  Please call us at [phone] if you have not heard about the biopsies in 3 weeks.    SIGNATURES/CONFIDENTIALITY: You and/or your care partner have signed paperwork which will be entered into your electronic medical record.  These signatures attest to the fact that that the information above on your After Visit Summary has been reviewed and is understood.  Full responsibility of the confidentiality of this discharge information lies with you and/or your care-partner.

## 2017-04-28 ENCOUNTER — Telehealth: Payer: Self-pay

## 2017-04-28 NOTE — Telephone Encounter (Signed)
  Follow up Call-  Call back number 04/27/2017  Post procedure Call Back phone  # [phone]  Permission to leave phone message Yes  Some recent data might be hidden     Patient questions:  Do you have a fever, pain , or abdominal swelling? No. Pain Score  0 *  Have you tolerated food without any problems? Yes.    Have you been able to return to your normal activities? Yes.    Do you have any questions about your discharge instructions: Diet   No. Medications  No. Follow up visit  No.  Do you have questions or concerns about your Care? No.  Actions: * If pain score is 4 or above: No action needed, pain <4.

## 2017-05-28 DIAGNOSIS — M25561 Pain in right knee: Secondary | ICD-10-CM

## 2017-05-28 HISTORY — DX: Pain in right knee: M25.561

## 2017-08-10 ENCOUNTER — Telehealth: Payer: Self-pay | Admitting: *Deleted

## 2017-08-10 NOTE — Telephone Encounter (Signed)
Pt states his insurance will not pay for the colchicine, but will pay for the [place].

## 2017-08-10 NOTE — Telephone Encounter (Signed)
I spoke with pt and he states his pharmacy has already gotten the medication changed to [place].

## 2017-11-27 ENCOUNTER — Encounter: Payer: Self-pay | Admitting: Cardiology

## 2017-11-27 ENCOUNTER — Ambulatory Visit (INDEPENDENT_AMBULATORY_CARE_PROVIDER_SITE_OTHER): Payer: Medicare Other | Admitting: Cardiology

## 2017-11-27 VITALS — BP 142/76 | HR 69 | Ht 69.25 in | Wt 317.0 lb

## 2017-11-27 DIAGNOSIS — R079 Chest pain, unspecified: Secondary | ICD-10-CM

## 2017-11-27 DIAGNOSIS — I10 Essential (primary) hypertension: Secondary | ICD-10-CM

## 2017-11-27 DIAGNOSIS — I25118 Atherosclerotic heart disease of native coronary artery with other forms of angina pectoris: Secondary | ICD-10-CM | POA: Diagnosis not present

## 2017-11-27 DIAGNOSIS — E782 Mixed hyperlipidemia: Secondary | ICD-10-CM

## 2017-11-27 DIAGNOSIS — I712 Thoracic aortic aneurysm, without rupture, unspecified: Secondary | ICD-10-CM

## 2017-11-27 HISTORY — DX: Mixed hyperlipidemia: E78.2

## 2017-11-27 MED ORDER — NITROGLYCERIN 0.4 MG SL SUBL: 0.4000 mg | SUBLINGUAL_TABLET | SUBLINGUAL | 11 refills | Status: DC | PRN

## 2017-11-27 NOTE — Patient Instructions (Signed)
Medication Instructions:  Your physician has recommended you make the following change in your medication:   START Nitroglycerin 0.4 mg sublingual (under your tongue) as needed for chest pain. If experiencing chest pain, stop what you are doing and sit down. Take 1 nitroglycerin and wait 5 minutes. If chest pain continues, take another nitroglycerin and wait 5 minutes. If chest pain does not subside, take 1 more nitroglycerin and dial 911. You make take a total of 3 nitroglycerin in a 15 minute time frame.  If you need a refill on your cardiac medications before your next appointment, please call your pharmacy.   Lab work: Your physician recommends that you have the following labs drawn: BMP to be done today.  If you have labs (blood work) drawn today and your tests are completely normal, you will receive your results only by: Marland Kitchen MyChart Message (if you have MyChart) OR . A paper copy in the mail If you have any lab test that is abnormal or we need to change your treatment, we will call you to review the results.  Testing/Procedures: Your physician has requested that you have a lexiscan myoview. For further information please visit [email or web address]. Please follow instruction sheet, as given.  Non-Cardiac CT scanning, (CAT scanning), is a noninvasive, special x-ray that produces cross-sectional images of the body using x-rays and a computer. CT scans help physicians diagnose and treat medical conditions. For some CT exams, a contrast material is used to enhance visibility in the area of the body being studied. CT scans provide greater clarity and reveal more details than regular x-ray exams.  Follow-Up: At [facility], you and your health needs are our priority.  As part of our continuing mission to provide you with exceptional heart care, we have created designated Provider Care Teams.  These Care Teams include your primary Cardiologist (physician) and Advanced Practice Providers (APPs -   Physician Assistants and Nurse Practitioners) who all work together to provide you with the care you need, when you need it.  You will need a follow up appointment in 6 months.  Please call our office 2 months in advance to schedule this appointment.  You may see another member of our [employer] Provider Team in [place]: Jenne Campus, MD . Shirlee More, MD  Any Other Special Instructions Will Be Listed Below (If Applicable).  Nitroglycerin sublingual tablets What is this medicine? NITROGLYCERIN (nye troe GLI ser in) is a type of vasodilator. It relaxes blood vessels, increasing the blood and oxygen supply to your heart. This medicine is used to relieve chest pain caused by angina. It is also used to prevent chest pain before activities like climbing stairs, going outdoors in cold weather, or sexual activity. This medicine may be used for other purposes; ask your health care provider or pharmacist if you have questions. COMMON BRAND NAME(S): Nitroquick, Nitrostat, Nitrotab What should I tell my health care provider before I take this medicine? They need to know if you have any of these conditions: -anemia -head injury, recent stroke, or bleeding in the brain -liver disease -previous heart attack -an unusual or allergic reaction to nitroglycerin, other medicines, foods, dyes, or preservatives -pregnant or trying to get pregnant -breast-feeding How should I use this medicine? Take this medicine by mouth as needed. At the first sign of an angina attack (chest pain or tightness) place one tablet under your tongue. You can also take this medicine 5 to 10 minutes before an event likely to produce chest pain.  Follow the directions on the prescription label. Let the tablet dissolve under the tongue. Do not swallow whole. Replace the dose if you accidentally swallow it. It will help if your mouth is not dry. Saliva around the tablet will help it to dissolve more quickly. Do not eat or drink,  smoke or chew tobacco while a tablet is dissolving. If you are not better within 5 minutes after taking ONE dose of nitroglycerin, call 9-1-1 immediately to seek emergency medical care. Do not take more than 3 nitroglycerin tablets over 15 minutes. If you take this medicine often to relieve symptoms of angina, your doctor or health care professional may provide you with different instructions to manage your symptoms. If symptoms do not go away after following these instructions, it is important to call 9-1-1 immediately. Do not take more than 3 nitroglycerin tablets over 15 minutes. Talk to your pediatrician regarding the use of this medicine in children. Special care may be needed. Overdosage: If you think you have taken too much of this medicine contact a poison control center or emergency room at once. NOTE: This medicine is only for you. Do not share this medicine with others. What if I miss a dose? This does not apply. This medicine is only used as needed. What may interact with this medicine? Do not take this medicine with any of the following medications: -certain migraine medicines like ergotamine and dihydroergotamine (DHE) -medicines used to treat erectile dysfunction like sildenafil, tadalafil, and vardenafil -riociguat This medicine may also interact with the following medications: -alteplase -aspirin -heparin -medicines for high blood pressure -medicines for mental depression -other medicines used to treat angina -phenothiazines like chlorpromazine, mesoridazine, prochlorperazine, thioridazine This list may not describe all possible interactions. Give your health care provider a list of all the medicines, herbs, non-prescription drugs, or dietary supplements you use. Also tell them if you smoke, drink alcohol, or use illegal drugs. Some items may interact with your medicine. What should I watch for while using this medicine? Tell your doctor or health care professional if you feel  your medicine is no longer working. Keep this medicine with you at all times. Sit or lie down when you take your medicine to prevent falling if you feel dizzy or faint after using it. Try to remain calm. This will help you to feel better faster. If you feel dizzy, take several deep breaths and lie down with your feet propped up, or bend forward with your head resting between your knees. You may get drowsy or dizzy. Do not drive, use machinery, or do anything that needs mental alertness until you know how this drug affects you. Do not stand or sit up quickly, especially if you are an older patient. This reduces the risk of dizzy or fainting spells. Alcohol can make you more drowsy and dizzy. Avoid alcoholic drinks. Do not treat yourself for coughs, colds, or pain while you are taking this medicine without asking your doctor or health care professional for advice. Some ingredients may increase your blood pressure. What side effects may I notice from receiving this medicine? Side effects that you should report to your doctor or health care professional as soon as possible: -blurred vision -dry mouth -skin rash -sweating -the feeling of extreme pressure in the head -unusually weak or tired Side effects that usually do not require medical attention (report to your doctor or health care professional if they continue or are bothersome): -flushing of the face or neck -headache -irregular heartbeat, palpitations -nausea,  vomiting This list may not describe all possible side effects. Call your doctor for medical advice about side effects. You may report side effects to FDA at 1-800-FDA-1088. Where should I keep my medicine? Keep out of the reach of children. Store at room temperature between 20 and 25 degrees C (68 and 77 degrees F). Store in [employer]. Protect from light and moisture. Keep tightly closed. Throw away any unused medicine after the expiration date. NOTE: This sheet is a summary. It  may not cover all possible information. If you have questions about this medicine, talk to your doctor, pharmacist, or health care provider.  2018 Elsevier/Gold Standard (2012-10-28 17:57:36)

## 2017-11-27 NOTE — Progress Notes (Signed)
Cardiology Office Note:    Date:  11/27/2017   ID:  Stephen Medina, DOB December 26, 1950, MRN [id number]  PCP:  Sharilyn Sites, MD  Cardiologist:  Jenean Lindau, MD   Referring MD: Sharilyn Sites, MD    ASSESSMENT:    1. Chest pain, unspecified type   2. Thoracic aortic aneurysm without rupture ([place])   3. Coronary artery disease involving native coronary artery of native heart with other form of angina pectoris ([place])   4. Essential hypertension   5. Morbid obesity ([place])   6. Mixed dyslipidemia    PLAN:    In order of problems listed above:  1. Secondary prevention stressed with the patient.  Importance of compliance with diet and medication stressed the vocalized understanding. 2. His blood pressure is stable.  Diet was discussed for obesity and wrist stressed plans to diet better and lose weight. 3. Will be back in the next few days for blood work including lipids 4. In view of his chest pain symptoms we will do a Lexiscan sestamibi.  He will also undergo CT of the chest with contrast to monitor his aneurysm. 5. Patient will be seen in follow-up appointment in 6 months or earlier if the patient has any concerns. 6. Sublingual nitroglycerin prescription was sent, its protocol and 911 protocol explained and the patient vocalized understanding questions were answered to the patient's satisfaction   Medication Adjustments/Labs and Tests Ordered: Current medicines are reviewed at length with the patient today.  Concerns regarding medicines are outlined above.  Orders Placed This Encounter  Procedures  . CT ANGIO CHEST AORTA W &/OR WO CONTRAST  . Basic metabolic panel  . CBC with Differential/Platelet  . TSH  . Hepatic function panel  . Lipid panel  . MYOCARDIAL PERFUSION IMAGING  . EKG 12-Lead   Meds ordered this encounter  Medications  . nitroGLYCERIN (NITROSTAT) 0.4 MG SL tablet    Sig: Place 1 tablet (0.4 mg total) under the tongue every 5 (five) minutes as needed.   Dispense:  25 tablet    Refill:  11     No chief complaint on file.    History of Present Illness:    Stephen Medina is a 67 y.o. male.  Patient is a pleasant gentleman with past medical history of coronary artery disease post coronary stenting in 2005, essential hypertension, dyslipidemia and morbid obesity.  He also has an ascending aortic aneurysm which is monitored, semiannually basis.  Patient mentions to me that occasionally has chest pain not related to exertion.Marland Kitchen  He leads a sedentary lifestyle because of his morbid obesity and he walks with a cane.  His chest pain does not radiate to the neck or to the arms.  At times it is sharp and he has never used nitroglycerin.  At the time of my evaluation, the patient is alert awake oriented and in no distress.  Past Medical History:  Diagnosis Date  . CAD (coronary artery disease) 08/01/2013   Cath 2005  normal Left main, scattered irregularities LAD, 80% stenosis ostial Diag 1, scattered irregularities CFX, scattered irregularities RCA  Taxus stent March 2005 Diagonal   . Disc displacement, lumbar   . Foot ulceration ([place])    left  . GERD (gastroesophageal reflux disease)   . History of ulcer disease 10/19/2013  . Hyperlipidemia   . Hypertension   . Lumbar spondylosis 04/01/2013  . Morbid obesity ([place])   . Wears glasses     Past Surgical History:  Procedure  Laterality Date  . APPENDECTOMY    . BACK SURGERY    . CARDIAC CATHETERIZATION     05    1 stent   dr Wynonia Lawman  . ESOPHAGOGASTRODUODENOSCOPY N/A 08/02/2013   Procedure: ESOPHAGOGASTRODUODENOSCOPY (EGD);  Surgeon: Ladene Artist, MD;  Location: [facility] ENDOSCOPY;  Service: Endoscopy;  Laterality: N/A;  . FOOT ARTHRODESIS Left 05/22/2016   Procedure: Left Subtalar and Talonavicular Joint Arthrodesis;  Surgeon: Wylene Simmer, MD;  Location: [place];  Service: Orthopedics;  Laterality: Left;  . FOOT SURGERY    . GASTROCNEMIUS RECESSION Left 05/22/2016   Procedure: Left  Gastroc Recession;  Surgeon: Wylene Simmer, MD;  Location: [place];  Service: Orthopedics;  Laterality: Left;  Marland Kitchen GRAFT APPLICATION N/A 04/14/7406   Procedure: INTEGRA GRAFT APPLICATION;  Surgeon: Trula Slade, DPM;  Location: [place];  Service: Podiatry;  Laterality: N/A;  . LEFT HEART CATHETERIZATION WITH CORONARY ANGIOGRAM N/A 02/09/2014   Procedure: LEFT HEART CATHETERIZATION WITH CORONARY ANGIOGRAM;  Surgeon: Peter M Martinique, MD;  Location: [facility] CATH LAB;  Service: Cardiovascular;  Laterality: N/A;  . LUMBAR LAMINECTOMY     x2  . LUMBAR LAMINECTOMY/DECOMPRESSION MICRODISCECTOMY Left 09/03/2015   Procedure: MICRODISCECTOMY L5-S1 LEFT;  Surgeon: Ashok Pall, MD;  Location: [place] NEURO ORS;  Service: Neurosurgery;  Laterality: Left;   MICRODISCECTOMY L5-S1 LEFT  . LUMBAR LAMINECTOMY/DECOMPRESSION MICRODISCECTOMY Left 02/13/2016   Procedure: MICRODISCECTOMY LUMBAR FIVE- SACRAL ONE LEFT;  Surgeon: Ashok Pall, MD;  Location: [place];  Service: Neurosurgery;  Laterality: Left;  . TONSILLECTOMY    . WOUND DEBRIDEMENT N/A 06/22/2015   Procedure: DEBRIDEMENT WOUND;  Surgeon: Trula Slade, DPM;  Location: [place];  Service: Podiatry;  Laterality: N/A;    Current Medications: Current Meds  Medication Sig  . aspirin 325 MG tablet Take 325 mg by mouth daily.   . clopidogrel (PLAVIX) 75 MG tablet Take 75 mg by mouth daily.  Marland Kitchen docusate sodium (COLACE) 100 MG capsule Take 1 capsule (100 mg total) by mouth 2 (two) times daily. While taking narcotic pain medicine.  . isosorbide mononitrate (IMDUR) 30 MG 24 hr tablet Take 30 mg by mouth daily.  . metoprolol succinate (TOPROL-XL) 25 MG 24 hr tablet Take 25 mg by mouth daily.   . pantoprazole (PROTONIX) 40 MG tablet Take 1 tablet (40 mg total) by mouth daily.  . rosuvastatin (CRESTOR) 40 MG tablet Take 20 mg by mouth daily.    Current Facility-Administered Medications for the 11/27/17 encounter (Office  Visit) with Revankar, Reita Cliche, MD  Medication  . 0.9 %  sodium chloride infusion     Allergies:   Adhesive [tape]   Social History   Socioeconomic History  . Marital status: Married    Spouse name: Not on file  . Number of children: Not on file  . Years of education: Not on file  . Highest education level: Not on file  Occupational History  . Not on file  Social Needs  . Financial resource strain: Not on file  . Food insecurity:    Worry: Not on file    Inability: Not on file  . Transportation needs:    Medical: Not on file    Non-medical: Not on file  Tobacco Use  . Smoking status: Never Smoker  . Smokeless tobacco: Former Systems developer    Types: Chew  Substance and Sexual Activity  . Alcohol use: No    Alcohol/week: 1.0 standard drinks    Types:  1 Glasses of wine per week  . Drug use: No  . Sexual activity: Not on file  Lifestyle  . Physical activity:    Days per week: Not on file    Minutes per session: Not on file  . Stress: Not on file  Relationships  . Social connections:    Talks on phone: Not on file    Gets together: Not on file    Attends religious service: Not on file    Active member of club or organization: Not on file    Attends meetings of clubs or organizations: Not on file    Relationship status: Not on file  Other Topics Concern  . Not on file  Social History Narrative   Pt lives in [place] with his wife. They have one son who does not live in the area. Pt previously worked for [employer] but is retired. He used to [employer]. He has never smoke and drinks 4-5 alcoholic beverages per week. No illicit drug use.     Family History: The patient's family history includes CAD in his cousin; Cancer in his brother; Coronary artery disease in his father. There is no history of Colon cancer, Pancreatic cancer, Rectal cancer, or Stomach cancer.  ROS:   Please see the history of present illness.    All other systems reviewed and are  negative.  EKGs/Labs/Other Studies Reviewed:    The following studies were reviewed today: EKG reveals sinus rhythm with nonspecific ST-T changes.   Recent Labs: No results found for requested labs within last 8760 hours.  Recent Lipid Panel No results found for: CHOL, TRIG, HDL, CHOLHDL, VLDL, LDLCALC, LDLDIRECT  Physical Exam:    VS:  BP (!) 142/76 (BP Location: Right Arm, Patient Position: Sitting, Cuff Size: Normal)   Pulse 69   Ht 5' 9.25" (1.759 m)   Wt (!) 317 lb (143.8 kg)   SpO2 98%   BMI 46.48 kg/m     Wt Readings from Last 3 Encounters:  11/27/17 (!) 317 lb (143.8 kg)  04/27/17 (!) 318 lb (144.2 kg)  03/11/17 (!) 318 lb 4 oz (144.4 kg)     GEN: Patient is in no acute distress HEENT: Normal NECK: No JVD; No carotid bruits LYMPHATICS: No lymphadenopathy CARDIAC: Hear sounds regular, 2/6 systolic murmur at the apex. RESPIRATORY:  Clear to auscultation without rales, wheezing or rhonchi  ABDOMEN: Soft, non-tender, non-distended MUSCULOSKELETAL:  No edema; No deformity  SKIN: Warm and dry NEUROLOGIC:  Alert and oriented x 3 PSYCHIATRIC:  Normal affect   Signed, Jenean Lindau, MD  11/27/2017 3:45 PM    [facility]

## 2017-12-01 ENCOUNTER — Other Ambulatory Visit: Payer: Self-pay | Admitting: Neurosurgery

## 2017-12-01 ENCOUNTER — Other Ambulatory Visit: Payer: Self-pay

## 2017-12-01 DIAGNOSIS — M48062 Spinal stenosis, lumbar region with neurogenic claudication: Secondary | ICD-10-CM

## 2017-12-01 DIAGNOSIS — E785 Hyperlipidemia, unspecified: Secondary | ICD-10-CM

## 2017-12-01 LAB — HEPATIC FUNCTION PANEL
ALT: 14 IU/L (ref 0–44)
AST: 17 IU/L (ref 0–40)
Albumin: 4.4 g/dL (ref 3.6–4.8)
Alkaline Phosphatase: 102 IU/L (ref 39–117)
Bilirubin Total: 0.7 mg/dL (ref 0.0–1.2)
Bilirubin, Direct: 0.18 mg/dL (ref 0.00–0.40)
Total Protein: 6.9 g/dL (ref 6.0–8.5)

## 2017-12-01 LAB — BASIC METABOLIC PANEL
BUN/Creatinine Ratio: 12 (ref 10–24)
BUN: 11 mg/dL (ref 8–27)
CO2: 20 mmol/L (ref 20–29)
Calcium: 9.4 mg/dL (ref 8.6–10.2)
Chloride: 99 mmol/L (ref 96–106)
Creatinine, Ser: 0.89 mg/dL (ref 0.76–1.27)
GFR calc Af Amer: 104 mL/min/{1.73_m2} (ref >59)
GFR calc non Af Amer: 90 mL/min/{1.73_m2} (ref >59)
Glucose: 97 mg/dL (ref 65–99)
Potassium: 4.2 mmol/L (ref 3.5–5.2)
Sodium: 136 mmol/L (ref 134–144)

## 2017-12-01 LAB — LIPID PANEL
Chol/HDL Ratio: 3.4 ratio (ref 0.0–5.0)
Cholesterol, Total: 179 mg/dL (ref 100–199)
HDL: 52 mg/dL (ref >39)
LDL Calculated: 103 mg/dL — ABNORMAL HIGH (ref 0–99)
Triglycerides: 121 mg/dL (ref 0–149)
VLDL Cholesterol Cal: 24 mg/dL (ref 5–40)

## 2017-12-01 LAB — CBC WITH DIFFERENTIAL/PLATELET
Basophils Absolute: 0 10*3/uL (ref 0.0–0.2)
Basos: 1 %
EOS (ABSOLUTE): 0.1 10*3/uL (ref 0.0–0.4)
Eos: 1 %
Hematocrit: 41.9 % (ref 37.5–51.0)
Hemoglobin: 14.4 g/dL (ref 13.0–17.7)
Immature Grans (Abs): 0 10*3/uL (ref 0.0–0.1)
Immature Granulocytes: 0 %
Lymphocytes Absolute: 2.3 10*3/uL (ref 0.7–3.1)
Lymphs: 31 %
MCH: 28.9 pg (ref 26.6–33.0)
MCHC: 34.4 g/dL (ref 31.5–35.7)
MCV: 84 fL (ref 79–97)
Monocytes Absolute: 0.6 10*3/uL (ref 0.1–0.9)
Monocytes: 8 %
Neutrophils Absolute: 4.5 10*3/uL (ref 1.4–7.0)
Neutrophils: 59 %
Platelets: 285 10*3/uL (ref 150–450)
RBC: 4.99 x10E6/uL (ref 4.14–5.80)
RDW: 14.1 % (ref 12.3–15.4)
WBC: 7.5 10*3/uL (ref 3.4–10.8)

## 2017-12-01 LAB — TSH: TSH: 1.75 u[IU]/mL (ref 0.450–4.500)

## 2017-12-01 MED ORDER — EZETIMIBE 10 MG PO TABS: 10.0000 mg | ORAL_TABLET | Freq: Every day | ORAL | 1 refills | Status: DC

## 2017-12-02 ENCOUNTER — Ambulatory Visit (HOSPITAL_BASED_OUTPATIENT_CLINIC_OR_DEPARTMENT_OTHER)
Admission: RE | Admit: 2017-12-02 | Discharge: 2017-12-02 | Disposition: A | Discharge status: Home / Self Care | Payer: Medicare Other | Source: Ambulatory Visit | Attending: Cardiology | Admitting: Cardiology

## 2017-12-02 ENCOUNTER — Encounter (HOSPITAL_BASED_OUTPATIENT_CLINIC_OR_DEPARTMENT_OTHER): Payer: Self-pay

## 2017-12-02 DIAGNOSIS — R918 Other nonspecific abnormal finding of lung field: Secondary | ICD-10-CM | POA: Diagnosis not present

## 2017-12-02 DIAGNOSIS — I712 Thoracic aortic aneurysm, without rupture: Secondary | ICD-10-CM | POA: Insufficient documentation

## 2017-12-02 MED ORDER — IOPAMIDOL (ISOVUE-370) INJECTION 76%
100.0000 mL | Freq: Once | INTRAVENOUS | Status: AC | PRN
  Administered 2017-12-02: 100 mL via INTRAVENOUS

## 2017-12-03 ENCOUNTER — Telehealth: Payer: Self-pay

## 2017-12-03 NOTE — Telephone Encounter (Signed)
Left voicemail for the patinet to call the office regarding his recent chest CT.

## 2017-12-05 ENCOUNTER — Ambulatory Visit
Admission: RE | Admit: 2017-12-05 | Discharge: 2017-12-05 | Disposition: A | Discharge status: Home / Self Care | Payer: Medicare Other | Source: Ambulatory Visit | Attending: Neurosurgery | Admitting: Neurosurgery

## 2017-12-05 DIAGNOSIS — M48062 Spinal stenosis, lumbar region with neurogenic claudication: Secondary | ICD-10-CM

## 2017-12-05 MED ORDER — GADOBENATE DIMEGLUMINE 529 MG/ML IV SOLN
20.0000 mL | Freq: Once | INTRAVENOUS | Status: AC | PRN
  Administered 2017-12-05: 20 mL via INTRAVENOUS

## 2017-12-15 ENCOUNTER — Telehealth (HOSPITAL_COMMUNITY): Payer: Self-pay | Admitting: *Deleted

## 2017-12-15 NOTE — Telephone Encounter (Signed)
Patient given detailed instructions per Myocardial Perfusion Study Information Sheet for the test on 12/21/17 at 1300. Patient notified to arrive 15 minutes early and that it is imperative to arrive on time for appointment to keep from having the test rescheduled.  If you need to cancel or reschedule your appointment, please call the office within 24 hours of your appointment. . Patient verbalized understanding.Wolfgang Finigan, Ranae [place]

## 2017-12-21 ENCOUNTER — Ambulatory Visit (HOSPITAL_COMMUNITY): Payer: Medicare Other | Attending: Internal Medicine

## 2017-12-21 VITALS — Ht 69.0 in | Wt 317.0 lb

## 2017-12-21 DIAGNOSIS — I25118 Atherosclerotic heart disease of native coronary artery with other forms of angina pectoris: Secondary | ICD-10-CM | POA: Diagnosis present

## 2017-12-21 DIAGNOSIS — R079 Chest pain, unspecified: Secondary | ICD-10-CM | POA: Insufficient documentation

## 2017-12-21 DIAGNOSIS — I2510 Atherosclerotic heart disease of native coronary artery without angina pectoris: Secondary | ICD-10-CM | POA: Diagnosis present

## 2017-12-21 MED ORDER — REGADENOSON 0.4 MG/5ML IV SOLN
0.4000 mg | Freq: Once | INTRAVENOUS | Status: AC
  Administered 2017-12-21: 0.4 mg via INTRAVENOUS

## 2017-12-21 MED ORDER — TECHNETIUM TC 99M TETROFOSMIN IV KIT
31.2000 | PACK | Freq: Once | INTRAVENOUS | Status: AC | PRN
  Administered 2017-12-21: 31.2 via INTRAVENOUS
  Filled 2017-12-21: qty 32

## 2017-12-22 ENCOUNTER — Ambulatory Visit (HOSPITAL_COMMUNITY): Payer: Medicare Other | Attending: Cardiovascular Disease

## 2017-12-22 LAB — MYOCARDIAL PERFUSION IMAGING
LV dias vol: 101 mL (ref 62–150)
LV sys vol: 68 mL
Peak HR: 86 {beats}/min
Rest HR: 73 {beats}/min
SDS: 1
SRS: 0
SSS: 1
TID: 1.34

## 2017-12-22 MED ORDER — TECHNETIUM TC 99M TETROFOSMIN IV KIT
30.7000 | PACK | Freq: Once | INTRAVENOUS | Status: DC | PRN
  Filled 2017-12-22: qty 31

## 2017-12-24 ENCOUNTER — Other Ambulatory Visit: Payer: Self-pay

## 2017-12-24 DIAGNOSIS — M461 Sacroiliitis, not elsewhere classified: Secondary | ICD-10-CM | POA: Insufficient documentation

## 2017-12-24 DIAGNOSIS — R931 Abnormal findings on diagnostic imaging of heart and coronary circulation: Secondary | ICD-10-CM

## 2017-12-24 HISTORY — DX: Sacroiliitis, not elsewhere classified: M46.1

## 2017-12-28 ENCOUNTER — Ambulatory Visit (HOSPITAL_COMMUNITY): Payer: Medicare Other | Attending: Cardiology

## 2017-12-28 ENCOUNTER — Other Ambulatory Visit: Payer: Self-pay

## 2017-12-28 ENCOUNTER — Other Ambulatory Visit (HOSPITAL_COMMUNITY): Payer: Medicare Other

## 2017-12-28 DIAGNOSIS — R931 Abnormal findings on diagnostic imaging of heart and coronary circulation: Secondary | ICD-10-CM | POA: Diagnosis present

## 2017-12-28 MED ORDER — PERFLUTREN LIPID MICROSPHERE
1.0000 mL | INTRAVENOUS | Status: AC | PRN
  Administered 2017-12-28: 1 mL via INTRAVENOUS

## 2018-01-11 ENCOUNTER — Other Ambulatory Visit (HOSPITAL_COMMUNITY): Payer: Self-pay | Admitting: Orthopedic Surgery

## 2018-01-20 ENCOUNTER — Encounter (HOSPITAL_BASED_OUTPATIENT_CLINIC_OR_DEPARTMENT_OTHER): Payer: Self-pay

## 2018-01-20 ENCOUNTER — Other Ambulatory Visit: Payer: Self-pay

## 2018-01-28 ENCOUNTER — Other Ambulatory Visit: Payer: Self-pay

## 2018-01-28 ENCOUNTER — Ambulatory Visit (HOSPITAL_BASED_OUTPATIENT_CLINIC_OR_DEPARTMENT_OTHER): Payer: Medicare Other | Admitting: Anesthesiology

## 2018-01-28 ENCOUNTER — Encounter (HOSPITAL_BASED_OUTPATIENT_CLINIC_OR_DEPARTMENT_OTHER): Payer: Self-pay | Admitting: *Deleted

## 2018-01-28 ENCOUNTER — Ambulatory Visit (HOSPITAL_BASED_OUTPATIENT_CLINIC_OR_DEPARTMENT_OTHER)
Admission: RE | Admit: 2018-01-28 | Discharge: 2018-01-28 | Disposition: A | Discharge status: Home / Self Care | Payer: Medicare Other | Attending: Orthopedic Surgery | Admitting: Orthopedic Surgery

## 2018-01-28 ENCOUNTER — Encounter (HOSPITAL_BASED_OUTPATIENT_CLINIC_OR_DEPARTMENT_OTHER)
Admission: RE | Disposition: A | Discharge status: Home / Self Care | Payer: Self-pay | Source: Home / Self Care | Attending: Orthopedic Surgery

## 2018-01-28 DIAGNOSIS — Z7902 Long term (current) use of antithrombotics/antiplatelets: Secondary | ICD-10-CM | POA: Insufficient documentation

## 2018-01-28 DIAGNOSIS — Z955 Presence of coronary angioplasty implant and graft: Secondary | ICD-10-CM | POA: Insufficient documentation

## 2018-01-28 DIAGNOSIS — I10 Essential (primary) hypertension: Secondary | ICD-10-CM | POA: Diagnosis not present

## 2018-01-28 DIAGNOSIS — Z888 Allergy status to other drugs, medicaments and biological substances status: Secondary | ICD-10-CM | POA: Insufficient documentation

## 2018-01-28 DIAGNOSIS — E785 Hyperlipidemia, unspecified: Secondary | ICD-10-CM | POA: Diagnosis not present

## 2018-01-28 DIAGNOSIS — E11610 Type 2 diabetes mellitus with diabetic neuropathic arthropathy: Secondary | ICD-10-CM | POA: Diagnosis not present

## 2018-01-28 DIAGNOSIS — I2510 Atherosclerotic heart disease of native coronary artery without angina pectoris: Secondary | ICD-10-CM | POA: Insufficient documentation

## 2018-01-28 DIAGNOSIS — Z6841 Body Mass Index (BMI) 40.0 and over, adult: Secondary | ICD-10-CM | POA: Insufficient documentation

## 2018-01-28 DIAGNOSIS — E11621 Type 2 diabetes mellitus with foot ulcer: Secondary | ICD-10-CM | POA: Insufficient documentation

## 2018-01-28 DIAGNOSIS — Z7982 Long term (current) use of aspirin: Secondary | ICD-10-CM | POA: Insufficient documentation

## 2018-01-28 DIAGNOSIS — E08621 Diabetes mellitus due to underlying condition with foot ulcer: Secondary | ICD-10-CM

## 2018-01-28 DIAGNOSIS — L89899 Pressure ulcer of other site, unspecified stage: Secondary | ICD-10-CM | POA: Diagnosis not present

## 2018-01-28 DIAGNOSIS — K219 Gastro-esophageal reflux disease without esophagitis: Secondary | ICD-10-CM | POA: Diagnosis not present

## 2018-01-28 DIAGNOSIS — L97421 Non-pressure chronic ulcer of left heel and midfoot limited to breakdown of skin: Secondary | ICD-10-CM

## 2018-01-28 DIAGNOSIS — Z79899 Other long term (current) drug therapy: Secondary | ICD-10-CM | POA: Insufficient documentation

## 2018-01-28 DIAGNOSIS — Z8249 Family history of ischemic heart disease and other diseases of the circulatory system: Secondary | ICD-10-CM | POA: Diagnosis not present

## 2018-01-28 HISTORY — PX: BONE EXOSTOSIS EXCISION: SHX1249

## 2018-01-28 SURGERY — EXCISION, EXOSTOSIS
Anesthesia: General | Site: Foot | Laterality: Left

## 2018-01-28 MED ORDER — DEXTROSE 5 % IV SOLN
3.0000 g | Freq: Once | INTRAVENOUS | Status: AC
  Administered 2018-01-28: 3 g via INTRAVENOUS

## 2018-01-28 MED ORDER — ONDANSETRON HCL 4 MG/2ML IJ SOLN: 4.0000 mg | Freq: Once | INTRAMUSCULAR | Status: DC | PRN

## 2018-01-28 MED ORDER — ONDANSETRON HCL 4 MG/2ML IJ SOLN
INTRAMUSCULAR | Status: AC
  Filled 2018-01-28: qty 2

## 2018-01-28 MED ORDER — ACETAMINOPHEN 160 MG/5ML PO SOLN: 325.0000 mg | ORAL | Status: DC | PRN

## 2018-01-28 MED ORDER — DEXAMETHASONE SODIUM PHOSPHATE 4 MG/ML IJ SOLN
INTRAMUSCULAR | Status: DC | PRN
  Administered 2018-01-28: 10 mg via INTRAVENOUS

## 2018-01-28 MED ORDER — CEFAZOLIN SODIUM-DEXTROSE 2-4 GM/100ML-% IV SOLN: 2.0000 g | INTRAVENOUS | Status: DC

## 2018-01-28 MED ORDER — LIDOCAINE 2% (20 MG/ML) 5 ML SYRINGE
INTRAMUSCULAR | Status: AC
  Filled 2018-01-28: qty 5

## 2018-01-28 MED ORDER — OXYCODONE HCL 5 MG PO TABS: 5.0000 mg | ORAL_TABLET | Freq: Once | ORAL | Status: DC | PRN

## 2018-01-28 MED ORDER — TRAMADOL HCL 50 MG PO TABS: 50.0000 mg | ORAL_TABLET | Freq: Four times a day (QID) | ORAL | 0 refills | Status: AC | PRN

## 2018-01-28 MED ORDER — CEFAZOLIN SODIUM-DEXTROSE 2-4 GM/100ML-% IV SOLN
INTRAVENOUS | Status: AC
  Filled 2018-01-28: qty 200

## 2018-01-28 MED ORDER — SODIUM CHLORIDE 0.9 % IV SOLN: INTRAVENOUS | Status: DC

## 2018-01-28 MED ORDER — ROPIVACAINE HCL 7.5 MG/ML IJ SOLN
INTRAMUSCULAR | Status: DC | PRN
  Administered 2018-01-28: 30 mL via PERINEURAL

## 2018-01-28 MED ORDER — DEXAMETHASONE SODIUM PHOSPHATE 10 MG/ML IJ SOLN
INTRAMUSCULAR | Status: AC
  Filled 2018-01-28: qty 1

## 2018-01-28 MED ORDER — MIDAZOLAM HCL 2 MG/2ML IJ SOLN
INTRAMUSCULAR | Status: AC
  Filled 2018-01-28: qty 2

## 2018-01-28 MED ORDER — FENTANYL CITRATE (PF) 100 MCG/2ML IJ SOLN
INTRAMUSCULAR | Status: AC
  Filled 2018-01-28: qty 2

## 2018-01-28 MED ORDER — 0.9 % SODIUM CHLORIDE (POUR BTL) OPTIME
TOPICAL | Status: DC | PRN
  Administered 2018-01-28: 400 mL

## 2018-01-28 MED ORDER — OXYCODONE HCL 5 MG/5ML PO SOLN: 5.0000 mg | Freq: Once | ORAL | Status: DC | PRN

## 2018-01-28 MED ORDER — PROPOFOL 10 MG/ML IV BOLUS
INTRAVENOUS | Status: DC | PRN
  Administered 2018-01-28: 200 mg via INTRAVENOUS

## 2018-01-28 MED ORDER — VANCOMYCIN HCL 500 MG IV SOLR
INTRAVENOUS | Status: DC | PRN
  Administered 2018-01-28: 250 mg via TOPICAL

## 2018-01-28 MED ORDER — LACTATED RINGERS IV SOLN
INTRAVENOUS | Status: DC
  Administered 2018-01-28: 12:00:00 via INTRAVENOUS

## 2018-01-28 MED ORDER — ONDANSETRON HCL 4 MG/2ML IJ SOLN
INTRAMUSCULAR | Status: DC | PRN
  Administered 2018-01-28: 4 mg via INTRAVENOUS

## 2018-01-28 MED ORDER — MIDAZOLAM HCL 2 MG/2ML IJ SOLN
1.0000 mg | INTRAMUSCULAR | Status: DC | PRN
  Administered 2018-01-28: 2 mg via INTRAVENOUS

## 2018-01-28 MED ORDER — FENTANYL CITRATE (PF) 100 MCG/2ML IJ SOLN
50.0000 ug | INTRAMUSCULAR | Status: DC | PRN
  Administered 2018-01-28: 100 ug via INTRAVENOUS

## 2018-01-28 MED ORDER — VANCOMYCIN HCL 500 MG IV SOLR
INTRAVENOUS | Status: AC
  Filled 2018-01-28: qty 500

## 2018-01-28 MED ORDER — SCOPOLAMINE 1 MG/3DAYS TD PT72: 1.0000 | MEDICATED_PATCH | Freq: Once | TRANSDERMAL | Status: DC | PRN

## 2018-01-28 MED ORDER — CHLORHEXIDINE GLUCONATE 4 % EX LIQD: 60.0000 mL | Freq: Once | CUTANEOUS | Status: DC

## 2018-01-28 MED ORDER — LIDOCAINE 2% (20 MG/ML) 5 ML SYRINGE
INTRAMUSCULAR | Status: DC | PRN
  Administered 2018-01-28: 40 mg via INTRAVENOUS

## 2018-01-28 MED ORDER — FENTANYL CITRATE (PF) 100 MCG/2ML IJ SOLN: 25.0000 ug | INTRAMUSCULAR | Status: DC | PRN

## 2018-01-28 MED ORDER — ACETAMINOPHEN 325 MG PO TABS: 325.0000 mg | ORAL_TABLET | ORAL | Status: DC | PRN

## 2018-01-28 MED ORDER — CLONIDINE HCL (ANALGESIA) 100 MCG/ML EP SOLN
EPIDURAL | Status: DC | PRN
  Administered 2018-01-28: 100 ug

## 2018-01-28 MED ORDER — MEPERIDINE HCL 25 MG/ML IJ SOLN: 6.2500 mg | INTRAMUSCULAR | Status: DC | PRN

## 2018-01-28 SURGICAL SUPPLY — 71 items
BANDAGE ACE 4X5 VEL STRL LF (GAUZE/BANDAGES/DRESSINGS) ×1 IMPLANT
BANDAGE ESMARK 6X9 LF (GAUZE/BANDAGES/DRESSINGS) IMPLANT
BLADE AVERAGE 25X9 (BLADE) IMPLANT
BLADE MINI RND TIP GREEN BEAV (BLADE) IMPLANT
BLADE OSC/SAG .038X5.5 CUT EDG (BLADE) IMPLANT
BLADE SURG 15 STRL LF DISP TIS (BLADE) ×2 IMPLANT
BLADE SURG 15 STRL SS (BLADE) ×3
BNDG COHESIVE 4X5 TAN STRL (GAUZE/BANDAGES/DRESSINGS) IMPLANT
BNDG COHESIVE 6X5 TAN STRL LF (GAUZE/BANDAGES/DRESSINGS) IMPLANT
BNDG ESMARK 4X9 LF (GAUZE/BANDAGES/DRESSINGS) IMPLANT
BNDG ESMARK 6X9 LF (GAUZE/BANDAGES/DRESSINGS)
BOOT STEPPER DURA LG (SOFTGOODS) ×1 IMPLANT
BOOT STEPPER DURA MED (SOFTGOODS) IMPLANT
CANISTER SUCT 1200ML W/VALVE (MISCELLANEOUS) IMPLANT
CHLORAPREP W/TINT 26ML (MISCELLANEOUS) ×1 IMPLANT
COVER BACK TABLE 60X90IN (DRAPES) ×2 IMPLANT
COVER WAND RF STERILE (DRAPES) IMPLANT
CUFF TOURNIQUET SINGLE 24IN (TOURNIQUET CUFF) ×2 IMPLANT
CUFF TOURNIQUET SINGLE 34IN LL (TOURNIQUET CUFF) ×1 IMPLANT
DRAPE EXTREMITY T 121X128X90 (DISPOSABLE) ×2 IMPLANT
DRAPE OEC MINIVIEW 54X84 (DRAPES) IMPLANT
DRAPE SURG 17X23 STRL (DRAPES) IMPLANT
DRAPE U-SHAPE 47X51 STRL (DRAPES) ×2 IMPLANT
DRSG MEPITEL 4X7.2 (GAUZE/BANDAGES/DRESSINGS) ×2 IMPLANT
DRSG PAD ABDOMINAL 8X10 ST (GAUZE/BANDAGES/DRESSINGS) ×2 IMPLANT
ELECT REM PT RETURN 9FT ADLT (ELECTROSURGICAL) ×2
ELECTRODE REM PT RTRN 9FT ADLT (ELECTROSURGICAL) ×1 IMPLANT
GAUZE SPONGE 4X4 12PLY STRL (GAUZE/BANDAGES/DRESSINGS) ×4 IMPLANT
GLOVE BIO SURGEON STRL SZ8 (GLOVE) ×2 IMPLANT
GLOVE BIOGEL PI IND STRL 7.0 (GLOVE) IMPLANT
GLOVE BIOGEL PI IND STRL 8 (GLOVE) ×2 IMPLANT
GLOVE BIOGEL PI INDICATOR 7.0 (GLOVE) ×2
GLOVE BIOGEL PI INDICATOR 8 (GLOVE) ×1
GLOVE ECLIPSE 6.5 STRL STRAW (GLOVE) ×1 IMPLANT
GLOVE ECLIPSE 8.0 STRL XLNG CF (GLOVE) ×1 IMPLANT
GOWN STRL REUS W/ TWL LRG LVL3 (GOWN DISPOSABLE) ×1 IMPLANT
GOWN STRL REUS W/ TWL XL LVL3 (GOWN DISPOSABLE) ×2 IMPLANT
GOWN STRL REUS W/TWL LRG LVL3 (GOWN DISPOSABLE) ×1
GOWN STRL REUS W/TWL XL LVL3 (GOWN DISPOSABLE) ×1
K-WIRE DBL END .054 LG (WIRE) IMPLANT
NDL HYPO 25X1 1.5 SAFETY (NEEDLE) IMPLANT
NEEDLE HYPO 22GX1.5 SAFETY (NEEDLE) ×1 IMPLANT
NEEDLE HYPO 25X1 1.5 SAFETY (NEEDLE) IMPLANT
NS IRRIG 1000ML POUR BTL (IV SOLUTION) ×2 IMPLANT
PACK BASIN DAY SURGERY FS (CUSTOM PROCEDURE TRAY) ×2 IMPLANT
PAD CAST 4YDX4 CTTN HI CHSV (CAST SUPPLIES) ×1 IMPLANT
PADDING CAST COTTON 4X4 STRL (CAST SUPPLIES) ×1
PADDING CAST COTTON 6X4 STRL (CAST SUPPLIES) IMPLANT
PENCIL BUTTON HOLSTER BLD 10FT (ELECTRODE) ×2 IMPLANT
SANITIZER HAND PURELL 535ML FO (MISCELLANEOUS) ×1 IMPLANT
SHEET MEDIUM DRAPE 40X70 STRL (DRAPES) ×2 IMPLANT
SLEEVE SCD COMPRESS KNEE MED (MISCELLANEOUS) ×2 IMPLANT
SPLINT FAST PLASTER 5X30 (CAST SUPPLIES)
SPLINT PLASTER CAST FAST 5X30 (CAST SUPPLIES) IMPLANT
SPONGE LAP 18X18 RF (DISPOSABLE) ×2 IMPLANT
SPONGE SURGIFOAM ABS GEL 12-7 (HEMOSTASIS) IMPLANT
STOCKINETTE 6  STRL (DRAPES) ×1
STOCKINETTE 6 STRL (DRAPES) ×1 IMPLANT
SUCTION FRAZIER HANDLE 10FR (MISCELLANEOUS) ×1
SUCTION TUBE FRAZIER 10FR DISP (MISCELLANEOUS) IMPLANT
SUT ETHILON 3 0 PS 1 (SUTURE) ×3 IMPLANT
SUT MNCRL AB 3-0 PS2 18 (SUTURE) ×1 IMPLANT
SUT VIC AB 0 SH 27 (SUTURE) IMPLANT
SUT VIC AB 2-0 SH 27 (SUTURE)
SUT VIC AB 2-0 SH 27XBRD (SUTURE) IMPLANT
SYR BULB 3OZ (MISCELLANEOUS) ×2 IMPLANT
SYR CONTROL 10ML LL (SYRINGE) IMPLANT
TOWEL GREEN STERILE FF (TOWEL DISPOSABLE) ×2 IMPLANT
TUBE CONNECTING 20X1/4 (TUBING) ×1 IMPLANT
UNDERPAD 30X30 (UNDERPADS AND DIAPERS) ×2 IMPLANT
YANKAUER SUCT BULB TIP NO VENT (SUCTIONS) IMPLANT

## 2018-01-28 NOTE — Anesthesia Procedure Notes (Signed)
Procedure Name: LMA Insertion Performed by: Jeneal Vogl W, CRNA Pre-anesthesia Checklist: Patient identified, Emergency Drugs available, Suction available and Patient being monitored Patient Re-evaluated:Patient Re-evaluated prior to induction Oxygen Delivery Method: Circle system utilized Preoxygenation: Pre-oxygenation with 100% oxygen Induction Type: IV induction Ventilation: Mask ventilation without difficulty LMA: LMA inserted LMA Size: 4.0 Number of attempts: 1 Placement Confirmation: positive ETCO2 Tube secured with: Tape Dental Injury: Teeth and Oropharynx as per pre-operative assessment        

## 2018-01-28 NOTE — Anesthesia Postprocedure Evaluation (Signed)
Anesthesia Post Note  Patient: Stephen Medina  Procedure(s) Performed: Exostectomy left first metatarsal base and medial cuneiform (Left Foot)     Patient location during evaluation: PACU Anesthesia Type: General Level of consciousness: awake and alert Pain management: pain level controlled Vital Signs Assessment: post-procedure vital signs reviewed and stable Respiratory status: spontaneous breathing, nonlabored ventilation, respiratory function stable and patient connected to nasal cannula oxygen Cardiovascular status: blood pressure returned to baseline and stable Postop Assessment: no apparent nausea or vomiting Anesthetic complications: no    Last Vitals:  Vitals:   01/28/18 1530 01/28/18 1600  BP: 118/68 132/73  Pulse: (!) 55 (!) 51  Resp: 20 16  Temp:  36.5 C  SpO2: 96% 100%    Last Pain:  Vitals:   01/28/18 1600  TempSrc:   PainSc: 0-No pain                 Fountain Derusha

## 2018-01-28 NOTE — Progress Notes (Signed)
Assisted Dr. Oddono with left, ultrasound guided, popliteal block. Side rails up, monitors on throughout procedure. See vital signs in flow sheet. Tolerated Procedure well. 

## 2018-01-28 NOTE — Anesthesia Procedure Notes (Signed)
Anesthesia Regional Block: Popliteal block   Pre-Anesthetic Checklist: ,, timeout performed, Correct Patient, Correct Site, Correct Laterality, Correct Procedure, Correct Position, site marked, Risks and benefits discussed,  Surgical consent,  Pre-op evaluation,  At surgeon's request and post-op pain management  Laterality: Left  Prep: chloraprep       Needles:  Injection technique: Single-shot  Needle Type: Echogenic Stimulator Needle     Needle Length: 5cm  Needle Gauge: 22     Additional Needles:   Procedures:, nerve stimulator,,, ultrasound used (permanent image in chart),,,,  Narrative:  Start time: 01/28/2018 12:30 PM End time: 01/28/2018 12:40 PM Injection made incrementally with aspirations every 5 mL.  Performed by: Personally  Anesthesiologist: Janeece Riggers, MD  Additional Notes: Functioning IV was confirmed and monitors were applied.  A 37mm 22ga Arrow echogenic stimulator needle was used. Sterile prep and drape,hand hygiene and sterile gloves were used. Ultrasound guidance: relevant anatomy identified, needle position confirmed, local anesthetic spread visualized around nerve(s)., vascular puncture avoided.  Image printed for medical record. Negative aspiration and negative test dose prior to incremental administration of local anesthetic. The patient tolerated the procedure well.

## 2018-01-28 NOTE — Op Note (Signed)
01/28/2018  3:20 PM  PATIENT:  Jamse Mead Hixon  67 y.o. male  PRE-OPERATIVE DIAGNOSIS:   Left Foot chronic ulcer and Charcot foot deformity  POST-OPERATIVE DIAGNOSIS:   Left Foot chronic ulcer and Charcot foot deformity  Procedure(s): 1.  Exostectomy left first metatarsal base 2.  Exostectomy left medial cuneiform 3.  Excision of plantar ulcer  SURGEON:  Wylene Simmer, MD  ASSISTANT: None  ANESTHESIA:   General, regional  EBL:  minimal   TOURNIQUET:   Total Tourniquet Time Documented: Thigh (Left) - 18 minutes Total: Thigh (Left) - 18 minutes  COMPLICATIONS:  None apparent  DISPOSITION:  Extubated, awake and stable to recovery.  INDICATION FOR PROCEDURE: The patient is a 68 year old male with a past medical history significant for diabetes and left Charcot foot deformity.  He has a chronic plantar ulcer beneath the first TMT joint.  He has not healed despite wound care and offloading.  He presents now for plantar exostectomy and excision of the ulcer.  The risks and benefits of the alternative treatment options have been discussed in detail.  The patient wishes to proceed with surgery and specifically understands risks of bleeding, infection, nerve damage, blood clots, need for additional surgery, amputation and death.  PROCEDURE IN DETAIL:  After pre operative consent was obtained, and the correct operative site was identified, the patient was brought to the operating room and placed supine on the OR table.  Anesthesia was administered.  Pre-operative antibiotics were administered.  A surgical timeout was taken.  The left lower extremity was prepped and draped in standard sterile fashion with a tourniquet around the thigh.  The extremity was elevated and the tourniquet was inflated to 250 mmHg.  The plantar ulcer was identified.  An incision was made proximal and distal to the ulcer excising it entirely.  Dissection was carried down through the subcutaneous tissues to the plantar  aspect of the first TMT joint.  Adjacent tendons were protected and the periosteum was incised.  The prominent plantar bone was resected with a rondure at the first metatarsal base and the plantar surface of the medial cuneiform..  The cut surface of bone was smoothed and irrigated copiously.  Vancomycin powder was placed in the base of the wound.  The incision was then closed with simple and horizontal mattress sutures of 3-0 nylon.  Sterile dressings were applied followed by a compression wrap.  The tourniquet was released after application of the dressings.  A cam boot was applied.  The patient was awakened from the anesthetic and transported to the recovery room in stable condition.  FOLLOW UP PLAN: Weightbearing as tolerated in a cam boot.  Follow-up in 2 weeks for suture removal.

## 2018-01-28 NOTE — Anesthesia Preprocedure Evaluation (Signed)
Anesthesia Evaluation  Patient identified by MRN, date of birth, ID band Patient awake    Reviewed: Allergy & Precautions, NPO status , Patient's Chart, lab work & pertinent test results, reviewed documented beta blocker date and time   Airway Mallampati: III  TM Distance: >3 FB Neck ROM: Full    Dental  (+) Dental Advisory Given   Pulmonary neg pulmonary ROS,    breath sounds clear to auscultation       Cardiovascular hypertension, Pt. on medications and Pt. on home beta blockers + CAD   Rhythm:Regular Rate:Normal     Neuro/Psych negative neurological ROS     GI/Hepatic Neg liver ROS, GERD  ,  Endo/Other  Morbid obesity  Renal/GU negative Renal ROS     Musculoskeletal  (+) Arthritis ,   Abdominal (+) + obese,   Peds  Hematology negative hematology ROS (+)   Anesthesia Other Findings   Reproductive/Obstetrics                            Anesthesia Physical  Anesthesia Plan  ASA: III  Anesthesia Plan: General   Post-op Pain Management: GA combined w/ Regional for post-op pain   Induction: Intravenous  PONV Risk Score and Plan:   Airway Management Planned: LMA  Additional Equipment:   Intra-op Plan:   Post-operative Plan: Extubation in OR  Informed Consent: I have reviewed the patients History and Physical, chart, labs and discussed the procedure including the risks, benefits and alternatives for the proposed anesthesia with the patient or authorized representative who has indicated his/her understanding and acceptance.     Dental advisory given  Plan Discussed with:   Anesthesia Plan Comments: (Discussed both nerve block for pain relief post-op and GA; including NV, sore throat, dental injury, and pulmonary complications)        Anesthesia Quick Evaluation

## 2018-01-28 NOTE — H&P (Signed)
Stephen Medina is an 68 y.o. male.   Chief Complaint: Left plantar foot ulcer HPI: The patient is a 68 year old male with a past medical history significant for diabetes and Charcot arthropathy of his midfoot.  He has a chronic ulcer under the first TMT joint.  He has failed nonoperative treatment to date including activity modification, offloading, wound care and antibiotics.  He presents now for excision of the ulcer and exostectomy at the first TMT joint.  Past Medical History:  Diagnosis Date  . CAD (coronary artery disease) 08/01/2013   Cath 2005  normal Left main, scattered irregularities LAD, 80% stenosis ostial Diag 1, scattered irregularities CFX, scattered irregularities RCA  Taxus stent March 2005 Diagonal   . Disc displacement, lumbar   . Foot ulceration ([place])    left  . GERD (gastroesophageal reflux disease)   . History of ulcer disease 10/19/2013  . Hyperlipidemia   . Hypertension   . Lumbar spondylosis 04/01/2013  . Morbid obesity ([place])   . Wears glasses     Past Surgical History:  Procedure Laterality Date  . APPENDECTOMY    . BACK SURGERY    . CARDIAC CATHETERIZATION     05    1 stent   dr Wynonia Lawman  . ESOPHAGOGASTRODUODENOSCOPY N/A 08/02/2013   Procedure: ESOPHAGOGASTRODUODENOSCOPY (EGD);  Surgeon: Ladene Artist, MD;  Location: [facility] ENDOSCOPY;  Service: Endoscopy;  Laterality: N/A;  . FOOT ARTHRODESIS Left 05/22/2016   Procedure: Left Subtalar and Talonavicular Joint Arthrodesis;  Surgeon: Wylene Simmer, MD;  Location: [place];  Service: Orthopedics;  Laterality: Left;  . FOOT SURGERY    . GASTROCNEMIUS RECESSION Left 05/22/2016   Procedure: Left Gastroc Recession;  Surgeon: Wylene Simmer, MD;  Location: [place];  Service: Orthopedics;  Laterality: Left;  Marland Kitchen GRAFT APPLICATION N/A 06/17/4648   Procedure: INTEGRA GRAFT APPLICATION;  Surgeon: Trula Slade, DPM;  Location: [place];  Service: Podiatry;  Laterality: N/A;  .  LEFT HEART CATHETERIZATION WITH CORONARY ANGIOGRAM N/A 02/09/2014   Procedure: LEFT HEART CATHETERIZATION WITH CORONARY ANGIOGRAM;  Surgeon: Peter M Martinique, MD;  Location: [facility] CATH LAB;  Service: Cardiovascular;  Laterality: N/A;  . LUMBAR LAMINECTOMY     x2  . LUMBAR LAMINECTOMY/DECOMPRESSION MICRODISCECTOMY Left 09/03/2015   Procedure: MICRODISCECTOMY L5-S1 LEFT;  Surgeon: Ashok Pall, MD;  Location: [place] NEURO ORS;  Service: Neurosurgery;  Laterality: Left;   MICRODISCECTOMY L5-S1 LEFT  . LUMBAR LAMINECTOMY/DECOMPRESSION MICRODISCECTOMY Left 02/13/2016   Procedure: MICRODISCECTOMY LUMBAR FIVE- SACRAL ONE LEFT;  Surgeon: Ashok Pall, MD;  Location: [place];  Service: Neurosurgery;  Laterality: Left;  . TONSILLECTOMY    . WOUND DEBRIDEMENT N/A 06/22/2015   Procedure: DEBRIDEMENT WOUND;  Surgeon: Trula Slade, DPM;  Location: [place];  Service: Podiatry;  Laterality: N/A;    Family History  Problem Relation Age of Onset  . Coronary artery disease Father        open heart surgery   . Cancer Brother   . CAD Cousin        requiring stent  . Colon cancer Neg Hx   . Pancreatic cancer Neg Hx   . Rectal cancer Neg Hx   . Stomach cancer Neg Hx    Social History:  reports that he has never smoked. He has quit using smokeless tobacco.  His smokeless tobacco use included chew. He reports that he does not drink alcohol or use drugs.  Allergies:  Allergies  Allergen Reactions  .  Adhesive [Tape] Other (See Comments)    Blisters.  Paper tape ok.    Medications Prior to Admission  Medication Sig Dispense Refill  . aspirin 325 MG tablet Take 325 mg by mouth daily.     . clopidogrel (PLAVIX) 75 MG tablet Take 75 mg by mouth daily.    Marland Kitchen docusate sodium (COLACE) 100 MG capsule Take 1 capsule (100 mg total) by mouth 2 (two) times daily. While taking narcotic pain medicine. 30 capsule 0  . ezetimibe (ZETIA) 10 MG tablet Take 1 tablet (10 mg total) by mouth daily. 90 tablet 1  .  isosorbide mononitrate (IMDUR) 30 MG 24 hr tablet Take 30 mg by mouth daily.    . metoprolol succinate (TOPROL-XL) 25 MG 24 hr tablet Take 25 mg by mouth daily.   3  . pantoprazole (PROTONIX) 40 MG tablet Take 1 tablet (40 mg total) by mouth daily. 30 tablet 11  . rosuvastatin (CRESTOR) 40 MG tablet Take 20 mg by mouth daily.     . nitroGLYCERIN (NITROSTAT) 0.4 MG SL tablet Place 1 tablet (0.4 mg total) under the tongue every 5 (five) minutes as needed. 25 tablet 11    No results found for this or any previous visit (from the past 48 hour(s)). No results found.  ROS no recent fever, chills, nausea, vomiting or changes in his appetite  Blood pressure 98/64, pulse 60, temperature 97.6 F (36.4 C), temperature source Oral, resp. rate 13, height 5\' 11"  (1.803 m), weight (!) 139.6 kg, SpO2 98 %. Physical Exam  Well-nourished well-developed man in no apparent distress.  Alert and oriented x4.  Mood and affect are normal.  Extraocular motions are intact.  Respirations are unlabored.  Gait is normal.  The right foot has a 2 cm plantar ulcer beneath the first TMT joint.  No evidence of any infection.  Skin is otherwise intact.  Diminished sensibility to light touch at the forefoot.  Assessment/Plan Chronic left foot plantar diabetic ulcer -to the operating room today for excision of the ulcer and exostectomy.  The risks and benefits of the alternative treatment options have been discussed in detail.  The patient wishes to proceed with surgery and specifically understands risks of bleeding, infection, nerve damage, blood clots, need for additional surgery, amputation and death.   Wylene Simmer, MD 02-15-18, 2:02 PM

## 2018-01-28 NOTE — Discharge Instructions (Signed)
Stephen Simmer, MD [facility]  Please read the following information regarding your care after surgery.  Medications  You only need a prescription for the narcotic pain medicine (ex. oxycodone, Percocet, Norco).  All of the other medicines listed below are available over the counter.  X acetominophen (Tylenol) 650 mg every 4-6 hours as you need for minor to moderate pain X tramadol as prescribed for severe pain  X To help prevent blood clots, resume your plavix.  You should also get up every hour while you are awake to move around.    Weight Bearing ? Bear weight when you are able on your operated leg or foot. X Bear weight only on the heel of your operated foot in the post-op boot. ? Do not bear any weight on the operated leg or foot.  Cast / Splint / Dressing X Keep your splint, cast or dressing clean and dry.  Dont put anything (coat hanger, pencil, etc) down inside of it.  If it gets damp, use a hair dryer on the cool setting to dry it.  If it gets soaked, call the office to schedule an appointment for a cast change. ? Remove your dressing 3 days after surgery and cover the incisions with dry dressings.    After your dressing, cast or splint is removed; you may shower, but do not soak or scrub the wound.  Allow the water to run over it, and then gently pat it dry.  Swelling It is normal for you to have swelling where you had surgery.  To reduce swelling and pain, keep your toes above your nose for at least 3 days after surgery.  It may be necessary to keep your foot or leg elevated for several weeks.  If it hurts, it should be elevated.  Follow Up Call my office at [phone] when you are discharged from the hospital or surgery center to schedule an appointment to be seen two weeks after surgery.  Call my office at [phone] if you develop a fever >101.5 F, nausea, vomiting, bleeding from the surgical site or severe pain.            Regional Anesthesia Blocks  1.  Numbness or the inability to move the "blocked" extremity may last from 3-48 hours after placement. The length of time depends on the medication injected and your individual response to the medication. If the numbness is not going away after 48 hours, call your surgeon.  2. The extremity that is blocked will need to be protected until the numbness is gone and the  Strength has returned. Because you cannot feel it, you will need to take extra care to avoid injury. Because it may be weak, you may have difficulty moving it or using it. You may not know what position it is in without looking at it while the block is in effect.  3. For blocks in the legs and feet, returning to weight bearing and walking needs to be done carefully. You will need to wait until the numbness is entirely gone and the strength has returned. You should be able to move your leg and foot normally before you try and bear weight or walk. You will need someone to be with you when you first try to ensure you do not fall and possibly risk injury.  4. Bruising and tenderness at the needle site are common side effects and will resolve in a few days.  5. Persistent numbness or new problems with movement should be  communicated to the surgeon or the [place] [phone] [place] [phone]).        Post Anesthesia Home Care Instructions  Activity: Get plenty of rest for the remainder of the day. A responsible individual must stay with you for 24 hours following the procedure.  For the next 24 hours, DO NOT: -Drive a car -Paediatric nurse -Drink alcoholic beverages -Take any medication unless instructed by your physician -Make any legal decisions or sign important papers.  Meals: Start with liquid foods such as gelatin or soup. Progress to regular foods as tolerated. Avoid greasy, spicy, heavy foods. If nausea and/or vomiting occur, drink only clear liquids until the nausea and/or vomiting  subsides. Call your physician if vomiting continues.  Special Instructions/Symptoms: Your throat may feel dry or sore from the anesthesia or the breathing tube placed in your throat during surgery. If this causes discomfort, gargle with warm salt water. The discomfort should disappear within 24 hours.  If you had a scopolamine patch placed behind your ear for the management of post- operative nausea and/or vomiting:  1. The medication in the patch is effective for 72 hours, after which it should be removed.  Wrap patch in a tissue and discard in the trash. Wash hands thoroughly with soap and water. 2. You may remove the patch earlier than 72 hours if you experience unpleasant side effects which may include dry mouth, dizziness or visual disturbances. 3. Avoid touching the patch. Wash your hands with soap and water after contact with the patch.

## 2018-01-28 NOTE — Transfer of Care (Signed)
Immediate Anesthesia Transfer of Care Note  Patient: Stephen Medina  Procedure(s) Performed: Exostectomy left first metatarsal base and medial cuneiform (Left Foot)  Patient Location: PACU  Anesthesia Type:General and Regional  Level of Consciousness: awake and sedated  Airway & Oxygen Therapy: Patient Spontanous Breathing and Patient connected to face mask oxygen  Post-op Assessment: Report given to RN and Post -op Vital signs reviewed and stable  Post vital signs: Reviewed and stable  Last Vitals:  Vitals Value Taken Time  BP 104/66 01/28/2018  3:01 PM  Temp    Pulse 60 01/28/2018  3:03 PM  Resp 15 01/28/2018  3:03 PM  SpO2 100 % 01/28/2018  3:03 PM  Vitals shown include unvalidated device data.  Last Pain:  Vitals:   01/28/18 1152  TempSrc: Oral  PainSc: 2       Patients Stated Pain Goal: 2 (00/92/33 0076)  Complications: No apparent anesthesia complications

## 2018-01-29 ENCOUNTER — Encounter (HOSPITAL_BASED_OUTPATIENT_CLINIC_OR_DEPARTMENT_OTHER): Payer: Self-pay | Admitting: Orthopedic Surgery

## 2018-02-12 ENCOUNTER — Other Ambulatory Visit: Payer: Self-pay | Admitting: Cardiology

## 2018-02-12 ENCOUNTER — Other Ambulatory Visit: Payer: Self-pay | Admitting: Gastroenterology

## 2018-02-12 MED ORDER — CLOPIDOGREL BISULFATE 75 MG PO TABS: 75.0000 mg | ORAL_TABLET | Freq: Every day | ORAL | 1 refills | Status: DC

## 2018-02-12 MED ORDER — METOPROLOL SUCCINATE ER 25 MG PO TB24: 25.0000 mg | ORAL_TABLET | Freq: Every day | ORAL | 1 refills | Status: DC

## 2018-02-12 MED ORDER — ISOSORBIDE MONONITRATE ER 30 MG PO TB24: 30.0000 mg | ORAL_TABLET | Freq: Every day | ORAL | 1 refills | Status: DC

## 2018-02-12 NOTE — Telephone Encounter (Signed)
Refills provided 

## 2018-02-12 NOTE — Telephone Encounter (Signed)
°  1. Which medications need to be refilled? (please list name of each medication and dose if known) Metoprolol succinate ER 26mg  tablets  Once daily; Isosrbide mononitrate ER 30mg  once daily; Plavix 75mg  once daily  2. Which pharmacy/location (including street and city if local pharmacy) is medication to be sent to? [facility]  3. Do they need a 30 day or 90 day supply? [place]

## 2018-03-13 ENCOUNTER — Other Ambulatory Visit: Payer: Self-pay | Admitting: Gastroenterology

## 2018-04-12 ENCOUNTER — Other Ambulatory Visit: Payer: Self-pay | Admitting: Gastroenterology

## 2018-04-16 ENCOUNTER — Telehealth: Payer: Self-pay | Admitting: Cardiology

## 2018-04-16 MED ORDER — ROSUVASTATIN CALCIUM 40 MG PO TABS: 20.0000 mg | ORAL_TABLET | Freq: Every day | ORAL | 1 refills | Status: DC

## 2018-04-16 NOTE — Telephone Encounter (Signed)
°*  STAT* If patient is at the pharmacy, call can be transferred to refill team.   1. Which medications need to be refilled? (please list name of each medication and dose if known) Rosuvastatin 40mg  tab  2. Which pharmacy/location (including street and city if local pharmacy) is medication to be sent to?[facility]  3. Do they need a 30 day or 90 day supply? [place]

## 2018-05-11 ENCOUNTER — Other Ambulatory Visit: Payer: Self-pay | Admitting: Gastroenterology

## 2018-05-11 ENCOUNTER — Other Ambulatory Visit: Payer: Self-pay | Admitting: Cardiology

## 2018-05-11 ENCOUNTER — Telehealth: Payer: Self-pay

## 2018-05-11 NOTE — Telephone Encounter (Signed)
Called patient to schedule 6 mo f/u. Patient agreed to televisit, sent consent for approval via mychart. Patient instructed to have BP cuff, scale and all meds available for reconciliation.

## 2018-05-25 ENCOUNTER — Encounter: Payer: Self-pay | Admitting: Cardiology

## 2018-05-25 ENCOUNTER — Other Ambulatory Visit: Payer: Self-pay

## 2018-05-25 ENCOUNTER — Telehealth (INDEPENDENT_AMBULATORY_CARE_PROVIDER_SITE_OTHER): Payer: Medicare Other | Admitting: Cardiology

## 2018-05-25 VITALS — BP 125/76 | HR 64 | Ht 71.0 in | Wt 320.0 lb

## 2018-05-25 DIAGNOSIS — I2510 Atherosclerotic heart disease of native coronary artery without angina pectoris: Secondary | ICD-10-CM

## 2018-05-25 DIAGNOSIS — E782 Mixed hyperlipidemia: Secondary | ICD-10-CM

## 2018-05-25 DIAGNOSIS — I10 Essential (primary) hypertension: Secondary | ICD-10-CM

## 2018-05-25 MED ORDER — ASPIRIN EC 81 MG PO TBEC: 81.0000 mg | DELAYED_RELEASE_TABLET | Freq: Every day | ORAL | 3 refills | Status: DC

## 2018-05-25 MED ORDER — NITROGLYCERIN 0.4 MG SL SUBL: 0.4000 mg | SUBLINGUAL_TABLET | SUBLINGUAL | 11 refills | Status: DC | PRN

## 2018-05-25 NOTE — Progress Notes (Signed)
Virtual Visit via Video Note   This visit type was conducted due to national recommendations for restrictions regarding the COVID-19 Pandemic (e.g. social distancing) in an effort to limit this patient's exposure and mitigate transmission in our community.  Due to his co-morbid illnesses, this patient is at least at moderate risk for complications without adequate follow up.  This format is felt to be most appropriate for this patient at this time.  All issues noted in this document were discussed and addressed.  A limited physical exam was performed with this format.  Please refer to the patient's chart for his consent to telehealth for [facility].   Date:  05/25/2018   ID:  Stephen Medina, DOB [birth year], MRN [id number]  Patient Location: Home Provider Location: Home  PCP:  Sharilyn Sites, MD  Cardiologist:  No primary care provider on file.  Electrophysiologist:  None   Evaluation Performed:  Follow-Up Visit  Chief Complaint: Coronary artery disease  History of Present Illness:    Stephen Medina is a 68 y.o. male with past medical history of essential hypertension, coronary artery disease, mixed dyslipidemia and morbid obesity.  Patient mentions to me that he leads an active lifestyle.  He mentions to me that when he changes posture like lying down to sitting up or standing up suddenly he feels dizzy and lightheaded.  No syncope no palpitations.  He has been having this issue for the past several weeks.  At the time of my evaluation, the patient is alert awake oriented and in no distress.  The patient does not have symptoms concerning for COVID-19 infection (fever, chills, cough, or new shortness of breath).    Past Medical History:  Diagnosis Date  . CAD (coronary artery disease) 08/01/2013   Cath 2005  normal Left main, scattered irregularities LAD, 80% stenosis ostial Diag 1, scattered irregularities CFX, scattered irregularities RCA  Taxus stent March 2005 Diagonal   . Disc  displacement, lumbar   . Foot ulceration ([place])    left  . GERD (gastroesophageal reflux disease)   . History of ulcer disease 10/19/2013  . Hyperlipidemia   . Hypertension   . Lumbar spondylosis 04/01/2013  . Morbid obesity ([place])   . Wears glasses    Past Surgical History:  Procedure Laterality Date  . APPENDECTOMY    . BACK SURGERY    . BONE EXOSTOSIS EXCISION Left 01/28/2018   Procedure: Exostectomy left first metatarsal base and medial cuneiform;  Surgeon: Wylene Simmer, MD;  Location: [place];  Service: Orthopedics;  Laterality: Left;  51min  . CARDIAC CATHETERIZATION     05    1 stent   dr Wynonia Lawman  . ESOPHAGOGASTRODUODENOSCOPY N/A 08/02/2013   Procedure: ESOPHAGOGASTRODUODENOSCOPY (EGD);  Surgeon: Ladene Artist, MD;  [facility];  Service: Endoscopy;  Laterality: N/A;  . FOOT ARTHRODESIS Left 05/22/2016   Procedure: Left Subtalar and Talonavicular Joint Arthrodesis;  Surgeon: Wylene Simmer, MD;  Location: [place];  Service: Orthopedics;  Laterality: Left;  . FOOT SURGERY    . GASTROCNEMIUS RECESSION Left 05/22/2016   Procedure: Left Gastroc Recession;  Surgeon: Wylene Simmer, MD;  Location: [place];  Service: Orthopedics;  Laterality: Left;  Marland Kitchen GRAFT APPLICATION N/A 0/09/3233   Procedure: INTEGRA GRAFT APPLICATION;  Surgeon: Trula Slade, DPM;  Location: [place];  Service: Podiatry;  Laterality: N/A;  . LEFT HEART CATHETERIZATION WITH CORONARY ANGIOGRAM N/A 02/09/2014   Procedure: LEFT HEART CATHETERIZATION WITH  CORONARY ANGIOGRAM;  Surgeon: Peter M Martinique, MD;  Location: [facility] CATH LAB;  Service: Cardiovascular;  Laterality: N/A;  . LUMBAR LAMINECTOMY     x2  . LUMBAR LAMINECTOMY/DECOMPRESSION MICRODISCECTOMY Left 09/03/2015   Procedure: MICRODISCECTOMY L5-S1 LEFT;  Surgeon: Ashok Pall, MD;  Location: [place] NEURO ORS;  Service: Neurosurgery;  Laterality: Left;   MICRODISCECTOMY L5-S1 LEFT  . LUMBAR  LAMINECTOMY/DECOMPRESSION MICRODISCECTOMY Left 02/13/2016   Procedure: MICRODISCECTOMY LUMBAR FIVE- SACRAL ONE LEFT;  Surgeon: Ashok Pall, MD;  Location: [place];  Service: Neurosurgery;  Laterality: Left;  . TONSILLECTOMY    . WOUND DEBRIDEMENT N/A 06/22/2015   Procedure: DEBRIDEMENT WOUND;  Surgeon: Trula Slade, DPM;  Location: [place];  Service: Podiatry;  Laterality: N/A;     Current Meds  Medication Sig  . aspirin 325 MG tablet Take 325 mg by mouth daily.   . clopidogrel (PLAVIX) 75 MG tablet Take 1 tablet (75 mg total) by mouth daily.  Marland Kitchen docusate sodium (COLACE) 100 MG capsule Take 1 capsule (100 mg total) by mouth 2 (two) times daily. While taking narcotic pain medicine. (Patient taking differently: Take 100 mg by mouth daily. While taking narcotic pain medicine.)  . ezetimibe (ZETIA) 10 MG tablet TAKE 1 TABLET BY MOUTH DAILY  . isosorbide mononitrate (IMDUR) 30 MG 24 hr tablet Take 1 tablet (30 mg total) by mouth daily.  . meloxicam (MOBIC) 15 MG tablet Take 1 tablet by mouth daily.  . metoprolol succinate (TOPROL-XL) 25 MG 24 hr tablet Take 1 tablet (25 mg total) by mouth daily.  . pantoprazole (PROTONIX) 40 MG tablet TAKE 1 TABLET BY MOUTH DAILY  . rosuvastatin (CRESTOR) 40 MG tablet Take 0.5 tablets (20 mg total) by mouth daily.     Allergies:   Adhesive [tape]   Social History   Tobacco Use  . Smoking status: Never Smoker  . Smokeless tobacco: Former Systems developer    Types: Chew  Substance Use Topics  . Alcohol use: No    Alcohol/week: 1.0 standard drinks    Types: 1 Glasses of wine per week  . Drug use: No     Family Hx: The patient's family history includes CAD in his cousin; Cancer in his brother; Coronary artery disease in his father. There is no history of Colon cancer, Pancreatic cancer, Rectal cancer, or Stomach cancer.  ROS:   Please see the history of present illness.    As mentioned above All other systems reviewed and are negative.    Prior CV studies:   The following studies were reviewed today:  None  Labs/Other Tests and Data Reviewed:    EKG:  No ECG reviewed.  Recent Labs: 11/30/2017: ALT 14; BUN 11; Creatinine, Ser 0.89; Hemoglobin 14.4; Platelets 285; Potassium 4.2; Sodium 136; TSH 1.750   Recent Lipid Panel Lab Results  Component Value Date/Time   CHOL 179 11/30/2017 02:20 PM   TRIG 121 11/30/2017 02:20 PM   HDL 52 11/30/2017 02:20 PM   CHOLHDL 3.4 11/30/2017 02:20 PM   LDLCALC 103 (H) 11/30/2017 02:20 PM    Wt Readings from Last 3 Encounters:  05/25/18 (!) 320 lb (145.2 kg)  01/28/18 (!) 307 lb 12.2 oz (139.6 kg)  12/21/17 (!) 317 lb (143.8 kg)     Objective:    Vital Signs:  BP 125/76 (BP Location: Left Arm, Patient Position: Sitting, Cuff Size: Normal)   Pulse 64   Ht 5\' 11"  (1.803 m)   Wt (!) 320 lb (145.2 kg)  BMI 44.63 kg/m    VITAL SIGNS:  reviewed  ASSESSMENT & PLAN:    1. Coronary artery disease: Secondary prevention stressed with the patient.  Importance of compliance with diet and medication stressed and he vocalized understanding.  Diet was discussed for dyslipidemia and obesity and risks of obesity explained and he vocalized understanding and he plans to do better.  Importance of regular exercise stressed. 2. Essential hypertension: I think his blood pressure is borderline and he has symptoms suggestive of postural hypotension and therefore I have asked him to stop his isosorbide and keep a track of his blood pressures.  If he does not feel better in a couple of days he will get back to [place] and we will further adjust his medications. 3. I told him to cut down his aspirin to coated 81 mg baby aspirin daily.  Also he needs a refill for nitroglycerin and we will do this. 4. Patient will be seen in follow-up appointment in 3 months or earlier if the patient has any concerns   COVID-19 Education: The signs and symptoms of COVID-19 were discussed with the patient and how to seek care  for testing (follow up with PCP or arrange E-visit).  The importance of social distancing was discussed today.  Time:   Today, I have spent 15 minutes with the patient with telehealth technology discussing the above problems.     Medication Adjustments/Labs and Tests Ordered: Current medicines are reviewed at length with the patient today.  Concerns regarding medicines are outlined above.   Tests Ordered: No orders of the defined types were placed in this encounter.   Medication Changes: No orders of the defined types were placed in this encounter.   Disposition:  Follow up in 3 month(s)  Signed, Jenean Lindau, MD  05/25/2018 10:35 AM    [place]

## 2018-05-25 NOTE — Addendum Note (Signed)
Addended by: Particia Nearing B on: 05/25/2018 11:03 AM   Modules accepted: Orders

## 2018-05-25 NOTE — Patient Instructions (Signed)
Medication Instructions:  Your physician has recommended you make the following change in your medication:  STOP: Isosorbide (IMDUR) 30mg  DECREASE: Aspirin to 81mg  daily  If you need a refill on your cardiac medications before your next appointment, please call your pharmacy.   Lab work: None If you have labs (blood work) drawn today and your tests are completely normal, you will receive your results only by: Marland Kitchen MyChart Message (if you have MyChart) OR . A paper copy in the mail If you have any lab test that is abnormal or we need to change your treatment, we will call you to review the results.  Testing/Procedures: None  Follow-Up: At [facility], you and your health needs are our priority.  As part of our continuing mission to provide you with exceptional heart care, we have created designated Provider Care Teams.  These Care Teams include your primary Cardiologist (physician) and Advanced Practice Providers (APPs -  Physician Assistants and Nurse Practitioners) who all work together to provide you with the care you need, when you need it. You will need a follow up appointment in 3 months.  Any Other Special Instructions Will Be Listed Below (If Applicable).   PLEASE CHECK BLOOD PRESSURE DAILY AT THE SAME TIME. IF NOT FEELING BETTER IN A COUPLE OF DAYS CALL THE OFFICE FOR FURTHER INSTRUCTIONS.

## 2018-06-13 ENCOUNTER — Other Ambulatory Visit: Payer: Self-pay | Admitting: Gastroenterology

## 2018-06-30 DIAGNOSIS — M19079 Primary osteoarthritis, unspecified ankle and foot: Secondary | ICD-10-CM

## 2018-06-30 HISTORY — DX: Primary osteoarthritis, unspecified ankle and foot: M19.079

## 2018-07-22 ENCOUNTER — Other Ambulatory Visit: Payer: Self-pay | Admitting: Cardiology

## 2018-08-18 ENCOUNTER — Other Ambulatory Visit: Payer: Self-pay

## 2018-08-18 ENCOUNTER — Ambulatory Visit (INDEPENDENT_AMBULATORY_CARE_PROVIDER_SITE_OTHER): Payer: Medicare Other | Admitting: Cardiology

## 2018-08-18 ENCOUNTER — Encounter: Payer: Self-pay | Admitting: Cardiology

## 2018-08-18 VITALS — BP 106/72 | HR 76 | Ht 71.0 in | Wt 307.0 lb

## 2018-08-18 DIAGNOSIS — I10 Essential (primary) hypertension: Secondary | ICD-10-CM | POA: Diagnosis not present

## 2018-08-18 DIAGNOSIS — E782 Mixed hyperlipidemia: Secondary | ICD-10-CM

## 2018-08-18 DIAGNOSIS — I2510 Atherosclerotic heart disease of native coronary artery without angina pectoris: Secondary | ICD-10-CM

## 2018-08-18 NOTE — Patient Instructions (Signed)

## 2018-08-18 NOTE — Progress Notes (Signed)
Cardiology Office Note:    Date:  08/18/2018   ID:  KAHIAU SCHEWE, DOB 05/30/1950, MRN [id number]  PCP:  Sharilyn Sites, MD  Cardiologist:  Jenean Lindau, MD   Referring MD: Sharilyn Sites, MD    ASSESSMENT:    1. Coronary artery disease involving native coronary artery of native heart without angina pectoris   2. Essential hypertension   3. Mixed hyperlipidemia    PLAN:    In order of problems listed above:  1. Coronary artery disease: Secondary prevention stressed with the patient.  Importance of compliance with diet and medication stressed and he vocalized understanding. 2. Essential hypertension: Blood pressure stable 3. Mixed dyslipidemia: Lipids followed by primary care physician.  Diet was discussed for this and obesity and risks of obesity explained and he vocalized understanding. 4. Patient will be seen in follow-up appointment in 6 months or earlier if the patient has any concerns    Medication Adjustments/Labs and Tests Ordered: Current medicines are reviewed at length with the patient today.  Concerns regarding medicines are outlined above.  No orders of the defined types were placed in this encounter.  No orders of the defined types were placed in this encounter.    No chief complaint on file.    History of Present Illness:    Stephen Medina is a 68 y.o. male.  Patient has past medical history of coronary artery disease, essential hypertension, mixed dyslipidemia and morbid obesity.  He leads a sedentary lifestyle because of orthopedic issues.  He denies any chest pain orthopnea or PND.  He takes care of activities of daily living.  He is full-time Cabin crew by profession.  At the time of my evaluation, the patient is alert awake oriented and in no distress.  Past Medical History:  Diagnosis Date  . CAD (coronary artery disease) 08/01/2013   Cath 2005  normal Left main, scattered irregularities LAD, 80% stenosis ostial Diag 1, scattered irregularities CFX,  scattered irregularities RCA  Taxus stent March 2005 Diagonal   . Disc displacement, lumbar   . Foot ulceration ([place])    left  . GERD (gastroesophageal reflux disease)   . History of ulcer disease 10/19/2013  . Hyperlipidemia   . Hypertension   . Lumbar spondylosis 04/01/2013  . Morbid obesity ([place])   . Wears glasses     Past Surgical History:  Procedure Laterality Date  . APPENDECTOMY    . BACK SURGERY    . BONE EXOSTOSIS EXCISION Left 01/28/2018   Procedure: Exostectomy left first metatarsal base and medial cuneiform;  Surgeon: Wylene Simmer, MD;  Location: [place];  Service: Orthopedics;  Laterality: Left;  55min  . CARDIAC CATHETERIZATION     05    1 stent   dr Wynonia Lawman  . ESOPHAGOGASTRODUODENOSCOPY N/A 08/02/2013   Procedure: ESOPHAGOGASTRODUODENOSCOPY (EGD);  Surgeon: Ladene Artist, MD;  [facility] ENDOSCOPY;  Service: Endoscopy;  Laterality: N/A;  . FOOT ARTHRODESIS Left 05/22/2016   Procedure: Left Subtalar and Talonavicular Joint Arthrodesis;  Surgeon: Wylene Simmer, MD;  Location: [place];  Service: Orthopedics;  Laterality: Left;  . FOOT SURGERY    . GASTROCNEMIUS RECESSION Left 05/22/2016   Procedure: Left Gastroc Recession;  Surgeon: Wylene Simmer, MD;  Location: [place];  Service: Orthopedics;  Laterality: Left;  Marland Kitchen GRAFT APPLICATION N/A 0/09/3233   Procedure: INTEGRA GRAFT APPLICATION;  Surgeon: Trula Slade, DPM;  Location: [place];  Service: Podiatry;  Laterality: N/A;  .  LEFT HEART CATHETERIZATION WITH CORONARY ANGIOGRAM N/A 02/09/2014   Procedure: LEFT HEART CATHETERIZATION WITH CORONARY ANGIOGRAM;  Surgeon: Peter M Martinique, MD;  Location: [facility] CATH LAB;  Service: Cardiovascular;  Laterality: N/A;  . LUMBAR LAMINECTOMY     x2  . LUMBAR LAMINECTOMY/DECOMPRESSION MICRODISCECTOMY Left 09/03/2015   Procedure: MICRODISCECTOMY L5-S1 LEFT;  Surgeon: Ashok Pall, MD;  Location: [place] NEURO ORS;  Service:  Neurosurgery;  Laterality: Left;   MICRODISCECTOMY L5-S1 LEFT  . LUMBAR LAMINECTOMY/DECOMPRESSION MICRODISCECTOMY Left 02/13/2016   Procedure: MICRODISCECTOMY LUMBAR FIVE- SACRAL ONE LEFT;  Surgeon: Ashok Pall, MD;  Location: [place];  Service: Neurosurgery;  Laterality: Left;  . TONSILLECTOMY    . WOUND DEBRIDEMENT N/A 06/22/2015   Procedure: DEBRIDEMENT WOUND;  Surgeon: Trula Slade, DPM;  Location: [place];  Service: Podiatry;  Laterality: N/A;    Current Medications: Current Meds  Medication Sig  . aspirin 325 MG EC tablet Take 162.5 mg by mouth daily. Pt takes 1/2 tablet  . clopidogrel (PLAVIX) 75 MG tablet Take 1 tablet (75 mg total) by mouth daily.  Marland Kitchen docusate sodium (COLACE) 100 MG capsule Take 1 capsule (100 mg total) by mouth 2 (two) times daily. While taking narcotic pain medicine.  . ezetimibe (ZETIA) 10 MG tablet TAKE 1 TABLET BY MOUTH DAILY  . meloxicam (MOBIC) 15 MG tablet Take 1 tablet by mouth daily.  . metoprolol succinate (TOPROL-XL) 25 MG 24 hr tablet Take 1 tablet (25 mg total) by mouth daily.  . nitroGLYCERIN (NITROSTAT) 0.4 MG SL tablet Place 1 tablet (0.4 mg total) under the tongue every 5 (five) minutes as needed.  . pantoprazole (PROTONIX) 40 MG tablet Take 1 tablet (40 mg total) by mouth daily.  . rosuvastatin (CRESTOR) 40 MG tablet Take 0.5 tablets (20 mg total) by mouth daily.     Allergies:   Adhesive [tape]   Social History   Socioeconomic History  . Marital status: Married    Spouse name: Not on file  . Number of children: Not on file  . Years of education: Not on file  . Highest education level: Not on file  Occupational History  . Not on file  Social Needs  . Financial resource strain: Not on file  . Food insecurity    Worry: Not on file    Inability: Not on file  . Transportation needs    Medical: Not on file    Non-medical: Not on file  Tobacco Use  . Smoking status: Never Smoker  . Smokeless tobacco: Former Systems developer     Types: Chew  Substance and Sexual Activity  . Alcohol use: No    Alcohol/week: 1.0 standard drinks    Types: 1 Glasses of wine per week  . Drug use: No  . Sexual activity: Not on file  Lifestyle  . Physical activity    Days per week: Not on file    Minutes per session: Not on file  . Stress: Not on file  Relationships  . Social Herbalist on phone: Not on file    Gets together: Not on file    Attends religious service: Not on file    Active member of club or organization: Not on file    Attends meetings of clubs or organizations: Not on file    Relationship status: Not on file  Other Topics Concern  . Not on file  Social History Narrative   Pt lives in [place] with his wife. They have one son  who does not live in the area. Pt previously worked for [employer] but is retired. He used to [employer]. He has never smoke and drinks 4-5 alcoholic beverages per week. No illicit drug use.     Family History: The patient's family history includes CAD in his cousin; Cancer in his brother; Coronary artery disease in his father. There is no history of Colon cancer, Pancreatic cancer, Rectal cancer, or Stomach cancer.  ROS:   Please see the history of present illness.    All other systems reviewed and are negative.  EKGs/Labs/Other Studies Reviewed:    The following studies were reviewed today: EKG reveals sinus rhythm and nonspecific ST-T changes and poor anterior forces   Recent Labs: 11/30/2017: ALT 14; BUN 11; Creatinine, Ser 0.89; Hemoglobin 14.4; Platelets 285; Potassium 4.2; Sodium 136; TSH 1.750  Recent Lipid Panel    Component Value Date/Time   CHOL 179 11/30/2017 1420   TRIG 121 11/30/2017 1420   HDL 52 11/30/2017 1420   CHOLHDL 3.4 11/30/2017 1420   LDLCALC 103 (H) 11/30/2017 1420    Physical Exam:    VS:  BP 106/72   Pulse 76   Ht 5\' 11"  (1.803 m)   Wt (!) 307 lb (139.3 kg)   SpO2 98%   BMI 42.82 kg/m     Wt Readings from Last 3  Encounters:  08/18/18 (!) 307 lb (139.3 kg)  05/25/18 (!) 320 lb (145.2 kg)  01/28/18 (!) 307 lb 12.2 oz (139.6 kg)     GEN: Patient is in no acute distress HEENT: Normal NECK: No JVD; No carotid bruits LYMPHATICS: No lymphadenopathy CARDIAC: Hear sounds regular, 2/6 systolic murmur at the apex. RESPIRATORY:  Clear to auscultation without rales, wheezing or rhonchi  ABDOMEN: Soft, non-tender, non-distended MUSCULOSKELETAL:  No edema; No deformity  SKIN: Warm and dry NEUROLOGIC:  Alert and oriented x 3 PSYCHIATRIC:  Normal affect   Signed, Jenean Lindau, MD  08/18/2018 4:23 PM    [place]

## 2018-08-24 ENCOUNTER — Ambulatory Visit: Payer: Medicare Other | Admitting: Cardiology

## 2018-09-15 ENCOUNTER — Other Ambulatory Visit: Payer: Self-pay | Admitting: Cardiology

## 2018-09-27 ENCOUNTER — Other Ambulatory Visit: Payer: Self-pay

## 2018-09-27 MED ORDER — ROSUVASTATIN CALCIUM 40 MG PO TABS: 20.0000 mg | ORAL_TABLET | Freq: Every day | ORAL | 1 refills | Status: DC

## 2018-10-20 DIAGNOSIS — M19079 Primary osteoarthritis, unspecified ankle and foot: Secondary | ICD-10-CM

## 2018-10-20 HISTORY — DX: Primary osteoarthritis, unspecified ankle and foot: M19.079

## 2018-12-24 ENCOUNTER — Encounter (HOSPITAL_COMMUNITY): Payer: Self-pay | Admitting: Emergency Medicine

## 2018-12-24 ENCOUNTER — Other Ambulatory Visit: Payer: Self-pay

## 2018-12-24 ENCOUNTER — Emergency Department (HOSPITAL_COMMUNITY)
Admission: EM | Admit: 2018-12-24 | Discharge: 2018-12-24 | Disposition: A | Discharge status: Home / Self Care | Payer: Medicare Other | Attending: Emergency Medicine | Admitting: Emergency Medicine

## 2018-12-24 DIAGNOSIS — I2510 Atherosclerotic heart disease of native coronary artery without angina pectoris: Secondary | ICD-10-CM | POA: Diagnosis not present

## 2018-12-24 DIAGNOSIS — Z7982 Long term (current) use of aspirin: Secondary | ICD-10-CM | POA: Diagnosis not present

## 2018-12-24 DIAGNOSIS — Z87891 Personal history of nicotine dependence: Secondary | ICD-10-CM | POA: Insufficient documentation

## 2018-12-24 DIAGNOSIS — N3001 Acute cystitis with hematuria: Secondary | ICD-10-CM

## 2018-12-24 DIAGNOSIS — Z79899 Other long term (current) drug therapy: Secondary | ICD-10-CM | POA: Insufficient documentation

## 2018-12-24 DIAGNOSIS — I10 Essential (primary) hypertension: Secondary | ICD-10-CM | POA: Insufficient documentation

## 2018-12-24 DIAGNOSIS — R300 Dysuria: Secondary | ICD-10-CM | POA: Diagnosis present

## 2018-12-24 LAB — URINALYSIS, ROUTINE W REFLEX MICROSCOPIC
Bacteria, UA: NONE SEEN
Bilirubin Urine: NEGATIVE
Glucose, UA: NEGATIVE mg/dL
Ketones, ur: 5 mg/dL — AB
Nitrite: NEGATIVE
Protein, ur: 100 mg/dL — AB
RBC / HPF: >50 RBC/hpf — ABNORMAL HIGH (ref 0–5)
Specific Gravity, Urine: 1.023 (ref 1.005–1.030)
WBC, UA: >50 WBC/hpf — ABNORMAL HIGH (ref 0–5)
pH: 5 (ref 5.0–8.0)

## 2018-12-24 LAB — COMPREHENSIVE METABOLIC PANEL
ALT: 15 U/L (ref 0–44)
AST: 19 U/L (ref 15–41)
Albumin: 3.8 g/dL (ref 3.5–5.0)
Alkaline Phosphatase: 191 U/L — ABNORMAL HIGH (ref 38–126)
Anion gap: 12 (ref 5–15)
BUN: 8 mg/dL (ref 8–23)
CO2: 21 mmol/L — ABNORMAL LOW (ref 22–32)
Calcium: 8.7 mg/dL — ABNORMAL LOW (ref 8.9–10.3)
Chloride: 101 mmol/L (ref 98–111)
Creatinine, Ser: 0.87 mg/dL (ref 0.61–1.24)
GFR calc Af Amer: >60 mL/min (ref >60)
GFR calc non Af Amer: >60 mL/min (ref >60)
Glucose, Bld: 104 mg/dL — ABNORMAL HIGH (ref 70–99)
Potassium: 3.9 mmol/L (ref 3.5–5.1)
Sodium: 134 mmol/L — ABNORMAL LOW (ref 135–145)
Total Bilirubin: 1 mg/dL (ref 0.3–1.2)
Total Protein: 7.8 g/dL (ref 6.5–8.1)

## 2018-12-24 LAB — CBC WITH DIFFERENTIAL/PLATELET
Abs Immature Granulocytes: 0.05 10*3/uL (ref 0.00–0.07)
Basophils Absolute: 0 10*3/uL (ref 0.0–0.1)
Basophils Relative: 0 %
Eosinophils Absolute: 0 10*3/uL (ref 0.0–0.5)
Eosinophils Relative: 0 %
HCT: 39.5 % (ref 39.0–52.0)
Hemoglobin: 12.8 g/dL — ABNORMAL LOW (ref 13.0–17.0)
Immature Granulocytes: 0 %
Lymphocytes Relative: 16 %
Lymphs Abs: 2.3 10*3/uL (ref 0.7–4.0)
MCH: 29.7 pg (ref 26.0–34.0)
MCHC: 32.4 g/dL (ref 30.0–36.0)
MCV: 91.6 fL (ref 80.0–100.0)
Monocytes Absolute: 1.1 10*3/uL — ABNORMAL HIGH (ref 0.1–1.0)
Monocytes Relative: 8 %
Neutro Abs: 10.7 10*3/uL — ABNORMAL HIGH (ref 1.7–7.7)
Neutrophils Relative %: 76 %
Platelets: 310 10*3/uL (ref 150–400)
RBC: 4.31 MIL/uL (ref 4.22–5.81)
RDW: 13.2 % (ref 11.5–15.5)
WBC: 14.2 10*3/uL — ABNORMAL HIGH (ref 4.0–10.5)
nRBC: 0 % (ref 0.0–0.2)

## 2018-12-24 LAB — LACTIC ACID, PLASMA: Lactic Acid, Venous: 1.6 mmol/L (ref 0.5–1.9)

## 2018-12-24 MED ORDER — ACETAMINOPHEN 325 MG PO TABS
650.0000 mg | ORAL_TABLET | Freq: Once | ORAL | Status: AC
  Administered 2018-12-24: 17:00:00 650 mg via ORAL
  Filled 2018-12-24: qty 2

## 2018-12-24 MED ORDER — CEPHALEXIN 500 MG PO CAPS
500.0000 mg | ORAL_CAPSULE | Freq: Once | ORAL | Status: AC
  Administered 2018-12-24: 500 mg via ORAL
  Filled 2018-12-24: qty 1

## 2018-12-24 MED ORDER — CEPHALEXIN 500 MG PO CAPS: 500.0000 mg | ORAL_CAPSULE | Freq: Two times a day (BID) | ORAL | 0 refills | Status: AC

## 2018-12-24 MED ORDER — FLUCONAZOLE 150 MG PO TABS
150.0000 mg | ORAL_TABLET | Freq: Once | ORAL | Status: AC
  Administered 2018-12-24: 17:00:00 150 mg via ORAL
  Filled 2018-12-24: qty 1

## 2018-12-24 NOTE — ED Triage Notes (Signed)
Patient reports urinary frequency, sediment, and painful urination, onset last night.

## 2018-12-24 NOTE — ED Provider Notes (Signed)
[facility] EMERGENCY DEPARTMENT Provider Note   CSN: [id number] Arrival date & time: 12/24/18  1431     History Chief Complaint  Patient presents with  . Dysuria    Stephen Medina is a 68 y.o. male.  HPI   Patient is a 68 year old male with a history of CAD, foot ulcer, GERD, hyperlipidemia, hypertension, morbid obesity, presents emergency department today complaint of dysuria.  Reports he has had dysuria, frequency, urgency and sediment in his urine starting yesterday.  He denies any hematuria, fevers, chills.  He has some suprapubic pain that he describes as "discomfort ".  He denies any severe abdominal pain, nausea, vomiting, diarrhea, constipation, flank pain.  Reports he has had a UTI in the past but is not sure if it feels similar because it was so long ago.  Denies any known history of prostate disease.  Past Medical History:  Diagnosis Date  . CAD (coronary artery disease) 08/01/2013   Cath 2005  normal Left main, scattered irregularities LAD, 80% stenosis ostial Diag 1, scattered irregularities CFX, scattered irregularities RCA  Taxus stent March 2005 Diagonal   . Disc displacement, lumbar   . Foot ulceration ([place])    left  . GERD (gastroesophageal reflux disease)   . History of ulcer disease 10/19/2013  . Hyperlipidemia   . Hypertension   . Lumbar spondylosis 04/01/2013  . Morbid obesity ([place])   . Wears glasses     Patient Active Problem List   Diagnosis Date Noted  . Mixed dyslipidemia 11/27/2017  . Pain in right knee 05/28/2017  . HNP (herniated nucleus pulposus), lumbar 09/03/2015  . Ulcer of toe of left foot ([place]) 04/16/2015  . Nonhealing ulcer of left lower extremity ([place]) 04/16/2015  . Ulcer of left foot ([place]) 12/04/2014  . Essential hypertension 02/02/2014  . Angina decubitus ([place]) 02/02/2014  . Morbid obesity ([place])   . Hyperlipidemia   . History of ulcer disease 10/19/2013  . CAD (coronary artery disease) 08/01/2013  . Lumbar spondylosis 04/01/2013      Past Surgical History:  Procedure Laterality Date  . APPENDECTOMY    . BACK SURGERY    . BONE EXOSTOSIS EXCISION Left 01/28/2018   Procedure: Exostectomy left first metatarsal base and medial cuneiform;  Surgeon: Wylene Simmer, MD;  Location: [place];  Service: Orthopedics;  Laterality: Left;  19min  . CARDIAC CATHETERIZATION     05    1 stent   dr Wynonia Lawman  . ESOPHAGOGASTRODUODENOSCOPY N/A 08/02/2013   Procedure: ESOPHAGOGASTRODUODENOSCOPY (EGD);  Surgeon: Ladene Artist, MD;  Location: [facility] ENDOSCOPY;  Service: Endoscopy;  Laterality: N/A;  . FOOT ARTHRODESIS Left 05/22/2016   Procedure: Left Subtalar and Talonavicular Joint Arthrodesis;  Surgeon: Wylene Simmer, MD;  Location: [place];  Service: Orthopedics;  Laterality: Left;  . FOOT SURGERY    . GASTROCNEMIUS RECESSION Left 05/22/2016   Procedure: Left Gastroc Recession;  Surgeon: Wylene Simmer, MD;  Location: [place];  Service: Orthopedics;  Laterality: Left;  Marland Kitchen GRAFT APPLICATION N/A [id number]   Procedure: INTEGRA GRAFT APPLICATION;  Surgeon: Trula Slade, DPM;  Location: [place];  Service: Podiatry;  Laterality: N/A;  . LEFT HEART CATHETERIZATION WITH CORONARY ANGIOGRAM N/A 02/09/2014   Procedure: LEFT HEART CATHETERIZATION WITH CORONARY ANGIOGRAM;  Surgeon: Peter M Martinique, MD;  Location: [facility] CATH LAB;  Service: Cardiovascular;  Laterality: N/A;  . LUMBAR LAMINECTOMY     x2  . LUMBAR LAMINECTOMY/DECOMPRESSION MICRODISCECTOMY Left 09/03/2015  Procedure: MICRODISCECTOMY L5-S1 LEFT;  Surgeon: Ashok Pall, MD;  Location: [place] NEURO ORS;  Service: Neurosurgery;  Laterality: Left;   MICRODISCECTOMY L5-S1 LEFT  . LUMBAR LAMINECTOMY/DECOMPRESSION MICRODISCECTOMY Left 02/13/2016   Procedure: MICRODISCECTOMY LUMBAR FIVE- SACRAL ONE LEFT;  Surgeon: Ashok Pall, MD;  Location: [place];  Service: Neurosurgery;  Laterality: Left;  . TONSILLECTOMY    . WOUND DEBRIDEMENT N/A 06/22/2015    Procedure: DEBRIDEMENT WOUND;  Surgeon: Trula Slade, DPM;  Location: [place];  Service: Podiatry;  Laterality: N/A;       Family History  Problem Relation Age of Onset  . Coronary artery disease Father        open heart surgery   . Cancer Brother   . CAD Cousin        requiring stent  . Colon cancer Neg Hx   . Pancreatic cancer Neg Hx   . Rectal cancer Neg Hx   . Stomach cancer Neg Hx     Social History   Tobacco Use  . Smoking status: Never Smoker  . Smokeless tobacco: Former Systems developer    Types: Chew  Substance Use Topics  . Alcohol use: No    Alcohol/week: 1.0 standard drinks    Types: 1 Glasses of wine per week  . Drug use: No    Home Medications Prior to Admission medications   Medication Sig Start Date End Date Taking? Authorizing Provider  aspirin 325 MG EC tablet Take 162.5 mg by mouth daily. Pt takes 1/2 tablet    [provider]  cephALEXin (KEFLEX) 500 MG capsule Take 1 capsule (500 mg total) by mouth 2 (two) times daily for 7 days. 12/24/18 12/31/18  Apolonia Ellwood S, PA-C  clopidogrel (PLAVIX) 75 MG tablet TAKE 1 TABLET BY MOUTH EVERY DAY 09/15/18   Revankar, Reita Cliche, MD  docusate sodium (COLACE) 100 MG capsule Take 1 capsule (100 mg total) by mouth 2 (two) times daily. While taking narcotic pain medicine. 05/22/16   Corky Sing, PA-C  ezetimibe (ZETIA) 10 MG tablet TAKE 1 TABLET BY MOUTH DAILY 09/15/18   Revankar, Reita Cliche, MD  meloxicam (MOBIC) 15 MG tablet Take 1 tablet by mouth daily. 05/24/18   [provider]  metoprolol succinate (TOPROL-XL) 25 MG 24 hr tablet TAKE 1 TABLET BY MOUTH EVERY DAY 09/15/18   Revankar, Reita Cliche, MD  nitroGLYCERIN (NITROSTAT) 0.4 MG SL tablet Place 1 tablet (0.4 mg total) under the tongue every 5 (five) minutes as needed. 05/25/18 08/23/18  Revankar, Reita Cliche, MD  pantoprazole (PROTONIX) 40 MG tablet Take 1 tablet (40 mg total) by mouth daily. 07/22/18   Revankar, Reita Cliche, MD  rosuvastatin  (CRESTOR) 40 MG tablet Take 0.5 tablets (20 mg total) by mouth daily. 09/27/18   Revankar, Reita Cliche, MD    Allergies    Adhesive [tape]  Review of Systems   Review of Systems  Constitutional: Negative for chills and fever.  HENT: Negative for ear pain and sore throat.   Eyes: Negative for visual disturbance.  Respiratory: Negative for cough and shortness of breath.   Cardiovascular: Negative for chest pain.  Gastrointestinal: Positive for abdominal pain (subprapubic). Negative for constipation, diarrhea, nausea and vomiting.  Genitourinary: Positive for dysuria, frequency and urgency. Negative for flank pain and hematuria.  Musculoskeletal: Negative for back pain.  Skin: Negative for color change and rash.  Neurological: Negative for headaches.  All other systems reviewed and are negative.   Physical Exam Updated Vital Signs BP  136/85 (BP Location: Right Arm)   Pulse 100   Temp 99.7 F (37.6 C) (Oral)   Resp 18   Ht 5\' 11"  (1.803 m)   Wt (!) 140.6 kg   SpO2 95%   BMI 43.24 kg/m   Physical Exam Vitals and nursing note reviewed.  Constitutional:      Appearance: He is well-developed.  HENT:     Head: Normocephalic and atraumatic.  Eyes:     Conjunctiva/sclera: Conjunctivae normal.  Cardiovascular:     Rate and Rhythm: Normal rate and regular rhythm.     Heart sounds: Normal heart sounds. No murmur.  Pulmonary:     Effort: Pulmonary effort is normal. No respiratory distress.     Breath sounds: Normal breath sounds. No wheezing, rhonchi or rales.  Abdominal:     General: Bowel sounds are normal.     Palpations: Abdomen is soft.     Tenderness: There is no abdominal tenderness. There is no right CVA tenderness, left CVA tenderness, guarding or rebound.  Musculoskeletal:     Cervical back: Neck supple.     Comments: Post op boot to lle  Skin:    General: Skin is warm and dry.  Neurological:     Mental Status: He is alert.     ED Results / Procedures / Treatments    Labs (all labs ordered are listed, but only abnormal results are displayed) Labs Reviewed  URINALYSIS, ROUTINE W REFLEX MICROSCOPIC - Abnormal; Notable for the following components:      Result Value   APPearance CLOUDY (*)    Hgb urine dipstick LARGE (*)    Ketones, ur 5 (*)    Protein, ur 100 (*)    Leukocytes,Ua LARGE (*)    RBC / HPF >50 (*)    WBC, UA >50 (*)    Non Squamous Epithelial 0-5 (*)    All other components within normal limits  CBC WITH DIFFERENTIAL/PLATELET - Abnormal; Notable for the following components:   WBC 14.2 (*)    Hemoglobin 12.8 (*)    Neutro Abs 10.7 (*)    Monocytes Absolute 1.1 (*)    All other components within normal limits  COMPREHENSIVE METABOLIC PANEL - Abnormal; Notable for the following components:   Sodium 134 (*)    CO2 21 (*)    Glucose, Bld 104 (*)    Calcium 8.7 (*)    Alkaline Phosphatase 191 (*)    All other components within normal limits  URINE CULTURE  LACTIC ACID, PLASMA  LACTIC ACID, PLASMA    EKG None  Radiology No results found.  Procedures Procedures (including critical care time)  Medications Ordered in ED Medications  cephALEXin (KEFLEX) capsule 500 mg (500 mg Oral Given 12/24/18 1700)  acetaminophen (TYLENOL) tablet 650 mg (650 mg Oral Given 12/24/18 1700)  fluconazole (DIFLUCAN) tablet 150 mg (150 mg Oral Given 12/24/18 1715)    ED Course  I have reviewed the triage vital signs and the nursing notes.  Pertinent labs & imaging results that were available during my care of the patient were reviewed by me and considered in my medical decision making (see chart for details).    MDM Rules/Calculators/A&P      68 year old male presenting for evaluation of dysuria, frequency, urgency starting yesterday.  No fevers.  No flank pain.  No nausea or vomiting.  On arrival, patient marginally tachycardic with a heart rate of 102.  His temp is 99.7.  Remainder of his vital signs are  unremarkable.  He has no  abdominal or flank tenderness on exam.  He is well-appearing.  Will obtain labs, UA, urine culture and obtain postvoid residual.  CBC with mild leukocytosis and mild anemia CMP normal electrolytes, kidney, and liver function Lactic acid negative, doubt sepsis UA with hematuria, large leukocytes, >50 RBCs, >50WBCs, and no bacteria. Budding yeast present. Culture sent  - pt given a dose of keflex for UTI  - given a dose of fluconazole for yeast infection  Post void residual bladder scan with 40ml in the bladder.   Pt with UTI. Doubt pyelonephritis. Have low suspicion for sepsis, pt nontoxic appearing and in NAD. Lactic acid negative. Will give abx and f/u with urology. Have advised on specific return precautions. He voices understanding and is in agreement with plan. All questions answered, pt stable for d/c.  Case discussed with Dr. Laverta Baltimore, supervising physician who personally evaluated the patient and is in agreement with plan.   Final Clinical Impression(s) / ED Diagnoses Final diagnoses:  Acute cystitis with hematuria    Rx / DC Orders ED Discharge Orders         Ordered    cephALEXin (KEFLEX) 500 MG capsule  2 times daily     12/24/18 1729           Rodney Booze, PA-C 12/24/18 1730    LongWonda Olds, MD 12/25/18 1547

## 2018-12-24 NOTE — Discharge Instructions (Addendum)
You were given a prescription for antibiotics. Please take the antibiotic prescription fully.   You were given information to follow-up with a urology doctor.  Please call the office tomorrow to schedule an appointment for follow-up.  Please monitor your symptoms closely.  If you develop any fevers, severe pain, vomiting, or any new or worsening symptoms then you should return to the emergency department immediately.

## 2018-12-25 LAB — URINE CULTURE: Culture: NO GROWTH

## 2019-02-24 ENCOUNTER — Ambulatory Visit (INDEPENDENT_AMBULATORY_CARE_PROVIDER_SITE_OTHER): Payer: Medicare Other | Admitting: Cardiology

## 2019-02-24 ENCOUNTER — Encounter: Payer: Self-pay | Admitting: Cardiology

## 2019-02-24 ENCOUNTER — Other Ambulatory Visit: Payer: Self-pay

## 2019-02-24 VITALS — BP 138/84 | HR 60 | Temp 97.2°F | Ht 71.0 in | Wt 312.0 lb

## 2019-02-24 DIAGNOSIS — I10 Essential (primary) hypertension: Secondary | ICD-10-CM

## 2019-02-24 DIAGNOSIS — I2510 Atherosclerotic heart disease of native coronary artery without angina pectoris: Secondary | ICD-10-CM

## 2019-02-24 DIAGNOSIS — E782 Mixed hyperlipidemia: Secondary | ICD-10-CM

## 2019-02-24 DIAGNOSIS — Z1329 Encounter for screening for other suspected endocrine disorder: Secondary | ICD-10-CM

## 2019-02-24 NOTE — Progress Notes (Signed)
Cardiology Office Note:    Date:  02/24/2019   ID:  Stephen Medina, DOB [birth year], MRN [id number]  PCP:  Sharilyn Sites, MD  Cardiologist:  Jenean Lindau, MD   Referring MD: Sharilyn Sites, MD    ASSESSMENT:    1. Coronary artery disease involving native coronary artery of native heart without angina pectoris   2. Essential hypertension   3. Mixed hyperlipidemia   4. Morbid obesity ([place])   5. Mixed dyslipidemia    PLAN:    In order of problems listed above:  1. Coronary artery disease: Secondary prevention stressed to the patient.  Importance of compliance with diet and medication stressed and he vocalized understanding.  Diet was discussed for obesity and risk explained weight reduction was stressed and he promises to do better. 2. Essential hypertension: Blood pressure stable 3. Mixed dyslipidemia: I discussed with him about lipid management and patient is going to come back in the next few days for blood work including Chem-7 liver lipid check. 4. Patient will be seen in follow-up appointment in 6 months or earlier if the patient has any concerns   Medication Adjustments/Labs and Tests Ordered: Current medicines are reviewed at length with the patient today.  Concerns regarding medicines are outlined above.  No orders of the defined types were placed in this encounter.  No orders of the defined types were placed in this encounter.    No chief complaint on file.    History of Present Illness:    Stephen Medina is a 69 y.o. male.  Patient has past medical history of coronary artery disease, dyslipidemia and morbid obesity.  He denies any problems at this time and takes care of activities of daily living.  No chest pain orthopnea or PND.  At the time of my evaluation, the patient is alert awake oriented and in no distress.  He has orthopedic issues involving the right lower extremity for which she does not ambulate much.  It does not seem that he is very compliant with  advice about diet.  Past Medical History:  Diagnosis Date  . CAD (coronary artery disease) 08/01/2013   Cath 2005  normal Left main, scattered irregularities LAD, 80% stenosis ostial Diag 1, scattered irregularities CFX, scattered irregularities RCA  Taxus stent March 2005 Diagonal   . Disc displacement, lumbar   . Foot ulceration ([place])    left  . GERD (gastroesophageal reflux disease)   . History of ulcer disease 10/19/2013  . Hyperlipidemia   . Hypertension   . Lumbar spondylosis 04/01/2013  . Morbid obesity ([place])   . Wears glasses     Past Surgical History:  Procedure Laterality Date  . APPENDECTOMY    . BACK SURGERY    . BONE EXOSTOSIS EXCISION Left 01/28/2018   Procedure: Exostectomy left first metatarsal base and medial cuneiform;  Surgeon: Wylene Simmer, MD;  Location: [place];  Service: Orthopedics;  Laterality: Left;  95min  . CARDIAC CATHETERIZATION     05    1 stent   dr Wynonia Lawman  . ESOPHAGOGASTRODUODENOSCOPY N/A 08/02/2013   Procedure: ESOPHAGOGASTRODUODENOSCOPY (EGD);  Surgeon: Ladene Artist, MD;  Location: [facility] ENDOSCOPY;  Service: Endoscopy;  Laterality: N/A;  . FOOT ARTHRODESIS Left 05/22/2016   Procedure: Left Subtalar and Talonavicular Joint Arthrodesis;  Surgeon: Wylene Simmer, MD;  Location: [place];  Service: Orthopedics;  Laterality: Left;  . FOOT SURGERY    . GASTROCNEMIUS RECESSION Left 05/22/2016   Procedure: Left Gastroc  Recession;  Surgeon: Wylene Simmer, MD;  Location: [place];  Service: Orthopedics;  Laterality: Left;  Marland Kitchen GRAFT APPLICATION N/A [id number]   Procedure: [employer]TE[employer] GRAFT APPLICATION;  Surgeon: Trula Slade, DPM;  Location: [place];  Service: Podiatry;  Laterality: N/A;  . LEFT HEART CATHETERIZATION WITH CORONARY ANGIOGRAM N/A 02/09/2014   Procedure: LEFT HEART CATHETERIZATION WITH CORONARY ANGIOGRAM;  Surgeon: Peter M Martinique, MD;  Location: [facility] CATH LAB;  Service: Cardiovascular;   Laterality: N/A;  . LUMBAR LAMINECTOMY     x2  . LUMBAR LAMINECTOMY/DECOMPRESSION MICRODISCECTOMY Left 09/03/2015   Procedure: MICRODISCECTOMY L5-S1 LEFT;  Surgeon: Ashok Pall, MD;  Location: [place] NEURO ORS;  Service: Neurosurgery;  Laterality: Left;   MICRODISCECTOMY L5-S1 LEFT  . LUMBAR LAMINECTOMY/DECOMPRESSION MICRODISCECTOMY Left 02/13/2016   Procedure: MICRODISCECTOMY LUMBAR FIVE- SACRAL ONE LEFT;  Surgeon: Ashok Pall, MD;  Location: [place];  Service: Neurosurgery;  Laterality: Left;  . TONSILLECTOMY    . WOUND DEBRIDEMENT N/A 06/22/2015   Procedure: DEBRIDEMENT WOUND;  Surgeon: Trula Slade, DPM;  Location: [place];  Service: Podiatry;  Laterality: N/A;    Current Medications: Current Meds  Medication Sig  . aspirin 325 MG EC tablet Take 162.5 mg by mouth daily. Pt takes 1/2 tablet  . clopidogrel (PLAVIX) 75 MG tablet TAKE 1 TABLET BY MOUTH EVERY DAY  . ezetimibe (ZETIA) 10 MG tablet TAKE 1 TABLET BY MOUTH DAILY  . meloxicam (MOBIC) 15 MG tablet Take 1 tablet by mouth daily.  . metoprolol succinate (TOPROL-XL) 25 MG 24 hr tablet TAKE 1 TABLET BY MOUTH EVERY DAY  . pantoprazole (PROTONIX) 40 MG tablet Take 1 tablet (40 mg total) by mouth daily.  . rosuvastatin (CRESTOR) 40 MG tablet Take 0.5 tablets (20 mg total) by mouth daily.     Allergies:   Adhesive [tape] and Other   Social History   Socioeconomic History  . Marital status: Married    Spouse name: Not on file  . Number of children: Not on file  . Years of education: Not on file  . Highest education level: Not on file  Occupational History  . Not on file  Tobacco Use  . Smoking status: Never Smoker  . Smokeless tobacco: Former Systems developer    Types: Chew  Substance and Sexual Activity  . Alcohol use: No    Alcohol/week: 1.0 standard drinks    Types: 1 Glasses of wine per week  . Drug use: No  . Sexual activity: Not on file  Other Topics Concern  . Not on file  Social History Narrative   Pt lives  in [place] with his wife. They have one son who does not live in the area. Pt previously worked for [employer] but is retired. He used to [employer]. He has never smoke and drinks 4-5 alcoholic beverages per week. No illicit drug use.   Social Determinants of Health   Financial Resource Strain:   . Difficulty of Paying Living Expenses: Not on file  Food Insecurity:   . Worried About Charity fundraiser in the Last Year: Not on file  . Ran Out of Food in the Last Year: Not on file  Transportation Needs:   . Lack of Transportation (Medical): Not on file  . Lack of Transportation (Non-Medical): Not on file  Physical Activity:   . Days of Exercise per Week: Not on file  . Minutes of Exercise per Session: Not on file  Stress:   .  Feeling of Stress : Not on file  Social Connections:   . Frequency of Communication with Friends and Family: Not on file  . Frequency of Social Gatherings with Friends and Family: Not on file  . Attends Religious Services: Not on file  . Active Member of Clubs or Organizations: Not on file  . Attends Archivist Meetings: Not on file  . Marital Status: Not on file     Family History: The patient's family history includes CAD in his cousin; Cancer in his brother; Coronary artery disease in his father. There is no history of Colon cancer, Pancreatic cancer, Rectal cancer, or Stomach cancer.  ROS:   Please see the history of present illness.    All other systems reviewed and are negative.  EKGs/Labs/Other Studies Reviewed:    The following studies were reviewed today: I discussed my findings with the patient at extensive length   Recent Labs: 12/24/2018: ALT 15; BUN 8; Creatinine, Ser 0.87; Hemoglobin 12.8; Platelets 310; Potassium 3.9; Sodium 134  Recent Lipid Panel    Component Value Date/Time   CHOL 179 11/30/2017 1420   TRIG 121 11/30/2017 1420   HDL 52 11/30/2017 1420   CHOLHDL 3.4 11/30/2017 1420   LDLCALC 103 (H)  11/30/2017 1420    Physical Exam:    VS:  BP 138/84   Pulse 60   Temp (!) 97.2 F (36.2 C)   Ht 5\' 11"  (1.803 m)   Wt (!) 312 lb (141.5 kg)   SpO2 98%   BMI 43.52 kg/m     Wt Readings from Last 3 Encounters:  02/24/19 (!) 312 lb (141.5 kg)  12/24/18 (!) 310 lb (140.6 kg)  08/18/18 (!) 307 lb (139.3 kg)     GEN: Patient is in no acute distress HEENT: Normal NECK: No JVD; No carotid bruits LYMPHATICS: No lymphadenopathy CARDIAC: Hear sounds regular, 2/6 systolic murmur at the apex. RESPIRATORY:  Clear to auscultation without rales, wheezing or rhonchi  ABDOMEN: Soft, non-tender, non-distended MUSCULOSKELETAL:  No edema; No deformity  SKIN: Warm and dry NEUROLOGIC:  Alert and oriented x 3 PSYCHIATRIC:  Normal affect   Signed, Jenean Lindau, MD  02/24/2019 2:03 PM    [place]

## 2019-02-24 NOTE — Patient Instructions (Signed)
Medication Instructions:  No medication changes *If you need a refill on your cardiac medications before your next appointment, please call your pharmacy*  Lab Work: You need to have labs done when you have been fasting.  You can come back to the office Monday - Friday 8:30-12:00 and 1:15-4:30. You will not need an appt.   If you have labs (blood work) drawn today and your tests are completely normal, you will receive your results only by: Marland Kitchen MyChart Message (if you have MyChart) OR . A paper copy in the mail If you have any lab test that is abnormal or we need to change your treatment, we will call you to review the results.  Testing/Procedures: None ordered  Follow-Up: At [facility], you and your health needs are our priority.  As part of our continuing mission to provide you with exceptional heart care, we have created designated Provider Care Teams.  These Care Teams include your primary Cardiologist (physician) and Advanced Practice Providers (APPs -  Physician Assistants and Nurse Practitioners) who all work together to provide you with the care you need, when you need it.  Your next appointment:   6 month(s)  The format for your next appointment:   In Person  Provider:   Jyl Heinz, MD  Other Instructions NA

## 2019-02-27 ENCOUNTER — Other Ambulatory Visit: Payer: Self-pay | Admitting: Cardiology

## 2019-04-14 DIAGNOSIS — Z96641 Presence of right artificial hip joint: Secondary | ICD-10-CM

## 2019-04-14 HISTORY — DX: Presence of right artificial hip joint: Z96.641

## 2019-05-25 NOTE — Patient Instructions (Addendum)
DUE TO COVID-19 ONLY ONE VISITOR IS ALLOWED TO COME WITH YOU AND STAY IN THE WAITING ROOM ONLY DURING PRE OP AND PROCEDURE DAY OF SURGERY. TWO  VISITOR MAY VISIT WITH YOU AFTER SURGERY IN YOUR PRIVATE ROOM DURING VISITING HOURS ONLY!   10a-8p  YOU NEED TO HAVE A COVID 19 TEST ON__5-22-21_____ @___11 :20 am ____, THIS TEST MUST BE DONE BEFORE SURGERY, COME  [address], [place] , [address].  ([place]) ONCE YOUR COVID TEST IS COMPLETED, PLEASE BEGIN THE QUARANTINE INSTRUCTIONS AS OUTLINED IN YOUR HANDOUT.                Stephen Medina  05/25/2019   Your procedure is scheduled on:   06-08-19   Report to [facility] Main  Entrance   Report to admitting at      0640 AM     Call this number if you have problems the morning of surgery [phone]    Remember: NO SOLID FOOD AFTER MIDNIGHT THE NIGHT PRIOR TO SURGERY. NOTHING BY MOUTH EXCEPT CLEAR LIQUIDS UNTIL    0600 am . PLEASE FINISH ENSURE DRINK PER SURGEON ORDER  WHICH NEEDS TO BE COMPLETED AT      0600 am then nothing by mouth .    CLEAR LIQUID DIET  Until 0600 am then nothing by mouth   Foods Allowed                                                                                       Foods Excluded  Coffee and tea, regular and decaf    NO CREAMER                         liquids that you cannot  Plain Jell-O any favor except red or purple                                           see through such as: Fruit ices (not with fruit pulp)                                                                            milk, soups, orange juice  Iced Popsicles                                                                 All solid food Carbonated beverages, regular and diet  Cranberry, grape and apple juices Sports drinks like Gatorade Lightly seasoned clear broth or consume(fat free) Sugar, honey  syrup   _____________________________________________________________________    BRUSH YOUR TEETH MORNING OF SURGERY AND RINSE YOUR MOUTH OUT, NO CHEWING GUM CANDY OR MINTS.     Take these medicines the morning of surgery with A SIP OF WATER: crestor, protonix, metoprolol, zetia                                 You may not have any metal on your body including hair pins and              piercings  Do not wear jewelry, , lotions, powders or perfumes, deodorant        .              Men may shave face and neck.   Do not bring valuables to the hospital. [place].  Contacts, dentures or bridgework may not be worn into surgery.      Patients discharged the day of surgery will not be allowed to drive home. IF YOU ARE HAVING SURGERY AND GOING HOME THE SAME DAY, YOU MUST HAVE AN ADULT TO DRIVE YOU HOME AND BE WITH YOU FOR 24 HOURS. YOU MAY GO HOME BY TAXI OR UBER OR ORTHERWISE, BUT AN ADULT MUST ACCOMPANY YOU HOME AND STAY WITH YOU FOR 24 HOURS.  Name and phone number of your driver:  Special Instructions: N/A              Please read over the following fact sheets you were given: _____________________________________________________________________             [facility] - Preparing for Surgery Before surgery, you can play an important role.  Because skin is not sterile, your skin needs to be as free of germs as possible.  You can reduce the number of germs on your skin by washing with CHG (chlorahexidine gluconate) soap before surgery.  CHG is an antiseptic cleaner which kills germs and bonds with the skin to continue killing germs even after washing. Please DO NOT use if you have an allergy to CHG or antibacterial soaps.  If your skin becomes reddened/irritated stop using the CHG and inform your nurse when you arrive at Short Stay. Do not shave (including legs and underarms) for at least 48 hours prior to the first CHG shower.  You may  shave your face/neck. Please follow these instructions carefully:  1.  Shower with CHG Soap the night before surgery and the  morning of Surgery.  2.  If you choose to wash your hair, wash your hair first as usual with your  normal  shampoo.  3.  After you shampoo, rinse your hair and body thoroughly to remove the  shampoo.                           4.  Use CHG as you would any other liquid soap.  You can apply chg directly  to the skin and wash                       Gently with a scrungie or clean washcloth.  5.  Apply the CHG Soap to your body ONLY FROM  THE NECK DOWN.   Do not use on face/ open                           Wound or open sores. Avoid contact with eyes, ears mouth and genitals (private parts).                       Wash face,  Genitals (private parts) with your normal soap.             6.  Wash thoroughly, paying special attention to the area where your surgery  will be performed.  7.  Thoroughly rinse your body with warm water from the neck down.  8.  DO NOT shower/wash with your normal soap after using and rinsing off  the CHG Soap.                9.  Pat yourself dry with a clean towel.            10.  Wear clean pajamas.            11.  Place clean sheets on your bed the night of your first shower and do not  sleep with pets. Day of Surgery : Do not apply any lotions/deodorants the morning of surgery.  Please wear clean clothes to the hospital/surgery center.  FAILURE TO FOLLOW THESE INSTRUCTIONS MAY RESULT IN THE CANCELLATION OF YOUR SURGERY PATIENT SIGNATURE_________________________________  NURSE SIGNATURE__________________________________  ________________________________________________________________________   Stephen Medina  An incentive spirometer is a tool that can help keep your lungs clear and active. This tool measures how well you are filling your lungs with each breath. Taking long deep breaths may help reverse or decrease the chance of developing  breathing (pulmonary) problems (especially infection) following:  A long period of time when you are unable to move or be active. BEFORE THE PROCEDURE   If the spirometer includes an indicator to show your best effort, your nurse or respiratory therapist will set it to a desired goal.  If possible, sit up straight or lean slightly forward. Try not to slouch.  Hold the incentive spirometer in an upright position. INSTRUCTIONS FOR USE  1. Sit on the edge of your bed if possible, or sit up as far as you can in bed or on a chair. 2. Hold the incentive spirometer in an upright position. 3. Breathe out normally. 4. Place the mouthpiece in your mouth and seal your lips tightly around it. 5. Breathe in slowly and as deeply as possible, raising the piston or the ball toward the top of the column. 6. Hold your breath for 3-5 seconds or for as long as possible. Allow the piston or ball to fall to the bottom of the column. 7. Remove the mouthpiece from your mouth and breathe out normally. 8. Rest for a few seconds and repeat Steps 1 through 7 at least 10 times every 1-2 hours when you are awake. Take your time and take a few normal breaths between deep breaths. 9. The spirometer may include an indicator to show your best effort. Use the indicator as a goal to work toward during each repetition. 10. After each set of 10 deep breaths, practice coughing to be sure your lungs are clear. If you have an incision (the cut made at the time of surgery), support your incision when coughing by placing a pillow or rolled up towels firmly against it. Once you are  able to get out of bed, walk around indoors and cough well. You may stop using the incentive spirometer when instructed by your caregiver.  RISKS AND COMPLICATIONS  Take your time so you do not get dizzy or light-headed.  If you are in pain, you may need to take or ask for pain medication before doing incentive spirometry. It is harder to take a deep  breath if you are having pain. AFTER USE  Rest and breathe slowly and easily.  It can be helpful to keep track of a log of your progress. Your caregiver can provide you with a simple table to help with this. If you are using the spirometer at home, follow these instructions: [place] IF:   You are having difficultly using the spirometer.  You have trouble using the spirometer as often as instructed.  Your pain medication is not giving enough relief while using the spirometer.  You develop fever of 100.5 F (38.1 C) or higher. SEEK IMMEDIATE MEDICAL CARE IF:   You cough up bloody sputum that had not been present before.  You develop fever of 102 F (38.9 C) or greater.  You develop worsening pain at or near the incision site. MAKE SURE YOU:   Understand these instructions.  Will watch your condition.  Will get help right away if you are not doing well or get worse. Document Released: 05/12/2006 Document Revised: 03/24/2011 Document Reviewed: 07/13/2006 ExitCare Patient Information 2014 ExitCare, [place].   ________________________________________________________________________  WHAT IS A BLOOD TRANSFUSION? Blood Transfusion Information  A transfusion is the replacement of blood or some of its parts. Blood is made up of multiple cells which provide different functions.  Red blood cells carry oxygen and are used for blood loss replacement.  White blood cells fight against infection.  Platelets control bleeding.  Plasma helps clot blood.  Other blood products are available for specialized needs, such as hemophilia or other clotting disorders. BEFORE THE TRANSFUSION  Who gives blood for transfusions?   Healthy volunteers who are fully evaluated to make sure their blood is safe. This is blood bank blood. Transfusion therapy is the safest it has ever been in the practice of medicine. Before blood is taken from a donor, a complete history is taken to make sure  that person has no history of diseases nor engages in risky social behavior (examples are intravenous drug use or sexual activity with multiple partners). The donor's travel history is screened to minimize risk of transmitting infections, such as malaria. The donated blood is tested for signs of infectious diseases, such as HIV and hepatitis. The blood is then tested to be sure it is compatible with you in order to minimize the chance of a transfusion reaction. If you or a relative donates blood, this is often done in anticipation of surgery and is not appropriate for emergency situations. It takes many days to process the donated blood. RISKS AND COMPLICATIONS Although transfusion therapy is very safe and saves many lives, the main dangers of transfusion include:   Getting an infectious disease.  Developing a transfusion reaction. This is an allergic reaction to something in the blood you were given. Every precaution is taken to prevent this. The decision to have a blood transfusion has been considered carefully by your caregiver before blood is given. Blood is not given unless the benefits outweigh the risks. AFTER THE TRANSFUSION  Right after receiving a blood transfusion, you will usually feel much better and more energetic. This is especially  true if your red blood cells have gotten low (anemic). The transfusion raises the level of the red blood cells which carry oxygen, and this usually causes an energy increase.  The nurse administering the transfusion will monitor you carefully for complications. HOME CARE INSTRUCTIONS  No special instructions are needed after a transfusion. You may find your energy is better. Speak with your caregiver about any limitations on activity for underlying diseases you may have. SEEK MEDICAL CARE IF:   Your condition is not improving after your transfusion.  You develop redness or irritation at the intravenous (IV) site. SEEK IMMEDIATE MEDICAL CARE IF:  Any of  the following symptoms occur over the next 12 hours:  Shaking chills.  You have a temperature by mouth above 102 F (38.9 C), not controlled by medicine.  Chest, back, or muscle pain.  People around you feel you are not acting correctly or are confused.  Shortness of breath or difficulty breathing.  Dizziness and fainting.  You get a rash or develop hives.  You have a decrease in urine output.  Your urine turns a dark color or changes to pink, red, or brown. Any of the following symptoms occur over the next 10 days:  You have a temperature by mouth above 102 F (38.9 C), not controlled by medicine.  Shortness of breath.  Weakness after normal activity.  The white part of the eye turns yellow (jaundice).  You have a decrease in the amount of urine or are urinating less often.  Your urine turns a dark color or changes to pink, red, or brown. Document Released: 12/28/1999 Document Revised: 03/24/2011 Document Reviewed: 08/16/2007 [facility] Patient Information 2014 [place], [place].  _______________________________________________________________________

## 2019-05-25 NOTE — Progress Notes (Addendum)
PCP - Sharilyn Sites  Clearance on chart 05-06-19   Cardiologist -[place] 02-24-19 epic Dr. Geraldo Pitter LOV 06-01-19 for clearance  Pending echo and stress test results  Chest x-ray - CTA chest 2019 epic EKG - 08-18-18 epic/ 06-01-19 epic Stress Test - 2019 epic    Stress test 06-02-19 epic ECHO - 12-28-17 epic Cardiac Cath -   Sleep Study -  CPAP -   Fasting Blood Sugar -  Checks Blood Sugar _____ times a day  Blood Thinner Instructions:Plavix  Stopped plavix and asa 5-16 Aspirin Instructions:ASA 325 Last Dose:  Anesthesia review: Cardiac stent x1  36mm ascending aortic dilation  Patient denies shortness of breath, fever, cough and chest pain at PAT appointment  NONE  Patient verbalized understanding of instructions that were given to them at the PAT appointment. Patient was also instructed that they will need to review over the PAT instructions again at home before surgery.

## 2019-05-26 ENCOUNTER — Encounter (HOSPITAL_COMMUNITY)
Admission: RE | Admit: 2019-05-26 | Discharge: 2019-05-26 | Disposition: A | Discharge status: Home / Self Care | Payer: Medicare Other | Source: Ambulatory Visit | Attending: Orthopedic Surgery | Admitting: Orthopedic Surgery

## 2019-05-26 ENCOUNTER — Encounter (HOSPITAL_COMMUNITY): Payer: Self-pay

## 2019-05-26 ENCOUNTER — Other Ambulatory Visit: Payer: Self-pay

## 2019-05-26 ENCOUNTER — Telehealth: Payer: Self-pay | Admitting: *Deleted

## 2019-05-26 DIAGNOSIS — Z01812 Encounter for preprocedural laboratory examination: Secondary | ICD-10-CM | POA: Diagnosis present

## 2019-05-26 HISTORY — DX: Myoneural disorder, unspecified: G70.9

## 2019-05-26 LAB — SURGICAL PCR SCREEN
MRSA, PCR: NEGATIVE
Staphylococcus aureus: NEGATIVE

## 2019-05-26 LAB — PROTIME-INR
INR: 1 (ref 0.8–1.2)
Prothrombin Time: 13 seconds (ref 11.4–15.2)

## 2019-05-26 LAB — CBC
HCT: 43.7 % (ref 39.0–52.0)
Hemoglobin: 14.5 g/dL (ref 13.0–17.0)
MCH: 29.8 pg (ref 26.0–34.0)
MCHC: 33.2 g/dL (ref 30.0–36.0)
MCV: 89.7 fL (ref 80.0–100.0)
Platelets: 242 10*3/uL (ref 150–400)
RBC: 4.87 MIL/uL (ref 4.22–5.81)
RDW: 13.6 % (ref 11.5–15.5)
WBC: 6.6 10*3/uL (ref 4.0–10.5)
nRBC: 0 % (ref 0.0–0.2)

## 2019-05-26 LAB — COMPREHENSIVE METABOLIC PANEL
ALT: 20 U/L (ref 0–44)
AST: 24 U/L (ref 15–41)
Albumin: 4.3 g/dL (ref 3.5–5.0)
Alkaline Phosphatase: 96 U/L (ref 38–126)
Anion gap: 9 (ref 5–15)
BUN: 12 mg/dL (ref 8–23)
CO2: 23 mmol/L (ref 22–32)
Calcium: 9.3 mg/dL (ref 8.9–10.3)
Chloride: 108 mmol/L (ref 98–111)
Creatinine, Ser: 0.89 mg/dL (ref 0.61–1.24)
GFR calc Af Amer: >60 mL/min (ref >60)
GFR calc non Af Amer: >60 mL/min (ref >60)
Glucose, Bld: 100 mg/dL — ABNORMAL HIGH (ref 70–99)
Potassium: 4.2 mmol/L (ref 3.5–5.1)
Sodium: 140 mmol/L (ref 135–145)
Total Bilirubin: 0.8 mg/dL (ref 0.3–1.2)
Total Protein: 7.5 g/dL (ref 6.5–8.1)

## 2019-05-26 LAB — TYPE AND SCREEN
ABO/RH(D): O POS
Antibody Screen: NEGATIVE

## 2019-05-26 LAB — APTT: aPTT: 26 seconds (ref 24–36)

## 2019-05-26 NOTE — Progress Notes (Signed)
LVM with [place] Chard Dr. Wynelle Link office . Pt. Has no instruction on stopping ASA 325 and Plavix prior to surgery. Also his clearance from pcp stated await cards.? Needs cardiac clearance .

## 2019-05-26 NOTE — Telephone Encounter (Signed)
If the patient has not had a coronary intervention in the past 1 year Plavix can be held for the procedure.  I prefer that the patient is on uninterrupted antiplatelet agent therapy with at least one medication and in this case uninterrupted aspirin therapy with 81 mg of coated aspirin is preferred.

## 2019-05-26 NOTE — Telephone Encounter (Signed)
I want to clarify that this does not qualify as a preop assessment since have not seen the patient for the past couple of months and do not understand his cardiovascular risk stratification without actually seeing him in the clinic.  I just wanted to clarify on this issue.  This is my opinion about antiplatelet therapy and not preop assessment.

## 2019-05-26 NOTE — Telephone Encounter (Signed)
Primary Cardiologist:Rajan R Revankar, MD  Chart reviewed as part of pre-operative protocol coverage. Because of Stephen Medina's past medical history and time since last visit, he/she will require a follow-up visit in order to better assess preoperative cardiovascular risk.  Pre-op covering staff: - Please schedule appointment and call patient to inform them. - Please contact requesting surgeon's office via preferred method (i.e, phone, fax) to inform them of need for appointment prior to surgery.  If applicable, this message will also be routed to pharmacy pool and/or primary cardiologist for input on holding anticoagulant/antiplatelet agent as requested below so that this information is available at time of patient's appointment.   Deberah Pelton, NP  05/26/2019, 3:49 PM

## 2019-05-26 NOTE — Telephone Encounter (Signed)
I s/w the pt and advised he will need an appt for pre op clearance. I looked at Dr. Julien Nordmann schedule as well as Berniece Salines, DO though first available is the day of the pt's surgery. I assured the pt that I will send a message to Dr. Julien Nordmann scheduler and see if they can squeeze him in sooner. Pt thanked me for the help and will await our call.

## 2019-05-26 NOTE — Telephone Encounter (Signed)
   [facility] Pre-operative Risk Assessment    Request for surgical clearance:  1. What type of surgery is being performed? RIGHT TOTAL HIP ARTHROPLASTY   2. When is this surgery scheduled? 06/08/19   3. What type of clearance is required (medical clearance vs. Pharmacy clearance to hold med vs. Both)? MEDICAL  4. Are there any medications that need to be held prior to surgery and how long? PLAVIX AND ASA   5. Practice name and name of physician performing surgery? [facility]; DR. FRANK ALUISIO   6. What is your office phone number [phone]    7.   What is your office fax number [phone] ATTN; KELLY HANCOCK  8.   Anesthesia type (None, local, MAC, general) ? CHOICE   Julaine Hua 05/26/2019, 2:28 PM  _________________________________________________________________   (provider comments below)

## 2019-05-27 ENCOUNTER — Telehealth: Payer: Self-pay | Admitting: *Deleted

## 2019-05-27 LAB — ABO/RH: ABO/RH(D): O POS

## 2019-05-27 NOTE — Telephone Encounter (Signed)
   [facility] Pre-operative Risk Assessment    Request for surgical clearance:  1. What type of surgery is being performed? Right Total Hip arthroplasty   2. When is this surgery scheduled? 06/08/19, appt w/Revankar on 5/19 for surgical clearance   3. What type of clearance is required (medical clearance vs. Pharmacy clearance to hold med vs. Both)? Both  4. Are there any medications that need to be held prior to surgery and how long?Plavix and Aspirin   5. Practice name and name of physician performing surgery? [facility], Dr. Pilar Plate Aluisio  6. What is your office phone [phone]    7.   What is your office fax number [phone]  8.   Anesthesia type (None, local, MAC, general) ? Choice   Roselie Skinner 05/27/2019, 4:25 PM  _________________________________________________________________   (provider comments below)

## 2019-05-30 ENCOUNTER — Other Ambulatory Visit: Payer: Self-pay

## 2019-05-30 NOTE — Progress Notes (Signed)
Discontinued per patients request.

## 2019-05-30 NOTE — Telephone Encounter (Signed)
Pt has been scheduled to see Dr. Geraldo Pitter 06/01/19 for pre op clearance. I will forward clearance notes to MD for upcoming appt. I will remove from the pre op call back pool.

## 2019-06-01 ENCOUNTER — Encounter: Payer: Self-pay | Admitting: Cardiology

## 2019-06-01 ENCOUNTER — Other Ambulatory Visit: Payer: Self-pay

## 2019-06-01 ENCOUNTER — Telehealth (HOSPITAL_COMMUNITY): Payer: Self-pay

## 2019-06-01 ENCOUNTER — Ambulatory Visit (INDEPENDENT_AMBULATORY_CARE_PROVIDER_SITE_OTHER): Payer: Medicare Other | Admitting: Cardiology

## 2019-06-01 VITALS — BP 150/80 | HR 56 | Ht 72.0 in | Wt 294.0 lb

## 2019-06-01 DIAGNOSIS — I2510 Atherosclerotic heart disease of native coronary artery without angina pectoris: Secondary | ICD-10-CM

## 2019-06-01 DIAGNOSIS — E782 Mixed hyperlipidemia: Secondary | ICD-10-CM

## 2019-06-01 DIAGNOSIS — I10 Essential (primary) hypertension: Secondary | ICD-10-CM | POA: Diagnosis not present

## 2019-06-01 DIAGNOSIS — Z01810 Encounter for preprocedural cardiovascular examination: Secondary | ICD-10-CM

## 2019-06-01 DIAGNOSIS — R931 Abnormal findings on diagnostic imaging of heart and coronary circulation: Secondary | ICD-10-CM

## 2019-06-01 DIAGNOSIS — I77810 Thoracic aortic ectasia: Secondary | ICD-10-CM | POA: Insufficient documentation

## 2019-06-01 HISTORY — DX: Thoracic aortic ectasia: I77.810

## 2019-06-01 HISTORY — DX: Encounter for preprocedural cardiovascular examination: Z01.810

## 2019-06-01 NOTE — Progress Notes (Signed)
Cardiology Office Note:    Date:  06/01/2019   ID:  Stephen Medina, DOB Jun 16, 1950, MRN [id number]  PCP:  Sharilyn Sites, MD  Cardiologist:  Jenean Lindau, MD   Referring MD: Sharilyn Sites, MD    ASSESSMENT:    1. Coronary artery disease involving native coronary artery of native heart without angina pectoris   2. Essential hypertension   3. Mixed hyperlipidemia   4. Morbid obesity ([place])   5. Preop cardiovascular exam   6. Ascending aorta dilatation (HCC)    PLAN:    In order of problems listed above:  1. Coronary artery disease: Secondary prevention stressed with the patient.  Importance of compliance with diet and medication stressed and he vocalized understanding 2. Preoperative risk stratification: Patient has multiple comorbidities including morbid obesity, essential hypertension and dyslipidemia.  He denies any chest pain.  However in view of existing coronary artery disease I have recommended Lexiscan sestamibi and he is agreeable.  If this is negative then he is not at high risk for coronary events during the aforementioned surgery.  Meticulous hemodynamic monitoring and uninterrupted perioperative beta-blockade  Will further reduce the risk of coronary events. 3. Essential hypertension: Blood pressure stable echocardiogram will be done to assess murmur on auscultation 4. Mixed dyslipidemia: Patient is on statin therapy and diet was emphasized 5. Morbid obesity: I discussed with the patient at extensive length.  Weight reduction was stressed as of obesity explained and he vocalized understanding he plans to do better 6. Patient has ascending aortic dilation last time it was measured it was stable at 43 mm.  Since it has been quite some time I will do a CT scan without contrast to reassess this especially in view of the surgery coming. 7. Patient will be seen in follow-up appointment in 6 months or earlier if the patient has any concerns    Medication Adjustments/Labs and  Tests Ordered: Current medicines are reviewed at length with the patient today.  Concerns regarding medicines are outlined above.  No orders of the defined types were placed in this encounter.  No orders of the defined types were placed in this encounter.    Chief Complaint  Patient presents with  . Pre-op Exam    Rt hip replacement     History of Present Illness:    Stephen Medina is a 69 y.o. male.  Patient has past medical history of coronary artery disease with coronary stenting many years ago.  He has history of essential hypertension dyslipidemia and morbid obesity.  He is planning to undergo right hip replacement surgery.  Patient denies any problems at this time and takes care of activities of daily living.  He leads a sedentary lifestyle overall because of significant issues with his hip and his knees.  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  At the time of my evaluation, the patient is alert awake oriented and in no distress.  He denies any chest pain orthopnea or PND.  Past Medical History:  Diagnosis Date  . CAD (coronary artery disease) 08/01/2013   Cath 2005  normal Left main, scattered irregularities LAD, 80% stenosis ostial Diag 1, scattered irregularities CFX, scattered irregularities RCA  Taxus stent March 2005 Diagonal   . Disc displacement, lumbar   . Foot ulceration ([place])    left  . GERD (gastroesophageal reflux disease)   . History of ulcer disease 10/19/2013  . Hyperlipidemia   .  Hypertension   . Lumbar spondylosis 04/01/2013  . Morbid obesity ([place])   . Neuromuscular disorder (HCC)    neuropathy left foot   . Wears glasses     Past Surgical History:  Procedure Laterality Date  . APPENDECTOMY    . BACK SURGERY    . BONE EXOSTOSIS EXCISION Left 01/28/2018   Procedure: Exostectomy left first metatarsal base and medial cuneiform;  Surgeon: Wylene Simmer, MD;  Location: [place];  Service: Orthopedics;  Laterality: Left;  36min  . CARDIAC CATHETERIZATION     05    1 stent   dr Wynonia Lawman  . ESOPHAGOGASTRODUODENOSCOPY N/A 08/02/2013   Procedure: ESOPHAGOGASTRODUODENOSCOPY (EGD);  Surgeon: Ladene Artist, MD;  Location: [facility];  Service: Endoscopy;  Laterality: N/A;  . FOOT ARTHRODESIS Left 05/22/2016   Procedure: Left Subtalar and Talonavicular Joint Arthrodesis;  Surgeon: Wylene Simmer, MD;  Location: [place];  Service: Orthopedics;  Laterality: Left;  . FOOT SURGERY    . GASTROCNEMIUS RECESSION Left 05/22/2016   Procedure: Left Gastroc Recession;  Surgeon: Wylene Simmer, MD;  Location: [place];  Service: Orthopedics;  Laterality: Left;  Marland Kitchen GRAFT APPLICATION N/A [id number]   Procedure: INTEGRA GRAFT APPLICATION;  Surgeon: Trula Slade, DPM;  Location: [place];  Service: Podiatry;  Laterality: N/A;  . LEFT HEART CATHETERIZATION WITH CORONARY ANGIOGRAM N/A 02/09/2014   Procedure: LEFT HEART CATHETERIZATION WITH CORONARY ANGIOGRAM;  Surgeon: Peter M Martinique, MD;  Location: [facility] CATH LAB;  Service: Cardiovascular;  Laterality: N/A;  . LUMBAR LAMINECTOMY     x2  . LUMBAR LAMINECTOMY/DECOMPRESSION MICRODISCECTOMY Left 09/03/2015   Procedure: MICRODISCECTOMY L5-S1 LEFT;  Surgeon: Ashok Pall, MD;  Location: [place] [facility]URO ORS;  Service: Neurosurgery;  Laterality: Left;   MICRODISCECTOMY L5-S1 LEFT  . LUMBAR LAMINECTOMY/DECOMPRESSION MICRODISCECTOMY Left 02/13/2016   Procedure: MICRODISCECTOMY LUMBAR FIVE- SACRAL ONE LEFT;  Surgeon: Ashok Pall, MD;  Location: [place];  Service: Neurosurgery;  Laterality: Left;  . TONSILLECTOMY    . WOUND DEBRIDEMENT N/A 06/22/2015   Procedure: DEBRIDEMENT WOUND;  Surgeon: Trula Slade, DPM;  Location: [place];  Service: Podiatry;  Laterality: N/A;    Current Medications: Current Meds  Medication Sig  . acetaminophen (TYLENOL) 325 MG tablet Take 650 mg by  mouth every 4 (four) hours as needed for pain.  Marland Kitchen aspirin 325 MG EC tablet Take 325 mg by mouth daily.   . Cholecalciferol (VITAMIN D-3) 125 MCG (5000 UT) TABS Take 500 mg by mouth daily.  . clopidogrel (PLAVIX) 75 MG tablet TAKE 1 TABLET BY MOUTH EVERY DAY  . ezetimibe (ZETIA) 10 MG tablet TAKE 1 TABLET BY MOUTH DAILY  . meloxicam (MOBIC) 15 MG tablet Take 15 mg by mouth daily.   . metoprolol succinate (TOPROL-XL) 25 MG 24 hr tablet TAKE 1 TABLET BY MOUTH EVERY DAY  . nitroGLYCERIN (NITROSTAT) 0.4 MG SL tablet Place 1 tablet (0.4 mg total) under the tongue every 5 (five) minutes as needed.  . pantoprazole (PROTONIX) 40 MG tablet TAKE 1 TABLET BY MOUTH DAILY  . rosuvastatin (CRESTOR) 40 MG tablet Take 0.5 tablets (20 mg total) by mouth daily.  Marland Kitchen zinc gluconate 50 MG tablet Take 50 mg by mouth daily.     Allergies:   Adhesive [tape] and Other   Social History   Socioeconomic History  . Marital status: Married    Spouse name: Not on file  . Number of children: Not on file  .  Years of education: Not on file  . Highest education level: Not on file  Occupational History  . Not on file  Tobacco Use  . Smoking status: Never Smoker  . Smokeless tobacco: Former Systems developer    Types: Chew  Substance and Sexual Activity  . Alcohol use: No    Alcohol/week: 1.0 standard drinks    Types: 1 Glasses of wine per week  . Drug use: No  . Sexual activity: Not Currently  Other Topics Concern  . Not on file  Social History Narrative   Pt lives in [place] with his wife. They have one son who does not live in the area. Pt previously worked for [employer] but is retired. He used to [employer]. He has never smoke and drinks 4-5 alcoholic beverages per week. No illicit drug use.   Social Determinants of Health   Financial Resource Strain:   . Difficulty of Paying Living Expenses:   Food Insecurity:   . Worried About Charity fundraiser in the Last Year:   . Arboriculturist in the Last  Year:   Transportation Needs:   . Film/video editor (Medical):   Marland Kitchen Lack of Transportation (Non-Medical):   Physical Activity:   . Days of Exercise per Week:   . Minutes of Exercise per Session:   Stress:   . Feeling of Stress :   Social Connections:   . Frequency of Communication with Friends and Family:   . Frequency of Social Gatherings with Friends and Family:   . Attends Religious Services:   . Active Member of Clubs or Organizations:   . Attends Archivist Meetings:   Marland Kitchen Marital Status:      Family History: The patient's family history includes CAD in his cousin; Cancer in his brother; Coronary artery disease in his father. There is no history of Colon cancer, Pancreatic cancer, Rectal cancer, or Stomach cancer.  ROS:   Please see the history of present illness.    All other systems reviewed and are negative.  EKGs/Labs/Other Studies Reviewed:    The following studies were reviewed today: Study Conclusions   - Left ventricle: The cavity size was normal. There was mild  concentric hypertrophy. Systolic function was normal. The  estimated ejection fraction was in the range of 55% to 60%. Wall  motion was normal; there were no regional wall motion  abnormalities. Doppler parameters are consistent with abnormal  left ventricular relaxation (grade 1 diastolic dysfunction).  There was no evidence of elevated ventricular filling pressure by  Doppler parameters.  - Aortic valve: Trileaflet; mildly thickened leaflets.  Transvalvular velocity was within the normal range. There was no  stenosis.  - Aortic root: The aortic root was mildly dilated measuring 42 mm.  - Ascending aorta: The ascending aorta was mildly dilated measuring  43 mm.  - Mitral valve: There was no regurgitation.  - Right ventricle: Systolic function was normal.  - Right atrium: The atrium was normal in size.  - Tricuspid valve: There was trivial regurgitation.  -  Pulmonary arteries: Systolic pressure was within the normal  range.  - Inferior vena cava: The vessel was normal in size. The  respirophasic diameter changes were in the normal range (= 50%),  consistent with normal central venous pressure.  - Pericardium, extracardiac: There was no pericardial effusion.   Impressions:   - The ascending aorta was mildly dilated measuring 43 mm. This is  stable since the prior study in  2015.   Study Highlights    Nuclear stress EF: 32%.  The left ventricular ejection fraction is moderately decreased (30-44%).  There was no ST segment deviation noted during stress.  This is an intermediate risk study.   Normal resting and stress perfusion. No ischemia or infarction EF Estimated at 32% with diffuse hypokinesis and apical dyskinesis Note EF was normal by TTE 2018 Suggest correlation with MRI Or updated TTE       Recent Labs: 05/26/2019: ALT 20; BUN 12; Creatinine, Ser 0.89; Hemoglobin 14.5; Platelets 242; Potassium 4.2; Sodium 140  Recent Lipid Panel    Component Value Date/Time   CHOL 179 11/30/2017 1420   TRIG 121 11/30/2017 1420   HDL 52 11/30/2017 1420   CHOLHDL 3.4 11/30/2017 1420   LDLCALC 103 (H) 11/30/2017 1420    Physical Exam:    VS:  BP (!) 150/80 (BP Location: Left Arm, Patient Position: Sitting, Cuff Size: Large)   Pulse (!) 56   Ht 6' (1.829 m)   Wt 294 lb (133.4 kg)   SpO2 96%   BMI 39.87 kg/m     Wt Readings from Last 3 Encounters:  06/01/19 294 lb (133.4 kg)  05/26/19 291 lb (132 kg)  05/26/19 289 lb (131.1 kg)     GEN: Patient is in no acute distress HEENT: Normal NECK: No JVD; No carotid bruits LYMPHATICS: No lymphadenopathy CARDIAC: Hear sounds regular, 2/6 systolic murmur at the apex. RESPIRATORY:  Clear to auscultation without rales, wheezing or rhonchi  ABDOMEN: Soft, non-tender, non-distended MUSCULOSKELETAL:  No edema; No deformity  SKIN: Warm and dry NEUROLOGIC:  Alert and oriented x  3 PSYCHIATRIC:  Normal affect   Signed, Jenean Lindau, MD  06/01/2019 10:33 AM    [facility]

## 2019-06-01 NOTE — Telephone Encounter (Signed)
Detailed instructions left on the patient's answering machine per DPR. Asked to call back with any questions. S.Fradel Baldonado EMTP 

## 2019-06-01 NOTE — Patient Instructions (Signed)
Medication Instructions:  No medication changes. *If you need a refill on your cardiac medications before your next appointment, please call your pharmacy*   Lab Work: None ordered If you have labs (blood work) drawn today and your tests are completely normal, you will receive your results only by: Marland Kitchen MyChart Message (if you have MyChart) OR . A paper copy in the mail If you have any lab test that is abnormal or we need to change your treatment, we will call you to review the results.   Testing/Procedures: Your physician has requested that you have a lexiscan myoview. For further information please visit [email or web address]. Please follow instruction sheet, as given.  The test will take 2 days and approximately 3 to 4 hours each day to complete; you may bring reading material.  If someone comes with you to your appointment, they will need to remain in the main lobby due to limited space in the testing area. How to prepare for your Myocardial Perfusion Test: . Do not eat or drink 3 hours prior to your test, except you may have water. . Do not consume products containing caffeine (regular or decaffeinated) 12 hours prior to your test. (ex: coffee, chocolate, sodas, tea). . Do bring a list of your current medications with you.  If not listed below, you may take your medications as normal. . Do wear comfortable clothes (no dresses or overalls) and walking shoes, tennis shoes preferred (No heels or open toe shoes are allowed). . Do NOT wear cologne, perfume, aftershave, or lotions (deodorant is allowed). . If these instructions are not followed, your test will have to be rescheduled.  Your physician has requested that you have an echocardiogram. Echocardiography is a painless test that uses sound waves to create images of your heart. It provides your doctor with information about the size and shape of your heart and how well your heart's chambers and valves are working. This procedure takes  approximately one hour. There are no restrictions for this procedure.  Non-Cardiac CT scanning, (CAT scanning), is a noninvasive, special x-ray that produces cross-sectional images of the body using x-rays and a computer. CT scans help physicians diagnose and treat medical conditions. For some CT exams, a contrast material is used to enhance visibility in the area of the body being studied. CT scans provide greater clarity and reveal more details than regular x-ray exams.        Follow-Up: At [facility], you and your health needs are our priority.  As part of our continuing mission to provide you with exceptional heart care, we have created designated Provider Care Teams.  These Care Teams include your primary Cardiologist (physician) and Advanced Practice Providers (APPs -  Physician Assistants and Nurse Practitioners) who all work together to provide you with the care you need, when you need it.  We recommend signing up for the patient portal called "MyChart".  Sign up information is provided on this After Visit Summary.  MyChart is used to connect with patients for Virtual Visits (Telemedicine).  Patients are able to view lab/test results, encounter notes, upcoming appointments, etc.  Non-urgent messages can be sent to your provider as well.   To learn more about what you can do with MyChart, go to [email or web address].    Your next appointment:   2 month(s)  The format for your next appointment:   In Person  Provider:   Jyl Heinz, MD   Other Instructions  Echocardiogram An echocardiogram is a procedure that uses painless  sound waves (ultrasound) to produce an image of the heart. Images from an echocardiogram can provide important information about:  Signs of coronary artery disease (CAD).  Aneurysm detection. An aneurysm is a weak or damaged part of an artery wall that bulges out from the normal force of blood pumping through the body.  Heart size and shape. Changes  in the size or shape of the heart can be associated with certain conditions, including heart failure, aneurysm, and CAD.  Heart muscle function.  Heart valve function.  Signs of a past heart attack.  Fluid buildup around the heart.  Thickening of the heart muscle.  A tumor or infectious growth around the heart valves. Tell a health care provider about:  Any allergies you have.  All medicines you are taking, including vitamins, herbs, eye drops, creams, and over-the-counter medicines.  Any blood disorders you have.  Any surgeries you have had.  Any medical conditions you have.  Whether you are pregnant or may be pregnant. What are the risks? Generally, this is a safe procedure. However, problems may occur, including:  Allergic reaction to dye (contrast) that may be used during the procedure. What happens before the procedure? No specific preparation is needed. You may eat and drink normally. What happens during the procedure?   An IV tube may be inserted into one of your veins.  You may receive contrast through this tube. A contrast is an injection that improves the quality of the pictures from your heart.  A gel will be applied to your chest.  A wand-like tool (transducer) will be moved over your chest. The gel will help to transmit the sound waves from the transducer.  The sound waves will harmlessly bounce off of your heart to allow the heart images to be captured in real-time motion. The images will be recorded on a computer. The procedure may vary among health care providers and hospitals. What happens after the procedure?  You may return to your normal, everyday life, including diet, activities, and medicines, unless your health care provider tells you not to do that. Summary  An echocardiogram is a procedure that uses painless sound waves (ultrasound) to produce an image of the heart.  Images from an echocardiogram can provide important information about the  size and shape of your heart, heart muscle function, heart valve function, and fluid buildup around your heart.  You do not need to do anything to prepare before this procedure. You may eat and drink normally.  After the echocardiogram is completed, you may return to your normal, everyday life, unless your health care provider tells you not to do that. This information is not intended to replace advice given to you by your health care provider. Make sure you discuss any questions you have with your health care provider. Document Revised: 04/22/2018 Document Reviewed: 02/02/2016 Elsevier Patient Education  [place].

## 2019-06-02 ENCOUNTER — Ambulatory Visit (HOSPITAL_BASED_OUTPATIENT_CLINIC_OR_DEPARTMENT_OTHER): Payer: Medicare Other

## 2019-06-02 VITALS — Ht 72.0 in | Wt 294.0 lb

## 2019-06-02 DIAGNOSIS — E782 Mixed hyperlipidemia: Secondary | ICD-10-CM | POA: Diagnosis not present

## 2019-06-02 DIAGNOSIS — R931 Abnormal findings on diagnostic imaging of heart and coronary circulation: Secondary | ICD-10-CM

## 2019-06-02 DIAGNOSIS — I77810 Thoracic aortic ectasia: Secondary | ICD-10-CM | POA: Diagnosis present

## 2019-06-02 DIAGNOSIS — Z01810 Encounter for preprocedural cardiovascular examination: Secondary | ICD-10-CM | POA: Diagnosis present

## 2019-06-02 DIAGNOSIS — I2510 Atherosclerotic heart disease of native coronary artery without angina pectoris: Secondary | ICD-10-CM | POA: Diagnosis not present

## 2019-06-02 DIAGNOSIS — I10 Essential (primary) hypertension: Secondary | ICD-10-CM

## 2019-06-02 MED ORDER — TECHNETIUM TC 99M TETROFOSMIN IV KIT
32.4000 | PACK | Freq: Once | INTRAVENOUS | Status: AC | PRN
  Administered 2019-06-02: 32.4 via INTRAVENOUS
  Filled 2019-06-02: qty 33

## 2019-06-02 MED ORDER — REGADENOSON 0.4 MG/5ML IV SOLN
0.4000 mg | Freq: Once | INTRAVENOUS | Status: AC
Start: 2019-06-02 — End: 2019-06-02
  Administered 2019-06-02: 0.4 mg via INTRAVENOUS

## 2019-06-03 ENCOUNTER — Telehealth: Payer: Self-pay

## 2019-06-03 ENCOUNTER — Ambulatory Visit (HOSPITAL_COMMUNITY): Payer: Medicare Other

## 2019-06-03 ENCOUNTER — Ambulatory Visit (HOSPITAL_BASED_OUTPATIENT_CLINIC_OR_DEPARTMENT_OTHER)
Admission: RE | Admit: 2019-06-03 | Discharge: 2019-06-03 | Disposition: A | Discharge status: Home / Self Care | Payer: Medicare Other | Source: Ambulatory Visit | Attending: Cardiology | Admitting: Cardiology

## 2019-06-03 ENCOUNTER — Other Ambulatory Visit: Payer: Self-pay

## 2019-06-03 DIAGNOSIS — I2510 Atherosclerotic heart disease of native coronary artery without angina pectoris: Secondary | ICD-10-CM | POA: Diagnosis not present

## 2019-06-03 DIAGNOSIS — E782 Mixed hyperlipidemia: Secondary | ICD-10-CM

## 2019-06-03 DIAGNOSIS — Z01810 Encounter for preprocedural cardiovascular examination: Secondary | ICD-10-CM

## 2019-06-03 DIAGNOSIS — R931 Abnormal findings on diagnostic imaging of heart and coronary circulation: Secondary | ICD-10-CM

## 2019-06-03 DIAGNOSIS — I10 Essential (primary) hypertension: Secondary | ICD-10-CM

## 2019-06-03 DIAGNOSIS — I77810 Thoracic aortic ectasia: Secondary | ICD-10-CM

## 2019-06-03 LAB — MYOCARDIAL PERFUSION IMAGING
LV dias vol: 120 mL (ref 62–150)
LV sys vol: 51 mL
Peak HR: 58 {beats}/min
Rest HR: 49 {beats}/min
SDS: 0
SRS: 0
SSS: 0
TID: 1.04

## 2019-06-03 MED ORDER — PERFLUTREN LIPID MICROSPHERE
1.0000 mL | INTRAVENOUS | Status: AC | PRN
  Administered 2019-06-03: 3 mL via INTRAVENOUS
  Filled 2019-06-03: qty 10

## 2019-06-03 MED ORDER — ROSUVASTATIN CALCIUM 40 MG PO TABS: 20.0000 mg | ORAL_TABLET | Freq: Every day | ORAL | 3 refills | Status: DC

## 2019-06-03 MED ORDER — TECHNETIUM TC 99M TETROFOSMIN IV KIT
31.5000 | PACK | Freq: Once | INTRAVENOUS | Status: AC | PRN
  Administered 2019-06-03: 31.5 via INTRAVENOUS
  Filled 2019-06-03: qty 32

## 2019-06-03 NOTE — Telephone Encounter (Signed)
Refill sent for Rosuvastatin 40mg  into [facility] as patient requested a 90 day supply.

## 2019-06-04 ENCOUNTER — Other Ambulatory Visit (HOSPITAL_COMMUNITY)
Admission: RE | Admit: 2019-06-04 | Discharge: 2019-06-04 | Disposition: A | Discharge status: Home / Self Care | Payer: Medicare Other | Source: Ambulatory Visit | Attending: Orthopedic Surgery | Admitting: Orthopedic Surgery

## 2019-06-04 DIAGNOSIS — Z01812 Encounter for preprocedural laboratory examination: Secondary | ICD-10-CM | POA: Insufficient documentation

## 2019-06-04 DIAGNOSIS — Z20822 Contact with and (suspected) exposure to covid-19: Secondary | ICD-10-CM | POA: Diagnosis not present

## 2019-06-04 LAB — SARS CORONAVIRUS 2 (TAT 6-24 HRS): SARS Coronavirus 2: NEGATIVE

## 2019-06-06 ENCOUNTER — Telehealth: Payer: Self-pay | Admitting: Cardiology

## 2019-06-06 NOTE — Telephone Encounter (Signed)
   Pt calling to schedule CT. He said if its possible to come in tomorrow  Please advise

## 2019-06-06 NOTE — H&P (Signed)
TOTAL HIP ADMISSION H&P  Patient is admitted for right total hip arthroplasty.  Subjective:  Chief Complaint: right hip pain  HPI: Stephen Medina, 69 y.o. male, has a history of pain and functional disability in the right hip(s) due to arthritis and patient has failed non-surgical conservative treatments for greater than 12 weeks to include NSAID's and/or analgesics, use of assistive devices and activity modification.  Onset of symptoms was gradual starting 3 years ago with gradually worsening course since that time.The patient noted no past surgery on the right hip(s).  Patient currently rates pain in the right hip at 8 out of 10 with activity. Patient has worsening of pain with activity and weight bearing and pain that interfers with activities of daily living. Patient has evidence of joint space narrowing by imaging studies. This condition presents safety issues increasing the risk of falls.  There is no current active infection.  Patient Active Problem List   Diagnosis Date Noted  . Preop cardiovascular exam 06/01/2019  . Ascending aorta dilatation (HCC) 06/01/2019  . Ankle arthritis 10/20/2018  . Primary localized osteoarthrosis of ankle and foot 06/30/2018  . Mixed dyslipidemia 11/27/2017  . Pain in right knee 05/28/2017  . HNP (herniated nucleus pulposus), lumbar 09/03/2015  . Ulcer of toe of left foot ([place]) 04/16/2015  . Nonhealing ulcer of left lower extremity ([place]) 04/16/2015  . Ulcer of left foot ([place]) 12/04/2014  . Essential hypertension 02/02/2014  . Morbid obesity ([place])   . Hyperlipidemia   . History of ulcer disease 10/19/2013  . CAD (coronary artery disease) 08/01/2013  . Lumbar spondylosis 04/01/2013  . History of heart artery stent 05/14/2002   Past Medical History:  Diagnosis Date  . CAD (coronary artery disease) 08/01/2013   Cath 2005  normal Left main, scattered irregularities LAD, 80% stenosis ostial Diag 1, scattered irregularities CFX, scattered irregularities  RCA  Taxus stent March 2005 Diagonal   . Disc displacement, lumbar   . Foot ulceration ([place])    left  . GERD (gastroesophageal reflux disease)   . History of ulcer disease 10/19/2013  . Hyperlipidemia   . Hypertension   . Lumbar spondylosis 04/01/2013  . Morbid obesity ([place])   . Neuromuscular disorder (HCC)    neuropathy left foot   . Wears glasses     Past Surgical History:  Procedure Laterality Date  . APPENDECTOMY    . BACK SURGERY    . BONE EXOSTOSIS EXCISION Left 01/28/2018   Procedure: Exostectomy left first metatarsal base and medial cuneiform;  Surgeon: Wylene Simmer, MD;  Location: [place];  Service: Orthopedics;  Laterality: Left;  20min  . CARDIAC CATHETERIZATION     05    1 stent   dr Wynonia Lawman  . ESOPHAGOGASTRODUODENOSCOPY N/A 08/02/2013   Procedure: ESOPHAGOGASTRODUODENOSCOPY (EGD);  Surgeon: Ladene Artist, MD;  Location: [facility] ENDOSCOPY;  Service: Endoscopy;  Laterality: N/A;  . FOOT ARTHRODESIS Left 05/22/2016   Procedure: Left Subtalar and Talonavicular Joint Arthrodesis;  Surgeon: Wylene Simmer, MD;  Location: [place];  Service: Orthopedics;  Laterality: Left;  . FOOT SURGERY    . GASTROCNEMIUS RECESSION Left 05/22/2016   Procedure: Left Gastroc Recession;  Surgeon: Wylene Simmer, MD;  Location: [place];  Service: Orthopedics;  Laterality: Left;  Marland Kitchen GRAFT APPLICATION N/A [id number]   Procedure: INTEGRA GRAFT APPLICATION;  Surgeon: Trula Slade, DPM;  Location: [place];  Service: Podiatry;  Laterality: N/A;  . LEFT HEART CATHETERIZATION WITH CORONARY  ANGIOGRAM N/A 02/09/2014   Procedure: LEFT HEART CATHETERIZATION WITH CORONARY ANGIOGRAM;  Surgeon: Peter M Martinique, MD;  Location: [facility] CATH LAB;  Service: Cardiovascular;  Laterality: N/A;  . LUMBAR LAMINECTOMY     x2  . LUMBAR LAMINECTOMY/DECOMPRESSION MICRODISCECTOMY Left 09/03/2015   Procedure: MICRODISCECTOMY L5-S1 LEFT;  Surgeon: Ashok Pall, MD;   Location: [place] NEURO ORS;  Service: Neurosurgery;  Laterality: Left;   MICRODISCECTOMY L5-S1 LEFT  . LUMBAR LAMINECTOMY/DECOMPRESSION MICRODISCECTOMY Left 02/13/2016   Procedure: MICRODISCECTOMY LUMBAR FIVE- SACRAL ONE LEFT;  Surgeon: Ashok Pall, MD;  Location: [place];  Service: Neurosurgery;  Laterality: Left;  . TONSILLECTOMY    . WOUND DEBRIDEMENT N/A 06/22/2015   Procedure: DEBRIDEMENT WOUND;  Surgeon: Trula Slade, DPM;  Location: [place];  Service: Podiatry;  Laterality: N/A;    No current facility-administered medications for this encounter.   Current Outpatient Medications  Medication Sig Dispense Refill Last Dose  . acetaminophen (TYLENOL) 325 MG tablet Take 650 mg by mouth every 4 (four) hours as needed for pain.     Marland Kitchen aspirin 325 MG EC tablet Take 325 mg by mouth daily.      . Cholecalciferol (VITAMIN D-3) 125 MCG (5000 UT) TABS Take 500 mg by mouth daily.     . clopidogrel (PLAVIX) 75 MG tablet TAKE 1 TABLET BY MOUTH EVERY DAY 90 tablet 1   . ezetimibe (ZETIA) 10 MG tablet TAKE 1 TABLET BY MOUTH DAILY 90 tablet 1   . meloxicam (MOBIC) 15 MG tablet Take 15 mg by mouth daily.      . metoprolol succinate (TOPROL-XL) 25 MG 24 hr tablet TAKE 1 TABLET BY MOUTH EVERY DAY 90 tablet 1   . nitroGLYCERIN (NITROSTAT) 0.4 MG SL tablet Place 1 tablet (0.4 mg total) under the tongue every 5 (five) minutes as needed. 25 tablet 11   . pantoprazole (PROTONIX) 40 MG tablet TAKE 1 TABLET BY MOUTH DAILY 30 tablet 3   . zinc gluconate 50 MG tablet Take 50 mg by mouth daily.     . rosuvastatin (CRESTOR) 40 MG tablet Take 0.5 tablets (20 mg total) by mouth daily. 45 tablet 3    Facility-Administered Medications Ordered in Other Encounters  Medication Dose Route Frequency Provider Last Rate Last Admin  . technetium tetrofosmin (TC-MYOVIEW) injection 99991111 millicurie  99991111 millicurie Intravenous Once PRN Josue Hector, MD       Allergies  Allergen Reactions  . Adhesive [Tape] Other  (See Comments)    Blisters.  Paper tape ok.  . Other Other (See Comments)    Blisters.  Paper tape ok.    Social History   Tobacco Use  . Smoking status: Never Smoker  . Smokeless tobacco: Former Systems developer    Types: Chew  Substance Use Topics  . Alcohol use: No    Alcohol/week: 1.0 standard drinks    Types: 1 Glasses of wine per week    Family History  Problem Relation Age of Onset  . Coronary artery disease Father        open heart surgery   . Cancer Brother   . CAD Cousin        requiring stent  . Colon cancer Neg Hx   . Pancreatic cancer Neg Hx   . Rectal cancer Neg Hx   . Stomach cancer Neg Hx      Review of Systems  Constitutional: Negative for chills and fever.  Respiratory: Negative for cough and shortness of breath.   Cardiovascular:  Negative for chest pain.  Gastrointestinal: Negative for nausea and vomiting.  Musculoskeletal: Positive for arthralgias.    Objective:  Physical Exam Patient is a 69 year old male.  Obese and well developed. General: Alert and oriented x3, cooperative and pleasant, no acute distress. Head: normocephalic, atraumatic, neck supple. Eyes: EOMI. Respiratory: breath sounds clear in all fields, no wheezing, rales, or rhonchi. Cardiovascular: Regular rate and rhythm, no murmurs, gallops or rubs. Abdomen: non-tender to palpation and soft, normoactive bowel sounds.  Musculoskeletal: Right Hip Exam: The range of motion: Flexion to 90 degrees. No internal or external rotation or abduction or adduction. There is no tenderness over the greater trochanteric bursa.  Calves soft and nontender. Motor function intact in LE. Strength 5/5 LE bilaterally. Neuro: Distal pulses 2+. Sensation to light touch intact in LE.  Vital signs in last 24 hours:    Labs:   Estimated body mass index is 39.87 kg/m as calculated from the following:   Height as of 06/02/19: 6' (1.829 m).   Weight as of 06/02/19: 133.4 kg.   Imaging Review Plain  radiographs demonstrate severe degenerative joint disease of the right hip(s). The bone quality appears to be adequate for age and reported activity level.  Assessment/Plan:  End stage arthritis, right hip(s)  The patient history, physical examination, clinical judgement of the provider and imaging studies are consistent with end stage degenerative joint disease of the right hip(s) and total hip arthroplasty is deemed medically necessary. The treatment options including medical management, injection therapy, arthroscopy and arthroplasty were discussed at length. The risks and benefits of total hip arthroplasty were presented and reviewed. The risks due to aseptic loosening, infection, stiffness, dislocation/subluxation,  thromboembolic complications and other imponderables were discussed.  The patient acknowledged the explanation, agreed to proceed with the plan and consent was signed. Patient is being admitted for inpatient treatment for surgery, pain control, PT, OT, prophylactic antibiotics, VTE prophylaxis, progressive ambulation and ADL's and discharge planning.The patient is planning to be discharged home.   Therapy Plans: HEP Disposition: Home with wife Planned DVT Prophylaxis: Plavix & Aspirin 325 DME needed: none PCP: Dr. Hilma Favors, clearance received Cardiologist: Dr. Geraldo Pitter, clearance received TXA: IV Allergies: adhesives - blistering with adhesive bandage after cardiac stent Anesthesia Concerns: none BMI: 39.3 Not diabetic.   Other: Prefers same day discharge. Hx of cardiac stent in 2004, on Plavix & aspirin 325. Patient has had blistering with one bandage in the past, but does want to try aquacel.    - Patient was instructed on what medications to stop prior to surgery. - Follow-up visit in 2 weeks with Dr. Wynelle Link - Begin physical therapy following surgery - Pre-operative lab work as pre-surgical testing - Prescriptions will be provided in hospital at time of  discharge  Griffith Citron, PA-C Orthopedic Surgery [facility] [place] [phone]

## 2019-06-06 NOTE — Progress Notes (Signed)
Anesthesia Chart Review   Case: [id number] Date/Time: 06/08/19 1036   Procedure: TOTAL HIP ARTHROPLASTY ANTERIOR APPROACH (Right Hip) - 130min   Anesthesia type: Choice   Pre-op diagnosis: right hip osteoarthritis   [facility] ROOM 10 / [facility]   Surgeons: Gaynelle Arabian, MD      DISCUSSION:69 y.o. never smoker with h/o HTN, GERD, HLD, CAD (+stent 2005), right hip OA scheduled for above procedure 06/08/2019 with Dr. Gaynelle Arabian.   Pt last seen by cardiologist, Dr. Jyl Heinz, 06/01/2019.  Per OV note, "Preoperative risk stratification: Patient has multiple comorbidities including morbid obesity, essential hypertension and dyslipidemia.  He denies any chest pain.  However in view of existing coronary artery disease I have recommended Lexiscan sestamibi and he is agreeable.  If this is negative then he is not at high risk for coronary events during the aforementioned surgery.  Meticulous hemodynamic monitoring and uninterrupted perioperative beta-blockade  Will further reduce the risk of coronary events.  Patient has ascending aortic dilation last time it was measured it was stable at 107mm.  Since it has been quite some time I will do a CT scan without contrast to reassess this especially in view of the surgery coming."  Stress test 06/03/2019, low risk study, EF (55-60%).  CT Chest 06/07/2019 which shows stable ascending thoracic aortic aneurysm measuring 4.4cm.   Anticipate pt can proceed with planned procedure barring acute status change.   VS: Wt 132 kg   BMI 39.47 kg/m   PROVIDERS: Sharilyn Sites, MD is PCP   Jyl Heinz, MD is Cardiologist  LABS: Labs reviewed: Acceptable for surgery. (all labs ordered are listed, but only abnormal results are displayed)  Labs Reviewed  COMPREHENSIVE METABOLIC PANEL - Abnormal; Notable for the following components:      Result Value   Glucose, Bld 100 (*)    All other components within normal limits  SURGICAL PCR SCREEN  APTT  CBC   PROTIME-INR  TYPE AND SCREEN  ABO/RH     IMAGES: CT Chest 06/07/2019 IMPRESSION: 1. Stable 4.4 cm ascending thoracic aortic aneurysm. Recommend annual imaging followup by CTA or MRA. This recommendation follows 2010 ACCF/AHA/AATS/ACR/ASA/SCA/SCAI/SIR/STS/SVM Guidelines for the Diagnosis and Management of Patients with Thoracic Aortic Disease. Circulation. 2010; [id number]. Aortic aneurysm NOS (ICD10-I71.9) 2. No acute findings.  EKG: 06/01/2019 Rate 56 bpm Sinus bradycardia with 1st degree AV block   CV: Myocardial Perfusion 06/03/2019  The left ventricular ejection fraction is normal (55-65%).  Nuclear stress EF: 58%.  There was no ST segment deviation noted during stress.  This is a low risk study.  Compared to prior study, EF is improved on current study.  Echo 06/03/2019 IMPRESSIONS    1. TDS   . Left ventricular ejection fraction, by estimation, is 55 to 60%. The  left ventricle has normal function. The left ventricle has no regional  wall motion abnormalities. Left ventricular diastolic parameters are  consistent with Grade I diastolic  dysfunction (impaired relaxation).  2. Right ventricular systolic function is normal. The right ventricular  size is mildly enlarged.  3. Left atrial size was mildly dilated.  4. The mitral valve is normal in structure. No evidence of mitral valve  regurgitation. No evidence of mitral stenosis.  5. The aortic valve is normal in structure. Aortic valve regurgitation is  not visualized. No aortic stenosis is present.  6. The inferior vena cava is normal in size with greater than 50%  respiratory variability, suggesting right atrial pressure of 3 mmHg.  Past Medical History:  Diagnosis Date  . CAD (coronary artery disease) 08/01/2013   Cath 2005  normal Left main, scattered irregularities LAD, 80% stenosis ostial Diag 1, scattered irregularities CFX, scattered irregularities RCA  Taxus stent March 2005 Diagonal   .  Disc displacement, lumbar   . Foot ulceration ([place])    left  . GERD (gastroesophageal reflux disease)   . History of ulcer disease 10/19/2013  . Hyperlipidemia   . Hypertension   . Lumbar spondylosis 04/01/2013  . Morbid obesity ([place])   . Neuromuscular disorder (HCC)    neuropathy left foot   . Wears glasses     Past Surgical History:  Procedure Laterality Date  . APPENDECTOMY    . BACK SURGERY    . BONE EXOSTOSIS EXCISION Left 01/28/2018   Procedure: Exostectomy left first metatarsal base and medial cuneiform;  Surgeon: Wylene Simmer, MD;  Location: [place];  Service: Orthopedics;  Laterality: Left;  67min  . CARDIAC CATHETERIZATION     05    1 stent   dr Wynonia Lawman  . ESOPHAGOGASTRODUODENOSCOPY N/A 08/02/2013   Procedure: ESOPHAGOGASTRODUODENOSCOPY (EGD);  Surgeon: Ladene Artist, MD;  Location: [facility] ENDOSCOPY;  Service: Endoscopy;  Laterality: N/A;  . FOOT ARTHRODESIS Left 05/22/2016   Procedure: Left Subtalar and Talonavicular Joint Arthrodesis;  Surgeon: Wylene Simmer, MD;  Location: [place];  Service: Orthopedics;  Laterality: Left;  . FOOT SURGERY    . GASTROCNEMIUS RECESSION Left 05/22/2016   Procedure: Left Gastroc Recession;  Surgeon: Wylene Simmer, MD;  Location: [place];  Service: Orthopedics;  Laterality: Left;  Marland Kitchen GRAFT APPLICATION N/A [id number]   Procedure: INTEGRA GRAFT APPLICATION;  Surgeon: Trula Slade, DPM;  Location: [place];  Service: Podiatry;  Laterality: N/A;  . LEFT HEART CATHETERIZATION WITH CORONARY ANGIOGRAM N/A 02/09/2014   Procedure: LEFT HEART CATHETERIZATION WITH CORONARY ANGIOGRAM;  Surgeon: Peter M Martinique, MD;  Location[facility] CATH LAB;  Service: Cardiovascular;  Laterality: N/A;  . LUMBAR LAMINECTOMY     x2  . LUMBAR LAMINECTOMY/DECOMPRESSION MICRODISCECTOMY Left 09/03/2015   Procedure: MICRODISCECTOMY L5-S1 LEFT;  Surgeon: Ashok Pall, MD;  Location: [place] NEURO ORS;  Service: Neurosurgery;   Laterality: Left;   MICRODISCECTOMY L5-S1 LEFT  . LUMBAR LAMINECTOMY/DECOMPRESSION MICRODISCECTOMY Left 02/13/2016   Procedure: MICRODISCECTOMY LUMBAR FIVE- SACRAL ONE LEFT;  Surgeon: Ashok Pall, MD;  Location: [place];  Service: Neurosurgery;  Laterality: Left;  . TONSILLECTOMY    . WOUND DEBRIDEMENT N/A 06/22/2015   Procedure: DEBRIDEMENT WOUND;  Surgeon: Trula Slade, DPM;  Location: [place];  Service: Podiatry;  Laterality: N/A;    MEDICATIONS: . acetaminophen (TYLENOL) 325 MG tablet  . aspirin 325 MG EC tablet  . Cholecalciferol (VITAMIN D-3) 125 MCG (5000 UT) TABS  . clopidogrel (PLAVIX) 75 MG tablet  . ezetimibe (ZETIA) 10 MG tablet  . meloxicam (MOBIC) 15 MG tablet  . metoprolol succinate (TOPROL-XL) 25 MG 24 hr tablet  . nitroGLYCERIN (NITROSTAT) 0.4 MG SL tablet  . pantoprazole (PROTONIX) 40 MG tablet  . rosuvastatin (CRESTOR) 40 MG tablet  . zinc gluconate 50 MG tablet   No current facility-administered medications for this encounter.   . technetium tetrofosmin (TC-MYOVIEW) injection 99991111 millicurie    Maia Plan [facility] Pre-Surgical Testing [phone] 06/07/19  2:01 PM

## 2019-06-06 NOTE — Telephone Encounter (Signed)
Pt is aware that he has an appointment for 06/07/19 at 11:00 [facility].

## 2019-06-07 ENCOUNTER — Other Ambulatory Visit: Payer: Self-pay

## 2019-06-07 ENCOUNTER — Ambulatory Visit (HOSPITAL_BASED_OUTPATIENT_CLINIC_OR_DEPARTMENT_OTHER)
Admission: RE | Admit: 2019-06-07 | Discharge: 2019-06-07 | Disposition: A | Discharge status: Home / Self Care | Payer: Medicare Other | Source: Ambulatory Visit | Attending: Cardiology | Admitting: Cardiology

## 2019-06-07 DIAGNOSIS — I10 Essential (primary) hypertension: Secondary | ICD-10-CM | POA: Insufficient documentation

## 2019-06-07 DIAGNOSIS — I2510 Atherosclerotic heart disease of native coronary artery without angina pectoris: Secondary | ICD-10-CM | POA: Diagnosis not present

## 2019-06-07 DIAGNOSIS — E782 Mixed hyperlipidemia: Secondary | ICD-10-CM | POA: Insufficient documentation

## 2019-06-07 DIAGNOSIS — Z01810 Encounter for preprocedural cardiovascular examination: Secondary | ICD-10-CM | POA: Insufficient documentation

## 2019-06-07 DIAGNOSIS — R931 Abnormal findings on diagnostic imaging of heart and coronary circulation: Secondary | ICD-10-CM | POA: Diagnosis present

## 2019-06-07 DIAGNOSIS — I77810 Thoracic aortic ectasia: Secondary | ICD-10-CM | POA: Diagnosis present

## 2019-06-07 MED ORDER — DEXTROSE 5 % IV SOLN
3.0000 g | INTRAVENOUS | Status: AC
  Administered 2019-06-08: 3 g via INTRAVENOUS
  Filled 2019-06-07: qty 3
  Filled 2019-06-07: qty 3000

## 2019-06-07 NOTE — Anesthesia Preprocedure Evaluation (Addendum)
Anesthesia Evaluation  Patient identified by MRN, date of birth, ID band Patient awake    Reviewed: Allergy & Precautions, NPO status , Patient's Chart, lab work & pertinent test results, reviewed documented beta blocker date and time   Airway Mallampati: II  TM Distance: >3 FB Neck ROM: Full    Dental  (+) Teeth Intact, Dental Advisory Given   Pulmonary neg pulmonary ROS,    Pulmonary exam normal breath sounds clear to auscultation       Cardiovascular hypertension, Pt. on home beta blockers + CAD and + Cardiac Stents (RCA stent 2005)  Normal cardiovascular exam Rhythm:Regular Rate:Normal  Stress test 06/03/2019, low risk study, EF (55-60%).  CT Chest 06/07/2019 which shows stable ascending thoracic aortic aneurysm measuring 4.4cm.    Neuro/Psych  Neuromuscular disease    GI/Hepatic Neg liver ROS, GERD  Medicated and Controlled,  Endo/Other  Morbid obesity  Renal/GU negative Renal ROS     Musculoskeletal  (+) Arthritis ,   Abdominal   Peds  Hematology  (+) Blood dyscrasia (Plavix), , Plt 242k   Anesthesia Other Findings Day of surgery medications reviewed with the patient.  Reproductive/Obstetrics                            Anesthesia Physical Anesthesia Plan  ASA: III  Anesthesia Plan: General   Post-op Pain Management:    Induction: Intravenous  PONV Risk Score and Plan: 2 and Propofol infusion, Treatment may vary due to age or medical condition, Ondansetron and Dexamethasone  Airway Management Planned: Oral ETT  Additional Equipment:   Intra-op Plan:   Post-operative Plan: Extubation in OR  Informed Consent: I have reviewed the patients History and Physical, chart, labs and discussed the procedure including the risks, benefits and alternatives for the proposed anesthesia with the patient or authorized representative who has indicated his/her understanding and acceptance.      Dental advisory given  Plan Discussed with: CRNA  Anesthesia Plan Comments: (See PAT note 05/26/2019, Konrad Felix, PA-C)      Anesthesia Quick Evaluation

## 2019-06-08 ENCOUNTER — Ambulatory Visit (HOSPITAL_COMMUNITY): Payer: Medicare Other | Admitting: Registered Nurse

## 2019-06-08 ENCOUNTER — Other Ambulatory Visit: Payer: Self-pay

## 2019-06-08 ENCOUNTER — Ambulatory Visit (HOSPITAL_COMMUNITY): Payer: Medicare Other | Admitting: Physician Assistant

## 2019-06-08 ENCOUNTER — Observation Stay (HOSPITAL_COMMUNITY)
Admission: RE | Admit: 2019-06-08 | Discharge: 2019-06-09 | Disposition: A | Discharge status: Home / Self Care | Payer: Medicare Other | Attending: Orthopedic Surgery | Admitting: Orthopedic Surgery

## 2019-06-08 ENCOUNTER — Observation Stay (HOSPITAL_COMMUNITY): Payer: Medicare Other

## 2019-06-08 ENCOUNTER — Ambulatory Visit (HOSPITAL_COMMUNITY): Payer: Medicare Other

## 2019-06-08 ENCOUNTER — Encounter (HOSPITAL_COMMUNITY)
Admission: RE | Disposition: A | Discharge status: Home / Self Care | Payer: Self-pay | Source: Home / Self Care | Attending: Orthopedic Surgery

## 2019-06-08 ENCOUNTER — Encounter (HOSPITAL_COMMUNITY): Payer: Self-pay | Admitting: Orthopedic Surgery

## 2019-06-08 DIAGNOSIS — Z7902 Long term (current) use of antithrombotics/antiplatelets: Secondary | ICD-10-CM | POA: Diagnosis not present

## 2019-06-08 DIAGNOSIS — I2510 Atherosclerotic heart disease of native coronary artery without angina pectoris: Secondary | ICD-10-CM | POA: Diagnosis not present

## 2019-06-08 DIAGNOSIS — Z7982 Long term (current) use of aspirin: Secondary | ICD-10-CM | POA: Insufficient documentation

## 2019-06-08 DIAGNOSIS — Z01810 Encounter for preprocedural cardiovascular examination: Secondary | ICD-10-CM

## 2019-06-08 DIAGNOSIS — Z87898 Personal history of other specified conditions: Secondary | ICD-10-CM

## 2019-06-08 DIAGNOSIS — M19079 Primary osteoarthritis, unspecified ankle and foot: Secondary | ICD-10-CM

## 2019-06-08 DIAGNOSIS — M47816 Spondylosis without myelopathy or radiculopathy, lumbar region: Secondary | ICD-10-CM

## 2019-06-08 DIAGNOSIS — I10 Essential (primary) hypertension: Secondary | ICD-10-CM | POA: Insufficient documentation

## 2019-06-08 DIAGNOSIS — Z79899 Other long term (current) drug therapy: Secondary | ICD-10-CM | POA: Insufficient documentation

## 2019-06-08 DIAGNOSIS — Z791 Long term (current) use of non-steroidal anti-inflammatories (NSAID): Secondary | ICD-10-CM | POA: Diagnosis not present

## 2019-06-08 DIAGNOSIS — M5126 Other intervertebral disc displacement, lumbar region: Secondary | ICD-10-CM

## 2019-06-08 DIAGNOSIS — I77810 Thoracic aortic ectasia: Secondary | ICD-10-CM

## 2019-06-08 DIAGNOSIS — Z419 Encounter for procedure for purposes other than remedying health state, unspecified: Secondary | ICD-10-CM

## 2019-06-08 DIAGNOSIS — E782 Mixed hyperlipidemia: Secondary | ICD-10-CM | POA: Diagnosis not present

## 2019-06-08 DIAGNOSIS — K219 Gastro-esophageal reflux disease without esophagitis: Secondary | ICD-10-CM | POA: Insufficient documentation

## 2019-06-08 DIAGNOSIS — M1611 Unilateral primary osteoarthritis, right hip: Principal | ICD-10-CM | POA: Diagnosis present

## 2019-06-08 DIAGNOSIS — I712 Thoracic aortic aneurysm, without rupture: Secondary | ICD-10-CM | POA: Insufficient documentation

## 2019-06-08 DIAGNOSIS — M169 Osteoarthritis of hip, unspecified: Secondary | ICD-10-CM | POA: Diagnosis present

## 2019-06-08 DIAGNOSIS — Z6839 Body mass index (BMI) 39.0-39.9, adult: Secondary | ICD-10-CM | POA: Insufficient documentation

## 2019-06-08 DIAGNOSIS — Z955 Presence of coronary angioplasty implant and graft: Secondary | ICD-10-CM | POA: Insufficient documentation

## 2019-06-08 DIAGNOSIS — Z96649 Presence of unspecified artificial hip joint: Secondary | ICD-10-CM

## 2019-06-08 HISTORY — PX: TOTAL HIP ARTHROPLASTY: SHX124

## 2019-06-08 HISTORY — DX: Osteoarthritis of hip, unspecified: M16.9

## 2019-06-08 HISTORY — DX: Unilateral primary osteoarthritis, right hip: M16.11

## 2019-06-08 SURGERY — ARTHROPLASTY, HIP, TOTAL, ANTERIOR APPROACH
Anesthesia: General | Site: Hip | Laterality: Right

## 2019-06-08 MED ORDER — SUGAMMADEX SODIUM 500 MG/5ML IV SOLN
INTRAVENOUS | Status: DC | PRN
Start: 2019-06-08 — End: 2019-06-08
  Administered 2019-06-08: 350 mg via INTRAVENOUS

## 2019-06-08 MED ORDER — WATER FOR IRRIGATION, STERILE IR SOLN
Status: DC | PRN
  Administered 2019-06-08: 1000 mL

## 2019-06-08 MED ORDER — METHOCARBAMOL 500 MG PO TABS
500.0000 mg | ORAL_TABLET | Freq: Four times a day (QID) | ORAL | Status: DC | PRN
  Administered 2019-06-09: 500 mg via ORAL
  Filled 2019-06-08 (×2): qty 1

## 2019-06-08 MED ORDER — HYDROCODONE-ACETAMINOPHEN 5-325 MG PO TABS
1.0000 | ORAL_TABLET | ORAL | Status: DC | PRN
  Administered 2019-06-08: 2 via ORAL
  Administered 2019-06-08 (×2): 1 via ORAL
  Administered 2019-06-09: 2 via ORAL
  Filled 2019-06-08: qty 1
  Filled 2019-06-08: qty 2
  Filled 2019-06-08: qty 1
  Filled 2019-06-08: qty 2

## 2019-06-08 MED ORDER — DEXAMETHASONE SODIUM PHOSPHATE 10 MG/ML IJ SOLN
8.0000 mg | Freq: Once | INTRAMUSCULAR | Status: AC
  Administered 2019-06-08: 8 mg via INTRAVENOUS

## 2019-06-08 MED ORDER — FENTANYL CITRATE (PF) 100 MCG/2ML IJ SOLN
25.0000 ug | INTRAMUSCULAR | Status: DC | PRN
  Administered 2019-06-08 (×3): 50 ug via INTRAVENOUS

## 2019-06-08 MED ORDER — ACETAMINOPHEN 10 MG/ML IV SOLN: 1000.0000 mg | Freq: Four times a day (QID) | INTRAVENOUS | Status: DC

## 2019-06-08 MED ORDER — ACETAMINOPHEN 325 MG PO TABS: 325.0000 mg | ORAL_TABLET | Freq: Four times a day (QID) | ORAL | Status: DC | PRN

## 2019-06-08 MED ORDER — FENTANYL CITRATE (PF) 100 MCG/2ML IJ SOLN
INTRAMUSCULAR | Status: AC
  Filled 2019-06-08: qty 2

## 2019-06-08 MED ORDER — ACETAMINOPHEN 10 MG/ML IV SOLN
1000.0000 mg | Freq: Once | INTRAVENOUS | Status: AC
  Administered 2019-06-08: 1000 mg via INTRAVENOUS
  Filled 2019-06-08: qty 100

## 2019-06-08 MED ORDER — HYDROMORPHONE HCL 1 MG/ML IJ SOLN
INTRAMUSCULAR | Status: AC
  Administered 2019-06-08: 1 mg via INTRAVENOUS
  Filled 2019-06-08: qty 1

## 2019-06-08 MED ORDER — PHENOL 1.4 % MT LIQD: 1.0000 | OROMUCOSAL | Status: DC | PRN

## 2019-06-08 MED ORDER — PROPOFOL 10 MG/ML IV BOLUS
INTRAVENOUS | Status: AC
  Filled 2019-06-08: qty 20

## 2019-06-08 MED ORDER — PROPOFOL 500 MG/50ML IV EMUL
INTRAVENOUS | Status: DC | PRN
  Administered 2019-06-08: 50 ug/kg/min via INTRAVENOUS
  Administered 2019-06-08: 100 mg via INTRAVENOUS

## 2019-06-08 MED ORDER — DOCUSATE SODIUM 100 MG PO CAPS
100.0000 mg | ORAL_CAPSULE | Freq: Two times a day (BID) | ORAL | Status: DC
  Administered 2019-06-08 – 2019-06-09 (×2): 100 mg via ORAL
  Filled 2019-06-08 (×2): qty 1

## 2019-06-08 MED ORDER — DEXAMETHASONE SODIUM PHOSPHATE 10 MG/ML IJ SOLN: 10.0000 mg | Freq: Once | INTRAMUSCULAR | Status: DC

## 2019-06-08 MED ORDER — METHOCARBAMOL 500 MG IVPB - SIMPLE MED
500.0000 mg | Freq: Four times a day (QID) | INTRAVENOUS | Status: DC | PRN
  Administered 2019-06-08: 500 mg via INTRAVENOUS
  Filled 2019-06-08: qty 50

## 2019-06-08 MED ORDER — ROSUVASTATIN CALCIUM 20 MG PO TABS
20.0000 mg | ORAL_TABLET | Freq: Every day | ORAL | Status: DC
  Administered 2019-06-09: 20 mg via ORAL
  Filled 2019-06-08: qty 1

## 2019-06-08 MED ORDER — MAGNESIUM CITRATE PO SOLN: 1.0000 | Freq: Once | ORAL | Status: DC | PRN

## 2019-06-08 MED ORDER — METOPROLOL SUCCINATE ER 25 MG PO TB24
25.0000 mg | ORAL_TABLET | Freq: Every day | ORAL | Status: DC
  Administered 2019-06-09: 25 mg via ORAL
  Filled 2019-06-08: qty 1

## 2019-06-08 MED ORDER — CLOPIDOGREL BISULFATE 75 MG PO TABS
75.0000 mg | ORAL_TABLET | Freq: Every day | ORAL | Status: DC
  Administered 2019-06-09: 75 mg via ORAL
  Filled 2019-06-08: qty 1

## 2019-06-08 MED ORDER — ONDANSETRON HCL 4 MG/2ML IJ SOLN: 4.0000 mg | Freq: Four times a day (QID) | INTRAMUSCULAR | Status: DC | PRN

## 2019-06-08 MED ORDER — ASPIRIN EC 325 MG PO TBEC
325.0000 mg | DELAYED_RELEASE_TABLET | Freq: Every day | ORAL | Status: DC
  Administered 2019-06-09: 325 mg via ORAL
  Filled 2019-06-08: qty 1

## 2019-06-08 MED ORDER — ONDANSETRON HCL 4 MG PO TABS: 4.0000 mg | ORAL_TABLET | Freq: Four times a day (QID) | ORAL | Status: DC | PRN

## 2019-06-08 MED ORDER — ONDANSETRON HCL 4 MG/2ML IJ SOLN: 4.0000 mg | Freq: Once | INTRAMUSCULAR | Status: DC | PRN

## 2019-06-08 MED ORDER — ROCURONIUM BROMIDE 100 MG/10ML IV SOLN
INTRAVENOUS | Status: DC | PRN
  Administered 2019-06-08: 70 mg via INTRAVENOUS

## 2019-06-08 MED ORDER — POLYETHYLENE GLYCOL 3350 17 G PO PACK: 17.0000 g | PACK | Freq: Every day | ORAL | Status: DC | PRN

## 2019-06-08 MED ORDER — POVIDONE-IODINE 10 % EX SWAB
2.0000 "application " | Freq: Once | CUTANEOUS | Status: AC
  Administered 2019-06-08: 2 via TOPICAL

## 2019-06-08 MED ORDER — BISACODYL 10 MG RE SUPP: 10.0000 mg | Freq: Every day | RECTAL | Status: DC | PRN

## 2019-06-08 MED ORDER — MENTHOL 3 MG MT LOZG: 1.0000 | LOZENGE | OROMUCOSAL | Status: DC | PRN

## 2019-06-08 MED ORDER — NITROGLYCERIN 0.4 MG SL SUBL: 0.4000 mg | SUBLINGUAL_TABLET | SUBLINGUAL | Status: DC | PRN

## 2019-06-08 MED ORDER — TRANEXAMIC ACID-NACL 1000-0.7 MG/100ML-% IV SOLN
1000.0000 mg | INTRAVENOUS | Status: AC
  Administered 2019-06-08: 1000 mg via INTRAVENOUS
  Filled 2019-06-08: qty 100

## 2019-06-08 MED ORDER — ROCURONIUM BROMIDE 10 MG/ML (PF) SYRINGE
PREFILLED_SYRINGE | INTRAVENOUS | Status: AC
  Filled 2019-06-08: qty 10

## 2019-06-08 MED ORDER — FENTANYL CITRATE (PF) 100 MCG/2ML IJ SOLN
INTRAMUSCULAR | Status: AC
  Filled 2019-06-08: qty 4

## 2019-06-08 MED ORDER — DEXAMETHASONE SODIUM PHOSPHATE 10 MG/ML IJ SOLN
INTRAMUSCULAR | Status: AC
  Filled 2019-06-08: qty 1

## 2019-06-08 MED ORDER — LIDOCAINE 2% (20 MG/ML) 5 ML SYRINGE
INTRAMUSCULAR | Status: AC
  Filled 2019-06-08: qty 5

## 2019-06-08 MED ORDER — HYDROMORPHONE HCL 1 MG/ML IJ SOLN: 1.0000 mg | Freq: Once | INTRAMUSCULAR | Status: DC

## 2019-06-08 MED ORDER — BUPIVACAINE HCL 0.25 % IJ SOLN
INTRAMUSCULAR | Status: DC | PRN
  Administered 2019-06-08: 30 mL

## 2019-06-08 MED ORDER — SODIUM CHLORIDE 0.9 % IV SOLN: INTRAVENOUS | Status: DC

## 2019-06-08 MED ORDER — MIDAZOLAM HCL 2 MG/2ML IJ SOLN
INTRAMUSCULAR | Status: AC
  Filled 2019-06-08: qty 2

## 2019-06-08 MED ORDER — LIDOCAINE 2% (20 MG/ML) 5 ML SYRINGE
INTRAMUSCULAR | Status: DC | PRN
  Administered 2019-06-08: 80 mg via INTRAVENOUS

## 2019-06-08 MED ORDER — SUGAMMADEX SODIUM 500 MG/5ML IV SOLN
INTRAVENOUS | Status: AC
  Filled 2019-06-08: qty 5

## 2019-06-08 MED ORDER — METOCLOPRAMIDE HCL 5 MG/ML IJ SOLN: 5.0000 mg | Freq: Three times a day (TID) | INTRAMUSCULAR | Status: DC | PRN

## 2019-06-08 MED ORDER — METOCLOPRAMIDE HCL 5 MG PO TABS: 5.0000 mg | ORAL_TABLET | Freq: Three times a day (TID) | ORAL | Status: DC | PRN

## 2019-06-08 MED ORDER — FENTANYL CITRATE (PF) 100 MCG/2ML IJ SOLN
INTRAMUSCULAR | Status: DC | PRN
  Administered 2019-06-08: 50 ug via INTRAVENOUS
  Administered 2019-06-08 (×2): 100 ug via INTRAVENOUS
  Administered 2019-06-08: 50 ug via INTRAVENOUS

## 2019-06-08 MED ORDER — PANTOPRAZOLE SODIUM 40 MG PO TBEC
40.0000 mg | DELAYED_RELEASE_TABLET | Freq: Every day | ORAL | Status: DC
  Administered 2019-06-09: 40 mg via ORAL
  Filled 2019-06-08: qty 1

## 2019-06-08 MED ORDER — MIDAZOLAM HCL 5 MG/5ML IJ SOLN
INTRAMUSCULAR | Status: DC | PRN
  Administered 2019-06-08: 2 mg via INTRAVENOUS

## 2019-06-08 MED ORDER — LACTATED RINGERS IV SOLN: INTRAVENOUS | Status: DC

## 2019-06-08 MED ORDER — TRAMADOL HCL 50 MG PO TABS
50.0000 mg | ORAL_TABLET | Freq: Four times a day (QID) | ORAL | Status: DC | PRN
  Administered 2019-06-09 (×2): 50 mg via ORAL
  Filled 2019-06-08: qty 2

## 2019-06-08 MED ORDER — ONDANSETRON HCL 4 MG/2ML IJ SOLN
INTRAMUSCULAR | Status: DC | PRN
  Administered 2019-06-08: 4 mg via INTRAVENOUS

## 2019-06-08 MED ORDER — 0.9 % SODIUM CHLORIDE (POUR BTL) OPTIME
TOPICAL | Status: DC | PRN
  Administered 2019-06-08: 1000 mL

## 2019-06-08 MED ORDER — MORPHINE SULFATE (PF) 2 MG/ML IV SOLN: 0.5000 mg | INTRAVENOUS | Status: DC | PRN

## 2019-06-08 MED ORDER — METHOCARBAMOL 500 MG IVPB - SIMPLE MED
INTRAVENOUS | Status: AC
  Filled 2019-06-08: qty 50

## 2019-06-08 MED ORDER — CHLORHEXIDINE GLUCONATE 0.12 % MT SOLN
15.0000 mL | Freq: Once | OROMUCOSAL | Status: AC
  Administered 2019-06-08: 15 mL via OROMUCOSAL

## 2019-06-08 MED ORDER — CEFAZOLIN SODIUM-DEXTROSE 2-4 GM/100ML-% IV SOLN
2.0000 g | Freq: Four times a day (QID) | INTRAVENOUS | Status: AC
  Administered 2019-06-08 (×2): 2 g via INTRAVENOUS
  Filled 2019-06-08 (×2): qty 100

## 2019-06-08 MED ORDER — EZETIMIBE 10 MG PO TABS
10.0000 mg | ORAL_TABLET | Freq: Every day | ORAL | Status: DC
  Administered 2019-06-09: 10 mg via ORAL
  Filled 2019-06-08: qty 1

## 2019-06-08 MED ORDER — BUPIVACAINE HCL 0.25 % IJ SOLN
INTRAMUSCULAR | Status: AC
  Filled 2019-06-08: qty 1

## 2019-06-08 MED ORDER — PROPOFOL 1000 MG/100ML IV EMUL
INTRAVENOUS | Status: AC
  Filled 2019-06-08: qty 100

## 2019-06-08 MED ORDER — ONDANSETRON HCL 4 MG/2ML IJ SOLN
INTRAMUSCULAR | Status: AC
  Filled 2019-06-08: qty 2

## 2019-06-08 SURGICAL SUPPLY — 49 items
BAG DECANTER FOR FLEXI CONT (MISCELLANEOUS) IMPLANT
BAG ZIPLOCK 12X15 (MISCELLANEOUS) IMPLANT
BLADE SAG 18X100X1.27 (BLADE) ×3 IMPLANT
CLOSURE STERI-STRIP 1/2X4 (GAUZE/BANDAGES/DRESSINGS) ×2
CLOSURE WOUND 1/2 X4 (GAUZE/BANDAGES/DRESSINGS) ×1
CLSR STERI-STRIP ANTIMIC 1/2X4 (GAUZE/BANDAGES/DRESSINGS) ×4 IMPLANT
COVER PERINEAL POST (MISCELLANEOUS) ×3 IMPLANT
COVER SURGICAL LIGHT HANDLE (MISCELLANEOUS) ×3 IMPLANT
COVER WAND RF STERILE (DRAPES) ×3 IMPLANT
CUP ACETBLR 54 OD PINNACLE (Hips) ×3 IMPLANT
DECANTER SPIKE VIAL GLASS SM (MISCELLANEOUS) ×3 IMPLANT
DRAPE STERI IOBAN 125X83 (DRAPES) ×3 IMPLANT
DRAPE U-SHAPE 47X51 STRL (DRAPES) ×6 IMPLANT
DRESSING AQUACEL AG SP 3.5X10 (GAUZE/BANDAGES/DRESSINGS) ×1 IMPLANT
DRSG ADAPTIC 3X8 NADH LF (GAUZE/BANDAGES/DRESSINGS) ×3 IMPLANT
DRSG AQUACEL AG ADV 3.5X10 (GAUZE/BANDAGES/DRESSINGS) ×3 IMPLANT
DRSG AQUACEL AG SP 3.5X10 (GAUZE/BANDAGES/DRESSINGS) ×3
DURAPREP 26ML APPLICATOR (WOUND CARE) ×3 IMPLANT
ELECT REM PT RETURN 15FT ADLT (MISCELLANEOUS) ×3 IMPLANT
EVACUATOR 1/8 PVC DRAIN (DRAIN) IMPLANT
GLOVE BIO SURGEON STRL SZ 6 (GLOVE) IMPLANT
GLOVE BIO SURGEON STRL SZ7 (GLOVE) IMPLANT
GLOVE BIO SURGEON STRL SZ8 (GLOVE) ×3 IMPLANT
GLOVE BIOGEL PI IND STRL 6.5 (GLOVE) IMPLANT
GLOVE BIOGEL PI IND STRL 7.0 (GLOVE) IMPLANT
GLOVE BIOGEL PI IND STRL 8 (GLOVE) ×1 IMPLANT
GLOVE BIOGEL PI INDICATOR 6.5 (GLOVE)
GLOVE BIOGEL PI INDICATOR 7.0 (GLOVE)
GLOVE BIOGEL PI INDICATOR 8 (GLOVE) ×2
GOWN STRL REUS W/TWL LRG LVL3 (GOWN DISPOSABLE) ×3 IMPLANT
GOWN STRL REUS W/TWL XL LVL3 (GOWN DISPOSABLE) IMPLANT
HEAD CERAMIC DELTA 36 PLUS 1.5 (Hips) ×3 IMPLANT
HOLDER FOLEY CATH W/STRAP (MISCELLANEOUS) ×3 IMPLANT
KIT TURNOVER KIT A (KITS) IMPLANT
LINER MARATHON NEUT +4X54X36 (Hips) ×3 IMPLANT
MANIFOLD NEPTUNE II (INSTRUMENTS) ×3 IMPLANT
PACK ANTERIOR HIP CUSTOM (KITS) ×3 IMPLANT
PENCIL SMOKE EVACUATOR COATED (MISCELLANEOUS) ×3 IMPLANT
STEM FEM ACTIS HIGH SZ7 (Stem) ×3 IMPLANT
STRIP CLOSURE SKIN 1/2X4 (GAUZE/BANDAGES/DRESSINGS) ×2 IMPLANT
SUT ETHIBOND NAB CT1 #1 30IN (SUTURE) ×3 IMPLANT
SUT MNCRL AB 4-0 PS2 18 (SUTURE) ×3 IMPLANT
SUT STRATAFIX 0 PDS 27 VIOLET (SUTURE) ×3
SUT VIC AB 2-0 CT1 27 (SUTURE) ×6
SUT VIC AB 2-0 CT1 TAPERPNT 27 (SUTURE) ×2 IMPLANT
SUTURE STRATFX 0 PDS 27 VIOLET (SUTURE) ×1 IMPLANT
SYR 50ML LL SCALE MARK (SYRINGE) IMPLANT
TRAY FOLEY MTR SLVR 16FR STAT (SET/KITS/TRAYS/PACK) ×3 IMPLANT
YANKAUER SUCT BULB TIP 10FT TU (MISCELLANEOUS) ×3 IMPLANT

## 2019-06-08 NOTE — Discharge Instructions (Signed)
Stephen Aluisio, MD Total Joint Specialist [facility] [address] [place], [place] [address] [phone]  ANTERIOR APPROACH TOTAL HIP REPLACEMENT POSTOPERATIVE DIRECTIONS     Hip Rehabilitation, Guidelines Following Surgery  The results of a hip operation are greatly improved after range of motion and muscle strengthening exercises. Follow all safety measures which are given to protect your hip. If any of these exercises cause increased pain or swelling in your joint, decrease the amount until you are comfortable again. Then slowly increase the exercises. Call your caregiver if you have problems or questions.   HOME CARE INSTRUCTIONS  . Remove items at home which could result in a fall. This includes throw rugs or furniture in walking pathways.   ICE to the affected hip as frequently as 20-30 minutes an hour and then as needed for pain and swelling. Continue to use ice on the hip for pain and swelling from surgery. You may notice swelling that will progress down to the foot and ankle. This is normal after surgery. Elevate the leg when you are not up walking on it.    Continue to use the breathing machine which will help keep your temperature down.  It is common for your temperature to cycle up and down following surgery, especially at night when you are not up moving around and exerting yourself.  The breathing machine keeps your lungs expanded and your temperature down.  DIET You may resume your previous home diet once your are discharged from the hospital.  DRESSING / WOUND CARE / SHOWERING . You have an adhesive waterproof bandage over the incision. Leave this in place until your first follow-up appointment. Once you remove this you will not need to place another bandage.  . You may begin showering 3 days following surgery, but do not submerge the incision under water.  ACTIVITY . For the first 3-5 days, it is important to rest and keep the operative  leg elevated. You should, as a general rule, rest for 50 minutes and walk/stretch for 10 minutes per hour. After 5 days, you may slowly increase activity as tolerated.  . Perform the exercises you were provided twice a day for about 15-20 minutes each session. Begin these 2 days following surgery. . Walk with your walker as instructed. Use the walker until you are comfortable transitioning to a cane. Walk with the cane in the opposite hand of the operative leg. You may discontinue the cane once you are comfortable and walking steadily. . Avoid periods of inactivity such as sitting longer than an hour when not asleep. This helps prevent blood clots.  . Do not drive a car for 6 weeks or until released by your surgeon.  . Do not drive while taking narcotics.  TED HOSE STOCKINGS Wear the elastic stockings on both legs for three weeks following surgery during the day. You may remove them at night while sleeping.  WEIGHT BEARING Weight bearing as tolerated with assist device (walker, cane, etc) as directed, use it as long as suggested by your surgeon or therapist, typically at least 4-6 weeks.  POSTOPERATIVE CONSTIPATION PROTOCOL Constipation - defined medically as fewer than three stools per week and severe constipation as less than one stool per week.  One of the most common issues patients have following surgery is constipation.  Even if you have a regular bowel pattern at home, your normal regimen is likely to be disrupted due to multiple reasons following surgery.  Combination of anesthesia, postoperative narcotics,   change in appetite and fluid intake all can affect your bowels.  In order to avoid complications following surgery, here are some recommendations in order to help you during your recovery period.  . Colace (docusate) - Pick up an over-the-counter form of Colace or another stool softener and take twice a day as long as you are requiring postoperative pain medications.  Take with a full  glass of water daily.  If you experience loose stools or diarrhea, hold the colace until you stool forms back up.  If your symptoms do not get better within 1 week or if they get worse, check with your doctor. . Dulcolax (bisacodyl) - Pick up over-the-counter and take as directed by the product packaging as needed to assist with the movement of your bowels.  Take with a full glass of water.  Use this product as needed if not relieved by Colace only.  . MiraLax (polyethylene glycol) - Pick up over-the-counter to have on hand.  MiraLax is a solution that will increase the amount of water in your bowels to assist with bowel movements.  Take as directed and can mix with a glass of water, juice, soda, coffee, or tea.  Take if you go more than two days without a movement.Do not use MiraLax more than once per day. Call your doctor if you are still constipated or irregular after using this medication for 7 days in a row.  If you continue to have problems with postoperative constipation, please contact the office for further assistance and recommendations.  If you experience "the worst abdominal pain ever" or develop nausea or vomiting, please contact the office immediatly for further recommendations for treatment.  ITCHING  If you experience itching with your medications, try taking only a single pain pill, or even half a pain pill at a time.  You can also use Benadryl over the counter for itching or also to help with sleep.   MEDICATIONS See your medication summary on the "After Visit Summary" that the nursing staff will review with you prior to discharge.  You may have some home medications which will be placed on hold until you complete the course of blood thinner medication.  It is important for you to complete the blood thinner medication as prescribed by your surgeon.  Continue your approved medications as instructed at time of discharge.  PRECAUTIONS If you experience chest pain or shortness of breath -  call 911 immediately for transfer to the hospital emergency department.  If you develop a fever greater that 101 F, purulent drainage from wound, increased redness or drainage from wound, foul odor from the wound/dressing, or calf pain - CONTACT YOUR SURGEON.                                                   FOLLOW-UP APPOINTMENTS Make sure you keep all of your appointments after your operation with your surgeon and caregivers. You should call the office at the above phone number and make an appointment for approximately two weeks after the date of your surgery or on the date instructed by your surgeon outlined in the "After Visit Summary".  RANGE OF MOTION AND STRENGTHENING EXERCISES  These exercises are designed to help you keep full movement of your hip joint. Follow your caregiver's or physical therapist's instructions. Perform all exercises about fifteen times, three   times per day or as directed. Exercise both hips, even if you have had only one joint replacement. These exercises can be done on a training (exercise) mat, on the floor, on a table or on a bed. Use whatever works the best and is most comfortable for you. Use music or television while you are exercising so that the exercises are a pleasant break in your day. This will make your life better with the exercises acting as a break in routine you can look forward to.  . Lying on your back, slowly slide your foot toward your buttocks, raising your knee up off the floor. Then slowly slide your foot back down until your leg is straight again.  . Lying on your back spread your legs as far apart as you can without causing discomfort.  . Lying on your side, raise your upper leg and foot straight up from the floor as far as is comfortable. Slowly lower the leg and repeat.  . Lying on your back, tighten up the muscle in the front of your thigh (quadriceps muscles). You can do this by keeping your leg straight and trying to raise your heel off the  floor. This helps strengthen the largest muscle supporting your knee.  . Lying on your back, tighten up the muscles of your buttocks both with the legs straight and with the knee bent at a comfortable angle while keeping your heel on the floor.   IF YOU ARE TRANSFERRED TO A SKILLED REHAB FACILITY If the patient is transferred to a skilled rehab facility following release from the hospital, a list of the current medications will be sent to the facility for the patient to continue.  When discharged from the skilled rehab facility, please have the facility set up the patient's Home Health Physical Therapy prior to being released. Also, the skilled facility will be responsible for providing the patient with their medications at time of release from the facility to include their pain medication, the muscle relaxants, and their blood thinner medication. If the patient is still at the rehab facility at time of the two week follow up appointment, the skilled rehab facility will also need to assist the patient in arranging follow up appointment in our office and any transportation needs.  MAKE SURE YOU:  . Understand these instructions.  . Get help right away if you are not doing well or get worse.    DENTAL ANTIBIOTICS:  In most cases prophylactic antibiotics for Dental procdeures after total joint surgery are not necessary.  Exceptions are as follows:  1. History of prior total joint infection  2. Severely immunocompromised (Organ Transplant, cancer chemotherapy, Rheumatoid biologic meds such as Humera)  3. Poorly controlled diabetes (A1C &gt; 8.0, blood glucose over 200)  If you have one of these conditions, contact your surgeon for an antibiotic prescription, prior to your dental procedure.    Pick up stool softner and laxative for home use following surgery while on pain medications. Do not submerge incision under water. Please use good hand washing techniques while changing dressing each  day. May shower starting three days after surgery. Please use a clean towel to pat the incision dry following showers. Continue to use ice for pain and swelling after surgery. Do not use any lotions or creams on the incision until instructed by your surgeon.  

## 2019-06-08 NOTE — Transfer of Care (Signed)
Immediate Anesthesia Transfer of Care Note  Patient: Stephen Medina  Procedure(s) Performed: TOTAL HIP ARTHROPLASTY ANTERIOR APPROACH (Right Hip)  Patient Location: PACU  Anesthesia Type:General  Level of Consciousness: awake, alert , oriented and patient cooperative  Airway & Oxygen Therapy: Patient Spontanous Breathing and Patient connected to face mask oxygen  Post-op Assessment: Report given to RN, Post -op Vital signs reviewed and stable and Patient moving all extremities  Post vital signs: Reviewed and stable  Last Vitals:  Vitals Value Taken Time  BP    Temp    Pulse 58 06/08/19 1122  Resp 15 06/08/19 1122  SpO2 100 % 06/08/19 1122  Vitals shown include unvalidated device data.  Last Pain:  Vitals:   06/08/19 0853  TempSrc:   PainSc: 0-No pain      Patients Stated Pain Goal: 3 ([id number])  Complications: No apparent anesthesia complications

## 2019-06-08 NOTE — Interval H&P Note (Signed)
History and Physical Interval Note:  06/08/2019 8:26 AM  Stephen Medina  has presented today for surgery, with the diagnosis of right hip osteoarthritis.  The various methods of treatment have been discussed with the patient and family. After consideration of risks, benefits and other options for treatment, the patient has consented to  Procedure(s) with comments: [place] (Right) - 148min as a surgical intervention.  The patient's history has been reviewed, patient examined, no change in status, stable for surgery.  I have reviewed the patient's chart and labs.  Questions were answered to the patient's satisfaction.     Pilar Plate [name]

## 2019-06-08 NOTE — Anesthesia Procedure Notes (Signed)
Procedure Name: Intubation Date/Time: 06/08/2019 9:40 AM Performed by: Victoriano Lain, CRNA Pre-anesthesia Checklist: Patient identified, Emergency Drugs available, Suction available, Patient being monitored and Timeout performed Patient Re-evaluated:Patient Re-evaluated prior to induction Oxygen Delivery Method: Circle system utilized Preoxygenation: Pre-oxygenation with 100% oxygen Ventilation: Oral airway inserted - appropriate to patient size and Mask ventilation without difficulty Laryngoscope Size: Mac and 4 Grade View: Grade I Tube type: Oral Tube size: 7.5 mm Number of attempts: 1 Airway Equipment and Method: Stylet Placement Confirmation: ETT inserted through vocal cords under direct vision,  positive ETCO2 and breath sounds checked- equal and bilateral Secured at: 22 cm Tube secured with: Tape Dental Injury: Teeth and Oropharynx as per pre-operative assessment

## 2019-06-08 NOTE — Op Note (Signed)
OPERATIVE REPORT- TOTAL HIP ARTHROPLASTY   PREOPERATIVE DIAGNOSIS: Osteoarthritis of the Right hip.   POSTOPERATIVE DIAGNOSIS: Osteoarthritis of the Right  hip.   PROCEDURE: Right total hip arthroplasty, anterior approach.   SURGEON: Gaynelle Arabian, MD   ASSISTANT: Griffith Citron, PA-C  ANESTHESIA:  Spinal  ESTIMATED BLOOD LOSS:-400 mL    DRAINS: Hemovac x1.   COMPLICATIONS: None   CONDITION: PACU - hemodynamically stable.   BRIEF CLINICAL NOTE: Stephen Medina is a 69 y.o. male who has advanced end-  stage arthritis of their Right  hip with progressively worsening pain and  dysfunction.The patient has failed nonoperative management and presents for  total hip arthroplasty.   PROCEDURE IN DETAIL: After successful administration of spinal  anesthetic, the traction boots for the [facility] bed were placed on both  feet and the patient was placed onto the [facility] bed, boots placed into the leg  holders. The Right hip was then isolated from the perineum with plastic  drapes and prepped and draped in the usual sterile fashion. ASIS and  greater trochanter were marked and a oblique incision was made, starting  at about 1 cm lateral and 2 cm distal to the ASIS and coursing towards  the anterior cortex of the femur. The skin was cut with a 10 blade  through subcutaneous tissue to the level of the fascia overlying the  tensor fascia lata muscle. The fascia was then incised in line with the  incision at the junction of the anterior third and posterior 2/3rd. The  muscle was teased off the fascia and then the interval between the TFL  and the rectus was developed. The Hohmann retractor was then placed at  the top of the femoral neck over the capsule. The vessels overlying the  capsule were cauterized and the fat on top of the capsule was removed.  A Hohmann retractor was then placed anterior underneath the rectus  femoris to give exposure to the entire anterior capsule. A T-shaped   capsulotomy was performed. The edges were tagged and the femoral head  was identified.       Osteophytes are removed off the superior acetabulum.  The femoral neck was then cut in situ with an oscillating saw. Traction  was then applied to the left lower extremity utilizing the [facility]  traction. The femoral head was then removed. Retractors were placed  around the acetabulum and then circumferential removal of the labrum was  performed. Osteophytes were also removed. Reaming starts at 49 mm to  medialize and  Increased in 2 mm increments to 53 mm. We reamed in  approximately 40 degrees of abduction, 20 degrees anteversion. A 54 mm  pinnacle acetabular shell was then impacted in anatomic position under  fluoroscopic guidance with excellent purchase. We did not need to place  any additional dome screws. A 36 mm neutral + 4 marathon liner was then  placed into the acetabular shell.       The femoral lift was then placed along the lateral aspect of the femur  just distal to the vastus ridge. The leg was  externally rotated and capsule  was stripped off the inferior aspect of the femoral neck down to the  level of the lesser trochanter, this was done with electrocautery. The femur was lifted after this was performed. The  leg was then placed in an extended and adducted position essentially delivering the femur. We also removed the capsule superiorly and the piriformis from the piriformis  fossa to gain excellent exposure of the  proximal femur. Rongeur was used to remove some cancellous bone to get  into the lateral portion of the proximal femur for placement of the  initial starter reamer. The starter broaches was placed  the starter broach  and was shown to go down the center of the canal. Broaching  with the Actis system was then performed starting at size 0  coursing  Up to size 7. A size 7 had excellent torsional and rotational  and axial stability. The trial high offset neck was then placed   with a 36 + 1.5 trial head. The hip was then reduced. We confirmed that  the stem was in the canal both on AP and lateral x-rays. It also has excellent sizing. The hip was reduced with outstanding stability through full extension and full external rotation.. AP pelvis was taken and the leg lengths were measured and found to be equal. Hip was then dislocated again and the femoral head and neck removed. The  femoral broach was removed. Size 7 Actis stem with a high offset  neck was then impacted into the femur following native anteversion. Has  excellent purchase in the canal. Excellent torsional and rotational and  axial stability. It is confirmed to be in the canal on AP and lateral  fluoroscopic views. The 36 + 1.5 ceramic head was placed and the hip  reduced with outstanding stability. Again AP pelvis was taken and it  confirmed that the leg lengths were equal. The wound was then copiously  irrigated with saline solution and the capsule reattached and repaired  with Ethibond suture. 30 ml of .25% Bupivicaine was  injected into the capsule and into the edge of the tensor fascia lata as well as subcutaneous tissue. The fascia overlying the tensor fascia lata was then closed with a running #1 V-Loc. Subcu was closed with interrupted 2-0 Vicryl and subcuticular running 4-0 Monocryl. Incision was cleaned  and dried. Steri-Strips and a bulky sterile dressing applied. Hemovac  drain was hooked to suction and then the patient was awakened and transported to  recovery in stable condition.        Please note that a surgical assistant was a medical necessity for this procedure to perform it in a safe and expeditious manner. Assistant was necessary to provide appropriate retraction of vital neurovascular structures and to prevent femoral fracture and allow for anatomic placement of the prosthesis.  Gaynelle Arabian, M.D.

## 2019-06-08 NOTE — Anesthesia Postprocedure Evaluation (Signed)
Anesthesia Post Note  Patient: Stephen Medina  Procedure(s) Performed: TOTAL HIP ARTHROPLASTY ANTERIOR APPROACH (Right Hip)     Patient location during evaluation: PACU Anesthesia Type: General Level of consciousness: awake and alert Pain management: pain level controlled Vital Signs Assessment: post-procedure vital signs reviewed and stable Respiratory status: spontaneous breathing, nonlabored ventilation and respiratory function stable Cardiovascular status: blood pressure returned to baseline and stable Postop Assessment: no apparent nausea or vomiting Anesthetic complications: no    Last Vitals:  Vitals:   06/08/19 1555 06/08/19 1729  BP: (!) 145/84 100/73  Pulse: (!) 59 67  Resp: 17 17  Temp: 36.6 C 36.6 C  SpO2: 99% 98%    Last Pain:  Vitals:   06/08/19 1729  TempSrc: Oral  PainSc:                  Catalina Gravel

## 2019-06-08 NOTE — Evaluation (Signed)
Physical Therapy Evaluation Patient Details Name: Stephen Medina MRN: [id number] DOB: 08/11/1950 Today's Date: 06/08/2019   History of Present Illness  Patient is 69 y.o. male s/p Rt THA anterior approach on 06/08/19 with PMH significant for obesity, HTN, HLD, GERD, CAD, neuropathy, Lumbar lam/decompression L5-S1, cardiac cath (1 stent).    Clinical Impression  Stephen Medina is a 69 y.o. male POD 0 s/p Rt THA. Patient reports modified independence with use of two SPC's for mobility PTA. Patient is now limited by functional impairments (see PT problem list below) and requires min assist for transfers with RW. Patient was able to ambulate ~4 feet with RW and min assist to manage walker and complete small steps to bedside recliner. Patient instructed in exercise to facilitate ROM and circulation. Patient will benefit from continued skilled PT interventions to address impairments and progress towards PLOF. Acute PT will follow to progress mobility and stair training in preparation for safe discharge home.     Follow Up Recommendations Follow surgeon's recommendation for DC plan and follow-up therapies;Home health PT    Equipment Recommendations  None recommended by PT    Recommendations for Other Services       Precautions / Restrictions Restrictions Weight Bearing Restrictions: No      Mobility  Bed Mobility Overal bed mobility: Needs Assistance Bed Mobility: Supine to Sit     Supine to sit: Min assist;HOB elevated     General bed mobility comments: cues for using bed rail, assist to mobilize Rt LE dur to hip and knee pain.   Transfers Overall transfer level: Needs assistance Equipment used: Rolling walker (2 wheeled) Transfers: Sit to/from [facility] Sit to Stand: Min assist;From elevated surface Stand pivot transfers: Min assist;From elevated surface       General transfer comment: pt required elevated surface to rise. cues for safe hand  placement/technique with RW. assist to steady in standing. Pt limited by fatigue and pain in standing. cues for small side steps at EOB to mobilize to recliner. assist required for walker positioning.   Ambulation/Gait Ambulation/Gait assistance: Min assist Gait Distance (Feet): 4 Feet Assistive device: Rolling walker (2 wheeled) Gait Pattern/deviations: Step-to pattern;Shuffle;Trunk flexed;Antalgic;Decreased stance time - right;Decreased step length - right;Decreased step length - left Gait velocity: decreased and labored Gait velocity interpretation: <1.31 ft/sec, indicative of household ambulator General Gait Details: pt tooksmall side steps to move to recliner assist needed for walker positioning and cues for step sequencing.   Stairs            Wheelchair Mobility    Modified Rankin (Stroke Patients Only)       Balance Overall balance assessment: Needs assistance Sitting-balance support: Feet supported Sitting balance-Leahy Scale: Fair     Standing balance support: During functional activity;Bilateral upper extremity supported Standing balance-Leahy Scale: Poor Standing balance comment: pt heavily reliant on UE support              Pertinent Vitals/Pain Pain Assessment: 0-10 Pain Score: 9  Pain Location: Rt hip Pain Descriptors / Indicators: Aching;Discomfort;Sore Pain Intervention(s): Monitored during session;Limited activity within patient's tolerance;Repositioned;Ice applied    Home Living Family/patient expects to be discharged to:: Private residence Living Arrangements: Spouse/significant other Available Help at Discharge: Family Type of Home: House Home Access: Stairs to enter Entrance Stairs-Rails: None Entrance Stairs-Number of Steps: 1 Home Layout: One level Home Equipment: Environmental consultant - 2 wheels;Cane - single point;Shower seat;Grab bars - tub/shower;Bedside commode      Prior Function Level of  Independence: Independent with assistive device(s)          Comments: using 2 SPC's to get around     Hand Dominance   Dominant Hand: Right    Extremity/Trunk Assessment   Upper Extremity Assessment Upper Extremity Assessment: Overall WFL for tasks assessed    Lower Extremity Assessment Lower Extremity Assessment: Generalized weakness    Cervical / Trunk Assessment Cervical / Trunk Assessment: Kyphotic;Other exceptions Cervical / Trunk Exceptions: large habitus, pt unable to obtain upright posture, hx of lumbar surgery  Communication   Communication: No difficulties  Cognition Arousal/Alertness: Awake/alert Behavior During Therapy: WFL for tasks assessed/performed Overall Cognitive Status: Within Functional Limits for tasks assessed               General Comments      Exercises Total Joint Exercises Ankle Circles/Pumps: AROM;Both;Seated;15 reps   Assessment/Plan    PT Assessment Patient needs continued PT services  PT Problem List Decreased strength;Decreased range of motion;Decreased activity tolerance;Decreased balance;Decreased mobility;Decreased knowledge of use of DME       PT Treatment Interventions DME instruction;Gait training;Stair training;Functional mobility training;Therapeutic activities;Therapeutic exercise;Balance training;Patient/family education    PT Goals (Current goals can be found in the Care Plan section)  Acute Rehab PT Goals Patient Stated Goal: get home and have less pain PT Goal Formulation: With patient Time For Goal Achievement: 06/15/19 Potential to Achieve Goals: Good    Frequency 7X/week   Barriers to discharge       AM-PAC PT "6 Clicks" Mobility  Outcome Measure Help needed turning from your back to your side while in a flat bed without using bedrails?: A Little Help needed moving from lying on your back to sitting on the side of a flat bed without using bedrails?: A Little Help needed moving to and from a bed to a chair (including a wheelchair)?: A Little Help needed  standing up from a chair using your arms (e.g., wheelchair or bedside chair)?: A Little Help needed to walk in hospital room?: A Lot Help needed climbing 3-5 steps with a railing? : A Lot 6 Click Score: 16    End of Session Equipment Utilized During Treatment: Gait belt Activity Tolerance: Patient limited by pain Patient left: in chair;with call bell/phone within reach;with chair alarm set;with family/visitor present Nurse Communication: Mobility status PT Visit Diagnosis: Muscle weakness (generalized) (M62.81);Difficulty in walking, not elsewhere classified (R26.2)    Time: [id number] PT Time Calculation (min) (ACUTE ONLY): 23 min   Charges:   PT Evaluation $PT Eval Low Complexity: 1 Low PT Treatments $Therapeutic Activity: 8-22 mins        Verner Mould, DPT Physical Therapist with [facility] [phone]  06/08/2019 4:52 PM

## 2019-06-08 NOTE — Progress Notes (Signed)
Orthopedic Tech Progress Note Patient Details:  Stephen Medina 1950/02/24 [id number]  Ortho Devices Ortho Device/Splint Location: Trapeze bar Ortho Device/Splint Interventions: Application   Post Interventions Patient Tolerated: Well Instructions Provided: Care of device   [name] 06/08/2019, 1:55 PM

## 2019-06-09 ENCOUNTER — Encounter: Payer: Self-pay | Admitting: *Deleted

## 2019-06-09 DIAGNOSIS — M1611 Unilateral primary osteoarthritis, right hip: Secondary | ICD-10-CM | POA: Diagnosis not present

## 2019-06-09 LAB — CBC
HCT: 36.5 % — ABNORMAL LOW (ref 39.0–52.0)
Hemoglobin: 11.7 g/dL — ABNORMAL LOW (ref 13.0–17.0)
MCH: 29.7 pg (ref 26.0–34.0)
MCHC: 32.1 g/dL (ref 30.0–36.0)
MCV: 92.6 fL (ref 80.0–100.0)
Platelets: 181 10*3/uL (ref 150–400)
RBC: 3.94 MIL/uL — ABNORMAL LOW (ref 4.22–5.81)
RDW: 13.4 % (ref 11.5–15.5)
WBC: 10.8 10*3/uL — ABNORMAL HIGH (ref 4.0–10.5)
nRBC: 0 % (ref 0.0–0.2)

## 2019-06-09 LAB — BASIC METABOLIC PANEL
Anion gap: 10 (ref 5–15)
BUN: 12 mg/dL (ref 8–23)
CO2: 22 mmol/L (ref 22–32)
Calcium: 8.4 mg/dL — ABNORMAL LOW (ref 8.9–10.3)
Chloride: 103 mmol/L (ref 98–111)
Creatinine, Ser: 0.82 mg/dL (ref 0.61–1.24)
GFR calc Af Amer: >60 mL/min (ref >60)
GFR calc non Af Amer: >60 mL/min (ref >60)
Glucose, Bld: 184 mg/dL — ABNORMAL HIGH (ref 70–99)
Potassium: 4.2 mmol/L (ref 3.5–5.1)
Sodium: 135 mmol/L (ref 135–145)

## 2019-06-09 MED ORDER — METHOCARBAMOL 500 MG PO TABS: 500.0000 mg | ORAL_TABLET | Freq: Four times a day (QID) | ORAL | 0 refills | Status: DC | PRN

## 2019-06-09 MED ORDER — HYDROCODONE-ACETAMINOPHEN 5-325 MG PO TABS: 1.0000 | ORAL_TABLET | Freq: Four times a day (QID) | ORAL | 0 refills | Status: DC | PRN

## 2019-06-09 MED ORDER — TRAMADOL HCL 50 MG PO TABS: 50.0000 mg | ORAL_TABLET | Freq: Four times a day (QID) | ORAL | 0 refills | Status: DC | PRN

## 2019-06-09 NOTE — Progress Notes (Signed)
   Subjective: 1 Day Post-Op Procedure(s) (LRB): TOTAL HIP ARTHROPLASTY ANTERIOR APPROACH (Right) Patient reports pain as moderate.   Patient seen in rounds for Dr. Wynelle Link. Patient is well, and has had no acute complaints or problems other than pain in the right thigh. No issues overnight other than increased levels of pain. Denies chest pain or SOB. Voiding without difficulty. We will continue therapy today.   Objective: Vital signs in last 24 hours: Temp:  [97.4 F (36.3 C)-98.1 F (36.7 C)] 98 F (36.7 C) (05/27 0555) Pulse Rate:  [51-67] 57 (05/27 0555) Resp:  [12-20] 17 (05/27 0555) BP: (100-157)/(48-97) 111/58 (05/27 0555) SpO2:  [96 %-100 %] 96 % (05/27 0555) Weight:  [133.4 kg] 133.4 kg (05/26 0853)  Intake/Output from previous day:  Intake/Output Summary (Last 24 hours) at 06/09/2019 0818 Last data filed at 06/09/2019 0600 Gross per 24 hour  Intake 4045.34 ml  Output 1910 ml  Net 2135.34 ml     Intake/Output this shift: No intake/output data recorded.  Labs: Recent Labs    06/09/19 0346  HGB 11.7*   Recent Labs    06/09/19 0346  WBC 10.8*  RBC 3.94*  HCT 36.5*  PLT 181   Recent Labs    06/09/19 0346  NA 135  K 4.2  CL 103  CO2 22  BUN 12  CREATININE 0.82  GLUCOSE 184*  CALCIUM 8.4*   No results for input(s): LABPT, INR in the last 72 hours.  Exam: General - Patient is Alert and Oriented Extremity - Neurologically intact Neurovascular intact Sensation intact distally Dorsiflexion/Plantar flexion intact Dressing - dressing C/D/I Motor Function - intact, moving foot and toes well on exam.   Past Medical History:  Diagnosis Date  . CAD (coronary artery disease) 08/01/2013   Cath 2005  normal Left main, scattered irregularities LAD, 80% stenosis ostial Diag 1, scattered irregularities CFX, scattered irregularities RCA  Taxus stent March 2005 Diagonal   . Disc displacement, lumbar   . Foot ulceration ([place])    left  . GERD  (gastroesophageal reflux disease)   . History of ulcer disease 10/19/2013  . Hyperlipidemia   . Hypertension   . Lumbar spondylosis 04/01/2013  . Morbid obesity ([place])   . Neuromuscular disorder ([facility])    neuropathy left foot   . Wears glasses     Assessment/Plan: 1 Day Post-Op Procedure(s) (LRB): TOTAL HIP ARTHROPLASTY ANTERIOR APPROACH (Right) Principal Problem:   OA (osteoarthritis) of hip Active Problems:   Primary osteoarthritis of right hip  Estimated body mass index is 39.89 kg/m as calculated from the following:   Height as of this encounter: 6' (1.829 m).   Weight as of this encounter: 133.4 kg. Advance diet Up with therapy D/C IV fluids  DVT Prophylaxis - Aspirin and Plavix Weight bearing as tolerated. Continue therapy.  Plan is to go Home with HEP after hospital stay. Plan for discharge later today if progresses with therapy and is meeting his goals. Follow-up in the office on June 8th.  Theresa Duty, PA-C Orthopedic Surgery [phone] 06/09/2019, 8:18 AM

## 2019-06-09 NOTE — Plan of Care (Signed)
Patient discharged home. He is waiting on his ride

## 2019-06-09 NOTE — TOC Transition Note (Signed)
Transition of Care [facility]) - CM/SW Discharge Note   Patient Details  Name: Stephen Medina MRN: [id number] Date of Birth: [birth year]  [facility] CM/SW Contact:  Lia Hopping, [place] Phone Number: 06/09/2019, 9:57 AM   Clinical Narrative:    Therapy Plan: OPPT-EO CSW confirm the patient has DME-RW and high toilet seats.    Final next level of care: OP Rehab Barriers to Discharge: No Barriers Identified   Patient Goals and CMS Choice     Choice offered to / list presented to : NA  Discharge Placement                       Discharge Plan and Services                  DME Agency: NA         HH Agency: NA        Social Determinants of Health (SDOH) Interventions     Readmission Risk Interventions No flowsheet data found.

## 2019-06-09 NOTE — Progress Notes (Signed)
Physical Therapy Treatment Patient Details Name: Stephen Medina MRN: [id number] DOB: [birth year] Today's Date: 06/09/2019    History of Present Illness Patient is 69 y.o. male s/p Rt THA anterior approach on 06/08/19 with PMH significant for obesity, HTN, HLD, GERD, CAD, neuropathy, Lumbar lam/decompression L5-S1, cardiac cath (1 stent). Pt reports he had a rod placed in L tibia in Feb 2021.    PT Comments    Pt ambulated 40' with RW, distance limited by R hip pain and BUE fatigue 2* heavy reliance upon support on RW. Instructed pt in HEP. Will return for second session for stair training later today. Pt puts forth good effort.   Follow Up Recommendations  Follow surgeon's recommendation for DC plan and follow-up therapies;Home health PT     Equipment Recommendations  None recommended by PT    Recommendations for Other Services       Precautions / Restrictions Precautions Precautions: Fall Restrictions Weight Bearing Restrictions: No    Mobility  Bed Mobility Overal bed mobility: Needs Assistance Bed Mobility: Supine to Sit     Supine to sit: HOB elevated;Min assist     General bed mobility comments: min A to pivot hips using pad, used rail  Transfers Overall transfer level: Needs assistance Equipment used: Rolling walker (2 wheeled) Transfers: Sit to/from Stand Sit to Stand: Min assist;From elevated surface         General transfer comment: good hand placement, increased time, min A to power up  Ambulation/Gait Ambulation/Gait assistance: Min guard Gait Distance (Feet): 70 Feet Assistive device: Rolling walker (2 wheeled) Gait Pattern/deviations: Step-to pattern;Shuffle;Trunk flexed;Antalgic;Decreased stance time - right;Decreased step length - right;Decreased step length - left Gait velocity: decreased and labored   General Gait Details: VCs for posture, distance limited by BUE fatigue and R hip pain   Stairs             Wheelchair Mobility     Modified Rankin (Stroke Patients Only)       Balance Overall balance assessment: Needs assistance Sitting-balance support: Feet supported Sitting balance-Leahy Scale: Fair     Standing balance support: During functional activity;Bilateral upper extremity supported Standing balance-Leahy Scale: Poor Standing balance comment: pt heavily reliant on UE support                            Cognition Arousal/Alertness: Awake/alert Behavior During Therapy: WFL for tasks assessed/performed Overall Cognitive Status: Within Functional Limits for tasks assessed                                        Exercises Total Joint Exercises Ankle Circles/Pumps: AROM;Both;Seated;15 reps Quad Sets: AROM;Right;5 reps;Supine Short Arc Quad: AROM;Right;5 reps;Supine Heel Slides: AAROM;Right;5 reps;Supine Hip ABduction/ADduction: AAROM;Right;5 reps;Supine    General Comments        Pertinent Vitals/Pain Pain Score: 7  Pain Location: Rt hip Pain Descriptors / Indicators: Aching;Discomfort;Sore Pain Intervention(s): Limited activity within patient's tolerance;Monitored during session;Premedicated before session;Patient requesting pain meds-RN notified;RN gave pain meds during session;Ice applied    Home Living                      Prior Function            PT Goals (current goals can now be found in the care plan section) Acute Rehab PT Goals Patient Stated Goal: get  home and have less pain PT Goal Formulation: With patient Time For Goal Achievement: 06/15/19 Potential to Achieve Goals: Good Progress towards PT goals: Progressing toward goals    Frequency    7X/week      PT Plan Current plan remains appropriate    Co-evaluation              AM-PAC PT "6 Clicks" Mobility   Outcome Measure  Help needed turning from your back to your side while in a flat bed without using bedrails?: A Little Help needed moving from lying on your back  to sitting on the side of a flat bed without using bedrails?: A Little Help needed moving to and from a bed to a chair (including a wheelchair)?: A Little Help needed standing up from a chair using your arms (e.g., wheelchair or bedside chair)?: A Little Help needed to walk in hospital room?: A Little Help needed climbing 3-5 steps with a railing? : A Lot 6 Click Score: 17    End of Session Equipment Utilized During Treatment: Gait belt Activity Tolerance: Patient limited by pain Patient left: in chair;with call bell/phone within reach;with chair alarm set Nurse Communication: Mobility status PT Visit Diagnosis: Muscle weakness (generalized) (M62.81);Difficulty in walking, not elsewhere classified (R26.2)     Time: [id number] PT Time Calculation (min) (ACUTE ONLY): 33 min  Charges:  $Gait Training: 8-22 mins $Therapeutic Exercise: 8-22 mins                    Blondell Reveal Kistler PT 06/09/2019  Acute Rehabilitation Services Pager [phone] Office [phone]

## 2019-06-09 NOTE — Progress Notes (Signed)
Physical Therapy Treatment Patient Details Name: Stephen Medina MRN: [id number] DOB: October 27, 1950 Today's Date: 06/09/2019    History of Present Illness Patient is 69 y.o. male s/p Rt THA anterior approach on 06/08/19 with PMH significant for obesity, HTN, HLD, GERD, CAD, neuropathy, Lumbar lam/decompression L5-S1, cardiac cath (1 stent). Pt reports he had a rod placed in L tibia in Feb 2021.    PT Comments    Pt reports improved pain control, he tolerated increased ambulation distance of 130' with RW. Stair training completed. He is ready to DC home from PT standpoint.  Follow Up Recommendations  Follow surgeon's recommendation for DC plan and follow-up therapies;Home health PT     Equipment Recommendations  None recommended by PT    Recommendations for Other Services       Precautions / Restrictions Precautions Precautions: Fall Restrictions Weight Bearing Restrictions: No    Mobility  Bed Mobility               General bed mobility comments: up in recliner  Transfers Overall transfer level: Needs assistance Equipment used: Rolling walker (2 wheeled) Transfers: Sit to/from Stand Sit to Stand: From elevated surface;Modified independent (Device/Increase time)         General transfer comment: good hand placement  Ambulation/Gait Ambulation/Gait assistance: Modified independent (Device/Increase time) Gait Distance (Feet): 130 Feet Assistive device: Rolling walker (2 wheeled) Gait Pattern/deviations: Step-to pattern;Shuffle;Trunk flexed;Antalgic;Decreased stance time - right;Decreased step length - right;Decreased step length - left Gait velocity: decr   General Gait Details: VCs for posture, pt reports flexed trunk at baseline 2* 5 back surgeries, no loss of balance   Stairs Stairs: Yes Stairs assistance: Min assist Stair Management: No rails;Backwards;Forwards;With walker Number of Stairs: 1 General stair comments: instructed pt in forward and in backward  technique with RW for 1 step, min A to manage RW   Wheelchair Mobility    Modified Rankin (Stroke Patients Only)       Balance Overall balance assessment: Needs assistance Sitting-balance support: Feet supported Sitting balance-Leahy Scale: Fair     Standing balance support: During functional activity;Bilateral upper extremity supported Standing balance-Leahy Scale: Poor Standing balance comment: pt heavily reliant on UE support                            Cognition Arousal/Alertness: Awake/alert Behavior During Therapy: WFL for tasks assessed/performed Overall Cognitive Status: Within Functional Limits for tasks assessed                                        Exercises      General Comments        Pertinent Vitals/Pain Pain Score: 4  Pain Location: Rt hip Pain Descriptors / Indicators: Aching;Discomfort;Sore Pain Intervention(s): Limited activity within patient's tolerance;Monitored during session;Premedicated before session;Ice applied    Home Living                      Prior Function            PT Goals (current goals can now be found in the care plan section) Acute Rehab PT Goals Patient Stated Goal: get home and have less pain PT Goal Formulation: With patient Time For Goal Achievement: 06/15/19 Potential to Achieve Goals: Good Progress towards PT goals: Goals met/education completed, patient discharged from PT    Frequency  7X/week      PT Plan Current plan remains appropriate    Co-evaluation              AM-PAC PT "6 Clicks" Mobility   Outcome Measure  Help needed turning from your back to your side while in a flat bed without using bedrails?: None Help needed moving from lying on your back to sitting on the side of a flat bed without using bedrails?: None Help needed moving to and from a bed to a chair (including a wheelchair)?: None Help needed standing up from a chair using your arms (e.g.,  wheelchair or bedside chair)?: None Help needed to walk in hospital room?: None Help needed climbing 3-5 steps with a railing? : A Little 6 Click Score: 23    End of Session Equipment Utilized During Treatment: Gait belt Activity Tolerance: Patient tolerated treatment well Patient left: in chair;with call bell/phone within reach;with chair alarm set Nurse Communication: Mobility status PT Visit Diagnosis: Muscle weakness (generalized) (M62.81);Difficulty in walking, not elsewhere classified (R26.2)     Time: 9728-2060 PT Time Calculation (min) (ACUTE ONLY): 16 min  Charges:  $Gait Training: 8-22 mins                     Blondell Reveal Kistler PT 06/09/2019  Acute Rehabilitation Services Pager [phone] Office [phone]

## 2019-06-14 NOTE — Discharge Summary (Signed)
Physician Discharge Summary   Patient ID: Stephen Medina MRN: [id number] DOB/AGE: 67-Jan-1952 67 y.o.  Admit date: 06/08/2019  Discharge date: 06/09/2019  Primary Diagnosis: Osteoarthritis, right hip   Admission Diagnoses:  Past Medical History:  Diagnosis Date  . CAD (coronary artery disease) 08/01/2013   Cath 2005  normal Left main, scattered irregularities LAD, 80% stenosis ostial Diag 1, scattered irregularities CFX, scattered irregularities RCA  Taxus stent March 2005 Diagonal   . Disc displacement, lumbar   . Foot ulceration ([place])    left  . GERD (gastroesophageal reflux disease)   . History of ulcer disease 10/19/2013  . Hyperlipidemia   . Hypertension   . Lumbar spondylosis 04/01/2013  . Morbid obesity ([place])   . Neuromuscular disorder ([facility])    neuropathy left foot   . Wears glasses    Discharge Diagnoses:   Principal Problem:   OA (osteoarthritis) of hip Active Problems:   Primary osteoarthritis of right hip  Estimated body mass index is 39.89 kg/m as calculated from the following:   Height as of this encounter: 6' (1.829 m).   Weight as of this encounter: 133.4 kg.  Procedure:  Procedure(s) (LRB): TOTAL HIP ARTHROPLASTY ANTERIOR APPROACH (Right)   Consults: None  HPI: Stephen Medina is a 69 y.o. male who has advanced end-  stage arthritis of their Right  hip with progressively worsening pain and  dysfunction.The patient has failed nonoperative management and presents for total hip arthroplasty.   Laboratory Data: Admission on 06/08/2019, Discharged on 06/09/2019  Component Date Value Ref Range Status  . WBC 06/09/2019 10.8* 4.0 - 10.5 K/uL Final  . RBC 06/09/2019 3.94* 4.22 - 5.81 MIL/uL Final  . Hemoglobin 06/09/2019 11.7* 13.0 - 17.0 g/dL Final  . HCT 06/09/2019 36.5* 39.0 - 52.0 % Final  . MCV 06/09/2019 92.6  80.0 - 100.0 fL Final  . MCH 06/09/2019 29.7  26.0 - 34.0 pg Final  . MCHC 06/09/2019 32.1  30.0 - 36.0 g/dL Final  . RDW 06/09/2019 13.4  11.5  - 15.5 % Final  . Platelets 06/09/2019 181  150 - 400 K/uL Final  . nRBC 06/09/2019 0.0  0.0 - 0.2 % Final   Performed at [facility], [place] [address], [place], [place] [address]  . Sodium 06/09/2019 135  135 - 145 mmol/L Final  . Potassium 06/09/2019 4.2  3.5 - 5.1 mmol/L Final  . Chloride 06/09/2019 103  98 - 111 mmol/L Final  . CO2 06/09/2019 22  22 - 32 mmol/L Final  . Glucose, Bld 06/09/2019 184* 70 - 99 mg/dL Final   Glucose reference range applies only to samples taken after fasting for at least 8 hours.  . BUN 06/09/2019 12  8 - 23 mg/dL Final  . Creatinine, Ser 06/09/2019 0.82  0.61 - 1.24 mg/dL Final  . Calcium 06/09/2019 8.4* 8.9 - 10.3 mg/dL Final  . GFR calc non Af Amer 06/09/2019 >60  >60 mL/min Final  . GFR calc Af Amer 06/09/2019 >60  >60 mL/min Final  . Anion gap 06/09/2019 10  5 - 15 Final   Performed at [facility], [place] [address] [place], [place] [address]  Hospital Outpatient Visit on 06/04/2019  Component Date Value Ref Range Status  . SARS Coronavirus 2 06/04/2019 NEGATIVE  NEGATIVE Final   Comment: (NOTE) SARS-CoV-2 target nucleic acids are NOT DETECTED. The SARS-CoV-2 RNA is generally detectable in upper and lower respiratory specimens during the acute phase of infection. Negative  results do not preclude SARS-CoV-2 infection, do not rule out co-infections with other pathogens, and should not be used as the sole basis for treatment or other patient management decisions. Negative results must be combined with clinical observations, patient history, and epidemiological information. The expected result is Negative. Fact Sheet for Patients: SugarRoll.be Fact Sheet for Healthcare Providers: https://www.woods-mathews.com/ This test is not yet approved or cleared by the [place] FDA and  has been authorized for detection and/or diagnosis of SARS-CoV-2 by FDA under an Emergency Use  Authorization (EUA). This EUA will remain  in effect (meaning this test can be used) for the duration of the COVID-19 declaration under Section 56                          4(b)(1) of the Act, 21 U.S.C. section 360bbb-3(b)(1), unless the authorization is terminated or revoked sooner. Performed at [facility], [place] [address] [place], [place] [address]   Office Visit on 06/01/2019  Component Date Value Ref Range Status  . Rest HR 06/03/2019 49  bpm Final  . Rest BP 06/03/2019 112/60  mmHg Final  . Peak HR 06/03/2019 58  bpm Final  . Peak BP 06/03/2019 122/73  mmHg Final  . SSS 06/03/2019 0   Final  . SRS 06/03/2019 0   Final  . SDS 06/03/2019 0   Final  . TID 06/03/2019 1.04   Final  . LV sys vol 06/03/2019 51  mL Final  . LV dias vol 06/03/2019 120  62 - 150 mL Final  Hospital Outpatient Visit on 05/26/2019  Component Date Value Ref Range Status  . MRSA, PCR 05/26/2019 NEGATIVE  NEGATIVE Final  . Staphylococcus aureus 05/26/2019 NEGATIVE  NEGATIVE Final   Comment: (NOTE) The Xpert SA Assay (FDA approved for NASAL specimens in patients 28 years of age and older), is one component of a comprehensive surveillance program. It is not intended to diagnose infection nor to guide or monitor treatment. Performed at [facility], [place] [address] [place], [place] [address]   . aPTT 05/26/2019 26  24 - 36 seconds Final   Performed at [facility], [place] [address] [place], [place] [address]  . WBC 05/26/2019 6.6  4.0 - 10.5 K/uL Final  . RBC 05/26/2019 4.87  4.22 - 5.81 MIL/uL Final  . Hemoglobin 05/26/2019 14.5  13.0 - 17.0 g/dL Final  . HCT 05/26/2019 43.7  39.0 - 52.0 % Final  . MCV 05/26/2019 89.7  80.0 - 100.0 fL Final  . MCH 05/26/2019 29.8  26.0 - 34.0 pg Final  . MCHC 05/26/2019 33.2  30.0 - 36.0 g/dL Final  . RDW 05/26/2019 13.6  11.5 - 15.5 % Final  . Platelets 05/26/2019 242  150 - 400 K/uL Final  . nRBC 05/26/2019 0.0  0.0 - 0.2 %  Final   Performed at [facility], [place] [address], [place],  [address]  . Sodium 05/26/2019 140  135 - 145 mmol/L Final  . Potassium 05/26/2019 4.2  3.5 - 5.1 mmol/L Final  . Chloride 05/26/2019 108  98 - 111 mmol/L Final  . CO2 05/26/2019 23  22 - 32 mmol/L Final  . Glucose, Bld 05/26/2019 100* 70 - 99 mg/dL Final   Glucose reference range applies only to samples taken after fasting for at least 8 hours.  . BUN 05/26/2019 12  8 - 23 mg/dL Final  . Creatinine, Ser 05/26/2019  0.89  0.61 - 1.24 mg/dL Final  . Calcium 05/26/2019 9.3  8.9 - 10.3 mg/dL Final  . Total Protein 05/26/2019 7.5  6.5 - 8.1 g/dL Final  . Albumin 05/26/2019 4.3  3.5 - 5.0 g/dL Final  . AST 05/26/2019 24  15 - 41 U/L Final  . ALT 05/26/2019 20  0 - 44 U/L Final  . Alkaline Phosphatase 05/26/2019 96  38 - 126 U/L Final  . Total Bilirubin 05/26/2019 0.8  0.3 - 1.2 mg/dL Final  . GFR calc non Af Amer 05/26/2019 >60  >60 mL/min Final  . GFR calc Af Amer 05/26/2019 >60  >60 mL/min Final  . Anion gap 05/26/2019 9  5 - 15 Final   Performed at [facility], [place] [address] [place], [place] [address]  . Prothrombin Time 05/26/2019 13.0  11.4 - 15.2 seconds Final  . INR 05/26/2019 1.0  0.8 - 1.2 Final   Comment: (NOTE) INR goal varies based on device and disease states. Performed at [facility], [place] [address], [place], [place] [address]   . ABO/RH(D) 05/26/2019 O POS   Final  . Antibody Screen 05/26/2019 NEG   Final  . Sample Expiration 05/26/2019 06/09/2019,2359   Final  . Extend sample reason 05/26/2019    Final                   Value:NO TRANSFUSIONS OR PREGNANCY IN THE PAST 3 MONTHS Performed at [place] [address] [place], [place] [address]   . ABO/RH(D) 05/26/2019    Final                   Value:O POS Performed at [facility], [place] [address], [place], [place] [address]      X-Rays:CT Chest Wo  Contrast  Result Date: 06/07/2019 CLINICAL DATA:  Follow-up thoracic aortic aneurysm. EXAM: CT CHEST WITHOUT CONTRAST TECHNIQUE: Multidetector CT imaging of the chest was performed following the standard protocol without IV contrast. COMPARISON:  12/02/2017 FINDINGS: Cardiovascular: Ascending thoracic aortic aneurysm measures 4.4 cm in diameter (image 63/2), without significant change compared to prior study. Aortic and coronary artery atherosclerosis again noted. Mediastinum/Nodes: No masses or pathologically enlarged lymph nodes identified on this unenhanced exam. Lungs/Pleura: 4 mm left upper lobe pulmonary nodule remains stable, consistent with benign etiology. No suspicious nodules or masses identified. No evidence of infiltrate or pleural effusion. Upper Abdomen:  Unremarkable. Musculoskeletal:  No suspicious bone lesions. IMPRESSION: 1. Stable 4.4 cm ascending thoracic aortic aneurysm. Recommend annual imaging followup by CTA or MRA. This recommendation follows 2010 ACCF/AHA/AATS/ACR/ASA/SCA/SCAI/SIR/STS/SVM Guidelines for the Diagnosis and Management of Patients with Thoracic Aortic Disease. Circulation. 2010; [id number]. Aortic aneurysm NOS (ICD10-I71.9) 2. No acute findings. Aortic Atherosclerosis (ICD10-I70.0). Coronary artery atherosclerosis. Electronically Signed   By: Marlaine Hind M.D.   On: 06/07/2019 13:23   DG Pelvis Portable  Result Date: 06/08/2019 CLINICAL DATA:  Status post total hip replacement EXAM: PORTABLE PELVIS 1-2 VIEWS COMPARISON:  Intraoperative right hip image Jun 08, 2019 FINDINGS: Frontal view of lower pelvis and right hip obtained. There is a total hip replacement right with prosthetic components well-seated on frontal view. No fracture or dislocation evident. Mild narrowing left hip joint. IMPRESSION: Total hip replacement on the right with prosthetic components appearing well-seated on frontal view. No fracture or dislocation evident. Mild narrowing left hip joint.  Electronically Signed   By: Lowella Grip III M.D.   On: 06/08/2019 12:08  DG C-Arm 1-60 Min-No Report  Result Date: 06/08/2019 Fluoroscopy was utilized by the requesting physician.  No radiographic interpretation.   MYOCARDIAL PERFUSION IMAGING  Result Date: 06/03/2019  The left ventricular ejection fraction is normal (55-65%).  Nuclear stress EF: 58%.  There was no ST segment deviation noted during stress.  This is a low risk study.  Compared to prior study, EF is improved on current study.    ECHOCARDIOGRAM COMPLETE  Result Date: 06/05/2019    ECHOCARDIOGRAM REPORT   Patient Name:   Stephen Medina Date of Exam: 06/03/2019 Medical Rec #:  [id number]      Height:       72.0 in Accession #:    [id number]     Weight:       294.0 lb Date of Birth:  [birth year]      BSA:          2.510 m Patient Age:    51 years       BP:           150/80 mmHg Patient Gender: M              HR:           64 bpm. Exam Location:  [place] [facility] Procedure: 2D Echo, Cardiac Doppler, Color Doppler and Intracardiac            Opacification Agent Indications:    I25.10 CAD                 Pre-Op Eval  History:        Patient has prior history of Echocardiogram examinations, most                 recent 12/28/2017. CAD; Risk Factors:Hypertension, Dyslipidemia                 and Family History of Coronary Artery Disease. Pre-Operative                 Evaluation for Right Hip Replacement, Morbid Obesity.  Sonographer:    Deliah Boston RDCS Referring Phys: [facility] IMPRESSIONS  1. TDS     . Left ventricular ejection fraction, by estimation, is 55 to 60%. The left ventricle has normal function. The left ventricle has no regional wall motion abnormalities. Left ventricular diastolic parameters are consistent with Grade I diastolic dysfunction (impaired relaxation).  2. Right ventricular systolic function is normal. The right ventricular size is mildly enlarged.  3. Left atrial size was mildly dilated.  4. The mitral  valve is normal in structure. No evidence of mitral valve regurgitation. No evidence of mitral stenosis.  5. The aortic valve is normal in structure. Aortic valve regurgitation is not visualized. No aortic stenosis is present.  6. The inferior vena cava is normal in size with greater than 50% respiratory variability, suggesting right atrial pressure of 3 mmHg. FINDINGS  Left Ventricle: TDS. Left ventricular ejection fraction, by estimation, is 55 to 60%. The left ventricle has normal function. The left ventricle has no regional wall motion abnormalities. Definity contrast agent was given IV to delineate the left ventricular endocardial borders. The left ventricular internal cavity size was normal in size. There is no left ventricular hypertrophy. Left ventricular diastolic parameters are consistent with Grade I diastolic dysfunction (impaired relaxation). Right Ventricle: The right ventricular size is mildly enlarged. No increase in right ventricular wall thickness. Right ventricular systolic function is normal. Left Atrium: Left atrial size was mildly dilated.  Right Atrium: Right atrial size was normal in size. Pericardium: There is no evidence of pericardial effusion. Mitral Valve: The mitral valve is normal in structure. Normal mobility of the mitral valve leaflets. No evidence of mitral valve regurgitation. No evidence of mitral valve stenosis. Tricuspid Valve: The tricuspid valve is normal in structure. Tricuspid valve regurgitation is trivial. No evidence of tricuspid stenosis. Aortic Valve: The aortic valve is normal in structure. Aortic valve regurgitation is not visualized. No aortic stenosis is present. Pulmonic Valve: The pulmonic valve was normal in structure. Pulmonic valve regurgitation is not visualized. No evidence of pulmonic stenosis. Aorta: The aortic root is normal in size and structure. Venous: The inferior vena cava is normal in size with greater than 50% respiratory variability, suggesting  right atrial pressure of 3 mmHg. IAS/Shunts: No atrial level shunt detected by color flow Doppler.  LEFT VENTRICLE PLAX 2D LVIDd:         4.82 cm  Diastology LVIDs:         3.02 cm  LV e' lateral:   14.00 cm/s LV PW:         1.05 cm  LV E/e' lateral: 4.3 LV IVS:        0.92 cm  LV e' medial:    7.40 cm/s LVOT diam:     2.70 cm  LV E/e' medial:  8.2 LV SV:         121 LV SV Index:   48 LVOT Area:     5.73 cm  RIGHT VENTRICLE RV S prime:     11.30 cm/s TAPSE (M-mode): 2.2 cm LEFT ATRIUM           Index       RIGHT ATRIUM           Index LA diam:      4.30 cm 1.71 cm/m  RA Area:     26.30 cm LA Vol (A2C): 55.9 ml 22.27 ml/m RA Volume:   80.30 ml  32.00 ml/m LA Vol (A4C): 98.6 ml 39.29 ml/m  AORTIC VALVE LVOT Vmax:   92.30 cm/s LVOT Vmean:  57.800 cm/s LVOT VTI:    0.211 m  AORTA Ao Root diam: 4.30 cm MITRAL VALVE MV Area (PHT): cm         SHUNTS MV Decel Time: 322 msec    Systemic VTI:  0.21 m MV E velocity: 60.65 cm/s  Systemic Diam: 2.70 cm MV A velocity: 78.85 cm/s MV E/A ratio:  0.77 Jenne Campus MD Electronically signed by Jenne Campus MD Signature Date/Time: 06/05/2019/10:30:14 PM    Final    DG HIP OPERATIVE UNILAT W OR W/O PELVIS RIGHT  Result Date: 06/08/2019 CLINICAL DATA:  Right total hip arthroplasty EXAM: OPERATIVE RIGHT HIP WITH PELVIS COMPARISON:  None. FLUOROSCOPY TIME:  Fluoroscopy Time:  0 minutes 10 seconds Number of Acquired Spot Images: 2 FINDINGS: Two spot fluoroscopic nondiagnostic intraoperative right hip radiographs demonstrate postsurgical changes from right total hip arthroplasty with no evidence of right hip dislocation on these frontal views. IMPRESSION: Intraoperative fluoroscopic guidance for right total hip arthroplasty. Electronically Signed   By: Ilona Sorrel M.D.   On: 06/08/2019 11:09    EKG: Orders placed or performed in visit on 06/01/19  . EKG 12-Lead     Hospital Course: Stephen Medina is a 6 y.o. who was admitted to [facility] [place]. They were  brought to the operating room on 06/08/2019 and underwent Procedure(s): [place].  Patient tolerated  the procedure well and was later transferred to the recovery room and then to the orthopaedic floor for postoperative care. They were given PO and IV analgesics for pain control following their surgery. They were given 24 hours of postoperative antibiotics of  Anti-infectives (From admission, onward)   Start     Dose/Rate Route Frequency Ordered Stop   06/08/19 1630  ceFAZolin (ANCEF) IVPB 2g/100 mL premix     2 g 200 mL/hr over 30 Minutes Intravenous Every 6 hours 06/08/19 1258 06/08/19 2201   06/08/19 0600  ceFAZolin (ANCEF) 3 g in dextrose 5 % 50 mL IVPB     3 g 100 mL/hr over 30 Minutes Intravenous On call to O.R. 06/07/19 [id number] 06/08/19 1001     and started on DVT prophylaxis in the form of Aspirin and Plavix.   PT and OT were ordered for total joint protocol. Discharge planning consulted to help with postop disposition and equipment needs.  Patient had a good night on the evening of surgery. They started to get up OOB with therapy on POD #0. Pt was seen during rounds and was ready to go home pending progress with therapy. Hemovac drain was pulled without difficulty. He worked with therapy on POD #1 and was meeting his goals. Pt was discharged to home later that day in stable condition.  Diet: Cardiac diet Activity: WBAT Follow-up: in 2 weeks Disposition: Home with HEP Discharged Condition: stable   Discharge Instructions    Call MD / Call 911   Complete by: As directed    If you experience chest pain or shortness of breath, CALL 911 and be transported to the hospital emergency room.  If you develope a fever above 101 F, pus (white drainage) or increased drainage or redness at the wound, or calf pain, call your surgeon's office.   Change dressing   Complete by: As directed    You have an adhesive waterproof bandage over the incision. Leave this in place  until your first follow-up appointment. Once you remove this you will not need to place another bandage.   Constipation Prevention   Complete by: As directed    Drink plenty of fluids.  Prune juice may be helpful.  You may use a stool softener, such as Colace (over the counter) 100 mg twice a day.  Use MiraLax (over the counter) for constipation as needed.   Diet - low sodium heart healthy   Complete by: As directed    Do not sit on low chairs, stoools or toilet seats, as it may be difficult to get up from low surfaces   Complete by: As directed    Driving restrictions   Complete by: As directed    No driving for two weeks   TED hose   Complete by: As directed    Use stockings (TED hose) for three weeks on both leg(s).  You may remove them at night for sleeping.   Weight bearing as tolerated   Complete by: As directed      Allergies as of 06/09/2019      Reactions   Adhesive [tape] Other (See Comments)   Blisters.  Paper tape ok.   Other Other (See Comments)   Blisters.  Paper tape ok.      Medication List    TAKE these medications   acetaminophen 325 MG tablet Commonly known as: TYLENOL Take 650 mg by mouth every 4 (four) hours as needed for pain.   aspirin 325 MG EC tablet  Take 325 mg by mouth daily.   clopidogrel 75 MG tablet Commonly known as: PLAVIX TAKE 1 TABLET BY MOUTH EVERY DAY   ezetimibe 10 MG tablet Commonly known as: ZETIA TAKE 1 TABLET BY MOUTH DAILY   HYDROcodone-acetaminophen 5-325 MG tablet Commonly known as: NORCO/VICODIN Take 1-2 tablets by mouth every 6 (six) hours as needed for severe pain.   meloxicam 15 MG tablet Commonly known as: MOBIC Take 15 mg by mouth daily.   methocarbamol 500 MG tablet Commonly known as: ROBAXIN Take 1 tablet (500 mg total) by mouth every 6 (six) hours as needed for muscle spasms.   metoprolol succinate 25 MG 24 hr tablet Commonly known as: TOPROL-XL TAKE 1 TABLET BY MOUTH EVERY DAY   nitroGLYCERIN 0.4 MG SL  tablet Commonly known as: NITROSTAT Place 1 tablet (0.4 mg total) under the tongue every 5 (five) minutes as needed.   pantoprazole 40 MG tablet Commonly known as: PROTONIX TAKE 1 TABLET BY MOUTH DAILY   rosuvastatin 40 MG tablet Commonly known as: CRESTOR Take 0.5 tablets (20 mg total) by mouth daily.   traMADol 50 MG tablet Commonly known as: ULTRAM Take 1-2 tablets (50-100 mg total) by mouth every 6 (six) hours as needed for moderate pain.   Vitamin D-3 125 MCG (5000 UT) Tabs Take 500 mg by mouth daily.   zinc gluconate 50 MG tablet Take 50 mg by mouth daily.            Discharge Care Instructions  (From admission, onward)         Start     Ordered   06/09/19 0000  Weight bearing as tolerated     06/09/19 0822   06/09/19 0000  Change dressing    Comments: You have an adhesive waterproof bandage over the incision. Leave this in place until your first follow-up appointment. Once you remove this you will not need to place another bandage.   06/09/19 [id number]         Follow-up Information    Gaynelle Arabian, MD. Schedule an appointment as soon as possible for a visit on 06/21/2019.   Specialty: Orthopedic Surgery Contact information: [address] [place] [address] [id number]           Signed: Theresa Duty, PA-C Orthopedic Surgery 06/14/2019, 12:31 PM

## 2019-06-28 ENCOUNTER — Ambulatory Visit: Payer: Medicare Other

## 2019-07-10 ENCOUNTER — Other Ambulatory Visit: Payer: Self-pay | Admitting: Cardiology

## 2019-08-07 ENCOUNTER — Other Ambulatory Visit: Payer: Self-pay | Admitting: Cardiology

## 2019-08-08 ENCOUNTER — Other Ambulatory Visit: Payer: Self-pay

## 2019-08-08 ENCOUNTER — Ambulatory Visit (INDEPENDENT_AMBULATORY_CARE_PROVIDER_SITE_OTHER): Payer: Medicare Other | Admitting: Cardiology

## 2019-08-08 ENCOUNTER — Encounter: Payer: Self-pay | Admitting: Cardiology

## 2019-08-08 VITALS — BP 110/78 | HR 74 | Ht 72.0 in | Wt 296.0 lb

## 2019-08-08 DIAGNOSIS — I77810 Thoracic aortic ectasia: Secondary | ICD-10-CM

## 2019-08-08 DIAGNOSIS — I10 Essential (primary) hypertension: Secondary | ICD-10-CM | POA: Diagnosis not present

## 2019-08-08 DIAGNOSIS — E782 Mixed hyperlipidemia: Secondary | ICD-10-CM | POA: Diagnosis not present

## 2019-08-08 DIAGNOSIS — I2510 Atherosclerotic heart disease of native coronary artery without angina pectoris: Secondary | ICD-10-CM | POA: Diagnosis not present

## 2019-08-08 NOTE — Progress Notes (Signed)
Cardiology Office Note:    Date:  08/08/2019   ID:  Stephen Medina, DOB 06-21-1950, MRN [id number]  PCP:  Sharilyn Sites, MD  Cardiologist:  Jenean Lindau, MD   Referring MD: Sharilyn Sites, MD    ASSESSMENT:    No diagnosis found. PLAN:    In order of problems listed above:  1. Coronary artery disease: Secondary prevention stressed with the patient.  Importance of compliance with diet medication stressed and he vocalized understanding. 2. Essential hypertension: Blood pressure stable 3. Ascending aortic aneurysm: Stable patient is on appropriate therapy including statins 4. Morbid obesity: I discussed my findings with the patient at length weight reduction was stressed and he promises to do better 5. Mixed dyslipidemia: Recent lipids were reviewed from [place] and discussed with the patient.  They are fine. 6. Patient will be seen in follow-up appointment in 6 months or earlier if the patient has any concerns    Medication Adjustments/Labs and Tests Ordered: Current medicines are reviewed at length with the patient today.  Concerns regarding medicines are outlined above.  No orders of the defined types were placed in this encounter.  No orders of the defined types were placed in this encounter.    Chief Complaint  Patient presents with  . Follow-up     History of Present Illness:    Stephen Medina is a 69 y.o. male.  Patient has past medical history of coronary artery disease, essential hypertension dyslipidemia and aortic aneurysm.  He denies any problems at this time and takes care of activities of daily living.  No chest pain orthopnea or PND.  He has undergone hip and foot surgery successfully and is recovering.  At the time of my evaluation, the patient is alert awake oriented and in no distress.  Past Medical History:  Diagnosis Date  . CAD (coronary artery disease) 08/01/2013   Cath 2005  normal Left main, scattered irregularities LAD, 80% stenosis ostial Diag  1, scattered irregularities CFX, scattered irregularities RCA  Taxus stent March 2005 Diagonal   . Disc displacement, lumbar   . Foot ulceration ([place])    left  . GERD (gastroesophageal reflux disease)   . History of ulcer disease 10/19/2013  . Hyperlipidemia   . Hypertension   . Lumbar spondylosis 04/01/2013  . Morbid obesity ([place])   . Neuromuscular disorder ([facility])    neuropathy left foot   . Wears glasses     Past Surgical History:  Procedure Laterality Date  . APPENDECTOMY    . BACK SURGERY    . BONE EXOSTOSIS EXCISION Left 01/28/2018   Procedure: Exostectomy left first metatarsal base and medial cuneiform;  Surgeon: Wylene Simmer, MD;  Location: [facility];  Service: Orthopedics;  Laterality: Left;  50min  . CARDIAC CATHETERIZATION     05    1 stent   dr Wynonia Lawman  . ESOPHAGOGASTRODUODENOSCOPY N/A 08/02/2013   Procedure: ESOPHAGOGASTRODUODENOSCOPY (EGD);  Surgeon: Ladene Artist, MD;  Location: [facility] ENDOSCOPY;  Service: Endoscopy;  Laterality: N/A;  . FOOT ARTHRODESIS Left 05/22/2016   Procedure: Left Subtalar and Talonavicular Joint Arthrodesis;  Surgeon: Wylene Simmer, MD;  Location: [facility];  Service: Orthopedics;  Laterality: Left;  . FOOT SURGERY    . GASTROCNEMIUS RECESSION Left 05/22/2016   Procedure: Left Gastroc Recession;  Surgeon: Wylene Simmer, MD;  Location: [facility];  Service: Orthopedics;  Laterality: Left;  Marland Kitchen GRAFT APPLICATION N/A 03/14/9516   Procedure: [employer] GRAFT APPLICATION;  Surgeon: Trula Slade, DPM;  Location: [facility];  Service: Podiatry;  Laterality: N/A;  . LEFT HEART CATHETERIZATION WITH CORONARY ANGIOGRAM N/A 02/09/2014   Procedure: LEFT HEART CATHETERIZATION WITH CORONARY ANGIOGRAM;  Surgeon: Peter M Martinique, MD;  Location: [facility] CATH LAB;  Service: Cardiovascular;  Laterality: N/A;  . LUMBAR LAMINECTOMY     x2  . LUMBAR LAMINECTOMY/DECOMPRESSION MICRODISCECTOMY Left 09/03/2015   Procedure:  MICRODISCECTOMY L5-S1 LEFT;  Surgeon: Ashok Pall, MD;  Location: [facility] NEURO ORS;  Service: Neurosurgery;  Laterality: Left;   MICRODISCECTOMY L5-S1 LEFT  . LUMBAR LAMINECTOMY/DECOMPRESSION MICRODISCECTOMY Left 02/13/2016   Procedure: MICRODISCECTOMY LUMBAR FIVE- SACRAL ONE LEFT;  Surgeon: Ashok Pall, MD;  Location: [facility];  Service: Neurosurgery;  Laterality: Left;  . TONSILLECTOMY    . TOTAL HIP ARTHROPLASTY Right 06/08/2019   Procedure: TOTAL HIP ARTHROPLASTY ANTERIOR APPROACH;  Surgeon: Gaynelle Arabian, MD;  Location: [facility];  Service: Orthopedics;  Laterality: Right;  149min  . WOUND DEBRIDEMENT N/A 06/22/2015   Procedure: DEBRIDEMENT WOUND;  Surgeon: Trula Slade, DPM;  Location: [facility];  Service: Podiatry;  Laterality: N/A;    Current Medications: Current Meds  Medication Sig  . acetaminophen (TYLENOL) 325 MG tablet Take 650 mg by mouth every 4 (four) hours as needed for pain.  Marland Kitchen aspirin 325 MG EC tablet Take 325 mg by mouth daily.   . Cholecalciferol (VITAMIN D-3) 125 MCG (5000 UT) TABS Take 500 mg by mouth daily.  . clopidogrel (PLAVIX) 75 MG tablet TAKE 1 TABLET BY MOUTH EVERY DAY  . ezetimibe (ZETIA) 10 MG tablet TAKE 1 TABLET BY MOUTH DAILY  . meloxicam (MOBIC) 15 MG tablet Take 15 mg by mouth daily.   . methocarbamol (ROBAXIN) 500 MG tablet Take 1 tablet (500 mg total) by mouth every 6 (six) hours as needed for muscle spasms.  . metoprolol succinate (TOPROL-XL) 25 MG 24 hr tablet TAKE 1 TABLET BY MOUTH EVERY DAY  . nitroGLYCERIN (NITROSTAT) 0.4 MG SL tablet Place 1 tablet (0.4 mg total) under the tongue every 5 (five) minutes as needed.  . pantoprazole (PROTONIX) 40 MG tablet TAKE 1 TABLET BY MOUTH DAILY  . rosuvastatin (CRESTOR) 40 MG tablet Take 0.5 tablets (20 mg total) by mouth daily.  . traMADol (ULTRAM) 50 MG tablet Take 1-2 tablets (50-100 mg total) by mouth every 6 (six) hours as needed for moderate pain.  Marland Kitchen zinc gluconate 50 MG tablet Take 50 mg by  mouth daily.     Allergies:   Adhesive [tape] and Other   Social History   Socioeconomic History  . Marital status: Married    Spouse name: Not on file  . Number of children: Not on file  . Years of education: Not on file  . Highest education level: Not on file  Occupational History  . Not on file  Tobacco Use  . Smoking status: Never Smoker  . Smokeless tobacco: Former Systems developer    Types: Secondary school teacher  . Vaping Use: Never used  Substance and Sexual Activity  . Alcohol use: No    Alcohol/week: 1.0 standard drink    Types: 1 Glasses of wine per week  . Drug use: No  . Sexual activity: Not Currently  Other Topics Concern  . Not on file  Social History Narrative   Pt lives in [place] with his wife. They have one son who does not live in the area. Pt previously worked for [employer] but is retired. He  used to [employer] basketball. He has never smoke and drinks 4-5 alcoholic beverages per week. No illicit drug use.   Social Determinants of Health   Financial Resource Strain:   . Difficulty of Paying Living Expenses:   Food Insecurity:   . Worried About Charity fundraiser in the Last Year:   . Arboriculturist in the Last Year:   Transportation Needs:   . Film/video editor (Medical):   Marland Kitchen Lack of Transportation (Non-Medical):   Physical Activity:   . Days of Exercise per Week:   . Minutes of Exercise per Session:   Stress:   . Feeling of Stress :   Social Connections:   . Frequency of Communication with Friends and Family:   . Frequency of Social Gatherings with Friends and Family:   . Attends Religious Services:   . Active Member of Clubs or Organizations:   . Attends Archivist Meetings:   Marland Kitchen Marital Status:      Family History: The patient's family history includes CAD in his cousin; Cancer in his brother; Coronary artery disease in his father. There is no history of Colon cancer, Pancreatic cancer, Rectal cancer, or Stomach cancer.  ROS:     Please see the history of present illness.    All other systems reviewed and are negative.  EKGs/Labs/Other Studies Reviewed:    The following studies were reviewed today: IMPRESSION: 1. Stable uncomplicated mild fusiform aneurysmal dilatation of the ascending thoracic aorta measuring 44 mm in greatest diameter, unchanged compared to the 02/2014 examination. Aortic aneurysm NOS (ICD10-I71.9). 2. Punctate (approximately 5 mm) left upper lobe pulmonary nodules unchanged compared to the 02/2014 examination. Stability for greater than 2 years is indicative of a benign etiology.    Recent Labs: 05/26/2019: ALT 20 06/09/2019: BUN 12; Creatinine, Ser 0.82; Hemoglobin 11.7; Platelets 181; Potassium 4.2; Sodium 135  Recent Lipid Panel    Component Value Date/Time   CHOL 179 11/30/2017 1420   TRIG 121 11/30/2017 1420   HDL 52 11/30/2017 1420   CHOLHDL 3.4 11/30/2017 1420   LDLCALC 103 (H) 11/30/2017 1420    Physical Exam:    VS:  BP 110/78 (BP Location: Right Arm, Patient Position: Sitting, Cuff Size: Large)   Pulse 74   Ht 6' (1.829 m)   Wt (!) 296 lb (134.3 kg)   SpO2 97%   BMI 40.14 kg/m     Wt Readings from Last 3 Encounters:  08/08/19 (!) 296 lb (134.3 kg)  06/08/19 294 lb 1.5 oz (133.4 kg)  06/02/19 294 lb (133.4 kg)     GEN: Patient is in no acute distress HEENT: Normal NECK: No JVD; No carotid bruits LYMPHATICS: No lymphadenopathy CARDIAC: Hear sounds regular, 2/6 systolic murmur at the apex. RESPIRATORY:  Clear to auscultation without rales, wheezing or rhonchi  ABDOMEN: Soft, non-tender, non-distended MUSCULOSKELETAL:  No edema; No deformity  SKIN: Warm and dry NEUROLOGIC:  Alert and oriented x 3 PSYCHIATRIC:  Normal affect   Signed, Jenean Lindau, MD  08/08/2019 11:18 AM    [place]

## 2019-08-08 NOTE — Patient Instructions (Signed)
Medication Instructions:  Your physician recommends that you continue on your current medications as directed. Please refer to the Current Medication list given to you today.  *If you need a refill on your cardiac medications before your next appointment, please call your pharmacy*   Lab Work: None ordered  If you have labs (blood work) drawn today and your tests are completely normal, you will receive your results only by:  [place] (if you have MyChart) OR  A paper copy in the mail If you have any lab test that is abnormal or we need to change your treatment, we will call you to review the results.   Testing/Procedures: None ordered   Follow-Up: At [facility], you and your health needs are our priority.  As part of our continuing mission to provide you with exceptional heart care, we have created designated Provider Care Teams.  These Care Teams include your primary Cardiologist (physician) and Advanced Practice Providers (APPs -  Physician Assistants and Nurse Practitioners) who all work together to provide you with the care you need, when you need it.  We recommend signing up for the patient portal called "MyChart".  Sign up information is provided on this After Visit Summary.  MyChart is used to connect with patients for Virtual Visits (Telemedicine).  Patients are able to view lab/test results, encounter notes, upcoming appointments, etc.  Non-urgent messages can be sent to your provider as well.   To learn more about what you can do with MyChart, go to [email or web address].    Your next appointment:   6 month(s)  The format for your next appointment:   In Person  Provider:   Jyl Heinz, MD   Other Instructions

## 2019-08-24 ENCOUNTER — Other Ambulatory Visit: Payer: Self-pay

## 2019-08-24 MED ORDER — ASPIRIN 325 MG PO TBEC: 325.0000 mg | DELAYED_RELEASE_TABLET | Freq: Every day | ORAL | 3 refills | Status: DC

## 2019-08-24 NOTE — Progress Notes (Signed)
Refill sent in per faxed request 

## 2019-10-18 ENCOUNTER — Other Ambulatory Visit: Payer: Self-pay | Admitting: Cardiology

## 2019-11-29 DIAGNOSIS — Z6841 Body Mass Index (BMI) 40.0 and over, adult: Secondary | ICD-10-CM | POA: Insufficient documentation

## 2019-11-29 DIAGNOSIS — M4726 Other spondylosis with radiculopathy, lumbar region: Secondary | ICD-10-CM

## 2019-11-29 HISTORY — DX: Other spondylosis with radiculopathy, lumbar region: M47.26

## 2019-11-29 HISTORY — DX: Body Mass Index (BMI) 40.0 and over, adult: Z684

## 2019-11-30 ENCOUNTER — Other Ambulatory Visit: Payer: Self-pay | Admitting: Neurosurgery

## 2019-11-30 DIAGNOSIS — M48062 Spinal stenosis, lumbar region with neurogenic claudication: Secondary | ICD-10-CM

## 2019-12-13 ENCOUNTER — Telehealth: Payer: Self-pay

## 2019-12-13 ENCOUNTER — Encounter: Payer: Self-pay | Admitting: Gastroenterology

## 2019-12-13 ENCOUNTER — Ambulatory Visit (INDEPENDENT_AMBULATORY_CARE_PROVIDER_SITE_OTHER): Payer: Medicare Other | Admitting: Gastroenterology

## 2019-12-13 VITALS — BP 126/80 | HR 67 | Ht 72.0 in | Wt 300.0 lb

## 2019-12-13 DIAGNOSIS — R1013 Epigastric pain: Secondary | ICD-10-CM | POA: Diagnosis not present

## 2019-12-13 DIAGNOSIS — R1310 Dysphagia, unspecified: Secondary | ICD-10-CM | POA: Diagnosis not present

## 2019-12-13 DIAGNOSIS — I2510 Atherosclerotic heart disease of native coronary artery without angina pectoris: Secondary | ICD-10-CM

## 2019-12-13 DIAGNOSIS — R195 Other fecal abnormalities: Secondary | ICD-10-CM

## 2019-12-13 MED ORDER — PANTOPRAZOLE SODIUM 40 MG PO TBEC: 40.0000 mg | DELAYED_RELEASE_TABLET | Freq: Two times a day (BID) | ORAL | 11 refills | Status: DC

## 2019-12-13 NOTE — Telephone Encounter (Signed)
   Primary Cardiologist: Jenean Lindau, MD  Chart reviewed as part of pre-operative protocol coverage. Given past medical history and time since last visit, based on ACC/AHA guidelines, Stephen Medina would be at acceptable risk for the planned procedure without further cardiovascular testing.   Ok to hold the Plavix 5 days prior to procedure and resume afterwards.    The patient was advised that if he develops new symptoms prior to surgery to contact our office to arrange for a follow-up visit, and he verbalized understanding.  I will route this recommendation to the requesting party via Epic fax function and remove from pre-op pool.  Please call with questions.  Cecilie Kicks, NP 12/13/2019, 4:08 PM

## 2019-12-13 NOTE — Telephone Encounter (Signed)
Dr. Geraldo Pitter can pt hold his plavix fo r5 days prior to procedure, with hx of CAD and last stent 2005.  I know he had hip surgery in May but I could not find note to hold the Plavix.  Thank you

## 2019-12-13 NOTE — Telephone Encounter (Signed)
If he has not had a stent in the past 1 year that he can hold his clopidogrel for 5 days as recommended.  Let us make sure he continues coated baby aspirin.

## 2019-12-13 NOTE — Telephone Encounter (Signed)
[facility] Pre-operative Risk Assessment     Request for surgical clearance:     Endoscopy Procedure  What type of surgery is being performed?     EGD  When is this surgery scheduled?     01/24/20  What type of clearance is required ?   Pharmacy  Are there any medications that need to be held prior to surgery and how long? Plavix x 5 days  Practice name and name of physician performing surgery?      [facility]  What is your office phone and fax number?      Phone- [phone]  [phone]  Anesthesia type (None, local, MAC, general) ?       MAC

## 2019-12-13 NOTE — Patient Instructions (Signed)
Increase your pantoprazole to 40 mg twice daily. A new prescription has been sent to your pharmacy.   You have been scheduled for an endoscopy. Please follow written instructions given to you at your visit today. If you use inhalers (even only as needed), please bring them with you on the day of your procedure.   Thank you for choosing me and [facility].  Pricilla Riffle. Dagoberto Ligas., MD., Marval Regal

## 2019-12-13 NOTE — Progress Notes (Signed)
    History of Present Illness: This is a 68 year old male with heme positive stool, dyspepsia, dysphagia, diarrhea and LLQ pain. Diarrhea and LLQ pain resolved with Campylobacter treatment with Azithromycin. Daily dyspepsia for several months persists despite daily pantoprazole.  For the past few months he notes occasional difficulty swallowing large pills and certain solid foods.  He has a history of bleeding GUs in 2015. Taking Mobic, Plavix, ASA 325 mg daily.  He has a history of coronary artery disease with a Taxus stent placed in 2005.  Evaluated by cardiology in July 2021.  Echocardiogram in May 2021 showed LVEF 55 to 60%. Denies weight loss, constipation, change in stool caliber, melena, hematochezia, nausea, vomiting, reflux symptoms, chest pain.  Colonoscopy in April 2019 showed internal hemorrhoids, otherwise normal EGD in October 2015 showed healed ulcers, variable z-line: biopsies normal  Current Medications, Allergies, Past Medical History, Past Surgical History, Family History and Social History were reviewed in [facility] record.   Physical Exam: General: Well developed, well nourished, no acute distress Head: Normocephalic and atraumatic Eyes:  sclerae anicteric, EOMI Ears: Normal auditory acuity Mouth: Not examined, mask on during Covid-19 pandemic Lungs: Clear throughout to auscultation Heart: Regular rate and rhythm; no murmurs, rubs or bruits Abdomen: Soft, non tender and non distended. No masses, hepatosplenomegaly or hernias noted. Normal Bowel sounds Rectal: Not done Musculoskeletal: Symmetrical with no gross deformities  Pulses:  Normal pulses noted Extremities: No clubbing, cyanosis, edema or deformities noted Neurological: Alert oriented x 4, grossly nonfocal Psychological:  Alert and cooperative. Normal mood and affect   Assessment and Recommendations:  1. Epigastric pain, dyspepsia, dysphagia, occult blood in stools. History of gastric  ulcers with bleed in 2015.  Taking Mobic 15 mg qd and ASA 325 mg qd. Rule out recurrent ulcer, gastritis, esophagitis, esophageal stricture.  Increase pantoprazole to 40 mg twice daily.  Colonoscopy performed 2 years ago was unremarkable, with only internal hemorrhoids found, do not plan to repeat at this time.  Schedule EGD with possible dilation. The risks (including bleeding, perforation, infection, missed lesions, medication reactions and possible hospitalization or surgery if complications occur), benefits, and alternatives to endoscopy with possible biopsy and possible dilation were discussed with the patient and they consent to proceed.   2. Diarrhea and left lower quadrant pain resolved with treatment of Campylobacter.  3. Hold Plavix 5 days before procedure - will instruct when and how to resume after procedure. Low but real risk of cardiovascular event such as heart attack, stroke, embolism, thrombosis or ischemia/infarct of other organs off Plavix explained and need to seek urgent help if this occurs. The patient consents to proceed. Will communicate by phone or EMR with patient's prescribing provider to confirm that holding Plavix is reasonable in this case.   4. BMI=40.69.

## 2019-12-14 NOTE — Telephone Encounter (Signed)
Informed patient he can hold Plavix 5 days prior to his procedure and continue aspirin per Cardiology. Patient verbalized understanding.

## 2019-12-24 ENCOUNTER — Ambulatory Visit
Admission: RE | Admit: 2019-12-24 | Discharge: 2019-12-24 | Disposition: A | Discharge status: Home / Self Care | Payer: Medicare Other | Source: Ambulatory Visit | Attending: Neurosurgery | Admitting: Neurosurgery

## 2019-12-24 ENCOUNTER — Other Ambulatory Visit: Payer: Self-pay

## 2019-12-24 DIAGNOSIS — M48062 Spinal stenosis, lumbar region with neurogenic claudication: Secondary | ICD-10-CM

## 2019-12-24 MED ORDER — GADOBENATE DIMEGLUMINE 529 MG/ML IV SOLN
20.0000 mL | Freq: Once | INTRAVENOUS | Status: AC | PRN
  Administered 2019-12-24: 20 mL via INTRAVENOUS

## 2019-12-27 ENCOUNTER — Telehealth: Payer: Self-pay

## 2019-12-27 NOTE — Telephone Encounter (Signed)
   [facility] Pre-operative Risk Assessment    HEARTCARE STAFF: - Please ensure there is not already an duplicate clearance open for this procedure. - Under Visit Info/Reason for Call, type in Other and utilize the format Clearance MM/DD/YY or Clearance TBD. Do not use dashes or single digits. - If request is for dental extraction, please clarify the # of teeth to be extracted.  Request for surgical clearance:  1. What type of surgery is being performed? Spinal Injection   2. When is this surgery scheduled? TBD  3. What type of clearance is required (medical clearance vs. Pharmacy clearance to hold med vs. Both)?  Pharmacy  4. Are there any medications that need to be held prior to surgery and how long? Plavix for 7 days  5. Practice name and name of physician performing surgery? [facility]  / Dr Marlaine Hind, MD  6. What is the office phone number? [phone]   7.   What is the office fax number? [phone]  8.   Anesthesia type (None, local, MAC, general) ? None   Toni Arthurs 12/27/2019, 12:30 PM  _________________________________________________________________   (provider comments below)

## 2019-12-27 NOTE — Telephone Encounter (Signed)
   Primary Cardiologist: Jenean Lindau, MD  Chart reviewed as part of pre-operative protocol coverage. Patient was contacted 12/27/2019 in reference to pre-operative risk assessment for pending surgery as outlined below.  Ervan Heber Garrott was last seen on 08/08/19 by Dr. Geraldo Pitter.  Pt's last stent was before 2016.  OK to hold plavix for 5-7 days prior to procedure and resume as soon as possible afterward.  Pt has not had chest pain or SOB only back pain since last appointment.  Therefore, based on ACC/AHA guidelines, the patient would be at acceptable risk for the planned procedure without further cardiovascular testing.   The patient was advised that if he develops new symptoms prior to surgery to contact our office to arrange for a follow-up visit, and he verbalized understanding.  I will route this recommendation to the requesting party via Epic fax function and remove from pre-op pool. Please call with questions.  Cecilie Kicks, NP 12/27/2019, 3:52 PM

## 2020-01-24 ENCOUNTER — Ambulatory Visit (AMBULATORY_SURGERY_CENTER): Payer: Medicare Other | Admitting: Gastroenterology

## 2020-01-24 ENCOUNTER — Encounter: Payer: Self-pay | Admitting: Gastroenterology

## 2020-01-24 ENCOUNTER — Other Ambulatory Visit: Payer: Self-pay

## 2020-01-24 VITALS — BP 140/72 | HR 54 | Temp 97.1°F | Resp 12 | Ht 72.0 in | Wt 300.0 lb

## 2020-01-24 DIAGNOSIS — K3189 Other diseases of stomach and duodenum: Secondary | ICD-10-CM | POA: Diagnosis not present

## 2020-01-24 DIAGNOSIS — R1310 Dysphagia, unspecified: Secondary | ICD-10-CM | POA: Diagnosis not present

## 2020-01-24 DIAGNOSIS — K319 Disease of stomach and duodenum, unspecified: Secondary | ICD-10-CM | POA: Diagnosis not present

## 2020-01-24 DIAGNOSIS — R195 Other fecal abnormalities: Secondary | ICD-10-CM

## 2020-01-24 DIAGNOSIS — K222 Esophageal obstruction: Secondary | ICD-10-CM | POA: Diagnosis present

## 2020-01-24 DIAGNOSIS — R1013 Epigastric pain: Secondary | ICD-10-CM

## 2020-01-24 MED ORDER — SODIUM CHLORIDE 0.9 % IV SOLN: 500.0000 mL | Freq: Once | INTRAVENOUS | Status: DC

## 2020-01-24 NOTE — Progress Notes (Signed)
Called to room to assist during endoscopic procedure.  Patient ID and intended procedure confirmed with present staff. Received instructions for my participation in the procedure from the performing physician.  

## 2020-01-24 NOTE — Progress Notes (Signed)
pt tolerated well. VSS. awake and to recovery. Report given to RN. Bite block used without trauma. 

## 2020-01-24 NOTE — Progress Notes (Signed)
SP vitals and NS IV. 

## 2020-01-24 NOTE — Progress Notes (Signed)
Patient insisted on dressing by himself.

## 2020-01-24 NOTE — Op Note (Addendum)
[place] Patient Name: Damante Spragg Procedure Date: 01/24/2020 9:59 AM MRN: [id number] Endoscopist: Ladene Artist , MD Age: 70 Referring MD:  Date of Birth: 1950-10-08 Gender: Male Account #: [id number] Procedure:                Upper GI endoscopy Indications:              Epigastric abdominal pain, Dyspepsia, Dysphagia,                            Heme positive stool Medicines:                Monitored Anesthesia Care Procedure:                Pre-Anesthesia Assessment:                           - Prior to the procedure, a History and Physical                            was performed, and patient medications and                            allergies were reviewed. The patient's tolerance of                            previous anesthesia was also reviewed. The risks                            and benefits of the procedure and the sedation                            options and risks were discussed with the patient.                            All questions were answered, and informed consent                            was obtained. Prior Anticoagulants: The patient has                            taken Plavix (clopidogrel), last dose was 3 days                            prior to procedure. ASA Grade Assessment: III - A                            patient with severe systemic disease. After                            reviewing the risks and benefits, the patient was                            deemed in satisfactory condition to undergo the  procedure.                           After obtaining informed consent, the endoscope was                            passed under direct vision. Throughout the                            procedure, the patient's blood pressure, pulse, and                            oxygen saturations were monitored continuously. The                            Endoscope was introduced through the mouth, and                             advanced to the second part of duodenum. The upper                            GI endoscopy was accomplished without difficulty.                            The patient tolerated the procedure well. Scope In: Scope Out: Findings:                 One benign-appearing, intrinsic mild stenosis was                            found at the gastroesophageal junction. This                            stenosis measured 1.4 cm (inner diameter) x less                            than one cm (in length). The stenosis was                            traversed. A guidewire was placed and the scope was                            withdrawn. Dilations were performed with Savary                            dilators with no resistance at 15 mm, 16 mm and 17                            mm.                           The exam of the esophagus was otherwise normal.                           Patchy mildly erythematous mucosa  without bleeding                            was found in the gastric body and in the gastric                            antrum. Biopsies were taken with a cold forceps for                            histology.                           A small hiatal hernia was present.                           The exam of the stomach was otherwise normal.                           The duodenal bulb and second portion of the                            duodenum were normal. Complications:            No immediate complications. Estimated Blood Loss:     Estimated blood loss was minimal. Impression:               - Benign-appearing esophageal stenosis. Dilated.                           - Erythematous mucosa in the gastric body and                            antrum. Biopsied.                           - Small hiatal hernia.                           - Normal duodenal bulb and second portion of the                            duodenum. Recommendation:           - Clear liquid diet for 2 hours, then advance as                             tolerated to soft diet today.                           - Resume prior diet tomorrow.                           - Closely follow antireflux measures long term.                           - Continue present medications.                           -  Take pantoprazole to 40 mg po bid.                           - Discontinue Mobic.                           - EC ASA qd ok as recommended by your physician                            however avoid any addtional ASA, NSAIDs, Cox-2                            medications.                           - Await pathology results.                           - Resume Plavix (clopidogrel) at prior dose                            tomorrow. Refer to managing physician for further                            adjustment of therapy. Meryl Dare, MD 01/24/2020 10:32:56 AM This report has been signed electronically.

## 2020-01-24 NOTE — Patient Instructions (Signed)
You may resume your previous amount of plavix tomorrow.  Do not use meloxicam. Avoid any further aspirin except your 80 mg.  Be sure to take your protonix twice daily on an empty stomach.  YOU HAD AN ENDOSCOPIC PROCEDURE TODAY AT [facility]:   Refer to the procedure report that was given to you for any specific questions about what was found during the examination.  If the procedure report does not answer your questions, please call your gastroenterologist to clarify.  If you requested that your care partner not be given the details of your procedure findings, then the procedure report has been included in a sealed envelope for you to review at your convenience later.  YOU SHOULD EXPECT: Some feelings of bloating in the abdomen. Passage of more gas than usual.  Walking can help get rid of the air that was put into your GI tract during the procedure and reduce the bloating. If you had a lower endoscopy (such as a colonoscopy or flexible sigmoidoscopy) you may notice spotting of blood in your stool or on the toilet paper. If you underwent a bowel prep for your procedure, you may not have a normal bowel movement for a few days.  Please Note:  You might notice some irritation and congestion in your nose or some drainage.  This is from the oxygen used during your procedure.  There is no need for concern and it should clear up in a day or so.  SYMPTOMS TO REPORT IMMEDIATELY:    Following upper endoscopy (EGD)  Vomiting of blood or coffee ground material  New chest pain or pain under the shoulder blades  Painful or persistently difficult swallowing  New shortness of breath  Fever of 100F or higher  Black, tarry-looking stools  For urgent or emergent issues, a gastroenterologist can be reached at any hour by calling [phone]. Do not use MyChart messaging for urgent concerns.    DIET:  We suggest clear liquids until 12:30 pm, then a soft diet for the rest of today.  You may  proceed to your regular diet tomorrow.  Drink plenty of fluids but you should avoid alcoholic beverages for 24 hours.  ACTIVITY:  You should plan to take it easy for the rest of today and you should NOT DRIVE or use heavy machinery until tomorrow (because of the sedation medicines used during the test).    FOLLOW UP: Our staff will call the number listed on your records 48-72 hours following your procedure to check on you and address any questions or concerns that you may have regarding the information given to you following your procedure. If we do not reach you, we will leave a message.  We will attempt to reach you two times.  During this call, we will ask if you have developed any symptoms of COVID 19. If you develop any symptoms (ie: fever, flu-like symptoms, shortness of breath, cough etc.) before then, please call [phone].  If you test positive for Covid 19 in the 2 weeks post procedure, please call and report this information to [place].    If any biopsies were taken you will be contacted by phone or by letter within the next 1-3 weeks.  Please call us at [phone] if you have not heard about the biopsies in 3 weeks.    SIGNATURES/CONFIDENTIALITY: You and/or your care partner have signed paperwork which will be entered into your electronic medical record.  These signatures attest to the fact that  that the information above on your After Visit Summary has been reviewed and is understood.  Full responsibility of the confidentiality of this discharge information lies with you and/or your care-partner.

## 2020-01-26 ENCOUNTER — Telehealth: Payer: Self-pay

## 2020-01-26 NOTE — Telephone Encounter (Signed)
  Follow up Call-  Call back number 01/24/2020  Post procedure Call Back phone  # [phone]  Permission to leave phone message Yes  Some recent data might be hidden     Patient questions:  Do you have a fever, pain , or abdominal swelling? No. Pain Score  0 *  Have you tolerated food without any problems? Yes.    Have you been able to return to your normal activities? Yes.    Do you have any questions about your discharge instructions: Diet   No. Medications  No. Follow up visit  No.  Do you have questions or concerns about your Care? No.  Actions: * If pain score is 4 or above: No action needed, pain <4.  1. Have you developed a fever since your procedure? no  2.   Have you had an respiratory symptoms (SOB or cough) since your procedure? no  3.   Have you tested positive for COVID 19 since your procedure no  4.   Have you had any family members/close contacts diagnosed with the COVID 19 since your procedure?  no   If yes to any of these questions please route to Joylene John, RN and Joella Prince, RN

## 2020-01-27 DIAGNOSIS — M461 Sacroiliitis, not elsewhere classified: Secondary | ICD-10-CM | POA: Insufficient documentation

## 2020-01-27 HISTORY — DX: Sacroiliitis, not elsewhere classified: M46.1

## 2020-01-30 ENCOUNTER — Encounter: Payer: Self-pay | Admitting: Gastroenterology

## 2020-03-08 DIAGNOSIS — L97509 Non-pressure chronic ulcer of other part of unspecified foot with unspecified severity: Secondary | ICD-10-CM | POA: Insufficient documentation

## 2020-03-08 DIAGNOSIS — G709 Myoneural disorder, unspecified: Secondary | ICD-10-CM | POA: Insufficient documentation

## 2020-03-08 DIAGNOSIS — I10 Essential (primary) hypertension: Secondary | ICD-10-CM | POA: Insufficient documentation

## 2020-03-08 DIAGNOSIS — M5126 Other intervertebral disc displacement, lumbar region: Secondary | ICD-10-CM | POA: Insufficient documentation

## 2020-03-08 DIAGNOSIS — K219 Gastro-esophageal reflux disease without esophagitis: Secondary | ICD-10-CM | POA: Insufficient documentation

## 2020-03-08 DIAGNOSIS — Z973 Presence of spectacles and contact lenses: Secondary | ICD-10-CM | POA: Insufficient documentation

## 2020-03-09 ENCOUNTER — Ambulatory Visit (INDEPENDENT_AMBULATORY_CARE_PROVIDER_SITE_OTHER): Payer: Medicare Other | Admitting: Cardiology

## 2020-03-09 ENCOUNTER — Encounter: Payer: Self-pay | Admitting: Cardiology

## 2020-03-09 ENCOUNTER — Other Ambulatory Visit: Payer: Self-pay

## 2020-03-09 VITALS — BP 148/74 | HR 74 | Ht 71.0 in | Wt 307.0 lb

## 2020-03-09 DIAGNOSIS — I10 Essential (primary) hypertension: Secondary | ICD-10-CM

## 2020-03-09 DIAGNOSIS — E782 Mixed hyperlipidemia: Secondary | ICD-10-CM | POA: Diagnosis not present

## 2020-03-09 DIAGNOSIS — I2510 Atherosclerotic heart disease of native coronary artery without angina pectoris: Secondary | ICD-10-CM | POA: Diagnosis not present

## 2020-03-09 DIAGNOSIS — I77810 Thoracic aortic ectasia: Secondary | ICD-10-CM | POA: Diagnosis not present

## 2020-03-09 DIAGNOSIS — R931 Abnormal findings on diagnostic imaging of heart and coronary circulation: Secondary | ICD-10-CM

## 2020-03-09 MED ORDER — ASPIRIN EC 81 MG PO TBEC: 81.0000 mg | DELAYED_RELEASE_TABLET | Freq: Every day | ORAL | 3 refills | Status: AC

## 2020-03-09 NOTE — Progress Notes (Signed)
Cardiology Office Note:    Date:  03/09/2020   ID:  Stephen Medina, DOB 09-Jul-1950, MRN [id number]  PCP:  Sharilyn Sites, MD  Cardiologist:  Jenean Lindau, MD   Referring MD: Sharilyn Sites, MD    ASSESSMENT:    1. Coronary artery disease involving native coronary artery of native heart without angina pectoris   2. Essential (primary) hypertension   3. Ascending aorta dilatation (HCC)   4. Mixed hyperlipidemia   5. Abnormal findings on diagnostic imaging of heart and coronary circulation     PLAN:    In order of problems listed above:  1. Coronary artery disease: Secondary prevention stressed with the patient.  Importance of compliance with diet medication stressed any vocalized understanding.  I mentioned to him to start walking on a regular basis. Ascending aortic dilatation: Stable.  He will have a CT scan to reassess this.   Mixed dyslipidemia: Lipids were reviewed and diet was emphasized.  He will be back in the next few days for complete blood work including fasting lipids Essential hypertension: Blood pressure stable and diet was emphasized.  He tells me that his blood pressure is much better at home.  Lifestyle modification urged. Morbid obesity: Diet was emphasized.  He has not done well with his dietary emphasis.  I revisited this issue.  Risks of obesity explained and he promises to do better. Patient will be seen in follow-up appointment in 6 months or earlier if the patient has any concerns    Medication Adjustments/Labs and Tests Ordered: Current medicines are reviewed at length with the patient today.  Concerns regarding medicines are outlined above.  Orders Placed This Encounter  Procedures  . CT Chest Wo Contrast  . Basic metabolic panel  . CBC with Differential/Platelet  . Hepatic function panel  . Lipid panel  . TSH   Meds ordered this encounter  Medications  . aspirin EC 81 MG tablet    Sig: Take 1 tablet (81 mg total) by mouth daily. Swallow whole.     Dispense:  90 tablet    Refill:  3     No chief complaint on file.    History of Present Illness:    Stephen Medina is a 70 y.o. male.  Patient has past medical history of atherosclerotic vascular disease, coronary artery disease, ascending aortic dilatation, essential hypertension and dyslipidemia.  He is morbidly obese.  He is undergone surgery on his lower extremity and is doing well.  No chest pain orthopnea or PND.  He leads a very sedentary lifestyle.  At the time of my evaluation, the patient is alert awake oriented and in no distress.  Past Medical History:  Diagnosis Date  . Ankle arthritis 10/20/2018  . Ascending aorta dilatation ([place]) 06/01/2019  . CAD (coronary artery disease) 08/01/2013   Cath 2005  normal Left main, scattered irregularities LAD, 80% stenosis ostial Diag 1, scattered irregularities CFX, scattered irregularities RCA  Taxus stent March 2005 Diagonal   . Disc displacement, lumbar   . Disorder of ankle joint 10/20/2018  . Essential hypertension 02/02/2014  . Foot ulceration ([place])    left  . GERD (gastroesophageal reflux disease)   . History of heart artery stent 05/14/2002  . History of ulcer disease 10/19/2013  . HNP (herniated nucleus pulposus), lumbar 09/03/2015  . Hyperlipidemia   . Hypertension   . Lumbar spondylosis 04/01/2013  . Mixed dyslipidemia 11/27/2017  . Morbid obesity ([place])   . Neuromuscular disorder ([place])  neuropathy left foot   . Nonhealing ulcer of left lower extremity ([place]) 04/16/2015  . OA (osteoarthritis) of hip 06/08/2019  . Pain in right knee 05/28/2017  . Preop cardiovascular exam 06/01/2019  . Primary localized osteoarthrosis of ankle and foot 06/30/2018  . Primary osteoarthritis of right hip 06/08/2019  . Ulcer of left foot ([place]) 12/04/2014  . Ulcer of toe of left foot ([place]) 04/16/2015  . Wears glasses     Past Surgical History:  Procedure Laterality Date  . APPENDECTOMY    . BACK SURGERY    . BONE EXOSTOSIS EXCISION Left  01/28/2018   Procedure: Exostectomy left first metatarsal base and medial cuneiform;  Surgeon: Wylene Simmer, MD;  Location: [facility];  Service: Orthopedics;  Laterality: Left;  31min  . CARDIAC CATHETERIZATION     05    1 stent   dr Wynonia Lawman  . COLONOSCOPY    . ESOPHAGOGASTRODUODENOSCOPY N/A 08/02/2013   Procedure: ESOPHAGOGASTRODUODENOSCOPY (EGD);  Surgeon: Ladene Artist, MD;  Location: [facility];  Service: Endoscopy;  Laterality: N/A;  . FOOT ARTHRODESIS Left 05/22/2016   Procedure: Left Subtalar and Talonavicular Joint Arthrodesis;  Surgeon: Wylene Simmer, MD;  Location: [facility];  Service: Orthopedics;  Laterality: Left;  . FOOT SURGERY    . GASTROCNEMIUS RECESSION Left 05/22/2016   Procedure: Left Gastroc Recession;  Surgeon: Wylene Simmer, MD;  Location: [facility];  Service: Orthopedics;  Laterality: Left;  Marland Kitchen GRAFT APPLICATION N/A 01/18/1094   Procedure: INTEGRA GRAFT APPLICATION;  Surgeon: Trula Slade, DPM;  Location: [facility];  Service: Podiatry;  Laterality: N/A;  . LEFT HEART CATHETERIZATION WITH CORONARY ANGIOGRAM N/A 02/09/2014   Procedure: LEFT HEART CATHETERIZATION WITH CORONARY ANGIOGRAM;  Surgeon: Peter M Martinique, MD;  Location: [facility];  Service: Cardiovascular;  Laterality: N/A;  . LUMBAR LAMINECTOMY     x2  . LUMBAR LAMINECTOMY/DECOMPRESSION MICRODISCECTOMY Left 09/03/2015   Procedure: MICRODISCECTOMY L5-S1 LEFT;  Surgeon: Ashok Pall, MD;  Location: [facility];  Service: Neurosurgery;  Laterality: Left;   MICRODISCECTOMY L5-S1 LEFT  . LUMBAR LAMINECTOMY/DECOMPRESSION MICRODISCECTOMY Left 02/13/2016   Procedure: MICRODISCECTOMY LUMBAR FIVE- SACRAL ONE LEFT;  Surgeon: Ashok Pall, MD;  Location: [facility];  Service: Neurosurgery;  Laterality: Left;  . TONSILLECTOMY    . TOTAL HIP ARTHROPLASTY Right 06/08/2019   Procedure: TOTAL HIP ARTHROPLASTY ANTERIOR APPROACH;  Surgeon: Gaynelle Arabian, MD;  Location: [facility];  Service: Orthopedics;  Laterality: Right;  172min  . WOUND DEBRIDEMENT N/A 06/22/2015   Procedure: DEBRIDEMENT WOUND;  Surgeon: Trula Slade, DPM;  Location: [facility];  Service: Podiatry;  Laterality: N/A;    Current Medications: Current Meds  Medication Sig  . acetaminophen (TYLENOL) 325 MG tablet Take 650 mg by mouth every 4 (four) hours as needed for pain.  Marland Kitchen aspirin EC 81 MG tablet Take 1 tablet (81 mg total) by mouth daily. Swallow whole.  . Cholecalciferol (VITAMIN D-3) 125 MCG (5000 UT) TABS Take 500 mg by mouth daily.  . clopidogrel (PLAVIX) 75 MG tablet TAKE 1 TABLET BY MOUTH EVERY DAY  . ezetimibe (ZETIA) 10 MG tablet TAKE 1 TABLET BY MOUTH DAILY  . meloxicam (MOBIC) 15 MG tablet Take 15 mg by mouth daily.   . metoprolol succinate (TOPROL-XL) 25 MG 24 hr tablet TAKE 1 TABLET BY MOUTH EVERY DAY  . nitroGLYCERIN (NITROSTAT) 0.4 MG SL tablet Place 0.4 mg under the tongue every 5 (five) minutes as needed for  chest pain.  . pantoprazole (PROTONIX) 40 MG tablet Take 1 tablet (40 mg total) by mouth 2 (two) times daily.  . rosuvastatin (CRESTOR) 40 MG tablet TAKE 1/2 TABLET BY MOUTH EVERY DAY  . zinc gluconate 50 MG tablet Take 50 mg by mouth daily.  . [DISCONTINUED] aspirin 325 MG EC tablet Take 1 tablet (325 mg total) by mouth daily.     Allergies:   Adhesive [tape], Latex, and Other   Social History   Socioeconomic History  . Marital status: Married    Spouse name: Not on file  . Number of children: Not on file  . Years of education: Not on file  . Highest education level: Not on file  Occupational History  . Not on file  Tobacco Use  . Smoking status: Never Smoker  . Smokeless tobacco: Former Systems developer    Types: Secondary school teacher  . Vaping Use: Never used  Substance and Sexual Activity  . Alcohol use: No    Alcohol/week: 1.0 standard drink    Types: 1 Glasses of wine per week  . Drug use: No  . Sexual activity: Not Currently  Other Topics  Concern  . Not on file  Social History Narrative   Pt lives in [place] with his wife. They have one son who does not live in the area. Pt previously worked for [employer] but is retired. He used to [employer]. He has never smoke and drinks 4-5 alcoholic beverages per week. No illicit drug use.   Social Determinants of Health   Financial Resource Strain: Not on file  Food Insecurity: Not on file  Transportation Needs: Not on file  Physical Activity: Not on file  Stress: Not on file  Social Connections: Not on file     Family History: The patient's family history includes CAD in his cousin; Cancer in his brother; Coronary artery disease in his father. There is no history of Colon cancer, Pancreatic cancer, Rectal cancer, or Stomach cancer.  ROS:   Please see the history of present illness.    All other systems reviewed and are negative.  EKGs/Labs/Other Studies Reviewed:    The following studies were reviewed today: I discussed my findings with the patient at length.   Recent Labs: 05/26/2019: ALT 20 06/09/2019: BUN 12; Creatinine, Ser 0.82; Hemoglobin 11.7; Platelets 181; Potassium 4.2; Sodium 135  Recent Lipid Panel    Component Value Date/Time   CHOL 179 11/30/2017 1420   TRIG 121 11/30/2017 1420   HDL 52 11/30/2017 1420   CHOLHDL 3.4 11/30/2017 1420   LDLCALC 103 (H) 11/30/2017 1420    Physical Exam:    VS:  BP (!) 148/74   Pulse 74   Ht 5\' 11"  (1.803 m)   Wt (!) 307 lb (139.3 kg)   SpO2 95%   BMI 42.82 kg/m     Wt Readings from Last 3 Encounters:  03/09/20 (!) 307 lb (139.3 kg)  01/24/20 300 lb (136.1 kg)  12/13/19 300 lb (136.1 kg)     GEN: Patient is in no acute distress HEENT: Normal NECK: No JVD; No carotid bruits LYMPHATICS: No lymphadenopathy CARDIAC: Hear sounds regular, 2/6 systolic murmur at the apex. RESPIRATORY:  Clear to auscultation without rales, wheezing or rhonchi  ABDOMEN: Soft, non-tender,  non-distended MUSCULOSKELETAL:  No edema; No deformity  SKIN: Warm and dry NEUROLOGIC:  Alert and oriented x 3 PSYCHIATRIC:  Normal affect   Signed, Jenean Lindau, MD  03/09/2020 4:44 PM  [facility]

## 2020-03-09 NOTE — Patient Instructions (Signed)
Medication Instructions:  Your physician has recommended you make the following change in your medication:   Take 81 mg coated aspirin daily.   *If you need a refill on your cardiac medications before your next appointment, please call your pharmacy*   Lab Work: Your physician recommends that you return for lab work in: next few days  You need to have labs done when you are fasting.  You can come Monday through Friday 8:30 am to 12:00 pm and 1:15 to 4:30. You do not need to make an appointment as the order has already been placed. The labs you are going to have done are BMET, CBC, TSH, LFT and Lipids.  If you have labs (blood work) drawn today and your tests are completely normal, you will receive your results only by: Marland Kitchen MyChart Message (if you have MyChart) OR . A paper copy in the mail If you have any lab test that is abnormal or we need to change your treatment, we will call you to review the results.   Testing/Procedures: Non-Cardiac CT scanning, (CAT scanning), is a noninvasive, special x-ray that produces cross-sectional images of the body using x-rays and a computer. CT scans help physicians diagnose and treat medical conditions. For some CT exams, a contrast material is used to enhance visibility in the area of the body being studied. CT scans provide greater clarity and reveal more details than regular x-ray exams.   Follow-Up: At [facility], you and your health needs are our priority.  As part of our continuing mission to provide you with exceptional heart care, we have created designated Provider Care Teams.  These Care Teams include your primary Cardiologist (physician) and Advanced Practice Providers (APPs -  Physician Assistants and Nurse Practitioners) who all work together to provide you with the care you need, when you need it.  We recommend signing up for the patient portal called "MyChart".  Sign up information is provided on this After Visit Summary.  MyChart is  used to connect with patients for Virtual Visits (Telemedicine).  Patients are able to view lab/test results, encounter notes, upcoming appointments, etc.  Non-urgent messages can be sent to your provider as well.   To learn more about what you can do with MyChart, go to [email or web address].    Your next appointment:   6 month(s)  The format for your next appointment:   In Person  Provider:   Jyl Heinz, MD   Other Instructions NA

## 2020-03-15 ENCOUNTER — Other Ambulatory Visit: Payer: Self-pay

## 2020-03-15 ENCOUNTER — Ambulatory Visit (HOSPITAL_BASED_OUTPATIENT_CLINIC_OR_DEPARTMENT_OTHER)
Admission: RE | Admit: 2020-03-15 | Discharge: 2020-03-15 | Disposition: A | Discharge status: Home / Self Care | Payer: Medicare Other | Source: Ambulatory Visit | Attending: Cardiology | Admitting: Cardiology

## 2020-03-15 DIAGNOSIS — I77810 Thoracic aortic ectasia: Secondary | ICD-10-CM | POA: Diagnosis present

## 2020-03-15 DIAGNOSIS — R931 Abnormal findings on diagnostic imaging of heart and coronary circulation: Secondary | ICD-10-CM | POA: Diagnosis present

## 2020-03-16 LAB — CBC WITH DIFFERENTIAL/PLATELET
Basophils Absolute: 0.1 10*3/uL (ref 0.0–0.2)
Basos: 1 %
EOS (ABSOLUTE): 0.1 10*3/uL (ref 0.0–0.4)
Eos: 1 %
Hematocrit: 44.2 % (ref 37.5–51.0)
Hemoglobin: 14.9 g/dL (ref 13.0–17.7)
Immature Grans (Abs): 0 10*3/uL (ref 0.0–0.1)
Immature Granulocytes: 0 %
Lymphocytes Absolute: 2.9 10*3/uL (ref 0.7–3.1)
Lymphs: 43 %
MCH: 30.3 pg (ref 26.6–33.0)
MCHC: 33.7 g/dL (ref 31.5–35.7)
MCV: 90 fL (ref 79–97)
Monocytes Absolute: 0.7 10*3/uL (ref 0.1–0.9)
Monocytes: 9 %
Neutrophils Absolute: 3.2 10*3/uL (ref 1.4–7.0)
Neutrophils: 46 %
Platelets: 229 10*3/uL (ref 150–450)
RBC: 4.91 x10E6/uL (ref 4.14–5.80)
RDW: 13.6 % (ref 11.6–15.4)
WBC: 6.9 10*3/uL (ref 3.4–10.8)

## 2020-03-16 LAB — LIPID PANEL
Chol/HDL Ratio: 2.8 ratio (ref 0.0–5.0)
Cholesterol, Total: 173 mg/dL (ref 100–199)
HDL: 62 mg/dL (ref >39)
LDL Chol Calc (NIH): 98 mg/dL (ref 0–99)
Triglycerides: 68 mg/dL (ref 0–149)
VLDL Cholesterol Cal: 13 mg/dL (ref 5–40)

## 2020-03-16 LAB — BASIC METABOLIC PANEL
BUN/Creatinine Ratio: 15 (ref 10–24)
BUN: 14 mg/dL (ref 8–27)
CO2: 18 mmol/L — ABNORMAL LOW (ref 20–29)
Calcium: 9.1 mg/dL (ref 8.6–10.2)
Chloride: 104 mmol/L (ref 96–106)
Creatinine, Ser: 0.93 mg/dL (ref 0.76–1.27)
Glucose: 93 mg/dL (ref 65–99)
Potassium: 4.4 mmol/L (ref 3.5–5.2)
Sodium: 142 mmol/L (ref 134–144)
eGFR: 90 mL/min/{1.73_m2} (ref >59)

## 2020-03-16 LAB — HEPATIC FUNCTION PANEL
ALT: 20 IU/L (ref 0–44)
AST: 22 IU/L (ref 0–40)
Albumin: 4.5 g/dL (ref 3.8–4.8)
Alkaline Phosphatase: 82 IU/L (ref 44–121)
Bilirubin Total: 0.6 mg/dL (ref 0.0–1.2)
Bilirubin, Direct: 0.18 mg/dL (ref 0.00–0.40)
Total Protein: 6.9 g/dL (ref 6.0–8.5)

## 2020-03-16 LAB — TSH: TSH: 1.86 u[IU]/mL (ref 0.450–4.500)

## 2020-04-19 ENCOUNTER — Other Ambulatory Visit: Payer: Self-pay | Admitting: Physician Assistant

## 2020-04-19 DIAGNOSIS — M25562 Pain in left knee: Secondary | ICD-10-CM

## 2020-04-24 ENCOUNTER — Ambulatory Visit
Admission: RE | Admit: 2020-04-24 | Discharge: 2020-04-24 | Disposition: A | Discharge status: Home / Self Care | Payer: Medicare Other | Source: Ambulatory Visit | Attending: Physician Assistant | Admitting: Physician Assistant

## 2020-04-24 ENCOUNTER — Other Ambulatory Visit: Payer: Self-pay

## 2020-04-24 DIAGNOSIS — M25562 Pain in left knee: Secondary | ICD-10-CM

## 2020-07-13 ENCOUNTER — Telehealth: Payer: Self-pay

## 2020-07-13 NOTE — Telephone Encounter (Signed)
Stephen Medina 70 year old male is requesting preoperative cardiac evaluation for left knee arthroscopy.  He was last seen in the clinic on 02/18/2020.  During that time importance of diet and medication compliance were discussed.  It was recommended that he walk on a regular basis.  During the time of evaluation he was awake, alert, oriented, and in no acute distress.  A CT chest was ordered and showed a stable asending aortic aneurysm measuring 4.3 cm.  Follow-up was planned for 6 months.  His PMH includes mixed hyperlipidemia, essential hypertension, coronary artery disease status post cardiac catheterization 2005 which showed scattered irregularities left anterior descending, 80% stenosed ostial diagonal 1, scattered irregular CFX, scattered irregularities RCA.  He received PCI with stenting to his diagonal.  He underwent stress testing 06/03/2019 which showed no ST segment deviation, 58% EF and was considered low risk.  May his Plavix be held prior to his surgery?  Thank you for your help.Please direct response to [facility] preop pool.    Jossie Ng. Pritika Alvarez NP-C    07/13/2020, 5:04 PM [place] [address] Office [phone] Fax [phone]

## 2020-07-13 NOTE — Telephone Encounter (Signed)
   [facility] Pre-operative Risk Assessment    Patient Name: Stephen Medina  DOB: Sep 12, 1950  MRN: [id number]   HEARTCARE STAFF: - Please ensure there is not already an duplicate clearance open for this procedure. - Under Visit Info/Reason for Call, type in Other and utilize the format Clearance MM/DD/YY or Clearance TBD. Do not use dashes or single digits. - If request is for dental extraction, please clarify the # of teeth to be extracted. - If the patient is currently at the dentist's office, call Pre-Op APP to address. If the patient is not currently in the dentist office, please route to the Pre-Op pool  Request for surgical clearance:  What type of surgery is being performed? Left knee arthroscopy    When is this surgery scheduled? 07/25/20   What type of clearance is required (medical clearance vs. Pharmacy clearance to hold med vs. Both)? Both  Are there any medications that need to be held prior to surgery and how long?Plavix   Practice name and name of physician performing surgery? [facility]- Dr. Pilar Plate Aluisio   What is the office phone number? [phone]   7.   What is the office fax number? [phone] Attention Claiborne Billings Haithcock  8.   Anesthesia type (None, local, MAC, general) ? Choice   Ferol Laiche Tressa Busman 07/13/2020, 4:51 PM  _________________________________________________________________   (provider comments below)

## 2020-07-17 NOTE — Telephone Encounter (Signed)
   Primary Cardiologist: Jenean Lindau, MD  Chart reviewed as part of pre-operative protocol coverage. Given past medical history and time since last visit, based on ACC/AHA guidelines, Stephen Medina would be at acceptable risk for the planned procedure without further cardiovascular testing.   His Plavix may be held for 5-7 days prior to his procedure.  Please resume as soon as hemostasis is achieved.  Patient was advised that if he develops new symptoms prior to surgery to contact our office to arrange a follow-up appointment.  He verbalized understanding.  I will route this recommendation to the requesting party via Epic fax function and remove from pre-op pool.  Please call with questions.  Jossie Ng. Rosbel Buckner NP-C    07/17/2020, 8:41 AM [place] [address] Office [phone] Fax [phone]

## 2020-07-23 ENCOUNTER — Other Ambulatory Visit: Payer: Self-pay | Admitting: Cardiology

## 2020-08-08 ENCOUNTER — Other Ambulatory Visit: Payer: Self-pay | Admitting: Cardiology

## 2020-09-03 ENCOUNTER — Ambulatory Visit (INDEPENDENT_AMBULATORY_CARE_PROVIDER_SITE_OTHER): Payer: Medicare Other | Admitting: Cardiology

## 2020-09-03 ENCOUNTER — Other Ambulatory Visit: Payer: Self-pay

## 2020-09-03 ENCOUNTER — Encounter: Payer: Self-pay | Admitting: Cardiology

## 2020-09-03 VITALS — BP 100/66 | HR 63 | Ht 72.0 in | Wt 317.1 lb

## 2020-09-03 DIAGNOSIS — I10 Essential (primary) hypertension: Secondary | ICD-10-CM

## 2020-09-03 DIAGNOSIS — E782 Mixed hyperlipidemia: Secondary | ICD-10-CM

## 2020-09-03 DIAGNOSIS — I2510 Atherosclerotic heart disease of native coronary artery without angina pectoris: Secondary | ICD-10-CM | POA: Diagnosis not present

## 2020-09-03 NOTE — Patient Instructions (Signed)

## 2020-09-03 NOTE — Progress Notes (Signed)
Cardiology Office Note:    Date:  09/03/2020   ID:  Stephen Medina, DOB 12/08/1950, MRN [id number]  PCP:  Sharilyn Sites, MD  Cardiologist:  Jenean Lindau, MD   Referring MD: Sharilyn Sites, MD    ASSESSMENT:    1. Coronary artery disease involving native coronary artery of native heart without angina pectoris   2. Essential (primary) hypertension   3. Morbid obesity ([place])   4. Mixed dyslipidemia    PLAN:    In order of problems listed above:  Coronary artery disease: Secondary prevention stressed to the patient.  Importance of compliance with diet medication stressed any vocalized understanding. Essential hypertension: Blood pressure stable and diet was emphasized. Ascending aortic aneurysm: CT scan reports a 43 mm diameter of the ascending aorta and will monitor this on an annual basis.  I discussed this with the patient at length. Mixed dyslipidemia and morbid obesity: Lifestyle modification urged and he promises to do better.  Risks of obesity explained. Patient will be seen in follow-up appointment in 6 months or earlier if the patient has any concerns    Medication Adjustments/Labs and Tests Ordered: Current medicines are reviewed at length with the patient today.  Concerns regarding medicines are outlined above.  Orders Placed This Encounter  Procedures   Basic metabolic panel   CBC with Differential/Platelet   Hepatic function panel   Lipid panel   TSH   EKG 12-Lead   No orders of the defined types were placed in this encounter.    No chief complaint on file.    History of Present Illness:    Stephen Medina is a 70 y.o. male.  Patient has past medical history of coronary artery disease, essential hypertension, dyslipidemia and morbid obesity.  He denies any problems at this time and takes care of activities of daily living.  No chest pain orthopnea or PND.  At the time of my evaluation, the patient is alert awake oriented and in no distress.  He leads a  sedentary lifestyle because of orthopedic issues.  Past Medical History:  Diagnosis Date   Ankle arthritis 10/20/2018   Ascending aorta dilatation ([place]) 06/01/2019   CAD (coronary artery disease) 08/01/2013   Cath 2005  normal Left main, scattered irregularities LAD, 80% stenosis ostial Diag 1, scattered irregularities CFX, scattered irregularities RCA  Taxus stent March 2005 Diagonal    Disc displacement, lumbar    Disorder of ankle joint 10/20/2018   Essential hypertension 02/02/2014   Foot ulceration ([place])    left   GERD (gastroesophageal reflux disease)    History of heart artery stent 05/14/2002   History of ulcer disease 10/19/2013   HNP (herniated nucleus pulposus), lumbar 09/03/2015   Hyperlipidemia    Hypertension    Lumbar spondylosis 04/01/2013   Mixed dyslipidemia 11/27/2017   Morbid obesity ([place])    Neuromuscular disorder ([place])    neuropathy left foot    Nonhealing ulcer of left lower extremity ([place]) 04/16/2015   OA (osteoarthritis) of hip 06/08/2019   Pain in right knee 05/28/2017   Preop cardiovascular exam 06/01/2019   Primary localized osteoarthrosis of ankle and foot 06/30/2018   Primary osteoarthritis of right hip 06/08/2019   Ulcer of left foot ([place]) 12/04/2014   Ulcer of toe of left foot ([employer]) 04/16/2015   Wears glasses     Past Surgical History:  Procedure Laterality Date   APPENDECTOMY     BACK SURGERY     BONE EXOSTOSIS EXCISION Left 01/28/2018  Procedure: Exostectomy left first metatarsal base and medial cuneiform;  Surgeon: Wylene Simmer, MD;  Location: [place];  Service: Orthopedics;  Laterality: Left;  41mn   CARDIAC CATHETERIZATION     05    1 stent   dr tWynonia Lawman  COLONOSCOPY     ESOPHAGOGASTRODUODENOSCOPY N/A 08/02/2013   Procedure: ESOPHAGOGASTRODUODENOSCOPY (EGD);  Surgeon: MLadene Artist MD;  [facility];  Service: Endoscopy;  Laterality: N/A;   FOOT ARTHRODESIS Left 05/22/2016   Procedure: Left Subtalar and Talonavicular Joint  Arthrodesis;  Surgeon: HWylene Simmer MD;  [place] [facility]  Service: Orthopedics;  Laterality: Left;   FOOT SURGERY     GASTROCNEMIUS RECESSION Left 05/22/2016   Procedure: Left Gastroc Recession;  Surgeon: HWylene Simmer MD;  [place]  Service: Orthopedics;  Laterality: Left;   GRAFT APPLICATION N/A [id number]  Procedure: INTEGRA GRAFT APPLICATION;  Surgeon: MTrula Slade DPM;  [place] [facility]  Service: Podiatry;  Laterality: N/A;   LEFT HEART CATHETERIZATION WITH CORONARY ANGIOGRAM N/A 02/09/2014   Procedure: LEFT HEART CATHETERIZATION WITH CORONARY ANGIOGRAM;  Surgeon: Peter M JMartinique MD;  [facility];  Service: Cardiovascular;  Laterality: N/A;   LUMBAR LAMINECTOMY     x2   LUMBAR LAMINECTOMY/DECOMPRESSION MICRODISCECTOMY Left 09/03/2015   Procedure: MICRODISCECTOMY L5-S1 LEFT;  Surgeon: KAshok Pall MD;  [facility];  Service: Neurosurgery;  Laterality: Left;   MICRODISCECTOMY L5-S1 LEFT   LUMBAR LAMINECTOMY/DECOMPRESSION MICRODISCECTOMY Left 02/13/2016   Procedure: MICRODISCECTOMY LUMBAR FIVE- SACRAL ONE LEFT;  Surgeon: KAshok Pall MD;  [place] [facility]  Service: Neurosurgery;  Laterality: Left;   TONSILLECTOMY     TOTAL HIP ARTHROPLASTY Right 06/08/2019   Procedure: TOTAL HIP ARTHROPLASTY ANTERIOR APPROACH;  Surgeon: AGaynelle Arabian MD;  [facility];  Service: Orthopedics;  Laterality: Right;  1069m   WOUND DEBRIDEMENT N/A 06/22/2015   Procedure: DEBRIDEMENT WOUND;  Surgeon: MaTrula SladeDPM;  [facility] Service: [facility];  Laterality: N/A;    Current Medications: Current Meds  Medication Sig   acetaminophen (TYLENOL) 325 MG tablet Take 650 mg by mouth every 4 (four) hours as needed for pain.   aspirin EC 81 MG tablet Take 1 tablet (81 mg total) by mouth daily. Swallow whole.   Cholecalciferol (VITAMIN D-3) 125 MCG (5000 UT) TABS Take 500 mg by mouth daily.    clopidogrel (PLAVIX) 75 MG tablet TAKE 1 TABLET BY MOUTH EVERY DAY   ezetimibe (ZETIA) 10 MG tablet TAKE 1 TABLET BY MOUTH DAILY   meloxicam (MOBIC) 15 MG tablet Take 15 mg by mouth daily.    metoprolol succinate (TOPROL-XL) 25 MG 24 hr tablet TAKE 1 TABLET BY MOUTH EVERY DAY   nitroGLYCERIN (NITROSTAT) 0.4 MG SL tablet Place 0.4 mg under the tongue every 5 (five) minutes as needed for chest pain.   pantoprazole (PROTONIX) 40 MG tablet Take 1 tablet (40 mg total) by mouth 2 (two) times daily.   rosuvastatin (CRESTOR) 40 MG tablet TAKE 1/2 TABLET BY MOUTH EVERY DAY   zinc gluconate 50 MG tablet Take 50 mg by mouth daily.     Allergies:   Adhesive [tape], Latex, and Other   Social History   Socioeconomic History   Marital status: Married    Spouse name: Not on file   Number of children: Not on file   Years of education: Not on file   Highest education level: Not on file  Occupational History   Not on file  Tobacco Use   Smoking status: Never   Smokeless tobacco: Former    Types: Nurse, children's Use: Never used  Substance and Sexual Activity   Alcohol use: No    Alcohol/week: 1.0 standard drink    Types: 1 Glasses of wine per week   Drug use: No   Sexual activity: Not Currently  Other Topics Concern   Not on file  Social History Narrative   Pt lives in [place] with his wife. They have one son who does not live in the area. Pt previously worked for [employer] but is retired. He used to [employer]. He has never smoke and drinks 4-5 alcoholic beverages per week. No illicit drug use.   Social Determinants of Health   Financial Resource Strain: Not on file  Food Insecurity: Not on file  Transportation Needs: Not on file  Physical Activity: Not on file  Stress: Not on file  Social Connections: Not on file     Family History: The patient's family history includes CAD in his cousin; Cancer in his brother; Coronary artery disease in his father.  There is no history of Colon cancer, Pancreatic cancer, Rectal cancer, or Stomach cancer.  ROS:   Please see the history of present illness.    All other systems reviewed and are negative.  EKGs/Labs/Other Studies Reviewed:    The following studies were reviewed today: I discussed my findings with the patient at length including CT scan report   Recent Labs: 03/15/2020: ALT 20; BUN 14; Creatinine, Ser 0.93; Hemoglobin 14.9; Platelets 229; Potassium 4.4; Sodium 142; TSH 1.860  Recent Lipid Panel    Component Value Date/Time   CHOL 173 03/15/2020 1308   TRIG 68 03/15/2020 1308   HDL 62 03/15/2020 1308   CHOLHDL 2.8 03/15/2020 1308   LDLCALC 98 03/15/2020 1308    Physical Exam:    VS:  BP 100/66   Pulse 63   Ht 6' (1.829 m)   Wt (!) 317 lb 1.3 oz (143.8 kg)   BMI 43.00 kg/m     Wt Readings from Last 3 Encounters:  09/03/20 (!) 317 lb 1.3 oz (143.8 kg)  03/09/20 (!) 307 lb (139.3 kg)  01/24/20 300 lb (136.1 kg)     GEN: Patient is in no acute distress HEENT: Normal NECK: No JVD; No carotid bruits LYMPHATICS: No lymphadenopathy CARDIAC: Hear sounds regular, 2/6 systolic murmur at the apex. RESPIRATORY:  Clear to auscultation without rales, wheezing or rhonchi  ABDOMEN: Soft, non-tender, non-distended MUSCULOSKELETAL:  No edema; No deformity  SKIN: Warm and dry NEUROLOGIC:  Alert and oriented x 3 PSYCHIATRIC:  Normal affect   Signed, Jenean Lindau, MD  09/03/2020 3:49 PM    [facility]

## 2020-09-13 ENCOUNTER — Other Ambulatory Visit: Payer: Self-pay

## 2020-09-13 ENCOUNTER — Other Ambulatory Visit: Payer: Self-pay | Admitting: Family Medicine

## 2020-09-13 ENCOUNTER — Ambulatory Visit (HOSPITAL_COMMUNITY)
Admission: RE | Admit: 2020-09-13 | Discharge: 2020-09-13 | Disposition: A | Discharge status: Home / Self Care | Payer: Medicare Other | Source: Ambulatory Visit | Attending: Family Medicine | Admitting: Family Medicine

## 2020-09-13 ENCOUNTER — Other Ambulatory Visit (HOSPITAL_COMMUNITY): Payer: Self-pay | Admitting: Family Medicine

## 2020-09-13 DIAGNOSIS — M79662 Pain in left lower leg: Secondary | ICD-10-CM

## 2020-09-20 ENCOUNTER — Encounter (INDEPENDENT_AMBULATORY_CARE_PROVIDER_SITE_OTHER): Payer: Medicare Other | Admitting: Ophthalmology

## 2020-09-20 ENCOUNTER — Other Ambulatory Visit: Payer: Self-pay

## 2020-09-20 DIAGNOSIS — D3132 Benign neoplasm of left choroid: Secondary | ICD-10-CM | POA: Diagnosis not present

## 2020-09-20 DIAGNOSIS — D3131 Benign neoplasm of right choroid: Secondary | ICD-10-CM | POA: Diagnosis not present

## 2020-09-20 DIAGNOSIS — H43813 Vitreous degeneration, bilateral: Secondary | ICD-10-CM

## 2020-09-20 DIAGNOSIS — H35033 Hypertensive retinopathy, bilateral: Secondary | ICD-10-CM | POA: Diagnosis not present

## 2020-09-20 DIAGNOSIS — I10 Essential (primary) hypertension: Secondary | ICD-10-CM

## 2020-09-21 ENCOUNTER — Ambulatory Visit (HOSPITAL_COMMUNITY): Payer: Medicare Other

## 2020-10-18 ENCOUNTER — Other Ambulatory Visit: Payer: Self-pay | Admitting: Cardiology

## 2020-10-18 NOTE — Telephone Encounter (Signed)
Rosuvastatin 40 mg tablets # 45 x 5 refills sent to  [facility], [place]

## 2020-10-20 LAB — HEPATIC FUNCTION PANEL
ALT: 21 IU/L (ref 0–44)
AST: 23 IU/L (ref 0–40)
Albumin: 4.5 g/dL (ref 3.8–4.8)
Alkaline Phosphatase: 91 IU/L (ref 44–121)
Bilirubin Total: 0.6 mg/dL (ref 0.0–1.2)
Bilirubin, Direct: 0.17 mg/dL (ref 0.00–0.40)
Total Protein: 6.7 g/dL (ref 6.0–8.5)

## 2020-10-20 LAB — BASIC METABOLIC PANEL
BUN/Creatinine Ratio: 10 (ref 10–24)
BUN: 8 mg/dL (ref 8–27)
CO2: 23 mmol/L (ref 20–29)
Calcium: 9.1 mg/dL (ref 8.6–10.2)
Chloride: 102 mmol/L (ref 96–106)
Creatinine, Ser: 0.83 mg/dL (ref 0.76–1.27)
Glucose: 97 mg/dL (ref 70–99)
Potassium: 4.8 mmol/L (ref 3.5–5.2)
Sodium: 139 mmol/L (ref 134–144)
eGFR: 95 mL/min/{1.73_m2} (ref >59)

## 2020-10-20 LAB — CBC WITH DIFFERENTIAL/PLATELET
Basophils Absolute: 0.1 10*3/uL (ref 0.0–0.2)
Basos: 1 %
EOS (ABSOLUTE): 0.2 10*3/uL (ref 0.0–0.4)
Eos: 3 %
Hematocrit: 43.4 % (ref 37.5–51.0)
Hemoglobin: 14.7 g/dL (ref 13.0–17.7)
Immature Grans (Abs): 0 10*3/uL (ref 0.0–0.1)
Immature Granulocytes: 0 %
Lymphocytes Absolute: 3 10*3/uL (ref 0.7–3.1)
Lymphs: 45 %
MCH: 30 pg (ref 26.6–33.0)
MCHC: 33.9 g/dL (ref 31.5–35.7)
MCV: 89 fL (ref 79–97)
Monocytes Absolute: 0.8 10*3/uL (ref 0.1–0.9)
Monocytes: 12 %
Neutrophils Absolute: 2.6 10*3/uL (ref 1.4–7.0)
Neutrophils: 39 %
Platelets: 254 10*3/uL (ref 150–450)
RBC: 4.9 x10E6/uL (ref 4.14–5.80)
RDW: 13.2 % (ref 11.6–15.4)
WBC: 6.6 10*3/uL (ref 3.4–10.8)

## 2020-10-20 LAB — LIPID PANEL
Chol/HDL Ratio: 2.8 ratio (ref 0.0–5.0)
Cholesterol, Total: 141 mg/dL (ref 100–199)
HDL: 51 mg/dL (ref >39)
LDL Chol Calc (NIH): 72 mg/dL (ref 0–99)
Triglycerides: 95 mg/dL (ref 0–149)
VLDL Cholesterol Cal: 18 mg/dL (ref 5–40)

## 2020-10-20 LAB — TSH: TSH: 2.03 u[IU]/mL (ref 0.450–4.500)

## 2020-10-22 ENCOUNTER — Telehealth: Payer: Self-pay

## 2020-10-22 NOTE — Telephone Encounter (Signed)
-----   Message from Jenean Lindau, MD sent at 10/21/2020  7:15 PM EDT ----- The results of the study is unremarkable. Please inform patient. I will discuss in detail at next appointment. Cc  primary care/referring physician Jenean Lindau, MD 10/21/2020 7:15 PM

## 2020-10-22 NOTE — Telephone Encounter (Signed)
Spoke with patient regarding results and recommendation.  Patient verbalizes understanding and is agreeable to plan of care. Advised patient to call back with any issues or concerns.  

## 2021-03-12 ENCOUNTER — Ambulatory Visit: Payer: Medicare Other | Admitting: Cardiology

## 2021-03-28 ENCOUNTER — Ambulatory Visit: Payer: Medicare Other | Admitting: Cardiology

## 2021-03-28 ENCOUNTER — Encounter: Payer: Self-pay | Admitting: Cardiology

## 2021-03-28 ENCOUNTER — Other Ambulatory Visit: Payer: Self-pay

## 2021-03-28 VITALS — BP 128/74 | HR 72 | Ht 72.0 in | Wt 328.0 lb

## 2021-03-28 DIAGNOSIS — Z6841 Body Mass Index (BMI) 40.0 and over, adult: Secondary | ICD-10-CM

## 2021-03-28 DIAGNOSIS — Z131 Encounter for screening for diabetes mellitus: Secondary | ICD-10-CM | POA: Diagnosis not present

## 2021-03-28 DIAGNOSIS — I10 Essential (primary) hypertension: Secondary | ICD-10-CM | POA: Diagnosis not present

## 2021-03-28 DIAGNOSIS — I77810 Thoracic aortic ectasia: Secondary | ICD-10-CM | POA: Diagnosis not present

## 2021-03-28 DIAGNOSIS — I2510 Atherosclerotic heart disease of native coronary artery without angina pectoris: Secondary | ICD-10-CM | POA: Diagnosis not present

## 2021-03-28 DIAGNOSIS — E559 Vitamin D deficiency, unspecified: Secondary | ICD-10-CM | POA: Diagnosis not present

## 2021-03-28 DIAGNOSIS — E782 Mixed hyperlipidemia: Secondary | ICD-10-CM | POA: Diagnosis not present

## 2021-03-28 NOTE — Patient Instructions (Addendum)
Medication Instructions:  ?Your physician recommends that you continue on your current medications as directed. Please refer to the Current Medication list given to you today. ? ?*If you need a refill on your cardiac medications before your next appointment, please call your pharmacy* ? ? ?Lab Work: ?Your physician recommends that you have labs done in the office today. Your test included  basic metabolic panel, complete blood count, TSH, vitamin D, A1C, liver function and lipids. ? ?If you have labs (blood work) drawn today and your tests are completely normal, you will receive your results only by: ?MyChart Message (if you have MyChart) OR ?A paper copy in the mail ?If you have any lab test that is abnormal or we need to change your treatment, we will call you to review the results. ? ? ?Testing/Procedures: ?None ordered ? ? ?Follow-Up: ?At [facility], you and your health needs are our priority.  As part of our continuing mission to provide you with exceptional heart care, we have created designated Provider Care Teams.  These Care Teams include your primary Cardiologist (physician) and Advanced Practice Providers (APPs -  Physician Assistants and Nurse Practitioners) who all work together to provide you with the care you need, when you need it. ? ?We recommend signing up for the patient portal called "MyChart".  Sign up information is provided on this After Visit Summary.  MyChart is used to connect with patients for Virtual Visits (Telemedicine).  Patients are able to view lab/test results, encounter notes, upcoming appointments, etc.  Non-urgent messages can be sent to your provider as well.   ?To learn more about what you can do with MyChart, go to [email or web address].   ? ?Your next appointment:   ?6 month(s) ? ?The format for your next appointment:   ?In Person ? ?Provider:   ?Jyl Heinz, MD ? ? ?Other Instructions ?NA   ?

## 2021-03-28 NOTE — Progress Notes (Signed)
?Cardiology Office Note:   ? ?Date:  03/28/2021  ? ?ID:  Stephen Medina, DOB April 09, 1950, MRN [id number] ? ?PCP:  Sharilyn Sites, MD  ?Cardiologist:  Jenean Lindau, MD  ? ?Referring MD: Sharilyn Sites, MD  ? ? ?ASSESSMENT:   ? ?1. Coronary artery disease involving native coronary artery of native heart without angina pectoris   ?2. Ascending aorta dilatation (HCC)   ?3. Mixed hyperlipidemia   ?4. Primary hypertension   ?5. Body mass index (BMI) 40.0-44.9, adult (HCC)   ? ?PLAN:   ? ?In order of problems listed above: ? ?Coronary artery disease: Secondary prevention stressed with the patient.  Importance of compliance with diet medication stressed any vocalized understanding.  He was advised to ambulate to the best of his ability.  He has orthopedic issues. ?Ascending aorta dilatation: I discussed this with him at length and we will do a follow-up CT scan to assess this.  He is agreeable. ?Mixed dyslipidemia: Patient is fasting and will get complete blood work including lipids ?Essential hypertension: Blood pressure stable and diet was emphasized. ?Morbid obesity: I discussed this with the patient at length.  He has significant increase in weight since last evaluation.  He promises to do better. ?Patient will be seen in follow-up appointment in 6 months or earlier if the patient has any concerns ? ? ? ?Medication Adjustments/Labs and Tests Ordered: ?Current medicines are reviewed at length with the patient today.  Concerns regarding medicines are outlined above.  ?No orders of the defined types were placed in this encounter. ? ?No orders of the defined types were placed in this encounter. ? ? ? ?No chief complaint on file. ?  ? ?History of Present Illness:   ? ?Stephen Medina is a 71 y.o. male.  Patient has past medical history of coronary artery disease, essential hypertension, mixed dyslipidemia and morbid obesity.  He denies any problems at this time and takes care of activities of daily living.  No chest pain  orthopnea or PND.  At the time of my evaluation, the patient is alert awake oriented and in no distress. ? ?Past Medical History:  ?Diagnosis Date  ? Ankle arthritis 10/20/2018  ? Ascending aorta dilatation ([place]) 06/01/2019  ? Body mass index (BMI) 40.0-44.9, adult ([place]) 11/29/2019  ? CAD (coronary artery disease) 08/01/2013  ? Cath 2005  normal Left main, scattered irregularities LAD, 80% stenosis ostial Diag 1, scattered irregularities CFX, scattered irregularities RCA  Taxus stent March 2005 Diagonal   ? Disc displacement, lumbar   ? Disorder of ankle joint 10/20/2018  ? Displacement of lumbar intervertebral disc without myelopathy 08/23/2015  ? Essential (primary) hypertension 02/02/2014  ? Foot ulceration ([employer])   ? left  ? GERD (gastroesophageal reflux disease)   ? History of heart artery stent 05/14/2002  ? History of ulcer disease 10/19/2013  ? HNP (herniated nucleus pulposus), lumbar 09/03/2015  ? Hyperlipidemia   ? Hypertension   ? Inflammation of sacroiliac joint ([place]) 12/24/2017  ? Lumbar spondylosis 04/01/2013  ? Lumbosacral spondylosis without myelopathy 01/18/2013  ? Mixed dyslipidemia 11/27/2017  ? Morbid obesity ([place])   ? Neuromuscular disorder ([place])   ? neuropathy left foot   ? Nonhealing ulcer of left lower extremity ([place]) 04/16/2015  ? OA (osteoarthritis) of hip 06/08/2019  ? Other spondylosis with radiculopathy, lumbar region 11/29/2019  ? Pain in right knee 05/28/2017  ? Preop cardiovascular exam 06/01/2019  ? Primary localized osteoarthrosis of ankle and foot 06/30/2018  ? Primary osteoarthritis  of right hip 06/08/2019  ? Sacroiliitis ([place]) 01/27/2020  ? Spinal stenosis of lumbar region 03/17/2013  ? 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 Ulcer of left foot ([place]) 12/04/2014  ? Ulcer of toe of left foot ([place]) 04/16/2015  ? Wears glasses   ? ? ?Past Surgical History:  ?Procedure Laterality Date  ? APPENDECTOMY    ? BACK SURGERY    ? BONE EXOSTOSIS EXCISION Left  01/28/2018  ? Procedure: Exostectomy left first metatarsal base and medial cuneiform;  Surgeon: Wylene Simmer, MD;  Location: [place];  Service: Orthopedics;  Laterality: Left;  68mn  ? CARDIAC CATHETERIZATION    ? 05    1 stent   dr tWynonia Lawman ? COLONOSCOPY    ? ESOPHAGOGASTRODUODENOSCOPY N/A 08/02/2013  ? Procedure: ESOPHAGOGASTRODUODENOSCOPY (EGD);  Surgeon: MLadene Artist MD;  [facility];  Service: Endoscopy;  Laterality: N/A;  ? FOOT ARTHRODESIS Left 05/22/2016  ? Procedure: Left Subtalar and Talonavicular Joint Arthrodesis;  Surgeon: HWylene Simmer MD;  [place]  Service: Orthopedics;  Laterality: Left;  ? FOOT SURGERY    ? GASTROCNEMIUS RECESSION Left 05/22/2016  ? Procedure: Left Gastroc Recession;  Surgeon: HWylene Simmer MD;  [place]  Service: Orthopedics;  Laterality: Left;  ? GRAFT APPLICATION N/A 65/03/9765 ? Procedure: INTEGRA GRAFT APPLICATION;  Surgeon: MTrula Slade DPM;  [place] [facility]  Service: Podiatry;  Laterality: N/A;  ? LEFT HEART CATHETERIZATION WITH CORONARY ANGIOGRAM N/A 02/09/2014  ? Procedure: LEFT HEART CATHETERIZATION WITH CORONARY ANGIOGRAM;  Surgeon: Peter M JMartinique MD;  [facility];  Service: Cardiovascular;  Laterality: N/A;  ? LUMBAR LAMINECTOMY    ? x2  ? LUMBAR LAMINECTOMY/DECOMPRESSION MICRODISCECTOMY Left 09/03/2015  ? Procedure: MICRODISCECTOMY L5-S1 LEFT;  Surgeon: KAshok Pall MD;  [facility];  Service: Neurosurgery;  Laterality: Left;   MICRODISCECTOMY L5-S1 LEFT  ? LUMBAR LAMINECTOMY/DECOMPRESSION MICRODISCECTOMY Left 02/13/2016  ? Procedure: MICRODISCECTOMY LUMBAR FIVE- SACRAL ONE LEFT;  Surgeon: KAshok Pall MD;  [place] [facility]  Service: Neurosurgery;  Laterality: Left;  ? TONSILLECTOMY    ? TOTAL HIP ARTHROPLASTY Right 06/08/2019  ? Procedure: TOTAL HIP ARTHROPLASTY ANTERIOR APPROACH;  Surgeon: AGaynelle Arabian MD;  [facility];  Service:  Orthopedics;  Laterality: Right;  1092m  ? WOUND DEBRIDEMENT N/A 06/22/2015  ? Procedure: DEBRIDEMENT WOUND;  Surgeon: MaTrula SladeDPM;  [place] [facility] Service: Podiatry;  Laterality: N/A;  ? ? ?Current Medications: ?Current Meds  ?Medication Sig  ? acetaminophen (TYLENOL) 325 MG tablet Take 650 mg by mouth every 4 (four) hours as needed for pain.  ? aspirin EC 81 MG tablet Take 1 tablet (81 mg total) by mouth daily. Swallow whole.  ? Cholecalciferol (VITAMIN D-3) 125 MCG (5000 UT) TABS Take 500 mg by mouth daily.  ? clopidogrel (PLAVIX) 75 MG tablet TAKE 1 TABLET BY MOUTH EVERY DAY  ? ezetimibe (ZETIA) 10 MG tablet TAKE 1 TABLET BY MOUTH DAILY  ? meloxicam (MOBIC) 15 MG tablet Take 15 mg by mouth daily.   ? metoprolol succinate (TOPROL-XL) 25 MG 24 hr tablet TAKE 1 TABLET BY MOUTH EVERY DAY  ? nitroGLYCERIN (NITROSTAT) 0.4 MG SL tablet Place 0.4 mg under the tongue every 5 (five) minutes as needed for chest pain.  ? pantoprazole (PROTONIX) 40 MG tablet Take 1 tablet (40 mg total) by mouth 2 (two) times daily.  ? rosuvastatin (  CRESTOR) 40 MG tablet TAKE 1/2 TABLET BY MOUTH EVERY DAY  ? zinc gluconate 50 MG tablet Take 50 mg by mouth daily.  ?  ? ?Allergies:   Silicone, Adhesive [tape], Latex, and Other  ? ?Social History  ? ?Socioeconomic History  ? Marital status: Married  ?  Spouse name: Not on file  ? Number of children: Not on file  ? Years of education: Not on file  ? Highest education level: Not on file  ?Occupational History  ? Not on file  ?Tobacco Use  ? Smoking status: Never  ? Smokeless tobacco: Former  ?  Types: Chew  ?Vaping Use  ? Vaping Use: Never used  ?Substance and Sexual Activity  ? Alcohol use: No  ?  Alcohol/week: 1.0 standard drink  ?  Types: 1 Glasses of wine per week  ? Drug use: No  ? Sexual activity: Not Currently  ?Other Topics Concern  ? Not on file  ?Social History Narrative  ? Pt lives in [place] with his wife. They have one son who does not live in the area.  Pt previously worked for [employer] but is retired. He used to [employer]. He has never smoke and drinks 4-5 alcoholic beverages per week. No illicit drug use.  ? ?Social Determinants of Heal

## 2021-05-20 DIAGNOSIS — D239 Other benign neoplasm of skin, unspecified: Secondary | ICD-10-CM | POA: Diagnosis not present

## 2021-05-20 DIAGNOSIS — L570 Actinic keratosis: Secondary | ICD-10-CM | POA: Diagnosis not present

## 2021-07-22 DIAGNOSIS — N390 Urinary tract infection, site not specified: Secondary | ICD-10-CM | POA: Diagnosis not present

## 2021-08-09 DIAGNOSIS — N390 Urinary tract infection, site not specified: Secondary | ICD-10-CM | POA: Diagnosis not present

## 2021-08-13 ENCOUNTER — Other Ambulatory Visit (HOSPITAL_COMMUNITY): Payer: Self-pay | Admitting: Family Medicine

## 2021-08-13 ENCOUNTER — Other Ambulatory Visit: Payer: Self-pay | Admitting: Family Medicine

## 2021-08-13 ENCOUNTER — Ambulatory Visit (HOSPITAL_COMMUNITY)
Admission: RE | Admit: 2021-08-13 | Discharge: 2021-08-13 | Disposition: A | Discharge status: Home / Self Care | Payer: Medicare Other | Source: Ambulatory Visit | Attending: Family Medicine | Admitting: Family Medicine

## 2021-08-13 DIAGNOSIS — N5089 Other specified disorders of the male genital organs: Secondary | ICD-10-CM | POA: Diagnosis not present

## 2021-09-02 ENCOUNTER — Ambulatory Visit: Payer: Medicare Other | Admitting: Urology

## 2021-09-02 VITALS — BP 144/82 | HR 66

## 2021-09-02 DIAGNOSIS — N5089 Other specified disorders of the male genital organs: Secondary | ICD-10-CM | POA: Diagnosis not present

## 2021-09-02 DIAGNOSIS — R350 Frequency of micturition: Secondary | ICD-10-CM | POA: Diagnosis not present

## 2021-09-02 DIAGNOSIS — Z125 Encounter for screening for malignant neoplasm of prostate: Secondary | ICD-10-CM

## 2021-09-02 LAB — URINALYSIS, ROUTINE W REFLEX MICROSCOPIC
Bilirubin, UA: NEGATIVE
Glucose, UA: NEGATIVE
Ketones, UA: NEGATIVE
Leukocytes,UA: NEGATIVE
Nitrite, UA: NEGATIVE
Protein,UA: NEGATIVE
RBC, UA: NEGATIVE
Specific Gravity, UA: 1.03 (ref 1.005–1.030)
Urobilinogen, Ur: 0.2 mg/dL (ref 0.2–1.0)
pH, UA: 5 (ref 5.0–7.5)

## 2021-09-02 NOTE — Progress Notes (Unsigned)
09/02/2021 1:24 PM   Stephen Medina 03/11/1950 [id number]  Referring provider: Sharilyn Sites, MD [address] [place],  [place] [address]  No chief complaint on file.   HPI:  New patient-  1) UTI - he developed frequency and urgency and mild dysuria. He took Cipro and then conitnued to have some LUTS. He then took another abx. He then noticed bilateral testicle swelling. Scrotal ultrasound 08/13/2021 with benign with enlarged epididymal heads. No GU hx or vasectomy.   He is LUTS improved. His testicles are tender. Has one more cipro today. No gross hematuria. UA clear.   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    PMH: Past Medical History:  Diagnosis Date   Ankle arthritis 10/20/2018   Ascending aorta dilatation (HCC) 06/01/2019   Body mass index (BMI) 40.0-44.9, adult ([place]) 11/29/2019   CAD (coronary artery disease) 08/01/2013   Cath 2005  normal Left main, scattered irregularities LAD, 80% stenosis ostial Diag 1, scattered irregularities CFX, scattered irregularities RCA  Taxus stent March 2005 Diagonal    Disc displacement, lumbar    Disorder of ankle joint 10/20/2018   Displacement of lumbar intervertebral disc without myelopathy 08/23/2015   Essential (primary) hypertension 02/02/2014   Foot ulceration ([place])    left   GERD (gastroesophageal reflux disease)    History of heart artery stent 05/14/2002   History of ulcer disease 10/19/2013   HNP (herniated nucleus pulposus), lumbar 09/03/2015   Hyperlipidemia    Hypertension    Inflammation of sacroiliac joint ([place]) 12/24/2017   Lumbar spondylosis 04/01/2013   Lumbosacral spondylosis without myelopathy 01/18/2013   Mixed dyslipidemia 11/27/2017   Morbid obesity ([place])    Neuromuscular disorder ([place])    neuropathy left foot    Nonhealing ulcer of left lower extremity ([place]) 04/16/2015   OA (osteoarthritis) of hip 06/08/2019   Other spondylosis with radiculopathy, lumbar region 11/29/2019   Pain in right knee  05/28/2017   Preop cardiovascular exam 06/01/2019   Primary localized osteoarthrosis of ankle and foot 06/30/2018   Primary osteoarthritis of right hip 06/08/2019   Sacroiliitis ([place]) 01/27/2020   Spinal stenosis of lumbar region 03/17/2013   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   Ulcer of left foot ([place]) 12/04/2014   Ulcer of toe of left foot ([place]) 04/16/2015   Wears glasses     Surgical History: Past Surgical History:  Procedure Laterality Date   APPENDECTOMY     BACK SURGERY     BONE EXOSTOSIS EXCISION Left 01/28/2018   Procedure: Exostectomy left first metatarsal base and medial cuneiform;  Surgeon: Wylene Simmer, MD;  Location: [facility];  Service: Orthopedics;  Laterality: Left;  6mn   CARDIAC CATHETERIZATION     05    1 stent   dr tWynonia Lawman  COLONOSCOPY     ESOPHAGOGASTRODUODENOSCOPY N/A 08/02/2013   Procedure: ESOPHAGOGASTRODUODENOSCOPY (EGD);  Surgeon: MLadene Artist MD;  [facility];  Service: Endoscopy;  Laterality: N/A;   FOOT ARTHRODESIS Left 05/22/2016   Procedure: Left Subtalar and Talonavicular Joint Arthrodesis;  Surgeon: HWylene Simmer MD;  [facility]  Service: Orthopedics;  Laterality: Left;   FOOT SURGERY     GASTROCNEMIUS RECESSION Left 05/22/2016   Procedure: Left Gastroc Recession;  Surgeon: HWylene Simmer MD;  [facility]  Service: Orthopedics;  Laterality: Left;   GRAFT APPLICATION N/A 61/01/9415  Procedure: INTEGRA GRAFT APPLICATION;  Surgeon: MTrula Slade DPM;  Location: [facility];  Service: Podiatry;  Laterality: N/A;   LEFT HEART CATHETERIZATION WITH CORONARY ANGIOGRAM N/A 02/09/2014   Procedure: LEFT HEART CATHETERIZATION WITH CORONARY ANGIOGRAM;  Surgeon: Peter M Martinique, MD;  Location: [facility];  Service: Cardiovascular;  Laterality: N/A;   LUMBAR LAMINECTOMY     x2   LUMBAR LAMINECTOMY/DECOMPRESSION MICRODISCECTOMY Left  09/03/2015   Procedure: MICRODISCECTOMY L5-S1 LEFT;  Surgeon: Ashok Pall, MD;  Location: [facility];  Service: Neurosurgery;  Laterality: Left;   MICRODISCECTOMY L5-S1 LEFT   LUMBAR LAMINECTOMY/DECOMPRESSION MICRODISCECTOMY Left 02/13/2016   Procedure: MICRODISCECTOMY LUMBAR FIVE- SACRAL ONE LEFT;  Surgeon: Ashok Pall, MD;  Location: [facility];  Service: Neurosurgery;  Laterality: Left;   TONSILLECTOMY     TOTAL HIP ARTHROPLASTY Right 06/08/2019   Procedure: TOTAL HIP ARTHROPLASTY ANTERIOR APPROACH;  Surgeon: Gaynelle Arabian, MD;  [facility];  Service: Orthopedics;  Laterality: Right;  172mn   WOUND DEBRIDEMENT N/A 06/22/2015   Procedure: DEBRIDEMENT WOUND;  Surgeon: MTrula Slade DPM;  [facility]  Service: Podiatry;  Laterality: N/A;    Home Medications:  Allergies as of 09/02/2021       Reactions   Silicone    Other reaction(s): Other (See Comments) Blisters after fabric tape.  Paper tape ok. Tegaderm OK.   Adhesive [tape] Other (See Comments)   Blisters.  Paper tape ok.   Latex    Other reaction(s): Hives/Skin Rash   Other Other (See Comments)   Blisters.  Paper tape ok.        Medication List        Accurate as of September 02, 2021  1:24 PM. If you have any questions, ask your nurse or doctor.          acetaminophen 325 MG tablet Commonly known as: TYLENOL Take 650 mg by mouth every 4 (four) hours as needed for pain.   aspirin EC 81 MG tablet Take 1 tablet (81 mg total) by mouth daily. Swallow whole.   ciprofloxacin 500 MG tablet Commonly known as: CIPRO Take 500 mg by mouth 2 (two) times daily.   clopidogrel 75 MG tablet Commonly known as: PLAVIX Take 1 tablet by mouth daily.   clopidogrel 75 MG tablet Commonly known as: PLAVIX TAKE 1 TABLET BY MOUTH EVERY DAY   ezetimibe 10 MG tablet Commonly known as: ZETIA TAKE 1 TABLET BY MOUTH DAILY   meloxicam 15 MG tablet Commonly known as: MOBIC Take 15 mg by mouth daily.    metoprolol succinate 25 MG 24 hr tablet Commonly known as: TOPROL-XL TAKE 1 TABLET BY MOUTH EVERY DAY   nitroGLYCERIN 0.4 MG SL tablet Commonly known as: NITROSTAT Place 0.4 mg under the tongue every 5 (five) minutes as needed for chest pain.   pantoprazole 40 MG tablet Commonly known as: PROTONIX Take 1 tablet (40 mg total) by mouth 2 (two) times daily.   rosuvastatin 40 MG tablet Commonly known as: CRESTOR TAKE 1/2 TABLET BY MOUTH EVERY DAY   Vitamin D-3 125 MCG (5000 UT) Tabs Take 500 mg by mouth daily.   zinc gluconate 50 MG tablet Take 50 mg by mouth daily.        Allergies:  Allergies  Allergen Reactions   Silicone     Other reaction(s): Other (See Comments) Blisters after fabric tape.  Paper tape ok. Tegaderm OK.   Adhesive [Tape] Other (See Comments)    Blisters.  Paper tape ok.   Latex  Other reaction(s): Hives/Skin Rash   Other Other (See Comments)    Blisters.  Paper tape ok.    Family History: Family History  Problem Relation Age of Onset   Coronary artery disease Father        open heart surgery    Cancer Brother    CAD Cousin        requiring stent   Colon cancer Neg Hx    Pancreatic cancer Neg Hx    Rectal cancer Neg Hx    Stomach cancer Neg Hx     Social History:  reports that he has never smoked. He has quit using smokeless tobacco.  His smokeless tobacco use included chew. He reports that he does not drink alcohol and does not use drugs.   Physical Exam: BP (!) 144/82   Pulse 66   Constitutional:  Alert and oriented, No acute distress. HEENT: [place] AT, moist mucus membranes.  Trachea midline, no masses. Cardiovascular: No clubbing, cyanosis, or edema. Respiratory: Normal respiratory effort, no increased work of breathing. GI: Abdomen is soft, nontender, nondistended, no abdominal masses GU: No CVA tenderness Lymph: No cervical or inguinal lymphadenopathy. Skin: No rashes, bruises or suspicious lesions. Neurologic: Grossly intact,  no focal deficits, moving all 4 extremities. Psychiatric: Normal mood and affect. GU: Penis circumcised, normal foreskin, testicles descended bilaterally and palpably normal, bilateral epididymis palpably normal, scrotum normal DRE: Prostate 30 g, smooth without hard area or nodule   Laboratory Data: Lab Results  Component Value Date   WBC 6.6 10/19/2020   HGB 14.7 10/19/2020   HCT 43.4 10/19/2020   MCV 89 10/19/2020   PLT 254 10/19/2020    Lab Results  Component Value Date   CREATININE 0.83 10/19/2020    No results found for: "PSA"  No results found for: "TESTOSTERONE"  Lab Results  Component Value Date   HGBA1C 6.2 (H) 01/12/2015    Urinalysis    Component Value Date/Time   COLORURINE YELLOW 12/24/2018 1543   APPEARANCEUR CLOUDY (A) 12/24/2018 1543   LABSPEC 1.023 12/24/2018 1543   PHURINE 5.0 12/24/2018 1543   GLUCOSEU NEGATIVE 12/24/2018 1543   HGBUR LARGE (A) 12/24/2018 1543   BILIRUBINUR NEGATIVE 12/24/2018 1543   KETONESUR 5 (A) 12/24/2018 1543   PROTEINUR 100 (A) 12/24/2018 1543   UROBILINOGEN 0.2 08/01/2013 1451   NITRITE NEGATIVE 12/24/2018 1543   LEUKOCYTESUR LARGE (A) 12/24/2018 1543    Lab Results  Component Value Date   BACTERIA NONE SEEN 12/24/2018    Pertinent Imaging: Scrotal ultrasound   Assessment & Plan:    1. Testicular swelling Likely related to UTI. Improving. Benign [place] and exam.  - Urinalysis, Routine w reflex microscopic  2. PCa  screening - PSA was sent  No follow-ups on file.  Festus Aloe, MD  [facility]  [address] [place], [place] [address] [phone]

## 2021-09-03 LAB — PSA: Prostate Specific Ag, Serum: 0.5 ng/mL (ref 0.0–4.0)

## 2021-09-20 ENCOUNTER — Encounter (INDEPENDENT_AMBULATORY_CARE_PROVIDER_SITE_OTHER): Payer: Medicare Other | Admitting: Ophthalmology

## 2021-09-20 DIAGNOSIS — I10 Essential (primary) hypertension: Secondary | ICD-10-CM

## 2021-09-20 DIAGNOSIS — D3132 Benign neoplasm of left choroid: Secondary | ICD-10-CM | POA: Diagnosis not present

## 2021-09-20 DIAGNOSIS — H43813 Vitreous degeneration, bilateral: Secondary | ICD-10-CM

## 2021-09-20 DIAGNOSIS — H35033 Hypertensive retinopathy, bilateral: Secondary | ICD-10-CM

## 2021-09-20 DIAGNOSIS — D3131 Benign neoplasm of right choroid: Secondary | ICD-10-CM

## 2021-09-26 DIAGNOSIS — M1711 Unilateral primary osteoarthritis, right knee: Secondary | ICD-10-CM | POA: Diagnosis not present

## 2021-09-26 DIAGNOSIS — M25561 Pain in right knee: Secondary | ICD-10-CM | POA: Diagnosis not present

## 2021-10-07 ENCOUNTER — Ambulatory Visit: Payer: Medicare Other | Admitting: Cardiology

## 2021-10-09 DIAGNOSIS — J029 Acute pharyngitis, unspecified: Secondary | ICD-10-CM | POA: Diagnosis not present

## 2021-10-09 DIAGNOSIS — R490 Dysphonia: Secondary | ICD-10-CM | POA: Diagnosis not present

## 2021-11-08 ENCOUNTER — Other Ambulatory Visit: Payer: Self-pay

## 2021-11-21 ENCOUNTER — Ambulatory Visit: Payer: Medicare Other | Attending: Cardiology | Admitting: Cardiology

## 2021-11-21 ENCOUNTER — Encounter: Payer: Self-pay | Admitting: Cardiology

## 2021-11-21 VITALS — BP 138/72 | HR 75 | Ht 72.0 in | Wt 328.0 lb

## 2021-11-21 DIAGNOSIS — I77810 Thoracic aortic ectasia: Secondary | ICD-10-CM

## 2021-11-21 DIAGNOSIS — I2510 Atherosclerotic heart disease of native coronary artery without angina pectoris: Secondary | ICD-10-CM | POA: Diagnosis not present

## 2021-11-21 DIAGNOSIS — E782 Mixed hyperlipidemia: Secondary | ICD-10-CM | POA: Diagnosis not present

## 2021-11-21 DIAGNOSIS — I209 Angina pectoris, unspecified: Secondary | ICD-10-CM

## 2021-11-21 HISTORY — DX: Angina pectoris, unspecified: I20.9

## 2021-11-21 MED ORDER — NITROGLYCERIN 0.4 MG SL SUBL: 0.4000 mg | SUBLINGUAL_TABLET | SUBLINGUAL | 10 refills | Status: DC | PRN

## 2021-11-21 NOTE — Progress Notes (Signed)
Cardiology Office Note:    Date:  11/21/2021   ID:  Stephen Medina, DOB Jun 05, 1950, MRN [id number]  PCP:  Sharilyn Sites, MD  Cardiologist:  Jenean Lindau, MD   Referring MD: Sharilyn Sites, MD    ASSESSMENT:    1. Coronary artery disease involving native coronary artery of native heart without angina pectoris   2. Ascending aorta dilatation (HCC)   3. Morbid obesity ([place])   4. Mixed dyslipidemia   5. Angina pectoris ([place])    PLAN:    In order of problems listed above:  Angina pectoris and coronary artery disease: Secondary prevention stressed with patient.  Importance of compliance with diet medication stressed any vocalized understanding.  His symptoms are concerning.  Following recommendations were made to him.  Sublingual nitroglycerin prescription was sent, its protocol and 911 protocol explained and the patient vocalized understanding questions were answered to the patient's satisfaction.In view of the patient's symptoms, I discussed with the patient options for evaluation. Invasive and noninvasive options were given to the patient. I discussed stress testing and coronary angiography and left heart catheterization at length. Benefits, pros and cons of each approach were discussed at length. Patient had multiple questions which were answered to the patient's satisfaction. Patient opted for invasive evaluation and we will set up for coronary angiography and left heart catheterization. Further recommendations will be made based on the findings with coronary angiography. In the interim if the patient has any significant symptoms in hospital to the nearest emergency room. Essential hypertension: Blood pressure stable and diet was emphasized.  Lifestyle modification urged. Mixed dyslipidemia: Lipids were reviewed he is fasting and will have complete blood work today.  He gives history of vitamin D deficiency and we will check this for him.  We will also do hemoglobin A1c. Morbid obesity:  Weight reduction stressed diet was emphasized.  Risks of obesity explained and he promises to do better.  He will be seen in follow-up appointment after coronary angiography.   Medication Adjustments/Labs and Tests Ordered: Current medicines are reviewed at length with the patient today.  Concerns regarding medicines are outlined above.  No orders of the defined types were placed in this encounter.  No orders of the defined types were placed in this encounter.    No chief complaint on file.    History of Present Illness:    Stephen Medina is a 71 y.o. male.  Patient has past medical history of coronary artery disease post stenting about 20 years ago at [facility], ascending aortic dilatation/aneurysm, essential hypertension, dyslipidemia.  He leads a sedentary lifestyle because of orthopedic issues.  Patient mentions to me that on exertion now he has some chest tightness and sweating sensation which is happening in the past couple of weeks.  No orthopnea or PND.  At the time of my evaluation, the patient is alert awake oriented and in no distress.  Past Medical History:  Diagnosis Date   Ankle arthritis 10/20/2018   Ascending aorta dilatation ([place]) 06/01/2019   Body mass index (BMI) 40.0-44.9, adult ([place]) 11/29/2019   CAD (coronary artery disease) 08/01/2013   Cath 2005  normal Left main, scattered irregularities LAD, 80% stenosis ostial Diag 1, scattered irregularities CFX, scattered irregularities RCA  Taxus stent March 2005 Diagonal    Disc displacement, lumbar    Disorder of ankle joint 10/20/2018   Displacement of lumbar intervertebral disc without myelopathy 08/23/2015   Essential (primary) hypertension 02/02/2014   Foot ulceration ([place])  left   GERD (gastroesophageal reflux disease)    History of heart artery stent 05/14/2002   History of right hip replacement 04/14/2019   History of ulcer disease 10/19/2013   HNP (herniated nucleus pulposus), lumbar 09/03/2015    Hyperlipidemia    Hypertension    Inflammation of sacroiliac joint ([place]) 12/24/2017   Lumbar spondylosis 04/01/2013   Lumbosacral spondylosis without myelopathy 01/18/2013   Mixed dyslipidemia 11/27/2017   Morbid obesity ([place])    Neuromuscular disorder ([place])    neuropathy left foot    Nonhealing ulcer of left lower extremity ([place]) 04/16/2015   OA (osteoarthritis) of hip 06/08/2019   Other spondylosis with radiculopathy, lumbar region 11/29/2019   Pain in right knee 05/28/2017   Preop cardiovascular exam 06/01/2019   Primary localized osteoarthrosis of ankle and foot 06/30/2018   Primary osteoarthritis of right hip 06/08/2019   Sacroiliitis ([place]) 01/27/2020   Spinal stenosis of lumbar region 03/17/2013   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   Ulcer of left foot ([place]) 12/04/2014   Ulcer of toe of left foot ([place]) 04/16/2015   Wears glasses     Past Surgical History:  Procedure Laterality Date   APPENDECTOMY     BACK SURGERY     BONE EXOSTOSIS EXCISION Left 01/28/2018   Procedure: Exostectomy left first metatarsal base and medial cuneiform;  Surgeon: Wylene Simmer, MD;  Location: [facility];  Service: Orthopedics;  Laterality: Left;  52mn   CARDIAC CATHETERIZATION     05    1 stent   dr tWynonia Lawman  COLONOSCOPY     ESOPHAGOGASTRODUODENOSCOPY N/A 08/02/2013   Procedure: ESOPHAGOGASTRODUODENOSCOPY (EGD);  Surgeon: MLadene Artist MD;  [facility];  Service: Endoscopy;  Laterality: N/A;   FOOT ARTHRODESIS Left 05/22/2016   Procedure: Left Subtalar and Talonavicular Joint Arthrodesis;  Surgeon: HWylene Simmer MD;  [facility]  Service: Orthopedics;  Laterality: Left;   FOOT SURGERY     GASTROCNEMIUS RECESSION Left 05/22/2016   Procedure: Left Gastroc Recession;  Surgeon: HWylene Simmer MD;  [facility]  Service: Orthopedics;  Laterality: Left;   GRAFT APPLICATION N/A 61/07/15  Procedure:  INTEGRA GRAFT APPLICATION;  Surgeon: MTrula Slade DPM;  [facility]  Service: Podiatry;  Laterality: N/A;   LEFT HEART CATHETERIZATION WITH CORONARY ANGIOGRAM N/A 02/09/2014   Procedure: LEFT HEART CATHETERIZATION WITH CORONARY ANGIOGRAM;  Surgeon: Peter M JMartinique MD;  [facility];  Service: Cardiovascular;  Laterality: N/A;   LUMBAR LAMINECTOMY     x2   LUMBAR LAMINECTOMY/DECOMPRESSION MICRODISCECTOMY Left 09/03/2015   Procedure: MICRODISCECTOMY L5-S1 LEFT;  Surgeon: KAshok Pall MD;  [facility];  Service: Neurosurgery;  Laterality: Left;   MICRODISCECTOMY L5-S1 LEFT   LUMBAR LAMINECTOMY/DECOMPRESSION MICRODISCECTOMY Left 02/13/2016   Procedure: MICRODISCECTOMY LUMBAR FIVE- SACRAL ONE LEFT;  Surgeon: KAshok Pall MD;  [facility]  Service: Neurosurgery;  Laterality: Left;   TONSILLECTOMY     TOTAL HIP ARTHROPLASTY Right 06/08/2019   Procedure: TOTAL HIP ARTHROPLASTY ANTERIOR APPROACH;  Surgeon: AGaynelle Arabian MD;  [facility];  Service: Orthopedics;  Laterality: Right;  1036m   WOUND DEBRIDEMENT N/A 06/22/2015   Procedure: DEBRIDEMENT WOUND;  Surgeon: MaTrula SladeDPM;  [facility] Service: Podiat[facility];  Laterality: N/A;    Current Medications: Current Meds  Medication Sig   acetaminophen (TYLENOL) 325 MG tablet Take 650 mg by mouth every 4 (  four) hours as needed for pain.   aspirin EC 81 MG tablet Take 1 tablet (81 mg total) by mouth daily. Swallow whole.   Cholecalciferol (VITAMIN D-3) 125 MCG (5000 UT) TABS Take 500 mg by mouth daily.   clopidogrel (PLAVIX) 75 MG tablet TAKE 1 TABLET BY MOUTH EVERY DAY   ezetimibe (ZETIA) 10 MG tablet TAKE 1 TABLET BY MOUTH DAILY   fluticasone (FLONASE) 50 MCG/ACT nasal spray Place 2 sprays into both nostrils as needed for allergies or rhinitis.   levocetirizine (XYZAL) 5 MG tablet Take 5 mg by mouth daily.   meloxicam (MOBIC) 15 MG tablet Take 15 mg by mouth  daily.    metoprolol succinate (TOPROL-XL) 25 MG 24 hr tablet TAKE 1 TABLET BY MOUTH EVERY DAY   nitroGLYCERIN (NITROSTAT) 0.4 MG SL tablet Place 0.4 mg under the tongue every 5 (five) minutes as needed for chest pain.   pantoprazole (PROTONIX) 40 MG tablet Take 1 tablet (40 mg total) by mouth 2 (two) times daily.   rosuvastatin (CRESTOR) 40 MG tablet TAKE 1/2 TABLET BY MOUTH EVERY DAY   zinc gluconate 50 MG tablet Take 50 mg by mouth daily.     Allergies:   Silicone, Adhesive [tape], Latex, and Other   Social History   Socioeconomic History   Marital status: Married    Spouse name: Not on file   Number of children: Not on file   Years of education: Not on file   Highest education level: Not on file  Occupational History   Not on file  Tobacco Use   Smoking status: Never   Smokeless tobacco: Former    Types: Nurse, children's Use: Never used  Substance and Sexual Activity   Alcohol use: No    Alcohol/week: 1.0 standard drink of alcohol    Types: 1 Glasses of wine per week   Drug use: No   Sexual activity: Not Currently  Other Topics Concern   Not on file  Social History Narrative   Pt lives in [place] with his wife. They have one son who does not live in the area. Pt previously worked for [employer] but is retired. He used to [employer]. He has never smoke and drinks 4-5 alcoholic beverages per week. No illicit drug use.   Social Determinants of Health   Financial Resource Strain: Not on file  Food Insecurity: Not on file  Transportation Needs: Not on file  Physical Activity: Not on file  Stress: Not on file  Social Connections: Not on file     Family History: The patient's family history includes CAD in his cousin; Cancer in his brother; Coronary artery disease in his father. There is no history of Colon cancer, Pancreatic cancer, Rectal cancer, or Stomach cancer.  ROS:   Please see the history of present illness.    All other systems  reviewed and are negative.  EKGs/Labs/Other Studies Reviewed:    The following studies were reviewed today: EKG reveals sinus rhythm poor anterior forces and nonspecific ST-T changes   Recent Labs: No results found for requested labs within last 365 days.  Recent Lipid Panel    Component Value Date/Time   CHOL 141 10/19/2020 1332   TRIG 95 10/19/2020 1332   HDL 51 10/19/2020 1332   CHOLHDL 2.8 10/19/2020 1332   LDLCALC 72 10/19/2020 1332    Physical Exam:    VS:  BP 138/72   Pulse 75   Ht 6' (1.829  m)   Wt (!) 328 lb (148.8 kg)   SpO2 97%   BMI 44.48 kg/m     Wt Readings from Last 3 Encounters:  11/21/21 (!) 328 lb (148.8 kg)  03/28/21 (!) 328 lb (148.8 kg)  09/03/20 (!) 317 lb 1.3 oz (143.8 kg)     GEN: Patient is in no acute distress HEENT: Normal NECK: No JVD; No carotid bruits LYMPHATICS: No lymphadenopathy CARDIAC: Hear sounds regular, 2/6 systolic murmur at the apex. RESPIRATORY:  Clear to auscultation without rales, wheezing or rhonchi  ABDOMEN: Soft, non-tender, non-distended MUSCULOSKELETAL:  No edema; No deformity  SKIN: Warm and dry NEUROLOGIC:  Alert and oriented x 3 PSYCHIATRIC:  Normal affect   Signed, Jenean Lindau, MD  11/21/2021 8:51 AM    [place]

## 2021-11-21 NOTE — H&P (View-Only) (Signed)
Cardiology Office Note:    Date:  11/21/2021   ID:  Stephen Medina, DOB November 30, 1950, MRN [id number]  PCP:  Sharilyn Sites, MD  Cardiologist:  Jenean Lindau, MD   Referring MD: Sharilyn Sites, MD    ASSESSMENT:    1. Coronary artery disease involving native coronary artery of native heart without angina pectoris   2. Ascending aorta dilatation (HCC)   3. Morbid obesity ([place])   4. Mixed dyslipidemia   5. Angina pectoris ([place])    PLAN:    In order of problems listed above:  Angina pectoris and coronary artery disease: Secondary prevention stressed with patient.  Importance of compliance with diet medication stressed any vocalized understanding.  His symptoms are concerning.  Following recommendations were made to him.  Sublingual nitroglycerin prescription was sent, its protocol and 911 protocol explained and the patient vocalized understanding questions were answered to the patient's satisfaction.In view of the patient's symptoms, I discussed with the patient options for evaluation. Invasive and noninvasive options were given to the patient. I discussed stress testing and coronary angiography and left heart catheterization at length. Benefits, pros and cons of each approach were discussed at length. Patient had multiple questions which were answered to the patient's satisfaction. Patient opted for invasive evaluation and we will set up for coronary angiography and left heart catheterization. Further recommendations will be made based on the findings with coronary angiography. In the interim if the patient has any significant symptoms in hospital to the nearest emergency room. Essential hypertension: Blood pressure stable and diet was emphasized.  Lifestyle modification urged. Mixed dyslipidemia: Lipids were reviewed he is fasting and will have complete blood work today.  He gives history of vitamin D deficiency and we will check this for him.  We will also do hemoglobin A1c. Morbid obesity:  Weight reduction stressed diet was emphasized.  Risks of obesity explained and he promises to do better.  He will be seen in follow-up appointment after coronary angiography.   Medication Adjustments/Labs and Tests Ordered: Current medicines are reviewed at length with the patient today.  Concerns regarding medicines are outlined above.  No orders of the defined types were placed in this encounter.  No orders of the defined types were placed in this encounter.    No chief complaint on file.    History of Present Illness:    Stephen Medina is a 71 y.o. male.  Patient has past medical history of coronary artery disease post stenting about 20 years ago at [facility], ascending aortic dilatation/aneurysm, essential hypertension, dyslipidemia.  He leads a sedentary lifestyle because of orthopedic issues.  Patient mentions to me that on exertion now he has some chest tightness and sweating sensation which is happening in the past couple of weeks.  No orthopnea or PND.  At the time of my evaluation, the patient is alert awake oriented and in no distress.  Past Medical History:  Diagnosis Date   Ankle arthritis 10/20/2018   Ascending aorta dilatation ([place]) 06/01/2019   Body mass index (BMI) 40.0-44.9, adult ([place]) 11/29/2019   CAD (coronary artery disease) 08/01/2013   Cath 2005  normal Left main, scattered irregularities LAD, 80% stenosis ostial Diag 1, scattered irregularities CFX, scattered irregularities RCA  Taxus stent March 2005 Diagonal    Disc displacement, lumbar    Disorder of ankle joint 10/20/2018   Displacement of lumbar intervertebral disc without myelopathy 08/23/2015   Essential (primary) hypertension 02/02/2014   Foot ulceration ([place])  left   GERD (gastroesophageal reflux disease)    History of heart artery stent 05/14/2002   History of right hip replacement 04/14/2019   History of ulcer disease 10/19/2013   HNP (herniated nucleus pulposus), lumbar 09/03/2015    Hyperlipidemia    Hypertension    Inflammation of sacroiliac joint ([place]) 12/24/2017   Lumbar spondylosis 04/01/2013   Lumbosacral spondylosis without myelopathy 01/18/2013   Mixed dyslipidemia 11/27/2017   Morbid obesity ([place])    Neuromuscular disorder ([place])    neuropathy left foot    Nonhealing ulcer of left lower extremity ([place]) 04/16/2015   OA (osteoarthritis) of hip 06/08/2019   Other spondylosis with radiculopathy, lumbar region 11/29/2019   Pain in right knee 05/28/2017   Preop cardiovascular exam 06/01/2019   Primary localized osteoarthrosis of ankle and foot 06/30/2018   Primary osteoarthritis of right hip 06/08/2019   Sacroiliitis ([place]) 01/27/2020   Spinal stenosis of lumbar region 03/17/2013   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   Ulcer of left foot ([place]) 12/04/2014   Ulcer of toe of left foot ([place]) 04/16/2015   Wears glasses     Past Surgical History:  Procedure Laterality Date   APPENDECTOMY     BACK SURGERY     BONE EXOSTOSIS EXCISION Left 01/28/2018   Procedure: Exostectomy left first metatarsal base and medial cuneiform;  Surgeon: Wylene Simmer, MD;  Location: [place];  Service: Orthopedics;  Laterality: Left;  32mn   CARDIAC CATHETERIZATION     05    1 stent   dr tWynonia Lawman  COLONOSCOPY     ESOPHAGOGASTRODUODENOSCOPY N/A 08/02/2013   Procedure: ESOPHAGOGASTRODUODENOSCOPY (EGD);  Surgeon: MLadene Artist MD;  [facility];  Service: Endoscopy;  Laterality: N/A;   FOOT ARTHRODESIS Left 05/22/2016   Procedure: Left Subtalar and Talonavicular Joint Arthrodesis;  Surgeon: HWylene Simmer MD;  [facility] [facility]  Service: Orthopedics;  Laterality: Left;   FOOT SURGERY     GASTROCNEMIUS RECESSION Left 05/22/2016   Procedure: Left Gastroc Recession;  Surgeon: HWylene Simmer MD;  [facility]  Service: Orthopedics;  Laterality: Left;   GRAFT APPLICATION N/A 61/06/1094  Procedure:  INTEGRA GRAFT APPLICATION;  Surgeon: MTrula Slade DPM;  [facility]  Service: Podiatry;  Laterality: N/A;   LEFT HEART CATHETERIZATION WITH CORONARY ANGIOGRAM N/A 02/09/2014   Procedure: LEFT HEART CATHETERIZATION WITH CORONARY ANGIOGRAM;  Surgeon: Peter M JMartinique MD;  [facility];  Service: Cardiovascular;  Laterality: N/A;   LUMBAR LAMINECTOMY     x2   LUMBAR LAMINECTOMY/DECOMPRESSION MICRODISCECTOMY Left 09/03/2015   Procedure: MICRODISCECTOMY L5-S1 LEFT;  Surgeon: KAshok Pall MD;  [facility];  Service: Neurosurgery;  Laterality: Left;   MICRODISCECTOMY L5-S1 LEFT   LUMBAR LAMINECTOMY/DECOMPRESSION MICRODISCECTOMY Left 02/13/2016   Procedure: MICRODISCECTOMY LUMBAR FIVE- SACRAL ONE LEFT;  Surgeon: KAshok Pall MD;  [place] [facility]  Service: Neurosurgery;  Laterality: Left;   TONSILLECTOMY     TOTAL HIP ARTHROPLASTY Right 06/08/2019   Procedure: TOTAL HIP ARTHROPLASTY ANTERIOR APPROACH;  Surgeon: AGaynelle Arabian MD;  [facility];  Service: Orthopedics;  Laterality: Right;  1015m   WOUND DEBRIDEMENT N/A 06/22/2015   Procedure: DEBRIDEMENT WOUND;  Surgeon: MaTrula SladeDPM;  [facility] Service: [facility]ry;  Laterality: N/A;    Current Medications: Current Meds  Medication Sig   acetaminophen (TYLENOL) 325 MG tablet Take 650 mg by mouth every 4 (  four) hours as needed for pain.   aspirin EC 81 MG tablet Take 1 tablet (81 mg total) by mouth daily. Swallow whole.   Cholecalciferol (VITAMIN D-3) 125 MCG (5000 UT) TABS Take 500 mg by mouth daily.   clopidogrel (PLAVIX) 75 MG tablet TAKE 1 TABLET BY MOUTH EVERY DAY   ezetimibe (ZETIA) 10 MG tablet TAKE 1 TABLET BY MOUTH DAILY   fluticasone (FLONASE) 50 MCG/ACT nasal spray Place 2 sprays into both nostrils as needed for allergies or rhinitis.   levocetirizine (XYZAL) 5 MG tablet Take 5 mg by mouth daily.   meloxicam (MOBIC) 15 MG tablet Take 15 mg by mouth  daily.    metoprolol succinate (TOPROL-XL) 25 MG 24 hr tablet TAKE 1 TABLET BY MOUTH EVERY DAY   nitroGLYCERIN (NITROSTAT) 0.4 MG SL tablet Place 0.4 mg under the tongue every 5 (five) minutes as needed for chest pain.   pantoprazole (PROTONIX) 40 MG tablet Take 1 tablet (40 mg total) by mouth 2 (two) times daily.   rosuvastatin (CRESTOR) 40 MG tablet TAKE 1/2 TABLET BY MOUTH EVERY DAY   zinc gluconate 50 MG tablet Take 50 mg by mouth daily.     Allergies:   Silicone, Adhesive [tape], Latex, and Other   Social History   Socioeconomic History   Marital status: Married    Spouse name: Not on file   Number of children: Not on file   Years of education: Not on file   Highest education level: Not on file  Occupational History   Not on file  Tobacco Use   Smoking status: Never   Smokeless tobacco: Former    Types: Nurse, children's Use: Never used  Substance and Sexual Activity   Alcohol use: No    Alcohol/week: 1.0 standard drink of alcohol    Types: 1 Glasses of wine per week   Drug use: No   Sexual activity: Not Currently  Other Topics Concern   Not on file  Social History Narrative   Pt lives in [place] with his wife. They have one son who does not live in the area. Pt previously worked for [employer] but is retired. He used to [employer]. He has never smoke and drinks 4-5 alcoholic beverages per week. No illicit drug use.   Social Determinants of Health   Financial Resource Strain: Not on file  Food Insecurity: Not on file  Transportation Needs: Not on file  Physical Activity: Not on file  Stress: Not on file  Social Connections: Not on file     Family History: The patient's family history includes CAD in his cousin; Cancer in his brother; Coronary artery disease in his father. There is no history of Colon cancer, Pancreatic cancer, Rectal cancer, or Stomach cancer.  ROS:   Please see the history of present illness.    All other systems  reviewed and are negative.  EKGs/Labs/Other Studies Reviewed:    The following studies were reviewed today: EKG reveals sinus rhythm poor anterior forces and nonspecific ST-T changes   Recent Labs: No results found for requested labs within last 365 days.  Recent Lipid Panel    Component Value Date/Time   CHOL 141 10/19/2020 1332   TRIG 95 10/19/2020 1332   HDL 51 10/19/2020 1332   CHOLHDL 2.8 10/19/2020 1332   LDLCALC 72 10/19/2020 1332    Physical Exam:    VS:  BP 138/72   Pulse 75   Ht 6' (1.829  m)   Wt (!) 328 lb (148.8 kg)   SpO2 97%   BMI 44.48 kg/m     Wt Readings from Last 3 Encounters:  11/21/21 (!) 328 lb (148.8 kg)  03/28/21 (!) 328 lb (148.8 kg)  09/03/20 (!) 317 lb 1.3 oz (143.8 kg)     GEN: Patient is in no acute distress HEENT: Normal NECK: No JVD; No carotid bruits LYMPHATICS: No lymphadenopathy CARDIAC: Hear sounds regular, 2/6 systolic murmur at the apex. RESPIRATORY:  Clear to auscultation without rales, wheezing or rhonchi  ABDOMEN: Soft, non-tender, non-distended MUSCULOSKELETAL:  No edema; No deformity  SKIN: Warm and dry NEUROLOGIC:  Alert and oriented x 3 PSYCHIATRIC:  Normal affect   Signed, Jenean Lindau, MD  11/21/2021 8:51 AM    [place]

## 2021-11-21 NOTE — Patient Instructions (Addendum)
Medication Instructions:  Hold Mobic the day of procedure Nitroglycerin Use nitroglycerin 1 tablet placed under the tongue at the first sign of chest pain or an angina attack. 1 tablet may be used every 5 minutes as needed, for up to 15 minutes. Do not take more than 3 tablets in 15 minutes. If pain persist call 911 or go to the nearest ED.       *If you need a refill on your cardiac medications before your next appointment, please call your pharmacy*   Lab Work: 2nd Floor - [address] BMP, CBC, TSH, LFT's Lipids, A1C, Vit D - today  If you have labs (blood work) drawn today and your tests are completely normal, you will receive your results only by: [place] (if you have MyChart) OR A paper copy in the mail If you have any lab test that is abnormal or we need to change your treatment, we will call you to review the results.   Testing/Procedures:        Cardiac/Peripheral Catheterization   You are scheduled for a Cardiac Catheterization on Tuesday, November 14 with Dr. Larae Grooms.  1. Please arrive at the Main Entrance A at [facility]: [place], [place] [id number] on November 14 at 10:00 AM (This time is two hours before your procedure to ensure your preparation). Free valet parking service is available. You will check in at ADMITTING. The support person will be asked to wait in the waiting room.  It is OK to have someone drop you off and come back when you are ready to be discharged.        Special note: Every effort is made to have your procedure done on time. Please understand that emergencies sometimes delay scheduled procedures.   . 2. Diet: Do not eat solid foods after midnight.  You may have clear liquids until 5 AM the day of the procedure.  3. Labs: You will need to have blood drawn on Thursday, November 9 at [facility]: [address], [place], [place]. You do not need to be fasting.  4. Medication instructions in  preparation for your procedure:   Contrast Allergy: No   *For reference purposes while preparing patient instructions.   Delete this med list prior to printing instructions for patient.*    Stop taking, Mobic (Meloxicam) Tuesday, November 14,  On the morning of your procedure, take Aspirin 81 mg and any morning medicines NOT listed above.  You may use sips of water.  5. Plan to go home the same day, you will only stay overnight if medically necessary. 6. You MUST have a responsible adult to drive you home. 7. An adult MUST be with you the first 24 hours after you arrive home. 8. Bring a current list of your medications, and the last time and date medication taken. 9. Bring ID and current insurance cards. 10.Please wear clothes that are easy to get on and off and wear slip-on shoes.  Thank you for allowing [place] to care for you!   -- [facility]     Follow-Up: At [facility], you and your health needs are our priority.  As part of our continuing mission to provide you with exceptional heart care, we have created designated Provider Care Teams.  These Care Teams include your primary Cardiologist (physician) and Advanced Practice Providers (APPs -  Physician Assistants and Nurse Practitioners) who all work together to provide you with the  care you need, when you need it.  We recommend signing up for the patient portal called "MyChart".  Sign up information is provided on this After Visit Summary.  MyChart is used to connect with patients for Virtual Visits (Telemedicine).  Patients are able to view lab/test results, encounter notes, upcoming appointments, etc.  Non-urgent messages can be sent to your provider as well.   To learn more about what you can do with MyChart, go to [email or web address].    Your next appointment:   9 month(s)  The format for your next appointment:   In Person  Provider:   Jyl Heinz, MD   Other  Instructions  Coronary Angiogram With Stent Coronary angiogram with stent placement is a procedure to widen or open a narrow blood vessel of the heart (coronary artery). Arteries may become blocked by cholesterol buildup (plaques) in the lining of the artery wall. When a coronary artery becomes partially blocked, blood flow to that area decreases. This may lead to chest pain or a heart attack (myocardial infarction). A stent is a small piece of metal that looks like mesh or spring. Stent placement may be done as treatment after a heart attack, or to prevent a heart attack if a blocked artery is found by a coronary angiogram. Let your health care provider know about: Any allergies you have, including allergies to medicines or contrast dye. All medicines you are taking, including vitamins, herbs, eye drops, creams, and over-the-counter medicines. Any problems you or family members have had with anesthetic medicines. Any blood disorders you have. Any surgeries you have had. Any medical conditions you have, including kidney problems or kidney failure. Whether you are pregnant or may be pregnant. Whether you are breastfeeding. What are the risks? Generally, this is a safe procedure. However, serious problems may occur, including: Damage to nearby structures or organs, such as the heart, blood vessels, or kidneys. A return of blockage. Bleeding, infection, or bruising at the insertion site. A collection of blood under the skin (hematoma) at the insertion site. A blood clot in another part of the body. Allergic reaction to medicines or dyes. Bleeding into the abdomen (retroperitoneal bleeding). Stroke (rare). Heart attack (rare). What happens before the procedure? Staying hydrated Follow instructions from your health care provider about hydration, which may include: Up to 2 hours before the procedure - you may continue to drink clear liquids, such as water, clear fruit juice, black coffee, and  plain tea.    Eating and drinking restrictions Follow instructions from your health care provider about eating and drinking, which may include: 8 hours before the procedure - stop eating heavy meals or foods, such as meat, fried foods, or fatty foods. 6 hours before the procedure - stop eating light meals or foods, such as toast or cereal. 2 hours before the procedure - stop drinking clear liquids. Medicines Ask your health care provider about: Changing or stopping your regular medicines. This is especially important if you are taking diabetes medicines or blood thinners. Taking medicines such as aspirin and ibuprofen. These medicines can thin your blood. Do not take these medicines unless your health care provider tells you to take them. Generally, aspirin is recommended before a thin tube, called a catheter, is passed through a blood vessel and inserted into the heart (cardiac catheterization). Taking over-the-counter medicines, vitamins, herbs, and supplements. General instructions Do not use any products that contain nicotine or tobacco for at least 4 weeks before the procedure. These products include cigarettes, e-cigarettes,  and chewing tobacco. If you need help quitting, ask your health care provider. Plan to have someone take you home from the hospital or clinic. If you will be going home right after the procedure, plan to have someone with you for 24 hours. You may have tests and imaging procedures. Ask your health care provider: How your insertion site will be marked. Ask which artery will be used for the procedure. What steps will be taken to help prevent infection. These may include: Removing hair at the insertion site. Washing skin with a germ-killing soap. Taking antibiotic medicine. What happens during the procedure? An IV will be inserted into one of your veins. Electrodes may be placed on your chest to monitor your heart rate during the procedure. You will be given one or  more of the following: A medicine to help you relax (sedative). A medicine to numb the area (local anesthetic) for catheter insertion. A small incision will be made for catheter insertion. The catheter will be inserted into an artery using a guide wire. The location may be in your groin, your wrist, or the fold of your arm (near your elbow). An X-ray procedure (fluoroscopy) will be used to help guide the catheter to the opening of the heart arteries. A dye will be injected into the catheter. X-rays will be taken. The dye helps to show where any narrowing or blockages are located in the arteries. Tell your health care provider if you have chest pain or trouble breathing. A tiny wire will be guided to the blocked spot, and a balloon will be inflated to make the artery wider. The stent will be expanded to crush the plaques into the wall of the vessel. The stent will hold the area open and improve the blood flow. Most stents have a drug coating to reduce the risk of the stent narrowing over time. The artery may be made wider using a drill, laser, or other tools that remove plaques. The catheter will be removed when the blood flow improves. The stent will stay where it was placed, and the lining of the artery will grow over it. A bandage (dressing) will be placed on the insertion site. Pressure will be applied to stop bleeding. The IV will be removed. This procedure may vary among health care providers and hospitals.    What happens after the procedure? Your blood pressure, heart rate, breathing rate, and blood oxygen level will be monitored until you leave the hospital or clinic. If the procedure is done through the leg, you will lie flat in bed for a few hours or for as long as told by your health care provider. You will be instructed not to bend or cross your legs. The insertion site and the pulse in your foot or wrist will be checked often. You may have more blood tests, X-rays, and a test that  records the electrical activity of your heart (electrocardiogram, or ECG). Do not drive for 24 hours if you were given a sedative during your procedure. Summary Coronary angiogram with stent placement is a procedure to widen or open a narrowed coronary artery. This is done to treat heart problems. Before the procedure, let your health care provider know about all the medical conditions and surgeries you have or have had. This is a safe procedure. However, some problems may occur, including damage to nearby structures or organs, bleeding, blood clots, or allergies. Follow your health care provider's instructions about eating, drinking, medicines, and other lifestyle changes, such as  quitting tobacco use before the procedure. This information is not intended to replace advice given to you by your health care provider. Make sure you discuss any questions you have with your health care provider. Document Revised: 07/21/2018 Document Reviewed: 07/21/2018 Elsevier Patient Education  [place].

## 2021-11-22 LAB — TSH: TSH: 1.67 u[IU]/mL (ref 0.450–4.500)

## 2021-11-22 LAB — BASIC METABOLIC PANEL
BUN/Creatinine Ratio: 7 — ABNORMAL LOW (ref 10–24)
BUN: 6 mg/dL — ABNORMAL LOW (ref 8–27)
CO2: 19 mmol/L — ABNORMAL LOW (ref 20–29)
Calcium: 8.6 mg/dL (ref 8.6–10.2)
Chloride: 104 mmol/L (ref 96–106)
Creatinine, Ser: 0.87 mg/dL (ref 0.76–1.27)
Glucose: 116 mg/dL — ABNORMAL HIGH (ref 70–99)
Potassium: 4.1 mmol/L (ref 3.5–5.2)
Sodium: 138 mmol/L (ref 134–144)
eGFR: 93 mL/min/{1.73_m2} (ref >59)

## 2021-11-22 LAB — CBC
Hematocrit: 40.5 % (ref 37.5–51.0)
Hemoglobin: 13.6 g/dL (ref 13.0–17.7)
MCH: 29.8 pg (ref 26.6–33.0)
MCHC: 33.6 g/dL (ref 31.5–35.7)
MCV: 89 fL (ref 79–97)
RBC: 4.57 x10E6/uL (ref 4.14–5.80)
RDW: 13.6 % (ref 11.6–15.4)
WBC: 5.3 10*3/uL (ref 3.4–10.8)

## 2021-11-22 LAB — HEMOGLOBIN A1C
Est. average glucose Bld gHb Est-mCnc: 134 mg/dL
Hgb A1c MFr Bld: 6.3 % — ABNORMAL HIGH (ref 4.8–5.6)

## 2021-11-22 LAB — HEPATIC FUNCTION PANEL
ALT: 17 IU/L (ref 0–44)
AST: 18 IU/L (ref 0–40)
Albumin: 4 g/dL (ref 3.9–4.9)
Alkaline Phosphatase: 85 IU/L (ref 44–121)
Bilirubin Total: 0.6 mg/dL (ref 0.0–1.2)
Bilirubin, Direct: 0.15 mg/dL (ref 0.00–0.40)
Total Protein: 6.4 g/dL (ref 6.0–8.5)

## 2021-11-22 LAB — LIPID PANEL
Chol/HDL Ratio: 3.8 ratio (ref 0.0–5.0)
Cholesterol, Total: 184 mg/dL (ref 100–199)
HDL: 49 mg/dL (ref >39)
LDL Chol Calc (NIH): 113 mg/dL — ABNORMAL HIGH (ref 0–99)
Triglycerides: 123 mg/dL (ref 0–149)
VLDL Cholesterol Cal: 22 mg/dL (ref 5–40)

## 2021-11-22 LAB — VITAMIN D 25 HYDROXY (VIT D DEFICIENCY, FRACTURES): Vit D, 25-Hydroxy: 36.2 ng/mL (ref 30.0–100.0)

## 2021-11-25 ENCOUNTER — Telehealth: Payer: Self-pay | Admitting: *Deleted

## 2021-11-25 NOTE — Telephone Encounter (Addendum)
Cardiac Catheterization scheduled at [facility] for: Tuesday November 26, 2021 12 Noon Arrival time and place: [facility] Main Entrance A at: 9:30 AM-needs platelet count  Nothing to eat after midnight prior to procedure, clear liquids until 5 AM day of procedure.  Medication instructions: -Usual morning medications can be taken with sips of water including aspirin 81 mg and Plavix 75 mg.  Confirmed patient has responsible adult to drive home post procedure and be with patient first 24 hours after arriving home.  Patient reports no new symptoms concerning for COVID-19 in the past 10 days.  Reviewed procedure instructions with patient.

## 2021-11-26 ENCOUNTER — Ambulatory Visit (HOSPITAL_COMMUNITY)
Admission: RE | Admit: 2021-11-26 | Discharge: 2021-11-26 | Disposition: A | Discharge status: Home / Self Care | Payer: Medicare Other | Attending: Interventional Cardiology | Admitting: Interventional Cardiology

## 2021-11-26 ENCOUNTER — Other Ambulatory Visit: Payer: Self-pay

## 2021-11-26 ENCOUNTER — Telehealth: Payer: Self-pay

## 2021-11-26 ENCOUNTER — Encounter (HOSPITAL_COMMUNITY)
Admission: RE | Disposition: A | Discharge status: Home / Self Care | Payer: Medicare Other | Source: Home / Self Care | Attending: Interventional Cardiology

## 2021-11-26 DIAGNOSIS — I209 Angina pectoris, unspecified: Secondary | ICD-10-CM

## 2021-11-26 DIAGNOSIS — Z955 Presence of coronary angioplasty implant and graft: Secondary | ICD-10-CM | POA: Insufficient documentation

## 2021-11-26 DIAGNOSIS — E785 Hyperlipidemia, unspecified: Secondary | ICD-10-CM | POA: Diagnosis present

## 2021-11-26 DIAGNOSIS — E782 Mixed hyperlipidemia: Secondary | ICD-10-CM | POA: Diagnosis not present

## 2021-11-26 DIAGNOSIS — Z6841 Body Mass Index (BMI) 40.0 and over, adult: Secondary | ICD-10-CM | POA: Diagnosis not present

## 2021-11-26 DIAGNOSIS — I2584 Coronary atherosclerosis due to calcified coronary lesion: Secondary | ICD-10-CM | POA: Diagnosis not present

## 2021-11-26 DIAGNOSIS — Z87891 Personal history of nicotine dependence: Secondary | ICD-10-CM | POA: Insufficient documentation

## 2021-11-26 DIAGNOSIS — I25118 Atherosclerotic heart disease of native coronary artery with other forms of angina pectoris: Secondary | ICD-10-CM | POA: Diagnosis not present

## 2021-11-26 DIAGNOSIS — I77810 Thoracic aortic ectasia: Secondary | ICD-10-CM

## 2021-11-26 DIAGNOSIS — I10 Essential (primary) hypertension: Secondary | ICD-10-CM | POA: Diagnosis not present

## 2021-11-26 DIAGNOSIS — I2510 Atherosclerotic heart disease of native coronary artery without angina pectoris: Secondary | ICD-10-CM

## 2021-11-26 DIAGNOSIS — Z01812 Encounter for preprocedural laboratory examination: Secondary | ICD-10-CM

## 2021-11-26 HISTORY — PX: LEFT HEART CATH AND CORONARY ANGIOGRAPHY: CATH118249

## 2021-11-26 HISTORY — PX: CORONARY ULTRASOUND/IVUS: CATH118244

## 2021-11-26 HISTORY — PX: CORONARY STENT INTERVENTION: CATH118234

## 2021-11-26 LAB — PLATELET COUNT: Platelets: 215 10*3/uL (ref 150–400)

## 2021-11-26 LAB — POCT ACTIVATED CLOTTING TIME
Activated Clotting Time: 275 seconds
Activated Clotting Time: 335 seconds

## 2021-11-26 SURGERY — LEFT HEART CATH AND CORONARY ANGIOGRAPHY
Anesthesia: LOCAL

## 2021-11-26 MED ORDER — CLOPIDOGREL BISULFATE 300 MG PO TABS
ORAL_TABLET | ORAL | Status: AC
  Filled 2021-11-26: qty 1

## 2021-11-26 MED ORDER — NITROGLYCERIN 1 MG/10 ML FOR IR/CATH LAB
INTRA_ARTERIAL | Status: AC
  Filled 2021-11-26: qty 10

## 2021-11-26 MED ORDER — SODIUM CHLORIDE 0.9% FLUSH: 3.0000 mL | Freq: Two times a day (BID) | INTRAVENOUS | Status: DC

## 2021-11-26 MED ORDER — HYDRALAZINE HCL 20 MG/ML IJ SOLN: 10.0000 mg | INTRAMUSCULAR | Status: DC | PRN

## 2021-11-26 MED ORDER — NITROGLYCERIN 1 MG/10 ML FOR IR/CATH LAB
INTRA_ARTERIAL | Status: DC | PRN
  Administered 2021-11-26 (×2): 200 ug via INTRACORONARY

## 2021-11-26 MED ORDER — ASPIRIN 81 MG PO CHEW: 81.0000 mg | CHEWABLE_TABLET | ORAL | Status: DC

## 2021-11-26 MED ORDER — LIDOCAINE HCL (PF) 1 % IJ SOLN
INTRAMUSCULAR | Status: AC
  Filled 2021-11-26: qty 30

## 2021-11-26 MED ORDER — MIDAZOLAM HCL 2 MG/2ML IJ SOLN
INTRAMUSCULAR | Status: DC | PRN
  Administered 2021-11-26: 1 mg via INTRAVENOUS
  Administered 2021-11-26: 2 mg via INTRAVENOUS
  Administered 2021-11-26 (×3): 1 mg via INTRAVENOUS

## 2021-11-26 MED ORDER — SODIUM CHLORIDE 0.9 % IV SOLN: 250.0000 mL | INTRAVENOUS | Status: DC | PRN

## 2021-11-26 MED ORDER — ONDANSETRON HCL 4 MG/2ML IJ SOLN: 4.0000 mg | Freq: Four times a day (QID) | INTRAMUSCULAR | Status: DC | PRN

## 2021-11-26 MED ORDER — FENTANYL CITRATE (PF) 100 MCG/2ML IJ SOLN
INTRAMUSCULAR | Status: DC | PRN
  Administered 2021-11-26 (×4): 25 ug via INTRAVENOUS

## 2021-11-26 MED ORDER — LABETALOL HCL 5 MG/ML IV SOLN: 10.0000 mg | INTRAVENOUS | Status: DC | PRN

## 2021-11-26 MED ORDER — SODIUM CHLORIDE 0.9% FLUSH: 3.0000 mL | INTRAVENOUS | Status: DC | PRN

## 2021-11-26 MED ORDER — VERAPAMIL HCL 2.5 MG/ML IV SOLN
INTRAVENOUS | Status: DC | PRN
  Administered 2021-11-26: 10 mL via INTRA_ARTERIAL

## 2021-11-26 MED ORDER — HEPARIN (PORCINE) IN NACL 1000-0.9 UT/500ML-% IV SOLN
INTRAVENOUS | Status: DC | PRN
  Administered 2021-11-26 (×2): 500 mL

## 2021-11-26 MED ORDER — SODIUM CHLORIDE 0.9 % WEIGHT BASED INFUSION
3.0000 mL/kg/h | INTRAVENOUS | Status: AC
  Administered 2021-11-26: 3 mL/kg/h via INTRAVENOUS

## 2021-11-26 MED ORDER — LIDOCAINE HCL (PF) 1 % IJ SOLN
INTRAMUSCULAR | Status: DC | PRN
  Administered 2021-11-26: 2 mL

## 2021-11-26 MED ORDER — HEPARIN (PORCINE) IN NACL 1000-0.9 UT/500ML-% IV SOLN
INTRAVENOUS | Status: AC
  Filled 2021-11-26: qty 1000

## 2021-11-26 MED ORDER — SODIUM CHLORIDE 0.9 % WEIGHT BASED INFUSION: 1.0000 mL/kg/h | INTRAVENOUS | Status: DC

## 2021-11-26 MED ORDER — HEPARIN SODIUM (PORCINE) 1000 UNIT/ML IJ SOLN
INTRAMUSCULAR | Status: DC | PRN
  Administered 2021-11-26: 3000 [IU] via INTRAVENOUS
  Administered 2021-11-26: 6000 [IU] via INTRAVENOUS
  Administered 2021-11-26: 8000 [IU] via INTRAVENOUS

## 2021-11-26 MED ORDER — VERAPAMIL HCL 2.5 MG/ML IV SOLN
INTRAVENOUS | Status: AC
  Filled 2021-11-26: qty 2

## 2021-11-26 MED ORDER — IOHEXOL 350 MG/ML SOLN
INTRAVENOUS | Status: DC | PRN
  Administered 2021-11-26: 125 mL

## 2021-11-26 MED ORDER — CLOPIDOGREL BISULFATE 300 MG PO TABS
ORAL_TABLET | ORAL | Status: DC | PRN
  Administered 2021-11-26: 300 mg via ORAL

## 2021-11-26 MED ORDER — FENTANYL CITRATE (PF) 100 MCG/2ML IJ SOLN
INTRAMUSCULAR | Status: AC
  Filled 2021-11-26: qty 2

## 2021-11-26 MED ORDER — MIDAZOLAM HCL 2 MG/2ML IJ SOLN
INTRAMUSCULAR | Status: AC
  Filled 2021-11-26: qty 2

## 2021-11-26 MED ORDER — ACETAMINOPHEN 325 MG PO TABS: 650.0000 mg | ORAL_TABLET | ORAL | Status: DC | PRN

## 2021-11-26 MED ORDER — HEPARIN SODIUM (PORCINE) 1000 UNIT/ML IJ SOLN
INTRAMUSCULAR | Status: AC
  Filled 2021-11-26: qty 10

## 2021-11-26 MED ORDER — CLOPIDOGREL BISULFATE 75 MG PO TABS: 75.0000 mg | ORAL_TABLET | Freq: Every day | ORAL | Status: DC

## 2021-11-26 MED ORDER — ASPIRIN 81 MG PO CHEW: 81.0000 mg | CHEWABLE_TABLET | Freq: Every day | ORAL | Status: DC

## 2021-11-26 MED ORDER — SODIUM CHLORIDE 0.9 % IV SOLN: INTRAVENOUS | Status: AC

## 2021-11-26 SURGICAL SUPPLY — 26 items
BALL SAPPHIRE NC24 3.75X12 (BALLOONS) ×1
BALLN SAPPHIRE 3.0X15 (BALLOONS) ×1
BALLN SCOREFLEX 2.50X10 (BALLOONS) ×1
BALLOON SAPPHIRE 3.0X15 (BALLOONS) IMPLANT
BALLOON SAPPHIRE NC24 3.75X12 (BALLOONS) IMPLANT
BALLOON SCOREFLEX 2.50X10 (BALLOONS) IMPLANT
CATH INFINITI 5 FR JL3.5 (CATHETERS) IMPLANT
CATH INFINITI JR4 5F (CATHETERS) IMPLANT
CATH LAUNCHER 6FR AL.75 (CATHETERS) IMPLANT
CATH OPTICROSS HD (CATHETERS) IMPLANT
DEVICE RAD COMP TR BAND LRG (VASCULAR PRODUCTS) IMPLANT
ELECT DEFIB PAD ADLT CADENCE (PAD) IMPLANT
GLIDESHEATH SLEND SS 6F .021 (SHEATH) IMPLANT
GUIDEWIRE INQWIRE 1.5J.035X260 (WIRE) IMPLANT
INQWIRE 1.5J .035X260CM (WIRE) ×1
KIT ENCORE 26 ADVANTAGE (KITS) IMPLANT
KIT HEART LEFT (KITS) ×1 IMPLANT
KIT HEMO VALVE WATCHDOG (MISCELLANEOUS) IMPLANT
PACK CARDIAC CATHETERIZATION (CUSTOM PROCEDURE TRAY) ×1 IMPLANT
SLED PULL BACK IVUS (MISCELLANEOUS) IMPLANT
STENT SYNERGY XD 3.50X20 (Permanent Stent) IMPLANT
SYNERGY XD 3.50X20 (Permanent Stent) ×1 IMPLANT
TRANSDUCER W/STOPCOCK (MISCELLANEOUS) ×1 IMPLANT
TUBING CIL FLEX 10 FLL-RA (TUBING) ×1 IMPLANT
WIRE ASAHI PROWATER 180CM (WIRE) IMPLANT
WIRE RUNTHROUGH .014X180CM (WIRE) IMPLANT

## 2021-11-26 NOTE — Progress Notes (Addendum)
Seen pt from [phone] pt was educated on stent location,  restrictions, s/s of infection, Aspirin & Plavix med use, ex guidelines (walking +cycling), s/s to stop exercising, NTG use and calling 911, heart healthy diet, and CRPII. Pt is being referred to CRPII at [facility].   Christen Bame, MS EP 1:58 PM 11/26/2021

## 2021-11-26 NOTE — Discharge Instructions (Signed)

## 2021-11-26 NOTE — Discharge Summary (Cosign Needed Addendum)
Discharge Summary for Same Day PCI   Patient ID: Stephen Medina MRN: [id number]; DOB: 31-Jan-1950  Admit date: 11/26/2021 Discharge date: 11/26/2021  Primary Care Provider: Sharilyn Sites, MD  Primary Cardiologist: Jenean Lindau, MD  Primary Electrophysiologist:  None   Discharge Diagnoses    Principal Problem:   Angina pectoris [facility]) Active Problems:   CAD (coronary artery disease)   Morbid obesity ([place])   Essential (primary) hypertension   Hyperlipidemia  Diagnostic Studies/Procedures    Cardiac Catheterization 11/26/2021:  CORONARY STENT INTERVENTION  Intravascular Ultrasound/IVUS  LEFT HEART CATH AND CORONARY ANGIOGRAPHY   Conclusion      Patent stent with mild restenosis.   Mid LAD lesion is 25% stenosed.   Prox RCA lesion is 80% stenosed.   A drug-eluting stent was successfully placed using a SYNERGY XD 3.50X20 postdilated to 3.75 mm and optimized with intravascular ultrasound.   Post intervention, there is a 0% residual stenosis.   The left ventricular systolic function is normal.   LV end diastolic pressure is normal.   The left ventricular ejection fraction is 55-65% by visual estimate.   There is no aortic valve stenosis.   He will need dual antiplatelet therapy uninterrupted for 6 months.  Continue aggressive secondary prevention.  Plan for same-day discharge.   Diagnostic Dominance: Right  Intervention       History of Present Illness     Stephen Medina is a 71 y.o. male with past medical history of coronary artery disease s/p stenting about 20 years ago at [facility], ascending aortic dilatation/aneurysm, essential hypertension, dyslipidemia presented for outpatient cath.   He leads a sedentary lifestyle because of orthopedic issues.    Low risk stress test 05/2019.  Recently having exertional chest tightness and diaphoresis. Symptoms were concerning for angina. Cardiac catheterization was arranged for further evaluation.  Hospital  Course     The patient underwent cardiac cath as noted above with 80%pRCA stenosis s/p DES PCI Patent diagonal stent. Plan for DAPT with ASA/Plavix for at least 6 months. The patient was seen by cardiac rehab while in short stay. There were no observed complications post cath. Radial cath site was re-evaluated prior to discharge and found to be stable without any complications. Instructions/precautions regarding cath site care were given prior to discharge.  Stephen Medina was seen by Dr. Irish Lack and determined stable for discharge home. Follow up with our office has been arranged. Medications are listed below. Pertinent changes include None.  Cath/PCI Registry Performance & Quality Measures: Aspirin prescribed? - Yes ADP Receptor Inhibitor (Plavix/Clopidogrel, Brilinta/Ticagrelor or Effient/Prasugrel) prescribed (includes medically managed patients)? - Yes High Intensity Statin (Lipitor 40-6m or Crestor 20-476m prescribed? - Yes For EF <40%, was ACEI/ARB prescribed? - Not Applicable (EF >/= 4024%For EF <40%, Aldosterone Antagonist (Spironolactone or Eplerenone) prescribed? - Not Applicable (EF >/= 4082%Cardiac Rehab Phase II ordered (Included Medically managed Patients)? - Yes   Discharge Vitals Blood pressure 118/81, pulse (!) 57, temperature 98.1 F (36.7 C), temperature source Oral, resp. rate 18, height 6' (1.829 m), weight (!) 147.4 kg, SpO2 94 %.  Filed Weights   11/26/21 1019  Weight: (!) 147.4 kg    Last Labs & Radiologic Studies    CBC Recent Labs    11/26/21 1045  PLT 215   _____________  CARDIAC CATHETERIZATION  Result Date: 11/26/2021   Patent stent with mild restenosis.   Mid LAD lesion is 25% stenosed.   Prox RCA lesion is 80% stenosed.  A drug-eluting stent was successfully placed using a SYNERGY XD 3.50X20 postdilated to 3.75 mm and optimized with intravascular ultrasound.   Post intervention, there is a 0% residual stenosis.   The left ventricular systolic  function is normal.   LV end diastolic pressure is normal.   The left ventricular ejection fraction is 55-65% by visual estimate.   There is no aortic valve stenosis. He will need dual antiplatelet therapy uninterrupted for 6 months.  Continue aggressive secondary prevention.  Plan for same-day discharge.    Disposition   Pt is being discharged home today in good condition.  Follow-up Plans & Appointments     Follow-up Information     Revankar, Reita Cliche, MD Follow up on 12/27/2021.   Specialty: Cardiology Why: _0 :20am for hospital follow up Contact information: [address] [place]  [place] [id number] [phone]                Discharge Instructions     AMB Referral to Cardiac Rehabilitation - Phase II   Complete by: As directed    Diagnosis: Coronary Stents   After initial evaluation and assessments completed: Virtual Based Care may be provided alone or in conjunction with Phase 2 Cardiac Rehab based on patient barriers.: Yes   Intensive Cardiac Rehabilitation (ICR) [place] location only OR Traditional Cardiac Rehabilitation (TCR) *If criteria for ICR are not met will enroll in TCR [facility] only): Yes   Diet - low sodium heart healthy   Complete by: As directed    Discharge instructions   Complete by: As directed    No driving for 48 hours. No lifting over 5 lbs for 1 week. No sexual activity for 1 week. You may return to work on 12/02/21. Keep procedure site clean & dry. If you notice increased pain, swelling, bleeding or pus, call/return!  You may shower, but no soaking baths/hot tubs/pools for 1 week.   Increase activity slowly   Complete by: As directed         Discharge Medications   Allergies as of 11/26/2021       Reactions   Silicone    Other reaction(s): Other (See Comments) Blisters after fabric tape.  Paper tape ok. Tegaderm OK.   Adhesive [tape] Other (See Comments)   Blisters.  Paper tape ok.   Latex    Other reaction(s): Hives/Skin Rash    Other Other (See Comments)   Blisters.  Paper tape ok.        Medication List     STOP taking these medications    acetaminophen 325 MG tablet Commonly known as: TYLENOL   Vitamin D-3 125 MCG (5000 UT) Tabs       TAKE these medications    aspirin EC 81 MG tablet Take 1 tablet (81 mg total) by mouth daily. Swallow whole. What changed: additional instructions   clopidogrel 75 MG tablet Commonly known as: PLAVIX TAKE 1 TABLET BY MOUTH EVERY DAY   ezetimibe 10 MG tablet Commonly known as: ZETIA TAKE 1 TABLET BY MOUTH DAILY   fluticasone 50 MCG/ACT nasal spray Commonly known as: FLONASE Place 2 sprays into both nostrils as needed for allergies or rhinitis.   levocetirizine 5 MG tablet Commonly known as: XYZAL Take 5 mg by mouth daily.   meloxicam 15 MG tablet Commonly known as: MOBIC Take 15 mg by mouth daily.   metoprolol succinate 25 MG 24 hr tablet Commonly known as: TOPROL-XL TAKE 1 TABLET BY MOUTH EVERY DAY  nitroGLYCERIN 0.4 MG SL tablet Commonly known as: NITROSTAT Place 1 tablet (0.4 mg total) under the tongue every 5 (five) minutes as needed for chest pain.   pantoprazole 40 MG tablet Commonly known as: PROTONIX Take 1 tablet (40 mg total) by mouth 2 (two) times daily.   rosuvastatin 40 MG tablet Commonly known as: CRESTOR TAKE 1/2 TABLET BY MOUTH EVERY DAY   zinc gluconate 50 MG tablet Take 50 mg by mouth daily.           Allergies Allergies  Allergen Reactions   Silicone     Other reaction(s): Other (See Comments) Blisters after fabric tape.  Paper tape ok. Tegaderm OK.   Adhesive [Tape] Other (See Comments)    Blisters.  Paper tape ok.   Latex     Other reaction(s): Hives/Skin Rash   Other Other (See Comments)    Blisters.  Paper tape ok.    Outstanding Labs/Studies   N/A  Duration of Discharge Encounter   Greater than 30 minutes including physician time.  SignedCrista Luria [place], PA 11/26/2021, 1:48 PM   I have  examined the patient and reviewed assessment and plan and discussed with patient.  Agree with above as stated.    Right radial site is stable.  No hematoma.  OK for sam day discharge.  Continue DAPT.  Consider clopidogrel monotherapy after 6 months.  Larae Grooms

## 2021-11-26 NOTE — Interval H&P Note (Signed)
Cath Lab Visit (complete for each Cath Lab visit)  Clinical Evaluation Leading to the Procedure:   ACS: No.  Non-ACS:    Anginal Classification: CCS III  Anti-ischemic medical therapy: Minimal Therapy (1 class of medications)  Non-Invasive Test Results: No non-invasive testing performed  Prior CABG: No previous CABG   Prior PCI   History and Physical Interval Note:  11/26/2021 11:19 AM  Stephen Medina  has presented today for surgery, with the diagnosis of angina.  The various methods of treatment have been discussed with the patient and family. After consideration of risks, benefits and other options for treatment, the patient has consented to  Procedure(s): LEFT HEART CATH AND CORONARY ANGIOGRAPHY (N/A) as a surgical intervention.  The patient's history has been reviewed, patient examined, no change in status, stable for surgery.  I have reviewed the patient's chart and labs.  Questions were answered to the patient's satisfaction.     Larae Grooms

## 2021-11-26 NOTE — Telephone Encounter (Signed)
-----   Message from Jenean Lindau, MD sent at 11/22/2021 11:57 AM EST ----- Lipids are elevated.  He needs to diet strictly and recheck in 3 months.  Liver lipid.  Copy primary care Jenean Lindau, MD 11/22/2021 11:56 AM

## 2021-11-27 ENCOUNTER — Encounter (HOSPITAL_COMMUNITY): Payer: Self-pay | Admitting: Interventional Cardiology

## 2021-11-27 DIAGNOSIS — D239 Other benign neoplasm of skin, unspecified: Secondary | ICD-10-CM | POA: Diagnosis not present

## 2021-11-27 DIAGNOSIS — L570 Actinic keratosis: Secondary | ICD-10-CM | POA: Diagnosis not present

## 2021-12-20 ENCOUNTER — Other Ambulatory Visit: Payer: Self-pay

## 2021-12-26 DIAGNOSIS — M25572 Pain in left ankle and joints of left foot: Secondary | ICD-10-CM | POA: Diagnosis not present

## 2021-12-26 DIAGNOSIS — M19072 Primary osteoarthritis, left ankle and foot: Secondary | ICD-10-CM | POA: Diagnosis not present

## 2021-12-26 DIAGNOSIS — M960 Pseudarthrosis after fusion or arthrodesis: Secondary | ICD-10-CM | POA: Diagnosis not present

## 2021-12-26 DIAGNOSIS — M216X2 Other acquired deformities of left foot: Secondary | ICD-10-CM | POA: Diagnosis not present

## 2021-12-26 DIAGNOSIS — M2142 Flat foot [pes planus] (acquired), left foot: Secondary | ICD-10-CM | POA: Diagnosis not present

## 2021-12-26 DIAGNOSIS — Z981 Arthrodesis status: Secondary | ICD-10-CM | POA: Diagnosis not present

## 2021-12-27 ENCOUNTER — Encounter: Payer: Self-pay | Admitting: Cardiology

## 2021-12-27 ENCOUNTER — Ambulatory Visit: Payer: Medicare Other | Attending: Cardiology | Admitting: Cardiology

## 2021-12-27 VITALS — BP 112/70 | HR 74 | Ht 72.0 in | Wt 331.1 lb

## 2021-12-27 DIAGNOSIS — E782 Mixed hyperlipidemia: Secondary | ICD-10-CM

## 2021-12-27 DIAGNOSIS — I77810 Thoracic aortic ectasia: Secondary | ICD-10-CM | POA: Diagnosis not present

## 2021-12-27 DIAGNOSIS — I209 Angina pectoris, unspecified: Secondary | ICD-10-CM

## 2021-12-27 DIAGNOSIS — I10 Essential (primary) hypertension: Secondary | ICD-10-CM

## 2021-12-27 DIAGNOSIS — I2510 Atherosclerotic heart disease of native coronary artery without angina pectoris: Secondary | ICD-10-CM

## 2021-12-27 NOTE — Progress Notes (Signed)
Cardiology Office Note:    Date:  12/27/2021   ID:  Stephen Medina, DOB [birth year], MRN [id number]  PCP:  Sharilyn Sites, MD  Cardiologist:  Jenean Lindau, MD   Referring MD: Sharilyn Sites, MD    ASSESSMENT:    1. Angina pectoris ([place])   2. Ascending aorta dilatation ([facility])   3. Coronary artery disease involving native coronary artery of native heart without angina pectoris   4. Essential (primary) hypertension   5. Mixed dyslipidemia   6. Morbid obesity ([place])    PLAN:    In order of problems listed above:  Coronary artery disease: Secondary prevention stressed with the patient.  Importance of compliance with diet medication stressed and vocalized understanding.  He was advised to ambulate to the best of my ability. Ascending aortic aneurysm: We will do a CT scan to follow-up on this 1. Mixed dyslipidemia: On lipid-lowering medications.  Not to goal.  Will get liver lipid check in the next few days.  Diet emphasized. Morbid obesity: Weight reduction stressed and he promises to do better.  Risks of obesity revisited. Essential hypertension: Stable at this time.  Lifestyle modification urged. Patient will be seen in follow-up appointment in 6 months or earlier if the patient has any concerns    Medication Adjustments/Labs and Tests Ordered: Current medicines are reviewed at length with the patient today.  Concerns regarding medicines are outlined above.  No orders of the defined types were placed in this encounter.  No orders of the defined types were placed in this encounter.    No chief complaint on file.    History of Present Illness:    Stephen Medina is a 71 y.o. male.  Patient has past medical history of coronary artery disease and was evaluated for chest pain and underwent coronary angiography and stenting.  Details are mentioned below.  He has history of essential hypertension dyslipidemia diabetes mellitus and morbid obesity.  He has orthopedic issues for which  she recently sedentary.  At the time of my evaluation, the patient is alert awake oriented and in no distress.  Past Medical History:  Diagnosis Date   Angina pectoris ([place]) 11/21/2021   Ankle arthritis 10/20/2018   Ascending aorta dilatation ([place]) 06/01/2019   Body mass index (BMI) 40.0-44.9, adult ([place]) 11/29/2019   CAD (coronary artery disease) 08/01/2013   Cath 2005  normal Left main, scattered irregularities LAD, 80% stenosis ostial Diag 1, scattered irregularities CFX, scattered irregularities RCA  Taxus stent March 2005 Diagonal    Disc displacement, lumbar    Disorder of ankle joint 10/20/2018   Displacement of lumbar intervertebral disc without myelopathy 08/23/2015   Essential (primary) hypertension 02/02/2014   Foot ulceration ([place])    left   GERD (gastroesophageal reflux disease)    History of heart artery stent 05/14/2002   History of right hip replacement 04/14/2019   History of ulcer disease 10/19/2013   HNP (herniated nucleus pulposus), lumbar 09/03/2015   Hyperlipidemia    Hypertension    Inflammation of sacroiliac joint ([place]) 12/24/2017   Lumbar spondylosis 04/01/2013   Lumbosacral spondylosis without myelopathy 01/18/2013   Mixed dyslipidemia 11/27/2017   Morbid obesity ([place])    Neuromuscular disorder ([place])    neuropathy left foot    Nonhealing ulcer of left lower extremity ([place]) 04/16/2015   OA (osteoarthritis) of hip 06/08/2019   Other spondylosis with radiculopathy, lumbar region 11/29/2019   Pain in right knee 05/28/2017   Preop cardiovascular exam 06/01/2019  Primary localized osteoarthrosis of ankle and foot 06/30/2018   Primary osteoarthritis of right hip 06/08/2019   Sacroiliitis ([place]) 01/27/2020   Spinal stenosis of lumbar region 03/17/2013   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   Ulcer of left foot ([place]) 12/04/2014   Ulcer of toe of left foot ([place]) 04/16/2015   Wears glasses     Past Surgical History:   Procedure Laterality Date   APPENDECTOMY     BACK SURGERY     BONE EXOSTOSIS EXCISION Left 01/28/2018   Procedure: Exostectomy left first metatarsal base and medial cuneiform;  Surgeon: Wylene Simmer, MD;  Location: [place];  Service: Orthopedics;  Laterality: Left;  42mn   CARDIAC CATHETERIZATION     05    1 stent   dr tWynonia Lawman  COLONOSCOPY     CORONARY STENT INTERVENTION N/A 11/26/2021   Procedure: CORONARY STENT INTERVENTION;  Surgeon: VJettie Booze MD;  [facility];  Service: Cardiovascular;  Laterality: N/A;   ESOPHAGOGASTRODUODENOSCOPY N/A 08/02/2013   Procedure: ESOPHAGOGASTRODUODENOSCOPY (EGD);  Surgeon: MLadene Artist MD;  [facility]  Service: Endoscopy;  Laterality: N/A;   FOOT ARTHRODESIS Left 05/22/2016   Procedure: Left Subtalar and Talonavicular Joint Arthrodesis;  Surgeon: HWylene Simmer MD;  [facility]  Service: Orthopedics;  Laterality: Left;   FOOT SURGERY     GASTROCNEMIUS RECESSION Left 05/22/2016   Procedure: Left Gastroc Recession;  Surgeon: HWylene Simmer MD;  [place]  Service: Orthopedics;  Laterality: Left;   GRAFT APPLICATION N/A 63/06/6438  Procedure: INTEGRA GRAFT APPLICATION;  Surgeon: MTrula Slade DPM;  [place] [facility]  Service: Podiatry;  Laterality: N/A;   INTRAVASCULAR ULTRASOUND/IVUS N/A 11/26/2021   Procedure: Intravascular Ultrasound/IVUS;  Surgeon: VJettie Booze MD;  [facility];  Service: Cardiovascular;  Laterality: N/A;   LEFT HEART CATH AND CORONARY ANGIOGRAPHY N/A 11/26/2021   Procedure: LEFT HEART CATH AND CORONARY ANGIOGRAPHY;  Surgeon: VJettie Booze MD;  [facility];  Service: Cardiovascular;  Laterality: N/A;   LEFT HEART CATHETERIZATION WITH CORONARY ANGIOGRAM N/A 02/09/2014   Procedure: LEFT HEART CATHETERIZATION WITH CORONARY ANGIOGRAM;  Surgeon: Peter M JMartinique  MD;  [facility];  Service: Cardiovascular;  Laterality: N/A;   LUMBAR LAMINECTOMY     x2   LUMBAR LAMINECTOMY/DECOMPRESSION MICRODISCECTOMY Left 09/03/2015   Procedure: MICRODISCECTOMY L5-S1 LEFT;  Surgeon: KAshok Pall MD;  [facility];  Service: Neurosurgery;  Laterality: Left;   MICRODISCECTOMY L5-S1 LEFT   LUMBAR LAMINECTOMY/DECOMPRESSION MICRODISCECTOMY Left 02/13/2016   Procedure: MICRODISCECTOMY LUMBAR FIVE- SACRAL ONE LEFT;  Surgeon: KAshok Pall MD;  [place]  Service: Neurosurgery;  Laterality: Left;   TONSILLECTOMY     TOTAL HIP ARTHROPLASTY Right 06/08/2019   Procedure: TOTAL HIP ARTHROPLASTY ANTERIOR APPROACH;  Surgeon: AGaynelle Arabian MD;  [facility];  Service: Orthopedics;  Laterality: Right;  1014m   WOUND DEBRIDEMENT N/A 06/22/2015   Procedure: DEBRIDEMENT WOUND;  Surgeon: MaTrula SladeDPM;  [place] Service: Podiatry;  Laterality: N/A;    Current Medications: Current Meds  Medication Sig   aspirin EC 81 MG tablet Take 1 tablet (81 mg total) by mouth daily. Swallow whole.   clopidogrel (PLAVIX) 75 MG tablet TAKE 1 TABLET BY MOUTH EVERY DAY   ezetimibe (ZETIA) 10 MG tablet TAKE 1 TABLET BY MOUTH DAILY   fluticasone (  FLONASE) 50 MCG/ACT nasal spray Place 2 sprays into both nostrils as needed for allergies or rhinitis.   levocetirizine (XYZAL) 5 MG tablet Take 5 mg by mouth daily.   meloxicam (MOBIC) 15 MG tablet Take 15 mg by mouth daily.    metoprolol succinate (TOPROL-XL) 25 MG 24 hr tablet TAKE 1 TABLET BY MOUTH EVERY DAY   nitroGLYCERIN (NITROSTAT) 0.4 MG SL tablet Place 1 tablet (0.4 mg total) under the tongue every 5 (five) minutes as needed for chest pain.   pantoprazole (PROTONIX) 40 MG tablet Take 1 tablet (40 mg total) by mouth 2 (two) times daily.   rosuvastatin (CRESTOR) 40 MG tablet TAKE 1/2 TABLET BY MOUTH EVERY DAY   zinc gluconate 50 MG tablet Take 50 mg by mouth daily.     Allergies:    Silicone, Adhesive [tape], Latex, and Other   Social History   Socioeconomic History   Marital status: Married    Spouse name: Not on file   Number of children: Not on file   Years of education: Not on file   Highest education level: Not on file  Occupational History   Not on file  Tobacco Use   Smoking status: Never   Smokeless tobacco: Former    Types: Nurse, children's Use: Never used  Substance and Sexual Activity   Alcohol use: No    Alcohol/week: 1.0 standard drink of alcohol    Types: 1 Glasses of wine per week   Drug use: No   Sexual activity: Not Currently  Other Topics Concern   Not on file  Social History Narrative   Pt lives in [place] with his wife. They have one son who does not live in the area. Pt previously worked for [employer] but is retired. He used to [employer]. He has never smoke and drinks 4-5 alcoholic beverages per week. No illicit drug use.   Social Determinants of Health   Financial Resource Strain: Not on file  Food Insecurity: Not on file  Transportation Needs: Not on file  Physical Activity: Not on file  Stress: Not on file  Social Connections: Not on file     Family History: The patient's family history includes CAD in his cousin; Cancer in his brother; Coronary artery disease in his father. There is no history of Colon cancer, Pancreatic cancer, Rectal cancer, or Stomach cancer.  ROS:   Please see the history of present illness.    All other systems reviewed and are negative.  EKGs/Labs/Other Studies Reviewed:    The following studies were reviewed today: I discussed my findings with the patient at length.   Recent Labs: 11/21/2021: ALT 17; BUN 6; Creatinine, Ser 0.87; Hemoglobin 13.6; Potassium 4.1; Sodium 138; TSH 1.670 11/26/2021: Platelets 215  Recent Lipid Panel    Component Value Date/Time   CHOL 184 11/21/2021 0920   TRIG 123 11/21/2021 0920   HDL 49 11/21/2021 0920   CHOLHDL 3.8 11/21/2021  0920   LDLCALC 113 (H) 11/21/2021 0920    Physical Exam:    VS:  BP 112/70   Pulse 74   Ht 6' (1.829 m)   Wt (!) 331 lb 1.3 oz (150.2 kg)   SpO2 94%   BMI 44.90 kg/m     Wt Readings from Last 3 Encounters:  12/27/21 (!) 331 lb 1.3 oz (150.2 kg)  11/26/21 (!) 325 lb (147.4 kg)  11/21/21 (!) 328 lb (148.8 kg)     GEN: Patient  is in no acute distress HEENT: Normal NECK: No JVD; No carotid bruits LYMPHATICS: No lymphadenopathy CARDIAC: Hear sounds regular, 2/6 systolic murmur at the apex. RESPIRATORY:  Clear to auscultation without rales, wheezing or rhonchi  ABDOMEN: Soft, non-tender, non-distended MUSCULOSKELETAL:  No edema; No deformity  SKIN: Warm and dry NEUROLOGIC:  Alert and oriented x 3 PSYCHIATRIC:  Normal affect   Signed, Jenean Lindau, MD  12/27/2021 8:31 AM    [place]

## 2021-12-27 NOTE — Patient Instructions (Signed)
Medication Instructions:  Your physician recommends that you continue on your current medications as directed. Please refer to the Current Medication list given to you today.  *If you need a refill on your cardiac medications before your next appointment, please call your pharmacy*   Lab Work: Your physician recommends that you return for lab work in:   Labs in the next few days: BMP, LFT, Lipids  If you have labs (blood work) drawn today and your tests are completely normal, you will receive your results only by: [place] (if you have [place]) OR A paper copy in the mail If you have any lab test that is abnormal or we need to change your treatment, we will call you to review the results.   Testing/Procedures: Non-Cardiac CT scanning, (CAT scanning), is a noninvasive, special x-ray that produces cross-sectional images of the body using x-rays and a computer. CT scans help physicians diagnose and treat medical conditions. For some CT exams, a contrast material is used to enhance visibility in the area of the body being studied. CT scans provide greater clarity and reveal more details than regular x-ray exams.    Follow-Up: At [facility], you and your health needs are our priority.  As part of our continuing mission to provide you with exceptional heart care, we have created designated Provider Care Teams.  These Care Teams include your primary Cardiologist (physician) and Advanced Practice Providers (APPs -  Physician Assistants and Nurse Practitioners) who all work together to provide you with the care you need, when you need it.  We recommend signing up for the patient portal called "MyChart".  Sign up information is provided on this After Visit Summary.  MyChart is used to connect with patients for Virtual Visits (Telemedicine).  Patients are able to view lab/test results, encounter notes, upcoming appointments, etc.  Non-urgent messages can be sent to your provider as  well.   To learn more about what you can do with MyChart, go to [email or web address].    Your next appointment:   6 month(s)  The format for your next appointment:   In Person  Provider:   Jyl Heinz, MD    Other Instructions None  Important Information About Sugar

## 2022-01-07 ENCOUNTER — Ambulatory Visit (HOSPITAL_BASED_OUTPATIENT_CLINIC_OR_DEPARTMENT_OTHER)
Admission: RE | Admit: 2022-01-07 | Discharge: 2022-01-07 | Disposition: A | Discharge status: Home / Self Care | Payer: Medicare Other | Source: Ambulatory Visit | Attending: Cardiology | Admitting: Cardiology

## 2022-01-07 DIAGNOSIS — I2510 Atherosclerotic heart disease of native coronary artery without angina pectoris: Secondary | ICD-10-CM

## 2022-01-07 DIAGNOSIS — I209 Angina pectoris, unspecified: Secondary | ICD-10-CM | POA: Diagnosis present

## 2022-01-07 DIAGNOSIS — E782 Mixed hyperlipidemia: Secondary | ICD-10-CM

## 2022-01-07 DIAGNOSIS — R918 Other nonspecific abnormal finding of lung field: Secondary | ICD-10-CM | POA: Diagnosis not present

## 2022-01-07 DIAGNOSIS — I77810 Thoracic aortic ectasia: Secondary | ICD-10-CM | POA: Diagnosis not present

## 2022-01-07 DIAGNOSIS — I10 Essential (primary) hypertension: Secondary | ICD-10-CM | POA: Diagnosis not present

## 2022-01-07 DIAGNOSIS — I700 Atherosclerosis of aorta: Secondary | ICD-10-CM | POA: Diagnosis not present

## 2022-01-15 DIAGNOSIS — I10 Essential (primary) hypertension: Secondary | ICD-10-CM | POA: Diagnosis not present

## 2022-01-15 DIAGNOSIS — I2510 Atherosclerotic heart disease of native coronary artery without angina pectoris: Secondary | ICD-10-CM | POA: Diagnosis not present

## 2022-01-15 DIAGNOSIS — I209 Angina pectoris, unspecified: Secondary | ICD-10-CM | POA: Diagnosis not present

## 2022-01-15 DIAGNOSIS — I77810 Thoracic aortic ectasia: Secondary | ICD-10-CM | POA: Diagnosis not present

## 2022-01-15 DIAGNOSIS — E782 Mixed hyperlipidemia: Secondary | ICD-10-CM | POA: Diagnosis not present

## 2022-01-16 LAB — LIPID PANEL
Chol/HDL Ratio: 2.3 ratio (ref 0.0–5.0)
Cholesterol, Total: 111 mg/dL (ref 100–199)
HDL: 49 mg/dL (ref >39)
LDL Chol Calc (NIH): 47 mg/dL (ref 0–99)
Triglycerides: 69 mg/dL (ref 0–149)
VLDL Cholesterol Cal: 15 mg/dL (ref 5–40)

## 2022-01-16 LAB — BASIC METABOLIC PANEL
BUN/Creatinine Ratio: 10 (ref 10–24)
BUN: 9 mg/dL (ref 8–27)
CO2: 22 mmol/L (ref 20–29)
Calcium: 9.1 mg/dL (ref 8.6–10.2)
Chloride: 101 mmol/L (ref 96–106)
Creatinine, Ser: 0.87 mg/dL (ref 0.76–1.27)
Glucose: 123 mg/dL — ABNORMAL HIGH (ref 70–99)
Potassium: 4.8 mmol/L (ref 3.5–5.2)
Sodium: 139 mmol/L (ref 134–144)
eGFR: 93 mL/min/{1.73_m2} (ref >59)

## 2022-01-16 LAB — HEPATIC FUNCTION PANEL
ALT: 18 IU/L (ref 0–44)
AST: 22 IU/L (ref 0–40)
Albumin: 4.2 g/dL (ref 3.9–4.9)
Alkaline Phosphatase: 85 IU/L (ref 44–121)
Bilirubin Total: 0.8 mg/dL (ref 0.0–1.2)
Bilirubin, Direct: 0.25 mg/dL (ref 0.00–0.40)
Total Protein: 6.6 g/dL (ref 6.0–8.5)

## 2022-01-21 DIAGNOSIS — K219 Gastro-esophageal reflux disease without esophagitis: Secondary | ICD-10-CM

## 2022-01-21 HISTORY — DX: Gastro-esophageal reflux disease without esophagitis: K21.9

## 2022-05-06 DIAGNOSIS — J0100 Acute maxillary sinusitis, unspecified: Secondary | ICD-10-CM | POA: Diagnosis not present

## 2022-05-06 DIAGNOSIS — M159 Polyosteoarthritis, unspecified: Secondary | ICD-10-CM | POA: Diagnosis not present

## 2022-05-06 DIAGNOSIS — I2510 Atherosclerotic heart disease of native coronary artery without angina pectoris: Secondary | ICD-10-CM | POA: Diagnosis not present

## 2022-05-06 DIAGNOSIS — G709 Myoneural disorder, unspecified: Secondary | ICD-10-CM | POA: Diagnosis not present

## 2022-05-06 DIAGNOSIS — M461 Sacroiliitis, not elsewhere classified: Secondary | ICD-10-CM | POA: Diagnosis not present

## 2022-05-06 DIAGNOSIS — E119 Type 2 diabetes mellitus without complications: Secondary | ICD-10-CM | POA: Diagnosis not present

## 2022-05-06 DIAGNOSIS — I10 Essential (primary) hypertension: Secondary | ICD-10-CM | POA: Diagnosis not present

## 2022-05-06 DIAGNOSIS — I712 Thoracic aortic aneurysm, without rupture, unspecified: Secondary | ICD-10-CM | POA: Diagnosis not present

## 2022-05-13 DIAGNOSIS — M25572 Pain in left ankle and joints of left foot: Secondary | ICD-10-CM | POA: Diagnosis not present

## 2022-05-13 DIAGNOSIS — M216X2 Other acquired deformities of left foot: Secondary | ICD-10-CM | POA: Diagnosis not present

## 2022-05-13 DIAGNOSIS — Z981 Arthrodesis status: Secondary | ICD-10-CM | POA: Diagnosis not present

## 2022-05-13 DIAGNOSIS — M960 Pseudarthrosis after fusion or arthrodesis: Secondary | ICD-10-CM | POA: Diagnosis not present

## 2022-05-20 ENCOUNTER — Ambulatory Visit: Payer: Medicare Other | Attending: Cardiology | Admitting: Cardiology

## 2022-05-20 ENCOUNTER — Encounter: Payer: Self-pay | Admitting: Cardiology

## 2022-05-20 VITALS — BP 126/74 | HR 67 | Ht 72.0 in | Wt 329.0 lb

## 2022-05-20 DIAGNOSIS — E782 Mixed hyperlipidemia: Secondary | ICD-10-CM | POA: Diagnosis not present

## 2022-05-20 DIAGNOSIS — I2510 Atherosclerotic heart disease of native coronary artery without angina pectoris: Secondary | ICD-10-CM | POA: Diagnosis not present

## 2022-05-20 DIAGNOSIS — I77810 Thoracic aortic ectasia: Secondary | ICD-10-CM

## 2022-05-20 DIAGNOSIS — I10 Essential (primary) hypertension: Secondary | ICD-10-CM

## 2022-05-20 NOTE — Patient Instructions (Signed)
Medication Instructions:  Your physician recommends that you continue on your current medications as directed. Please refer to the Current Medication list given to you today.  *If you need a refill on your cardiac medications before your next appointment, please call your pharmacy*   Lab Work: None ordered If you have labs (blood work) drawn today and your tests are completely normal, you will receive your results only by: MyChart Message (if you have MyChart) OR A paper copy in the mail If you have any lab test that is abnormal or we need to change your treatment, we will call you to review the results.   Testing/Procedures: None ordered   Follow-Up: At [facility], you and your health needs are our priority.  As part of our continuing mission to provide you with exceptional heart care, we have created designated Provider Care Teams.  These Care Teams include your primary Cardiologist (physician) and Advanced Practice Providers (APPs -  Physician Assistants and Nurse Practitioners) who all work together to provide you with the care you need, when you need it.  We recommend signing up for the patient portal called "MyChart".  Sign up information is provided on this After Visit Summary.  MyChart is used to connect with patients for Virtual Visits (Telemedicine).  Patients are able to view lab/test results, encounter notes, upcoming appointments, etc.  Non-urgent messages can be sent to your provider as well.   To learn more about what you can do with MyChart, go to [email or web address].    Your next appointment:   6 month(s)  The format for your next appointment:   In Person  Provider:   Rajan Revankar, MD    Other Instructions none  Important Information About Sugar       

## 2022-05-20 NOTE — Progress Notes (Signed)
Cardiology Office Note:    Date:  05/20/2022   ID:  Stephen Medina, DOB [birth year], MRN [id number]  PCP:  Assunta Found, MD  Cardiologist:  Garwin Brothers, MD   Referring MD: Assunta Found, MD    ASSESSMENT:    1. Coronary artery disease involving native coronary artery of native heart without angina pectoris   2. Ascending aorta dilatation (HCC)   3. Essential (primary) hypertension   4. Mixed dyslipidemia   5. Morbid obesity (HCC)    PLAN:    In order of problems listed above:  Coronary artery disease: Secondary prevention stressed with the patient.  Importance of compliance with diet medication stressed any vocalized understanding.  He was advised to ambulate to the best of his ability. Essential hypertension: Blood pressure stable and diet was emphasized. Mixed dyslipidemia: On lipid-lowering medications followed by primary care.  Lipids done recently were fine and I discussed this with him at length. Morbid obesity: Weight reduction stressed and diet was emphasized and he promises to do better.   Medication Adjustments/Labs and Tests Ordered: Current medicines are reviewed at length with the patient today.  Concerns regarding medicines are outlined above.  No orders of the defined types were placed in this encounter.  No orders of the defined types were placed in this encounter.    No chief complaint on file.    History of Present Illness:    Stephen Medina is a 72 y.o. male.  Patient has past medical history of coronary artery disease with stenting in the recent past, essential hypertension, mixed dyslipidemia, morbid obesity.  He denies any problems at this time and takes care of activities of daily living.  He occasionally will have a jabbing sensation for a second or so.  No chest pain dyspnea on exertion.  He ambulates with crutches because of orthopedic issues involving his foot.  At the time of my evaluation, the patient is alert awake oriented and in no  distress.  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/2018   Ascending aorta dilatation (HCC) 06/01/2019   Body mass index (BMI) 40.0-44.9, adult (HCC) 11/29/2019   CAD (coronary artery disease) 08/01/2013   Cath 2005  normal Left main, scattered irregularities LAD, 80% stenosis ostial Diag 1, scattered irregularities CFX, scattered irregularities RCA  Taxus stent March 2005 Diagonal    Disc displacement, lumbar    Disorder of ankle joint 10/20/2018   Displacement of lumbar intervertebral disc without myelopathy 08/23/2015   Essential (primary) hypertension 02/02/2014   Foot ulceration (HCC)    left   GERD (gastroesophageal reflux disease)    History of heart artery stent 05/14/2002   History of right hip replacement 04/14/2019   History of ulcer disease 10/19/2013   HNP (herniated nucleus pulposus), lumbar 09/03/2015   Hyperlipidemia    Hypertension    Inflammation of sacroiliac joint (HCC) 12/24/2017   Laryngopharyngeal reflux 01/21/2022   Lumbar spondylosis 04/01/2013   Lumbosacral spondylosis without myelopathy 01/18/2013   Mixed dyslipidemia 11/27/2017   Morbid obesity (HCC)    Neuromuscular disorder (HCC)    neuropathy left foot    Nonhealing ulcer of left lower extremity (HCC) 04/16/2015   OA (osteoarthritis) of hip 06/08/2019   Other spondylosis with radiculopathy, lumbar region 11/29/2019   Pain in right knee 05/28/2017   Preop cardiovascular exam 06/01/2019   Primary localized osteoarthrosis of ankle and foot 06/30/2018   Primary osteoarthritis of right hip 06/08/2019  Spinal stenosis of lumbar region 03/17/2013   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   Ulcer of left foot (HCC) 12/04/2014   Ulcer of toe of left foot (HCC) 04/16/2015   Wears glasses     Past Surgical History:  Procedure Laterality Date   APPENDECTOMY     BACK SURGERY     BONE EXOSTOSIS EXCISION Left  01/28/2018   Procedure: Exostectomy left first metatarsal base and medial cuneiform;  Surgeon: Toni Arthurs, MD;  Location: [facility];  Service: Orthopedics;  Laterality: Left;    CARDIAC CATHETERIZATION     05    1 stent   dr Donnie Aho   COLONOSCOPY     CORONARY STENT INTERVENTION N/A 11/26/2021   Procedure: CORONARY STENT INTERVENTION;  Surgeon: Corky Crafts, MD;  Location: [facility] INVASIVE CV LAB;  Service: Cardiovascular;  Laterality: N/A;   CORONARY ULTRASOUND/IVUS N/A 11/26/2021   Procedure: Intravascular Ultrasound/IVUS;  Surgeon: Corky Crafts, MD;  Location: [facility] INVASIVE CV LAB;  Service: Cardiovascular;  Laterality: N/A;   ESOPHAGOGASTRODUODENOSCOPY N/A 08/02/2013   Procedure: ESOPHAGOGASTRODUODENOSCOPY (EGD);  Surgeon: Meryl Dare, MD;  Location: [facility] ENDOSCOPY;  Service: Endoscopy;  Laterality: N/A;   FOOT ARTHRODESIS Left 05/22/2016   Procedure: Left Subtalar and Talonavicular Joint Arthrodesis;  Surgeon: Toni Arthurs, MD;  Location: [facility];  Service: Orthopedics;  Laterality: Left;   FOOT SURGERY     GASTROCNEMIUS RECESSION Left 05/22/2016   Procedure: Left Gastroc Recession;  Surgeon: Toni Arthurs, MD;  Location: [facility];  Service: Orthopedics;  Laterality: Left;   GRAFT APPLICATION N/A 06/22/2015   Procedure: INTEGRA GRAFT APPLICATION;  Surgeon: Vivi Barrack, DPM;  Location:  SURGERY CENTER;  Service: Podiatry;  Laterality: N/A;   LEFT HEART CATH AND CORONARY ANGIOGRAPHY N/A 11/26/2021   Procedure: LEFT HEART CATH AND CORONARY ANGIOGRAPHY;  Surgeon: Corky Crafts, MD;  Location: [facility] INVASIVE CV LAB;  Service: Cardiovascular;  Laterality: N/A;   LEFT HEART CATHETERIZATION WITH CORONARY ANGIOGRAM N/A 02/09/2014   Procedure: LEFT HEART CATHETERIZATION WITH CORONARY ANGIOGRAM;  Surgeon: Peter M Swaziland, MD;  Location: [facility] CATH LAB;  Service: Cardiovascular;  Laterality: N/A;   LUMBAR LAMINECTOMY     x2    LUMBAR LAMINECTOMY/DECOMPRESSION MICRODISCECTOMY Left 09/03/2015   Procedure: MICRODISCECTOMY L5-S1 LEFT;  Surgeon: Coletta Memos, MD;  Location: [facility];  Service: Neurosurgery;  Laterality: Left;   MICRODISCECTOMY L5-S1 LEFT   LUMBAR LAMINECTOMY/DECOMPRESSION MICRODISCECTOMY Left 02/13/2016   Procedure: MICRODISCECTOMY LUMBAR FIVE- SACRAL ONE LEFT;  Surgeon: Coletta Memos, MD;  Location: [facility] OR;  Service: Neurosurgery;  Laterality: Left;   TONSILLECTOMY     TOTAL HIP ARTHROPLASTY Right 06/08/2019   Procedure: TOTAL HIP ARTHROPLASTY ANTERIOR APPROACH;  Surgeon: Ollen Gross, MD;  Location: [facility];  Service: Orthopedics;  Laterality: Right;    WOUND DEBRIDEMENT N/A 06/22/2015   Procedure: DEBRIDEMENT WOUND;  Surgeon: Vivi Barrack, DPM;  Location:  SURGERY CENTER;  Service: Podiatry;  Laterality: N/A;    Current Medications: Current Meds  Medication Sig   albuterol (PROVENTIL) 2 MG tablet Take 2 mg by mouth 3 (three) times daily.   aspirin EC 81 MG tablet Take 1 tablet (81 mg total) by mouth daily. Swallow whole.   clopidogrel (PLAVIX) 75 MG tablet TAKE 1 TABLET BY MOUTH EVERY DAY   ezetimibe (ZETIA) 10 MG tablet TAKE 1 TABLET BY MOUTH DAILY   fluticasone (FLONASE) 50 MCG/ACT nasal spray Place 2  sprays into both nostrils as needed for allergies or rhinitis.   levocetirizine (XYZAL) 5 MG tablet Take 5 mg by mouth daily.   meloxicam (MOBIC) 15 MG tablet Take 15 mg by mouth daily.    metoprolol succinate (TOPROL-XL) 25 MG 24 hr tablet TAKE 1 TABLET BY MOUTH EVERY DAY   nitroGLYCERIN (NITROSTAT) 0.4 MG SL tablet Place 1 tablet (0.4 mg total) under the tongue every 5 (five) minutes as needed for chest pain.   pantoprazole (PROTONIX) 40 MG tablet Take 1 tablet (40 mg total) by mouth 2 (two) times daily.   rosuvastatin (CRESTOR) 40 MG tablet TAKE 1/2 TABLET BY MOUTH EVERY DAY   zinc gluconate 50 MG tablet Take 50 mg by mouth daily.     Allergies:   Silicone, Adhesive  [tape], Latex, and Other   Social History   Socioeconomic History   Marital status: Married    Spouse name: Not on file   Number of children: Not on file   Years of education: Not on file   Highest education level: Not on file  Occupational History   Not on file  Tobacco Use   Smoking status: Never   Smokeless tobacco: Former    Types: Associate Professor Use: Never used  Substance and Sexual Activity   Alcohol use: No    Alcohol/week: 1.0 standard drink of alcohol    Types: 1 Glasses of wine per week   Drug use: No   Sexual activity: Not Currently  Other Topics Concern   Not on file  Social History Narrative   Pt lives in [place] with his wife. They have one son who does not live in the area. Pt previously worked for [employer] but is retired. He used to [employer]. He has never smoke and drinks 4-5 alcoholic beverages per week. No illicit drug use.   Social Determinants of Health   Financial Resource Strain: Not on file  Food Insecurity: Not on file  Transportation Needs: Not on file  Physical Activity: Not on file  Stress: Not on file  Social Connections: Not on file     Family History: The patient's family history includes CAD in his cousin; Cancer in his brother; Coronary artery disease in his father. There is no history of Colon cancer, Pancreatic cancer, Rectal cancer, or Stomach cancer.  ROS:   Please see the history of present illness.    All other systems reviewed and are negative.  EKGs/Labs/Other Studies Reviewed:    The following studies were reviewed today: EKG reveals sinus rhythm first-degree AV block anterior and inferior wall myocardial infarction of undetermined age.   Recent Labs: 11/21/2021: Hemoglobin 13.6; TSH 1.670 11/26/2021: Platelets 215 01/15/2022: ALT 18; BUN 9; Creatinine, Ser 0.87; Potassium 4.8; Sodium 139  Recent Lipid Panel    Component Value Date/Time   CHOL 111 01/15/2022 1029   TRIG 69 01/15/2022  1029   HDL 49 01/15/2022 1029   CHOLHDL 2.3 01/15/2022 1029   LDLCALC 47 01/15/2022 1029    Physical Exam:    VS:  BP 126/74   Pulse 67   Ht 6' (1.829 m)   Wt (!) 329 lb 0.6 oz (149.3 kg)   SpO2 94%   BMI 44.63 kg/m     Wt Readings from Last 3 Encounters:  05/20/22 (!) 329 lb 0.6 oz (149.3 kg)  12/27/21 (!) 331 lb 1.3 oz (150.2 kg)  11/26/21 (!) 325 lb (147.4 kg)  GEN: Patient is in no acute distress HEENT: Normal NECK: No JVD; No carotid bruits LYMPHATICS: No lymphadenopathy CARDIAC: Hear sounds regular, 2/6 systolic murmur at the apex. RESPIRATORY:  Clear to auscultation without rales, wheezing or rhonchi  ABDOMEN: Soft, non-tender, non-distended MUSCULOSKELETAL:  No edema; No deformity  SKIN: Warm and dry NEUROLOGIC:  Alert and oriented x 3 PSYCHIATRIC:  Normal affect   Signed, Garwin Brothers, MD  05/20/2022 10:38 AM    [facility]

## 2022-06-03 DIAGNOSIS — D485 Neoplasm of uncertain behavior of skin: Secondary | ICD-10-CM | POA: Diagnosis not present

## 2022-06-03 DIAGNOSIS — L570 Actinic keratosis: Secondary | ICD-10-CM | POA: Diagnosis not present

## 2022-07-15 ENCOUNTER — Other Ambulatory Visit: Payer: Self-pay | Admitting: Cardiology

## 2022-07-15 NOTE — Telephone Encounter (Signed)
Rx refill sent to pharmacy. 

## 2022-07-29 DIAGNOSIS — U099 Post covid-19 condition, unspecified: Secondary | ICD-10-CM | POA: Diagnosis not present

## 2022-07-29 DIAGNOSIS — G709 Myoneural disorder, unspecified: Secondary | ICD-10-CM | POA: Diagnosis not present

## 2022-07-29 DIAGNOSIS — E119 Type 2 diabetes mellitus without complications: Secondary | ICD-10-CM | POA: Diagnosis not present

## 2022-09-10 ENCOUNTER — Ambulatory Visit: Payer: Medicare Other | Attending: Cardiology | Admitting: Cardiology

## 2022-09-10 ENCOUNTER — Encounter: Payer: Self-pay | Admitting: Cardiology

## 2022-09-10 VITALS — BP 128/70 | HR 84 | Ht 72.0 in | Wt 335.1 lb

## 2022-09-10 DIAGNOSIS — I2510 Atherosclerotic heart disease of native coronary artery without angina pectoris: Secondary | ICD-10-CM | POA: Diagnosis not present

## 2022-09-10 DIAGNOSIS — I77810 Thoracic aortic ectasia: Secondary | ICD-10-CM | POA: Diagnosis not present

## 2022-09-10 DIAGNOSIS — I10 Essential (primary) hypertension: Secondary | ICD-10-CM

## 2022-09-10 DIAGNOSIS — E782 Mixed hyperlipidemia: Secondary | ICD-10-CM

## 2022-09-10 NOTE — Patient Instructions (Signed)
Medication Instructions:  Your physician recommends that you continue on your current medications as directed. Please refer to the Current Medication list given to you today.  *If you need a refill on your cardiac medications before your next appointment, please call your pharmacy*   Lab Work: None ordered If you have labs (blood work) drawn today and your tests are completely normal, you will receive your results only by: MyChart Message (if you have MyChart) OR A paper copy in the mail If you have any lab test that is abnormal or we need to change your treatment, we will call you to review the results.   Testing/Procedures: None ordered   Follow-Up: At [facility], you and your health needs are our priority.  As part of our continuing mission to provide you with exceptional heart care, we have created designated Provider Care Teams.  These Care Teams include your primary Cardiologist (physician) and Advanced Practice Providers (APPs -  Physician Assistants and Nurse Practitioners) who all work together to provide you with the care you need, when you need it.  We recommend signing up for the patient portal called "MyChart".  Sign up information is provided on this After Visit Summary.  MyChart is used to connect with patients for Virtual Visits (Telemedicine).  Patients are able to view lab/test results, encounter notes, upcoming appointments, etc.  Non-urgent messages can be sent to your provider as well.   To learn more about what you can do with MyChart, go to [email or web address].    Your next appointment:   2 month(s)  The format for your next appointment:   In Person  Provider:       Other Instructions none  Important Information About Sugar

## 2022-09-10 NOTE — Progress Notes (Signed)
Cardiology Office Note:    Date:  09/10/2022   ID:  Stephen Medina, DOB [birth year], MRN [id number]  PCP:  Assunta Found, MD  Cardiologist:  Garwin Brothers, MD   Referring MD: Assunta Found, MD    ASSESSMENT:    1. Ascending aorta dilatation (HCC)   2. Coronary artery disease involving native coronary artery of native heart without angina pectoris   3. Essential (primary) hypertension   4. Morbid obesity (HCC)   5. Mixed dyslipidemia    PLAN:    In order of problems listed above:  Coronary artery disease: Secondary prevention stressed to the patient.  Importance of compliance with diet medications stressed and he vocalized understanding.  He had an episode of sweating when he was up the ladder trying to fix a ceiling lift.  So he was concerned about this.  With activities of daily living he denies any symptoms.  Medical conservative therapy was discussed and evaluation was discussed and he preferred the former.  He will keep an eye on his symptoms.  If there is anything significant enough to get back to me to get checked or go to the emergency room as necessary. Essential hypertension: Blood pressure stable.  Lifestyle modification urged.  Diet emphasized. Mixed dyslipidemia: On lipid-lowering medications followed by primary care. Morbid obesity: Weight reduction stressed diet emphasized and the patient promises to do better. Patient will be seen in follow-up appointment in 6 months or earlier if the patient has any concerns.    Medication Adjustments/Labs and Tests Ordered: Current medicines are reviewed at length with the patient today.  Concerns regarding medicines are outlined above.  No orders of the defined types were placed in this encounter.  No orders of the defined types were placed in this encounter.    No chief complaint on file.    History of Present Illness:    Stephen Medina is a 72 y.o. male.  Patient has past medical history of ascending aortic  dilatation, coronary artery disease with cath a few months ago, essential hypertension and mixed dyslipidemia and morbid obesity.  He does not ambulate much because of orthopedic issues.  No chest pain orthopnea or PND.  At the time of my evaluation, the patient is alert awake oriented and in no distress.  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/2018   Ascending aorta dilatation (HCC) 06/01/2019   Body mass index (BMI) 40.0-44.9, adult (HCC) 11/29/2019   CAD (coronary artery disease) 08/01/2013   Cath 2005  normal Left main, scattered irregularities LAD, 80% stenosis ostial Diag 1, scattered irregularities CFX, scattered irregularities RCA  Taxus stent March 2005 Diagonal    Disc displacement, lumbar    Disorder of ankle joint 10/20/2018   Displacement of lumbar intervertebral disc without myelopathy 08/23/2015   Essential (primary) hypertension 02/02/2014   Foot ulceration (HCC)    left   GERD (gastroesophageal reflux disease)    History of heart artery stent 05/14/2002   History of right hip replacement 04/14/2019   History of ulcer disease 10/19/2013   HNP (herniated nucleus pulposus), lumbar 09/03/2015   Hyperlipidemia    Hypertension    Inflammation of sacroiliac joint (HCC) 12/24/2017   Laryngopharyngeal reflux 01/21/2022   Lumbar spondylosis 04/01/2013   Lumbosacral spondylosis without myelopathy 01/18/2013   Mixed dyslipidemia 11/27/2017   Morbid obesity (HCC)    Neuromuscular disorder (HCC)    neuropathy left foot    Nonhealing ulcer  of left lower extremity (HCC) 04/16/2015   OA (osteoarthritis) of hip 06/08/2019   Other spondylosis with radiculopathy, lumbar region 11/29/2019   Pain in right knee 05/28/2017   Preop cardiovascular exam 06/01/2019   Primary localized osteoarthrosis of ankle and foot 06/30/2018   Primary osteoarthritis of right hip 06/08/2019   Spinal stenosis of lumbar region 03/17/2013   Spinal  stenosis of lumbar region with neurogenic claudication 01/22/2016   Ulcer of left foot (HCC) 12/04/2014   Ulcer of toe of left foot (HCC) 04/16/2015   Wears glasses     Past Surgical History:  Procedure Laterality Date   APPENDECTOMY     BACK SURGERY     BONE EXOSTOSIS EXCISION Left 01/28/2018   Procedure: Exostectomy left first metatarsal base and medial cuneiform;  Surgeon: Toni Arthurs, MD;  Location: [facility];  Service: Orthopedics;  Laterality: Left;    CARDIAC CATHETERIZATION     05    1 stent   dr Donnie Aho   COLONOSCOPY     CORONARY STENT INTERVENTION N/A 11/26/2021   Procedure: CORONARY STENT INTERVENTION;  Surgeon: Corky Crafts, MD;  Location: [facility] INVASIVE CV LAB;  Service: Cardiovascular;  Laterality: N/A;   CORONARY ULTRASOUND/IVUS N/A 11/26/2021   Procedure: Intravascular Ultrasound/IVUS;  Surgeon: Corky Crafts, MD;  Location: [facility] INVASIVE CV LAB;  Service: Cardiovascular;  Laterality: N/A;   ESOPHAGOGASTRODUODENOSCOPY N/A 08/02/2013   Procedure: ESOPHAGOGASTRODUODENOSCOPY (EGD);  Surgeon: Meryl Dare, MD;  Location: [facility] ENDOSCOPY;  Service: Endoscopy;  Laterality: N/A;   FOOT ARTHRODESIS Left 05/22/2016   Procedure: Left Subtalar and Talonavicular Joint Arthrodesis;  Surgeon: Toni Arthurs, MD;  Location: [facility];  Service: Orthopedics;  Laterality: Left;   FOOT SURGERY     GASTROCNEMIUS RECESSION Left 05/22/2016   Procedure: Left Gastroc Recession;  Surgeon: Toni Arthurs, MD;  Location: [facility];  Service: Orthopedics;  Laterality: Left;   GRAFT APPLICATION N/A 06/22/2015   Procedure: INTEGRA GRAFT APPLICATION;  Surgeon: Vivi Barrack, DPM;  Location: [facility];  Service: Podiatry;  Laterality: N/A;   LEFT HEART CATH AND CORONARY ANGIOGRAPHY N/A 11/26/2021   Procedure: LEFT HEART CATH AND CORONARY ANGIOGRAPHY;  Surgeon: Corky Crafts, MD;  Location[facility] INVASIVE CV LAB;  Service:  Cardiovascular;  Laterality: N/A;   LEFT HEART CATHETERIZATION WITH CORONARY ANGIOGRAM N/A 02/09/2014   Procedure: LEFT HEART CATHETERIZATION WITH CORONARY ANGIOGRAM;  Surgeon: Peter M Swaziland, MD;  Location: [facility] CATH LAB;  Service: Cardiovascular;  Laterality: N/A;   LUMBAR LAMINECTOMY     x2   LUMBAR LAMINECTOMY/DECOMPRESSION MICRODISCECTOMY Left 09/03/2015   Procedure: MICRODISCECTOMY L5-S1 LEFT;  Surgeon: Coletta Memos, MD;  Location: [facility];  Service: Neurosurgery;  Laterality: Left;   MICRODISCECTOMY L5-S1 LEFT   LUMBAR LAMINECTOMY/DECOMPRESSION MICRODISCECTOMY Left 02/13/2016   Procedure: MICRODISCECTOMY LUMBAR FIVE- SACRAL ONE LEFT;  Surgeon: Coletta Memos, MD;  Location: [facility];  Service: Neurosurgery;  Laterality: Left;   TONSILLECTOMY     TOTAL HIP ARTHROPLASTY Right 06/08/2019   Procedure: TOTAL HIP ARTHROPLASTY ANTERIOR APPROACH;  Surgeon: Ollen Gross, MD;  Location: [facility];  Service: Orthopedics;  Laterality: Right;    WOUND DEBRIDEMENT N/A 06/22/2015   Procedure: DEBRIDEMENT WOUND;  Surgeon: Vivi Barrack, DPM;  Location: [facility];  Service: Podiatry;  Laterality: N/A;    Current Medications: Current Meds  Medication Sig   aspirin EC 81 MG tablet Take 1 tablet (81 mg total) by mouth daily.  Swallow whole.   clopidogrel (PLAVIX) 75 MG tablet TAKE 1 TABLET BY MOUTH EVERY DAY   ezetimibe (ZETIA) 10 MG tablet TAKE 1 TABLET BY MOUTH DAILY   levocetirizine (XYZAL) 5 MG tablet Take 5 mg by mouth daily.   meloxicam (MOBIC) 15 MG tablet Take 15 mg by mouth daily.    metoprolol succinate (TOPROL-XL) 25 MG 24 hr tablet TAKE 1 TABLET BY MOUTH EVERY DAY   nitroGLYCERIN (NITROSTAT) 0.4 MG SL tablet Place 1 tablet (0.4 mg total) under the tongue every 5 (five) minutes as needed for chest pain.   pantoprazole (PROTONIX) 40 MG tablet Take 1 tablet (40 mg total) by mouth 2 (two) times daily.   rosuvastatin (CRESTOR) 40 MG tablet Take 0.5 tablets (20 mg total) by mouth  daily.     Allergies:   Silicone, Adhesive [tape], Latex, and Other   Social History   Socioeconomic History   Marital status: Married    Spouse name: Not on file   Number of children: Not on file   Years of education: Not on file   Highest education level: Not on file  Occupational History   Not on file  Tobacco Use   Smoking status: Never   Smokeless tobacco: Former    Types: Designer, multimedia Use   Vaping status: Never Used  Substance and Sexual Activity   Alcohol use: No    Alcohol/week: 1.0 standard drink of alcohol    Types: 1 Glasses of wine per week   Drug use: No   Sexual activity: Not Currently  Other Topics Concern   Not on file  Social History Narrative   Pt lives in [place] with his wife. They have one son who does not live in the area. Pt previously worked for [employer] but is retired. He used to [employer]. He has never smoke and drinks 4-5 alcoholic beverages per week. No illicit drug use.   Social Determinants of Health   Financial Resource Strain: Not on file  Food Insecurity: Not on file  Transportation Needs: Not on file  Physical Activity: Not on file  Stress: Not on file  Social Connections: Not on file     Family History: The patient's family history includes CAD in his cousin; Cancer in his brother; Coronary artery disease in his father. There is no history of Colon cancer, Pancreatic cancer, Rectal cancer, or Stomach cancer.  ROS:   Please see the history of present illness.    All other systems reviewed and are negative.  EKGs/Labs/Other Studies Reviewed:    The following studies were reviewed today: I discussed my findings with the patient at length   Recent Labs: 11/21/2021: Hemoglobin 13.6; TSH 1.670 11/26/2021: Platelets 215 01/15/2022: ALT 18; BUN 9; Creatinine, Ser 0.87; Potassium 4.8; Sodium 139  Recent Lipid Panel    Component Value Date/Time   CHOL 111 01/15/2022 1029   TRIG 69 01/15/2022 1029   HDL 49  01/15/2022 1029   CHOLHDL 2.3 01/15/2022 1029   LDLCALC 47 01/15/2022 1029    Physical Exam:    VS:  BP 128/70   Pulse 84   Ht 6' (1.829 m)   Wt (!) 335 lb 1.3 oz (152 kg)   SpO2 92%   BMI 45.45 kg/m     Wt Readings from Last 3 Encounters:  09/10/22 (!) 335 lb 1.3 oz (152 kg)  05/20/22 (!) 329 lb 0.6 oz (149.3 kg)  12/27/21 (!) 331 lb 1.3 oz (150.2 kg)  GEN: Patient is in no acute distress HEENT: Normal NECK: No JVD; No carotid bruits LYMPHATICS: No lymphadenopathy CARDIAC: Hear sounds regular, 2/6 systolic murmur at the apex. RESPIRATORY:  Clear to auscultation without rales, wheezing or rhonchi  ABDOMEN: Soft, non-tender, non-distended MUSCULOSKELETAL:  No edema; No deformity  SKIN: Warm and dry NEUROLOGIC:  Alert and oriented x 3 PSYCHIATRIC:  Normal affect   Signed, Garwin Brothers, MD  09/10/2022 1:54 PM    [facility]

## 2022-09-24 ENCOUNTER — Encounter (INDEPENDENT_AMBULATORY_CARE_PROVIDER_SITE_OTHER): Payer: Medicare Other | Admitting: Ophthalmology

## 2022-09-24 DIAGNOSIS — D3131 Benign neoplasm of right choroid: Secondary | ICD-10-CM

## 2022-09-24 DIAGNOSIS — H43813 Vitreous degeneration, bilateral: Secondary | ICD-10-CM | POA: Diagnosis not present

## 2022-09-24 DIAGNOSIS — D3132 Benign neoplasm of left choroid: Secondary | ICD-10-CM

## 2022-10-01 DIAGNOSIS — E119 Type 2 diabetes mellitus without complications: Secondary | ICD-10-CM | POA: Diagnosis not present

## 2022-10-01 DIAGNOSIS — M159 Polyosteoarthritis, unspecified: Secondary | ICD-10-CM | POA: Diagnosis not present

## 2022-10-01 DIAGNOSIS — I10 Essential (primary) hypertension: Secondary | ICD-10-CM | POA: Diagnosis not present

## 2022-10-01 DIAGNOSIS — I2510 Atherosclerotic heart disease of native coronary artery without angina pectoris: Secondary | ICD-10-CM | POA: Diagnosis not present

## 2022-10-01 DIAGNOSIS — Z0001 Encounter for general adult medical examination with abnormal findings: Secondary | ICD-10-CM | POA: Diagnosis not present

## 2022-10-10 DIAGNOSIS — H43813 Vitreous degeneration, bilateral: Secondary | ICD-10-CM | POA: Diagnosis not present

## 2022-10-10 DIAGNOSIS — H524 Presbyopia: Secondary | ICD-10-CM | POA: Diagnosis not present

## 2022-11-20 ENCOUNTER — Ambulatory Visit: Payer: Medicare Other | Admitting: Cardiology

## 2022-11-29 ENCOUNTER — Other Ambulatory Visit: Payer: Self-pay | Admitting: Cardiology

## 2022-12-03 DIAGNOSIS — R202 Paresthesia of skin: Secondary | ICD-10-CM | POA: Diagnosis not present

## 2022-12-03 DIAGNOSIS — L570 Actinic keratosis: Secondary | ICD-10-CM | POA: Diagnosis not present

## 2022-12-10 ENCOUNTER — Ambulatory Visit: Payer: Medicare Other | Admitting: Cardiology

## 2022-12-23 ENCOUNTER — Ambulatory Visit: Payer: Medicare Other | Attending: Cardiology | Admitting: Cardiology

## 2022-12-23 ENCOUNTER — Encounter: Payer: Self-pay | Admitting: Cardiology

## 2022-12-23 VITALS — BP 122/62 | HR 74 | Ht 72.0 in | Wt 329.1 lb

## 2022-12-23 DIAGNOSIS — D72829 Elevated white blood cell count, unspecified: Secondary | ICD-10-CM | POA: Diagnosis not present

## 2022-12-23 DIAGNOSIS — I10 Essential (primary) hypertension: Secondary | ICD-10-CM

## 2022-12-23 DIAGNOSIS — I25119 Atherosclerotic heart disease of native coronary artery with unspecified angina pectoris: Secondary | ICD-10-CM | POA: Diagnosis not present

## 2022-12-23 DIAGNOSIS — E782 Mixed hyperlipidemia: Secondary | ICD-10-CM | POA: Diagnosis not present

## 2022-12-23 DIAGNOSIS — I209 Angina pectoris, unspecified: Secondary | ICD-10-CM

## 2022-12-23 DIAGNOSIS — I77810 Thoracic aortic ectasia: Secondary | ICD-10-CM

## 2022-12-23 DIAGNOSIS — I2510 Atherosclerotic heart disease of native coronary artery without angina pectoris: Secondary | ICD-10-CM

## 2022-12-23 NOTE — Progress Notes (Signed)
Cardiology Office Note:    Date:  12/23/2022   ID:  Stephen Medina, DOB 09/05/1950, MRN [id number]  PCP:  Assunta Found, MD  Cardiologist:  Garwin Brothers, MD   Referring MD: Assunta Found, MD    ASSESSMENT:    1. Angina pectoris (HCC)   2. Ascending aorta dilatation (HCC)   3. Coronary artery disease involving native coronary artery of native heart, unspecified whether angina present   4. Essential (primary) hypertension   5. Mixed dyslipidemia   6. Morbid obesity (HCC)    PLAN:    In order of problems listed above:  Angina pectoris: Patient's symptoms are concerning.  Following recommendations were made to the patient.  Sublingual nitroglycerin prescription was sent, its protocol and 911 protocol explained and the patient vocalized understanding questions were answered to the patient's satisfaction.I discussed coronary angiography and left heart catheterization with the patient at extensive length. Procedure, benefits and potential risks were explained. Patient had multiple questions which were answered to the patient's satisfaction. Patient agreed and consented for the procedure. Further recommendations will be made based on the findings of the coronary angiography. In the interim. The patient has any significant symptoms he knows to go to the nearest emergency room.  I also discussed stress testing and noninvasive modalities but is not keen on it. Essential hypertension: Blood pressure is stable and diet was emphasized. Mixed dyslipidemia: On lipid-lowering medications followed by primary care.  Lipids reviewed from [facility] sheet. Morbid obesity: Weight reduction stressed diet emphasized.  Further recommendations will depend on the findings of the coronary angiography.   Medication Adjustments/Labs and Tests Ordered: Current medicines are reviewed at length with the patient today.  Concerns regarding medicines are outlined above.  Orders Placed This Encounter  Procedures   EKG  12-Lead   No orders of the defined types were placed in this encounter.    No chief complaint on file.    History of Present Illness:    Stephen Medina is a 72 y.o. male.  Patient has past medical history of coronary artery disease post stenting, essential hypertension, mixed dyslipidemia and ascending aortic dilatation.  He is morbidly obese and leads a sedentary lifestyle.  He mentions to me that he had 2 episodes of chest tightness.  These episodes were similar to before his stents in the past.  No orthopnea or PND.  He did not use nitroglycerin for unclear reasons.  He is aware of nitroglycerin use.  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/2018   Ascending aorta dilatation (HCC) 06/01/2019   Body mass index (BMI) 40.0-44.9, adult (HCC) 11/29/2019   CAD (coronary artery disease) 08/01/2013   Cath 2005  normal Left main, scattered irregularities LAD, 80% stenosis ostial Diag 1, scattered irregularities CFX, scattered irregularities RCA  Taxus stent March 2005 Diagonal    Disc displacement, lumbar    Disorder of ankle joint 10/20/2018   Displacement of lumbar intervertebral disc without myelopathy 08/23/2015   Essential (primary) hypertension 02/02/2014   Foot ulceration (HCC)    left   GERD (gastroesophageal reflux disease)    History of heart artery stent 05/14/2002   History of right hip replacement 04/14/2019   History of ulcer disease 10/19/2013   HNP (herniated nucleus pulposus), lumbar 09/03/2015   Hyperlipidemia    Hypertension    Inflammation of sacroiliac joint (HCC) 12/24/2017   Laryngopharyngeal reflux 01/21/2022   Lumbar spondylosis 04/01/2013   Lumbosacral spondylosis  without myelopathy 01/18/2013   Mixed dyslipidemia 11/27/2017   Morbid obesity (HCC)    Neuromuscular disorder (HCC)    neuropathy left foot    Nonhealing ulcer of left lower extremity (HCC) 04/16/2015   OA (osteoarthritis) of hip  06/08/2019   Other spondylosis with radiculopathy, lumbar region 11/29/2019   Pain in right knee 05/28/2017   Preop cardiovascular exam 06/01/2019   Primary localized osteoarthrosis of ankle and foot 06/30/2018   Primary osteoarthritis of right hip 06/08/2019   Spinal stenosis of lumbar region 03/17/2013   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   Ulcer of left foot (HCC) 12/04/2014   Ulcer of toe of left foot (HCC) 04/16/2015   Wears glasses     Past Surgical History:  Procedure Laterality Date   APPENDECTOMY     BACK SURGERY     BONE EXOSTOSIS EXCISION Left 01/28/2018   Procedure: Exostectomy left first metatarsal base and medial cuneiform;  Surgeon: Toni Arthurs, MD;  Location: [facility];  Service: Orthopedics;  Laterality: Left;    CARDIAC CATHETERIZATION     05    1 stent   dr Donnie Aho   COLONOSCOPY     CORONARY STENT INTERVENTION N/A 11/26/2021   Procedure: CORONARY STENT INTERVENTION;  Surgeon: Corky Crafts, MD;  Location: [facility] INVASIVE CV LAB;  Service: Cardiovascular;  Laterality: N/A;   CORONARY ULTRASOUND/IVUS N/A 11/26/2021   Procedure: Intravascular Ultrasound/IVUS;  Surgeon: Corky Crafts, MD;  Location[facility] INVASIVE CV LAB;  Service: Cardiovascular;  Laterality: N/A;   ESOPHAGOGASTRODUODENOSCOPY N/A 08/02/2013   Procedure: ESOPHAGOGASTRODUODENOSCOPY (EGD);  Surgeon: Meryl Dare, MD;  Location: [facility] ENDOSCOPY;  Service: Endoscopy;  Laterality: N/A;   FOOT ARTHRODESIS Left 05/22/2016   Procedure: Left Subtalar and Talonavicular Joint Arthrodesis;  Surgeon: Toni Arthurs, MD;  Location: [facility];  Service: Orthopedics;  Laterality: Left;   FOOT SURGERY     GASTROCNEMIUS RECESSION Left 05/22/2016   Procedure: Left Gastroc Recession;  Surgeon: Toni Arthurs, MD;  Location: [facility];  Service: Orthopedics;  Laterality: Left;   GRAFT APPLICATION N/A 06/22/2015   Procedure: [employer]  GRAFT APPLICATION;  Surgeon: Vivi Barrack, DPM;  Location: [facility];  Service: Podiatry;  Laterality: N/A;   LEFT HEART CATH AND CORONARY ANGIOGRAPHY N/A 11/26/2021   Procedure: LEFT HEART CATH AND CORONARY ANGIOGRAPHY;  Surgeon: Corky Crafts, MD;  Location: [facility] INVASIVE CV LAB;  Service: Cardiovascular;  Laterality: N/A;   LEFT HEART CATHETERIZATION WITH CORONARY ANGIOGRAM N/A 02/09/2014   Procedure: LEFT HEART CATHETERIZATION WITH CORONARY ANGIOGRAM;  Surgeon: Peter M Swaziland, MD;  Location: [facility] CATH LAB;  Service: Cardiovascular;  Laterality: N/A;   LUMBAR LAMINECTOMY     x2   LUMBAR LAMINECTOMY/DECOMPRESSION MICRODISCECTOMY Left 09/03/2015   Procedure: MICRODISCECTOMY L5-S1 LEFT;  Surgeon: Coletta Memos, MD;  Location: [facility]O [facility]S;  Service: Neurosurgery;  Laterality: Left;   MICRODISCECTOMY L5-S1 LEFT   LUMBAR LAMINECTOMY/DECOMPRESSION MICRODISCECTOMY Left 02/13/2016   Procedure: MICRODISCECTOMY LUMBAR FIVE- SACRAL ONE LEFT;  Surgeon: Coletta Memos, MD;  Location: [facility] OR;  Service: Neurosurgery;  Laterality: Left;   TONSILLECTOMY     TOTAL HIP ARTHROPLASTY Right 06/08/2019   Procedure: TOTAL HIP ARTHROPLASTY ANTERIOR APPROACH;  Surgeon: Ollen Gross, MD;  Location: [facility];  Service: Orthopedics;  Laterality: Right;    WOUND DEBRIDEMENT N/A 06/22/2015   Procedure: DEBRIDEMENT WOUND;  Surgeon: Vivi Barrack, DPM;  Location: [facility];  Service:  Podiatry;  Laterality: N/A;    Current Medications: No outpatient medications have been marked as taking for the 12/23/22 encounter (Office Visit) with Taressa Rauh, Aundra Dubin, MD.     Allergies:   Silicone, Adhesive [tape], Latex, and Other   Social History   Socioeconomic History   Marital status: Married    Spouse name: Not on file   Number of children: Not on file   Years of education: Not on file   Highest education level: Not on file  Occupational History   Not on file  Tobacco Use   Smoking  status: Never   Smokeless tobacco: Former    Types: Designer, multimedia Use   Vaping status: Never Used  Substance and Sexual Activity   Alcohol use: No    Alcohol/week: 1.0 standard drink of alcohol    Types: 1 Glasses of wine per week   Drug use: No   Sexual activity: Not Currently  Other Topics Concern   Not on file  Social History Narrative   Pt lives in [place] with his wife. They have one son who does not live in the area. Pt previously worked for [employer] but is retired. He used to [employer]. He has never smoke and drinks 4-5 alcoholic beverages per week. No illicit drug use.   Social Determinants of Health   Financial Resource Strain: Not on file  Food Insecurity: Not on file  Transportation Needs: Not on file  Physical Activity: Not on file  Stress: Not on file  Social Connections: Not on file     Family History: The patient's family history includes CAD in his cousin; Cancer in his brother; Coronary artery disease in his father. There is no history of Colon cancer, Pancreatic cancer, Rectal cancer, or Stomach cancer.  ROS:   Please see the history of present illness.    All other systems reviewed and are negative.  EKGs/Labs/Other Studies Reviewed:    The following studies were reviewed today: .Marland KitchenEKG Interpretation Date/Time:  Tuesday December 23 2022 14:48:55 EST Ventricular Rate:  74 PR Interval:  262 QRS Duration:  100 QT Interval:  390 QTC Calculation: 432 R Axis:   9  Text Interpretation: Sinus rhythm with 1st degree A-V block Incomplete right bundle branch block Inferior infarct (cited on or before 20-Jun-2015) Possible Anterior infarct (cited on or before 09-Feb-2014) When compared with ECG of 26-Nov-2021 13:12, Incomplete right bundle branch block is now Present Questionable change in initial forces of Anterior leads Confirmed by Belva Crome [phone]) on 12/23/2022 2:51:23 PM     Recent Labs: 01/15/2022: ALT 18; BUN 9; Creatinine, Ser  0.87; Potassium 4.8; Sodium 139  Recent Lipid Panel    Component Value Date/Time   CHOL 111 01/15/2022 1029   TRIG 69 01/15/2022 1029   HDL 49 01/15/2022 1029   CHOLHDL 2.3 01/15/2022 1029   LDLCALC 47 01/15/2022 1029    Physical Exam:    VS:  BP 122/62   Pulse 74   Ht 6' (1.829 m)   Wt (!) 329 lb 1.3 oz (149.3 kg)   SpO2 95%   BMI 44.63 kg/m     Wt Readings from Last 3 Encounters:  12/23/22 (!) 329 lb 1.3 oz (149.3 kg)  09/10/22 (!) 335 lb 1.3 oz (152 kg)  05/20/22 (!) 329 lb 0.6 oz (149.3 kg)     GEN: Patient is in no acute distress HEENT: Normal NECK: No JVD; No carotid bruits LYMPHATICS: No lymphadenopathy CARDIAC: Hear sounds regular,  2/6 systolic murmur at the apex. RESPIRATORY:  Clear to auscultation without rales, wheezing or rhonchi  ABDOMEN: Soft, non-tender, non-distended MUSCULOSKELETAL:  No edema; No deformity  SKIN: Warm and dry NEUROLOGIC:  Alert and oriented x 3 PSYCHIATRIC:  Normal affect   Signed, Garwin Brothers, MD  12/23/2022 2:47 PM    [facility]

## 2022-12-23 NOTE — Patient Instructions (Signed)
Medication Instructions:  Your physician has recommended you make the following change in your medication:   Take 81 mg coated aspirin daily. Use nitroglycerin as needed for chest pain.  *If you need a refill on your cardiac medications before your next appointment, please call your pharmacy*   Lab Work: Your physician recommends that you have a BMET and CBC today in the office for your upcoming procedure.  If you have labs (blood work) drawn today and your tests are completely normal, you will receive your results only by: MyChart Message (if you have MyChart) OR A paper copy in the mail If you have any lab test that is abnormal or we need to change your treatment, we will call you to review the results.   Testing/Procedures:  [place] A DEPT OF [facility] AT [facility] [place] [address] [place], [place] [address] [place] [address] Dept: [phone] Loc: [phone]  Stephen Medina  12/23/2022  You are scheduled for a Cardiac Catheterization on Friday, December 13 with Dr. Rosemary Holms.  1. Please arrive at the [facility] (Main Entrance A) at [facility]: [address] [place], [place] [address] at 7:00 AM (This time is 2 hour(s) before your procedure to ensure your preparation).   Free valet parking service is available. You will check in at ADMITTING. The support person will be asked to wait in the waiting room.  It is OK to have someone drop you off and come back when you are ready to be discharged.    Special note: Every effort is made to have your procedure done on time. Please understand that emergencies sometimes delay scheduled procedures.  2. Diet: Do not eat solid foods after midnight.  The patient may have clear liquids until 5am upon the day of the procedure.  3. Labs: You had labs done today in the office  4. Medication instructions in preparation for your procedure:   Contrast Allergy:  No   Stop taking, Mobic (Meloxicam) Friday, December 13,  On the morning of your procedure, take your Aspirin 81 mg and Plavix/Clopidogrel and any morning medicines NOT listed above.  You may use sips of water.  5. Plan to go home the same day, you will only stay overnight if medically necessary. 6. Bring a current list of your medications and current insurance cards. 7. You MUST have a responsible person to drive you home. 8. Someone MUST be with you the first 24 hours after you arrive home or your discharge will be delayed. 9. Please wear clothes that are easy to get on and off and wear slip-on shoes.  Thank you for allowing [place] to care for you!   -- [facility]    Follow-Up: At [facility], you and your health needs are our priority.  As part of our continuing mission to provide you with exceptional heart care, we have created designated Provider Care Teams.  These Care Teams include your primary Cardiologist (physician) and Advanced Practice Providers (APPs -  Physician Assistants and Nurse Practitioners) who all work together to provide you with the care you need, when you need it.  We recommend signing up for the patient portal called "MyChart".  Sign up information is provided on this After Visit Summary.  MyChart is used to connect with patients for Virtual Visits (Telemedicine).  Patients are able to view lab/test results, encounter notes, upcoming appointments, etc.  Non-urgent messages can be  sent to your provider as well.   To learn more about what you can do with MyChart, go to [email or web address].    Your next appointment:   6 month(s)  The format for your next appointment:   In Person  Provider:   Belva Crome, MD   Other Instructions  Coronary Angiogram With Stent Coronary angiogram with stent placement is a procedure to widen or open a narrow blood vessel of the heart (coronary artery). Arteries may become blocked by  cholesterol buildup (plaques) in the lining of the artery wall. When a coronary artery becomes partially blocked, blood flow to that area decreases. This may lead to chest pain or a heart attack (myocardial infarction). A stent is a small piece of metal that looks like mesh or spring. Stent placement may be done as treatment after a heart attack, or to prevent a heart attack if a blocked artery is found by a coronary angiogram. Let your health care provider know about: Any allergies you have, including allergies to medicines or contrast dye. All medicines you are taking, including vitamins, herbs, eye drops, creams, and over-the-counter medicines. Any problems you or family members have had with anesthetic medicines. Any blood disorders you have. Any surgeries you have had. Any medical conditions you have, including kidney problems or kidney failure. Whether you are pregnant or may be pregnant. Whether you are breastfeeding. What are the risks? Generally, this is a safe procedure. However, serious problems may occur, including: Damage to nearby structures or organs, such as the heart, blood vessels, or kidneys. A return of blockage. Bleeding, infection, or bruising at the insertion site. A collection of blood under the skin (hematoma) at the insertion site. A blood clot in another part of the body. Allergic reaction to medicines or dyes. Bleeding into the abdomen (retroperitoneal bleeding). Stroke (rare). Heart attack (rare). What happens before the procedure? Staying hydrated Follow instructions from your health care provider about hydration, which may include: Up to 2 hours before the procedure - you may continue to drink clear liquids, such as water, clear fruit juice, black coffee, and plain tea.    Eating and drinking restrictions Follow instructions from your health care provider about eating and drinking, which may include: 8 hours before the procedure - stop eating heavy meals or  foods, such as meat, fried foods, or fatty foods. 6 hours before the procedure - stop eating light meals or foods, such as toast or cereal. 2 hours before the procedure - stop drinking clear liquids. Medicines Ask your health care provider about: Changing or stopping your regular medicines. This is especially important if you are taking diabetes medicines or blood thinners. Taking medicines such as aspirin and ibuprofen. These medicines can thin your blood. Do not take these medicines unless your health care provider tells you to take them. Generally, aspirin is recommended before a thin tube, called a catheter, is passed through a blood vessel and inserted into the heart (cardiac catheterization). Taking over-the-counter medicines, vitamins, herbs, and supplements. General instructions Do not use any products that contain nicotine or tobacco for at least 4 weeks before the procedure. These products include cigarettes, e-cigarettes, and chewing tobacco. If you need help quitting, ask your health care provider. Plan to have someone take you home from the hospital or clinic. If you will be going home right after the procedure, plan to have someone with you for 24 hours. You may have tests and imaging procedures. Ask your health care provider: How your insertion  site will be marked. Ask which artery will be used for the procedure. What steps will be taken to help prevent infection. These may include: Removing hair at the insertion site. Washing skin with a germ-killing soap. Taking antibiotic medicine. What happens during the procedure? An IV will be inserted into one of your veins. Electrodes may be placed on your chest to monitor your heart rate during the procedure. You will be given one or more of the following: A medicine to help you relax (sedative). A medicine to numb the area (local anesthetic) for catheter insertion. A small incision will be made for catheter insertion. The catheter  will be inserted into an artery using a guide wire. The location may be in your groin, your wrist, or the fold of your arm (near your elbow). An X-ray procedure (fluoroscopy) will be used to help guide the catheter to the opening of the heart arteries. A dye will be injected into the catheter. X-rays will be taken. The dye helps to show where any narrowing or blockages are located in the arteries. Tell your health care provider if you have chest pain or trouble breathing. A tiny wire will be guided to the blocked spot, and a balloon will be inflated to make the artery wider. The stent will be expanded to crush the plaques into the wall of the vessel. The stent will hold the area open and improve the blood flow. Most stents have a drug coating to reduce the risk of the stent narrowing over time. The artery may be made wider using a drill, laser, or other tools that remove plaques. The catheter will be removed when the blood flow improves. The stent will stay where it was placed, and the lining of the artery will grow over it. A bandage (dressing) will be placed on the insertion site. Pressure will be applied to stop bleeding. The IV will be removed. This procedure may vary among health care providers and hospitals.    What happens after the procedure? Your blood pressure, heart rate, breathing rate, and blood oxygen level will be monitored until you leave the hospital or clinic. If the procedure is done through the leg, you will lie flat in bed for a few hours or for as long as told by your health care provider. You will be instructed not to bend or cross your legs. The insertion site and the pulse in your foot or wrist will be checked often. You may have more blood tests, X-rays, and a test that records the electrical activity of your heart (electrocardiogram, or ECG). Do not drive for 24 hours if you were given a sedative during your procedure. Summary Coronary angiogram with stent placement is a  procedure to widen or open a narrowed coronary artery. This is done to treat heart problems. Before the procedure, let your health care provider know about all the medical conditions and surgeries you have or have had. This is a safe procedure. However, some problems may occur, including damage to nearby structures or organs, bleeding, blood clots, or allergies. Follow your health care provider's instructions about eating, drinking, medicines, and other lifestyle changes, such as quitting tobacco use before the procedure. This information is not intended to replace advice given to you by your health care provider. Make sure you discuss any questions you have with your health care provider. Document Revised: 07/21/2018 Document Reviewed: 07/21/2018 Elsevier Patient Education  2021 Elsevier Inc.  Aspirin and Your Heart Aspirin is a medicine that prevents  the platelets in your blood from sticking together. Platelets are the cells that your blood uses for clotting. Aspirin can be used to help reduce the risk of blood clots, heart attacks, and other heart-related problems. What are the risks? Daily use of aspirin can cause side effects. Some of these include: Bleeding. Bleeding can be minor or serious. An example of minor bleeding is bleeding from a cut, and the bleeding does not stop. An example of more serious bleeding is stomach bleeding or, rarely, bleeding into the brain. Your risk of bleeding increases if you are also taking NSAIDs, such as ibuprofen. Increased bruising. Upset stomach. An allergic reaction. People who have growths inside the nose (nasal polyps) have an increased risk of developing an aspirin allergy. How to use aspirin to care for your heart Take aspirin only as told by your health care provider. Make sure that you understand how much to take and what form to take. The two forms of aspirin are: Non-enteric-coated.This type of aspirin does not have a coating and is absorbed  quickly. This type of aspirin also comes in a chewable form. Enteric-coated. This type of aspirin has a coating that releases the medicine very slowly. Enteric-coated aspirin might cause less stomach upset than non-enteric-coated aspirin. This type of aspirin should not be chewed or crushed. Work with your health care provider to find out whether it is safe and beneficial for you to take aspirin daily. Taking aspirin daily may be helpful if: You have had a heart attack or chest pain, or you are at risk for a heart attack. You have a condition in which certain heart vessels are blocked (coronary artery disease), and you have had a procedure to treat it. Examples are: Open-heart surgery, such as coronary artery bypass surgery (CABG). Coronary angioplasty,which is done to widen a blood vessel of your heart. Having a small mesh tube, or stent, placed in your coronary artery. You have had certain types of stroke or a mini-stroke known as a transient ischemic attack (TIA). You have a narrowing of the arteries that supply the limbs (peripheral artery disease, or PAD). You have long-term (chronic) heart rhythm problems, such as atrial fibrillation, and your health care provider thinks aspirin may help. You have valve disease or have had surgery on a valve. You are considered at increased risk of developing coronary artery disease or PAD.    Follow these instructions at home Medicines Take over-the-counter and prescription medicines only as told by your health care provider. If you are taking blood thinners: Talk with your health care provider before you take any medicines that contain aspirin or NSAIDs, such as ibuprofen. These medicines increase your risk for dangerous bleeding. Take your medicine exactly as told, at the same time every day. Avoid activities that could cause injury or bruising, and follow instructions about how to prevent falls. Wear a medical alert bracelet or carry a card that lists  what medicines you take. General instructions Do not drink alcohol if: Your health care provider tells you not to drink. You are pregnant, may be pregnant, or are planning to become pregnant. If you drink alcohol: Limit how much you use to: 0-1 drink a day for women. 0-2 drinks a day for men. Be aware of how much alcohol is in your drink. In the [place] one drink equals one 12 oz bottle of beer (355 mL), one 5 oz glass of wine (148 mL), or one 1 oz glass of hard liquor (44 mL). Keep all  follow-up visits as told by your health care provider. This is important. Where to find more information The American Heart Association: www.heart.org Contact a health care provider if you have: Unusual bleeding or bruising. Stomach pain or nausea. Ringing in your ears. An allergic reaction that causes hives, itchy skin, or swelling of the lips, tongue, or face. Get help right away if: You notice that your bowel movements are bloody, or dark red or black in color. You vomit or cough up blood. You have blood in your urine. You cough, breathe loudly (wheeze), or feel short of breath. You have chest pain, especially if the pain spreads to your arms, back, neck, or jaw. You have a headache with confusion. You have any symptoms of a stroke. "BE FAST" is an easy way to remember the main warning signs of a stroke: B - Balance. Signs are dizziness, sudden trouble walking, or loss of balance. E - Eyes. Signs are trouble seeing or a sudden change in vision. F - Face. Signs are sudden weakness or numbness of the face, or the face or eyelid drooping on one side. A - Arms. Signs are weakness or numbness in an arm. This happens suddenly and usually on one side of the body. S - Speech. Signs are sudden trouble speaking, slurred speech, or trouble understanding what people say. T - Time. Time to call emergency services. Write down what time symptoms started. You have other signs of a stroke, such as: A sudden, severe  headache with no known cause. Nausea or vomiting. Seizure. These symptoms may represent a serious problem that is an emergency. Do not wait to see if the symptoms will go away. Get medical help right away. Call your local emergency services (911 in the [place] Do not drive yourself to the hospital. Summary Aspirin use can help reduce the risk of blood clots, heart attacks, and other heart-related problems. Daily use of aspirin can cause side effects. Take aspirin only as told by your health care provider. Make sure that you understand how much to take and what form to take. Your health care provider will help you determine whether it is safe and beneficial for you to take aspirin daily. This information is not intended to replace advice given to you by your health care provider. Make sure you discuss any questions you have with your health care provider. Document Revised: 10/04/2018 Document Reviewed: 10/04/2018 Elsevier Patient Education  2021 Elsevier Inc. Nitroglycerin sublingual tablets What is this medicine? NITROGLYCERIN (nye troe GLI ser in) is a type of vasodilator. It relaxes blood vessels, increasing the blood and oxygen supply to your heart. This medicine is used to relieve chest pain caused by angina. It is also used to prevent chest pain before activities like climbing stairs, going outdoors in cold weather, or sexual activity. This medicine may be used for other purposes; ask your health care provider or pharmacist if you have questions. COMMON BRAND NAME(S): Nitroquick, Nitrostat, Nitrotab What should I tell my health care provider before I take this medicine? They need to know if you have any of these conditions: anemia head injury, recent stroke, or bleeding in the brain liver disease previous heart attack an unusual or allergic reaction to nitroglycerin, other medicines, foods, dyes, or preservatives pregnant or trying to get pregnant breast-feeding How should I use this  medicine? Take this medicine by mouth as needed. Use at the first sign of an angina attack (chest pain or tightness). You can also take this medicine 5  to 10 minutes before an event likely to produce chest pain. Follow the directions exactly as written on the prescription label. Place one tablet under your tongue and let it dissolve. Do not swallow whole. Replace the dose if you accidentally swallow it. It will help if your mouth is not dry. Saliva around the tablet will help it to dissolve more quickly. Do not eat or drink, smoke or chew tobacco while a tablet is dissolving. Sit down when taking this medicine. In an angina attack, you should feel better within 5 minutes after your first dose. You can take a dose every 5 minutes up to a total of 3 doses. If you do not feel better or feel worse after 1 dose, call 9-1-1 at once. Do not take more than 3 doses in 15 minutes. Your health care provider might give you other directions. Follow those directions if he or she does. Do not take your medicine more often than directed. Talk to your health care provider about the use of this medicine in children. Special care may be needed. Overdosage: If you think you have taken too much of this medicine contact a poison control center or emergency room at once. NOTE: This medicine is only for you. Do not share this medicine with others. What if I miss a dose? This does not apply. This medicine is only used as needed. What may interact with this medicine? Do not take this medicine with any of the following medications: certain migraine medicines like ergotamine and dihydroergotamine (DHE) medicines used to treat erectile dysfunction like sildenafil, tadalafil, and vardenafil riociguat This medicine may also interact with the following medications: alteplase aspirin heparin medicines for high blood pressure medicines for mental depression other medicines used to treat angina phenothiazines like chlorpromazine,  mesoridazine, prochlorperazine, thioridazine This list may not describe all possible interactions. Give your health care provider a list of all the medicines, herbs, non-prescription drugs, or dietary supplements you use. Also tell them if you smoke, drink alcohol, or use illegal drugs. Some items may interact with your medicine. What should I watch for while using this medicine? Tell your doctor or health care professional if you feel your medicine is no longer working. Keep this medicine with you at all times. Sit or lie down when you take your medicine to prevent falling if you feel dizzy or faint after using it. Try to remain calm. This will help you to feel better faster. If you feel dizzy, take several deep breaths and lie down with your feet propped up, or bend forward with your head resting between your knees. You may get drowsy or dizzy. Do not drive, use machinery, or do anything that needs mental alertness until you know how this drug affects you. Do not stand or sit up quickly, especially if you are an older patient. This reduces the risk of dizzy or fainting spells. Alcohol can make you more drowsy and dizzy. Avoid alcoholic drinks. Do not treat yourself for coughs, colds, or pain while you are taking this medicine without asking your doctor or health care professional for advice. Some ingredients may increase your blood pressure. What side effects may I notice from receiving this medicine? Side effects that you should report to your doctor or health care professional as soon as possible: allergic reactions (skin rash, itching or hives; swelling of the face, lips, or tongue) low blood pressure (dizziness; feeling faint or lightheaded, falls; unusually weak or tired) low red blood cell counts (trouble breathing; feeling  faint; lightheaded, falls; unusually weak or tired) Side effects that usually do not require medical attention (report to your doctor or health care professional if they  continue or are bothersome): facial flushing (redness) headache nausea, vomiting This list may not describe all possible side effects. Call your doctor for medical advice about side effects. You may report side effects to FDA at 1-800-FDA-1088. Where should I keep my medicine? Keep out of the reach of children. Store at room temperature between 20 and 25 degrees C (68 and 77 degrees F). Store in Retail buyer. Protect from light and moisture. Keep tightly closed. Throw away any unused medicine after the expiration date. NOTE: This sheet is a summary. It may not cover all possible information. If you have questions about this medicine, talk to your doctor, pharmacist, or health care provider.  2021 Elsevier/Gold Standard (2017-09-30 16:46:32)

## 2022-12-23 NOTE — H&P (View-Only) (Signed)
 Cardiology Office Note:    Date:  12/23/2022   ID:  Stephen Medina, DOB 09/05/1950, MRN [id number]  PCP:  Assunta Found, MD  Cardiologist:  Garwin Brothers, MD   Referring MD: Assunta Found, MD    ASSESSMENT:    1. Angina pectoris (HCC)   2. Ascending aorta dilatation (HCC)   3. Coronary artery disease involving native coronary artery of native heart, unspecified whether angina present   4. Essential (primary) hypertension   5. Mixed dyslipidemia   6. Morbid obesity (HCC)    PLAN:    In order of problems listed above:  Angina pectoris: Patient's symptoms are concerning.  Following recommendations were made to the patient.  Sublingual nitroglycerin prescription was sent, its protocol and 911 protocol explained and the patient vocalized understanding questions were answered to the patient's satisfaction.I discussed coronary angiography and left heart catheterization with the patient at extensive length. Procedure, benefits and potential risks were explained. Patient had multiple questions which were answered to the patient's satisfaction. Patient agreed and consented for the procedure. Further recommendations will be made based on the findings of the coronary angiography. In the interim. The patient has any significant symptoms he knows to go to the nearest emergency room.  I also discussed stress testing and noninvasive modalities but is not keen on it. Essential hypertension: Blood pressure is stable and diet was emphasized. Mixed dyslipidemia: On lipid-lowering medications followed by primary care.  Lipids reviewed from [facility] sheet. Morbid obesity: Weight reduction stressed diet emphasized.  Further recommendations will depend on the findings of the coronary angiography.   Medication Adjustments/Labs and Tests Ordered: Current medicines are reviewed at length with the patient today.  Concerns regarding medicines are outlined above.  Orders Placed This Encounter  Procedures   EKG  12-Lead   No orders of the defined types were placed in this encounter.    No chief complaint on file.    History of Present Illness:    Stephen Medina is a 72 y.o. male.  Patient has past medical history of coronary artery disease post stenting, essential hypertension, mixed dyslipidemia and ascending aortic dilatation.  He is morbidly obese and leads a sedentary lifestyle.  He mentions to me that he had 2 episodes of chest tightness.  These episodes were similar to before his stents in the past.  No orthopnea or PND.  He did not use nitroglycerin for unclear reasons.  He is aware of nitroglycerin use.  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/2018   Ascending aorta dilatation (HCC) 06/01/2019   Body mass index (BMI) 40.0-44.9, adult (HCC) 11/29/2019   CAD (coronary artery disease) 08/01/2013   Cath 2005  normal Left main, scattered irregularities LAD, 80% stenosis ostial Diag 1, scattered irregularities CFX, scattered irregularities RCA  Taxus stent March 2005 Diagonal    Disc displacement, lumbar    Disorder of ankle joint 10/20/2018   Displacement of lumbar intervertebral disc without myelopathy 08/23/2015   Essential (primary) hypertension 02/02/2014   Foot ulceration (HCC)    left   GERD (gastroesophageal reflux disease)    History of heart artery stent 05/14/2002   History of right hip replacement 04/14/2019   History of ulcer disease 10/19/2013   HNP (herniated nucleus pulposus), lumbar 09/03/2015   Hyperlipidemia    Hypertension    Inflammation of sacroiliac joint (HCC) 12/24/2017   Laryngopharyngeal reflux 01/21/2022   Lumbar spondylosis 04/01/2013   Lumbosacral spondylosis  without myelopathy 01/18/2013   Mixed dyslipidemia 11/27/2017   Morbid obesity (HCC)    Neuromuscular disorder (HCC)    neuropathy left foot    Nonhealing ulcer of left lower extremity (HCC) 04/16/2015   OA (osteoarthritis) of hip  06/08/2019   Other spondylosis with radiculopathy, lumbar region 11/29/2019   Pain in right knee 05/28/2017   Preop cardiovascular exam 06/01/2019   Primary localized osteoarthrosis of ankle and foot 06/30/2018   Primary osteoarthritis of right hip 06/08/2019   Spinal stenosis of lumbar region 03/17/2013   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   Ulcer of left foot (HCC) 12/04/2014   Ulcer of toe of left foot (HCC) 04/16/2015   Wears glasses     Past Surgical History:  Procedure Laterality Date   APPENDECTOMY     BACK SURGERY     BONE EXOSTOSIS EXCISION Left 01/28/2018   Procedure: Exostectomy left first metatarsal base and medial cuneiform;  Surgeon: Toni Arthurs, MD;  Location: [facility];  Service: Orthopedics;  Laterality: Left;    CARDIAC CATHETERIZATION     05    1 stent   dr Donnie Aho   COLONOSCOPY     CORONARY STENT INTERVENTION N/A 11/26/2021   Procedure: CORONARY STENT INTERVENTION;  Surgeon: Corky Crafts, MD;  Location: [facility] INVASIVE CV LAB;  Service: Cardiovascular;  Laterality: N/A;   CORONARY ULTRASOUND/IVUS N/A 11/26/2021   Procedure: Intravascular Ultrasound/IVUS;  Surgeon: Corky Crafts, MD;  Location[facility] INVASIVE CV LAB;  Service: Cardiovascular;  Laterality: N/A;   ESOPHAGOGASTRODUODENOSCOPY N/A 08/02/2013   Procedure: ESOPHAGOGASTRODUODENOSCOPY (EGD);  Surgeon: Meryl Dare, MD;  Location: [facility] ENDOSCOPY;  Service: Endoscopy;  Laterality: N/A;   FOOT ARTHRODESIS Left 05/22/2016   Procedure: Left Subtalar and Talonavicular Joint Arthrodesis;  Surgeon: Toni Arthurs, MD;  Location: [facility];  Service: Orthopedics;  Laterality: Left;   FOOT SURGERY     GASTROCNEMIUS RECESSION Left 05/22/2016   Procedure: Left Gastroc Recession;  Surgeon: Toni Arthurs, MD;  Location: [facility];  Service: Orthopedics;  Laterality: Left;   GRAFT APPLICATION N/A 06/22/2015   Procedure: [employer]  GRAFT APPLICATION;  Surgeon: Vivi Barrack, DPM;  Location: [facility];  Service: Podiatry;  Laterality: N/A;   LEFT HEART CATH AND CORONARY ANGIOGRAPHY N/A 11/26/2021   Procedure: LEFT HEART CATH AND CORONARY ANGIOGRAPHY;  Surgeon: Corky Crafts, MD;  Location: [facility] INVASIVE CV LAB;  Service: Cardiovascular;  Laterality: N/A;   LEFT HEART CATHETERIZATION WITH CORONARY ANGIOGRAM N/A 02/09/2014   Procedure: LEFT HEART CATHETERIZATION WITH CORONARY ANGIOGRAM;  Surgeon: Peter M Swaziland, MD;  Location: [facility] CATH LAB;  Service: Cardiovascular;  Laterality: N/A;   LUMBAR LAMINECTOMY     x2   LUMBAR LAMINECTOMY/DECOMPRESSION MICRODISCECTOMY Left 09/03/2015   Procedure: MICRODISCECTOMY L5-S1 LEFT;  Surgeon: Coletta Memos, MD;  Location: [facility] NEURO ORS;  Service: Neurosurgery;  Laterality: Left;   MICRODISCECTOMY L5-S1 LEFT   LUMBAR LAMINECTOMY/DECOMPRESSION MICRODISCECTOMY Left 02/13/2016   Procedure: MICRODISCECTOMY LUMBAR FIVE- SACRAL ONE LEFT;  Surgeon: Coletta Memos, MD;  Location: [facility] OR;  Service: Neurosurgery;  Laterality: Left;   TONSILLECTOMY     TOTAL HIP ARTHROPLASTY Right 06/08/2019   Procedure: TOTAL HIP ARTHROPLASTY ANTERIOR APPROACH;  Surgeon: Ollen Gross, MD;  Location: [facility] ORS;  Service: Orthopedics;  Laterality: Right;    WOUND DEBRIDEMENT N/A 06/22/2015   Procedure: DEBRIDEMENT WOUND;  Surgeon: Vivi Barrack, DPM;  Location: [facility];  Service:  Podiatry;  Laterality: N/A;    Current Medications: No outpatient medications have been marked as taking for the 12/23/22 encounter (Office Visit) with Taressa Rauh, Aundra Dubin, MD.     Allergies:   Silicone, Adhesive [tape], Latex, and Other   Social History   Socioeconomic History   Marital status: Married    Spouse name: Not on file   Number of children: Not on file   Years of education: Not on file   Highest education level: Not on file  Occupational History   Not on file  Tobacco Use   Smoking  status: Never   Smokeless tobacco: Former    Types: Designer, multimedia Use   Vaping status: Never Used  Substance and Sexual Activity   Alcohol use: No    Alcohol/week: 1.0 standard drink of alcohol    Types: 1 Glasses of wine per week   Drug use: No   Sexual activity: Not Currently  Other Topics Concern   Not on file  Social History Narrative   Pt lives in [place] with his wife. They have one son who does not live in the area. Pt previously worked for [employer] but is retired. He used to [employer]. He has never smoke and drinks 4-5 alcoholic beverages per week. No illicit drug use.   Social Determinants of Health   Financial Resource Strain: Not on file  Food Insecurity: Not on file  Transportation Needs: Not on file  Physical Activity: Not on file  Stress: Not on file  Social Connections: Not on file     Family History: The patient's family history includes CAD in his cousin; Cancer in his brother; Coronary artery disease in his father. There is no history of Colon cancer, Pancreatic cancer, Rectal cancer, or Stomach cancer.  ROS:   Please see the history of present illness.    All other systems reviewed and are negative.  EKGs/Labs/Other Studies Reviewed:    The following studies were reviewed today: .Marland KitchenEKG Interpretation Date/Time:  Tuesday December 23 2022 14:48:55 EST Ventricular Rate:  74 PR Interval:  262 QRS Duration:  100 QT Interval:  390 QTC Calculation: 432 R Axis:   9  Text Interpretation: Sinus rhythm with 1st degree A-V block Incomplete right bundle branch block Inferior infarct (cited on or before 20-Jun-2015) Possible Anterior infarct (cited on or before 09-Feb-2014) When compared with ECG of 26-Nov-2021 13:12, Incomplete right bundle branch block is now Present Questionable change in initial forces of Anterior leads Confirmed by Belva Crome [phone]) on 12/23/2022 2:51:23 PM     Recent Labs: 01/15/2022: ALT 18; BUN 9; Creatinine, Ser  0.87; Potassium 4.8; Sodium 139  Recent Lipid Panel    Component Value Date/Time   CHOL 111 01/15/2022 1029   TRIG 69 01/15/2022 1029   HDL 49 01/15/2022 1029   CHOLHDL 2.3 01/15/2022 1029   LDLCALC 47 01/15/2022 1029    Physical Exam:    VS:  BP 122/62   Pulse 74   Ht 6' (1.829 m)   Wt (!) 329 lb 1.3 oz (149.3 kg)   SpO2 95%   BMI 44.63 kg/m     Wt Readings from Last 3 Encounters:  12/23/22 (!) 329 lb 1.3 oz (149.3 kg)  09/10/22 (!) 335 lb 1.3 oz (152 kg)  05/20/22 (!) 329 lb 0.6 oz (149.3 kg)     GEN: Patient is in no acute distress HEENT: Normal NECK: No JVD; No carotid bruits LYMPHATICS: No lymphadenopathy CARDIAC: Hear sounds regular,  2/6 systolic murmur at the apex. RESPIRATORY:  Clear to auscultation without rales, wheezing or rhonchi  ABDOMEN: Soft, non-tender, non-distended MUSCULOSKELETAL:  No edema; No deformity  SKIN: Warm and dry NEUROLOGIC:  Alert and oriented x 3 PSYCHIATRIC:  Normal affect   Signed, Garwin Brothers, MD  12/23/2022 2:47 PM    [facility]

## 2022-12-24 LAB — BASIC METABOLIC PANEL
BUN/Creatinine Ratio: 11 (ref 10–24)
BUN: 11 mg/dL (ref 8–27)
CO2: 23 mmol/L (ref 20–29)
Calcium: 9.1 mg/dL (ref 8.6–10.2)
Chloride: 103 mmol/L (ref 96–106)
Creatinine, Ser: 1 mg/dL (ref 0.76–1.27)
Glucose: 102 mg/dL — ABNORMAL HIGH (ref 70–99)
Potassium: 4.5 mmol/L (ref 3.5–5.2)
Sodium: 141 mmol/L (ref 134–144)
eGFR: 81 mL/min/{1.73_m2} (ref >59)

## 2022-12-24 LAB — CBC
Hematocrit: 42.4 % (ref 37.5–51.0)
Hemoglobin: 13.6 g/dL (ref 13.0–17.7)
MCH: 29.9 pg (ref 26.6–33.0)
MCHC: 32.1 g/dL (ref 31.5–35.7)
MCV: 93 fL (ref 79–97)
Platelets: 218 10*3/uL (ref 150–450)
RBC: 4.55 x10E6/uL (ref 4.14–5.80)
RDW: 12.6 % (ref 11.6–15.4)
WBC: 15.1 10*3/uL — ABNORMAL HIGH (ref 3.4–10.8)

## 2022-12-24 NOTE — Addendum Note (Signed)
Addended by: Eleonore Chiquito on: 12/24/2022 01:02 PM   Modules accepted: Orders

## 2022-12-25 ENCOUNTER — Telehealth: Payer: Self-pay | Admitting: Cardiology

## 2022-12-25 ENCOUNTER — Telehealth: Payer: Self-pay | Admitting: *Deleted

## 2022-12-25 DIAGNOSIS — E119 Type 2 diabetes mellitus without complications: Secondary | ICD-10-CM | POA: Diagnosis not present

## 2022-12-25 DIAGNOSIS — D72829 Elevated white blood cell count, unspecified: Secondary | ICD-10-CM | POA: Diagnosis not present

## 2022-12-25 NOTE — Telephone Encounter (Signed)
Cardiac Catheterization scheduled at [facility] for: Friday December 26, 2022 9 AM Arrival time [facility] Main Entrance A at: 7 AM  Nothing to eat after midnight prior to procedure, clear liquids until 5 AM day of procedure.  Medication instructions: -Hold:  If patient takes Semaglutide on Fridays, he should not take Friday morning 12/26/22. ( Medication list indicates he takes weekly on Mondays.)  -Usual morning medications can be taken with sips of water including aspirin 81 mg and Plavix 75 mg  Plan to go home the same day, you will only stay overnight if medically necessary.  You must have responsible adult to drive you home.  Someone must be with you the first 24 hours after you arrive home.  Left message for patient to call back to review procedure instructions.

## 2022-12-25 NOTE — Telephone Encounter (Signed)
Caller Diannia Ruder) stated patient will need instructions to prepare for procedure scheduled for tomorrow.  Caller is concerned patient takes Semaglutide,0.25 or 0.5MG /DOS, 2 MG/1.5ML SOPN and will need to hold this medication prior to surgery.

## 2022-12-25 NOTE — Telephone Encounter (Signed)
Advised that the cath has been rescheduled for 01/01/23.

## 2022-12-25 NOTE — Progress Notes (Signed)
Stephen Medina, from pharmacy call center, called to inform us that this pt has taken Semaglutide, last dose Mon. 12/9. I called Dr. Damian Leavell office and let them know. Phone call was returned letting me know that they did get the message and the cath has been rescheduled to 12/19.

## 2022-12-25 NOTE — Telephone Encounter (Addendum)
Patient reports Cardiac Cath has been rescheduled to 01/01/23, see other phone note from today. It is not necessary to hold semaglutide for 1 week prior to cardiac cath. I have confirmed with Harriett, RN at [facility] that it is not necessary to hold semaglutide for 1 week prior to cath.  If weekly semaglutide is the same day of the week as the cath, semaglutide should not be taken the morning of the cath.  See 12/23/22 CBC results and notes-will leave cardiac cath on schedule for 01/01/23.

## 2022-12-26 LAB — CBC WITH DIFFERENTIAL/PLATELET
Basophils Absolute: 0 10*3/uL (ref 0.0–0.2)
Basos: 1 %
EOS (ABSOLUTE): 0.2 10*3/uL (ref 0.0–0.4)
Eos: 3 %
Hematocrit: 43.6 % (ref 37.5–51.0)
Hemoglobin: 14.4 g/dL (ref 13.0–17.7)
Immature Grans (Abs): 0 10*3/uL (ref 0.0–0.1)
Immature Granulocytes: 0 %
Lymphocytes Absolute: 2.8 10*3/uL (ref 0.7–3.1)
Lymphs: 41 %
MCH: 30.3 pg (ref 26.6–33.0)
MCHC: 33 g/dL (ref 31.5–35.7)
MCV: 92 fL (ref 79–97)
Monocytes Absolute: 0.7 10*3/uL (ref 0.1–0.9)
Monocytes: 10 %
Neutrophils Absolute: 3.2 10*3/uL (ref 1.4–7.0)
Neutrophils: 45 %
Platelets: 252 10*3/uL (ref 150–450)
RBC: 4.76 x10E6/uL (ref 4.14–5.80)
RDW: 12.6 % (ref 11.6–15.4)
WBC: 6.9 10*3/uL (ref 3.4–10.8)

## 2022-12-30 ENCOUNTER — Telehealth: Payer: Self-pay | Admitting: *Deleted

## 2022-12-30 NOTE — Telephone Encounter (Signed)
Cardiac Catheterization scheduled at [facility] for: Thursday January 01, 2023 7:30 AM Arrival time [facility] Main Entrance A at: 5:30 AM  Nothing to eat after midnight prior to procedure, clear liquids until 5 AM day of procedure.  Medication instructions: -Usual morning medications can be taken with sips of water including aspirin 81 mg and Plavix 75 mg Pt reports semaglutide weekly-last dose was last week.  Plan to go home the same day, you will only stay overnight if medically necessary.  You must have responsible adult to drive you home.  Someone must be with you the first 24 hours after you arrive home.  Reviewed procedure instructions with patient.

## 2023-01-01 ENCOUNTER — Encounter (HOSPITAL_COMMUNITY)
Admission: RE | Disposition: A | Discharge status: Home / Self Care | Payer: Medicare Other | Source: Home / Self Care | Attending: Cardiovascular Disease

## 2023-01-01 ENCOUNTER — Other Ambulatory Visit: Payer: Self-pay

## 2023-01-01 ENCOUNTER — Ambulatory Visit (HOSPITAL_COMMUNITY)
Admission: RE | Admit: 2023-01-01 | Discharge: 2023-01-01 | Disposition: A | Discharge status: Home / Self Care | Payer: Medicare Other | Attending: Cardiovascular Disease | Admitting: Cardiovascular Disease

## 2023-01-01 DIAGNOSIS — Y832 Surgical operation with anastomosis, bypass or graft as the cause of abnormal reaction of the patient, or of later complication, without mention of misadventure at the time of the procedure: Secondary | ICD-10-CM | POA: Insufficient documentation

## 2023-01-01 DIAGNOSIS — I209 Angina pectoris, unspecified: Secondary | ICD-10-CM

## 2023-01-01 DIAGNOSIS — I2510 Atherosclerotic heart disease of native coronary artery without angina pectoris: Secondary | ICD-10-CM

## 2023-01-01 DIAGNOSIS — T82855A Stenosis of coronary artery stent, initial encounter: Secondary | ICD-10-CM | POA: Insufficient documentation

## 2023-01-01 DIAGNOSIS — I25118 Atherosclerotic heart disease of native coronary artery with other forms of angina pectoris: Secondary | ICD-10-CM | POA: Diagnosis not present

## 2023-01-01 DIAGNOSIS — I10 Essential (primary) hypertension: Secondary | ICD-10-CM | POA: Diagnosis not present

## 2023-01-01 DIAGNOSIS — E782 Mixed hyperlipidemia: Secondary | ICD-10-CM | POA: Diagnosis not present

## 2023-01-01 DIAGNOSIS — Z87891 Personal history of nicotine dependence: Secondary | ICD-10-CM | POA: Insufficient documentation

## 2023-01-01 DIAGNOSIS — Z955 Presence of coronary angioplasty implant and graft: Secondary | ICD-10-CM | POA: Diagnosis not present

## 2023-01-01 DIAGNOSIS — Z6841 Body Mass Index (BMI) 40.0 and over, adult: Secondary | ICD-10-CM | POA: Diagnosis not present

## 2023-01-01 DIAGNOSIS — I77819 Aortic ectasia, unspecified site: Secondary | ICD-10-CM | POA: Insufficient documentation

## 2023-01-01 DIAGNOSIS — I77810 Thoracic aortic ectasia: Secondary | ICD-10-CM

## 2023-01-01 HISTORY — PX: LEFT HEART CATH AND CORONARY ANGIOGRAPHY: CATH118249

## 2023-01-01 SURGERY — LEFT HEART CATH AND CORONARY ANGIOGRAPHY
Anesthesia: LOCAL

## 2023-01-01 MED ORDER — ASPIRIN 81 MG PO CHEW: 81.0000 mg | CHEWABLE_TABLET | ORAL | Status: DC

## 2023-01-01 MED ORDER — HEPARIN SODIUM (PORCINE) 1000 UNIT/ML IJ SOLN
INTRAMUSCULAR | Status: DC | PRN
  Administered 2023-01-01: 7000 [IU] via INTRAVENOUS

## 2023-01-01 MED ORDER — IOHEXOL 350 MG/ML SOLN
INTRAVENOUS | Status: DC | PRN
  Administered 2023-01-01: 45 mL

## 2023-01-01 MED ORDER — LIDOCAINE HCL (PF) 1 % IJ SOLN
INTRAMUSCULAR | Status: AC
  Filled 2023-01-01: qty 30

## 2023-01-01 MED ORDER — ACETAMINOPHEN 325 MG PO TABS: 650.0000 mg | ORAL_TABLET | ORAL | Status: DC | PRN

## 2023-01-01 MED ORDER — LABETALOL HCL 5 MG/ML IV SOLN: 10.0000 mg | INTRAVENOUS | Status: DC | PRN

## 2023-01-01 MED ORDER — MIDAZOLAM HCL 2 MG/2ML IJ SOLN
INTRAMUSCULAR | Status: AC
  Filled 2023-01-01: qty 2

## 2023-01-01 MED ORDER — SODIUM CHLORIDE 0.9% FLUSH: 3.0000 mL | INTRAVENOUS | Status: DC | PRN

## 2023-01-01 MED ORDER — FENTANYL CITRATE (PF) 100 MCG/2ML IJ SOLN
INTRAMUSCULAR | Status: AC
  Filled 2023-01-01: qty 2

## 2023-01-01 MED ORDER — HEPARIN SODIUM (PORCINE) 1000 UNIT/ML IJ SOLN
INTRAMUSCULAR | Status: AC
  Filled 2023-01-01: qty 10

## 2023-01-01 MED ORDER — FENTANYL CITRATE (PF) 100 MCG/2ML IJ SOLN
INTRAMUSCULAR | Status: DC | PRN
  Administered 2023-01-01: 50 ug via INTRAVENOUS

## 2023-01-01 MED ORDER — ONDANSETRON HCL 4 MG/2ML IJ SOLN
4.0000 mg | Freq: Four times a day (QID) | INTRAMUSCULAR | Status: DC | PRN
Start: 2023-01-01 — End: 2023-01-01

## 2023-01-01 MED ORDER — HEPARIN (PORCINE) IN NACL 1000-0.9 UT/500ML-% IV SOLN
INTRAVENOUS | Status: DC | PRN
  Administered 2023-01-01 (×2): 500 mL

## 2023-01-01 MED ORDER — MIDAZOLAM HCL 2 MG/2ML IJ SOLN
INTRAMUSCULAR | Status: DC | PRN
  Administered 2023-01-01: 2 mg via INTRAVENOUS

## 2023-01-01 MED ORDER — SODIUM CHLORIDE 0.9 % IV SOLN: 250.0000 mL | INTRAVENOUS | Status: DC | PRN

## 2023-01-01 MED ORDER — HYDRALAZINE HCL 20 MG/ML IJ SOLN: 10.0000 mg | INTRAMUSCULAR | Status: DC | PRN

## 2023-01-01 MED ORDER — SODIUM CHLORIDE 0.9 % WEIGHT BASED INFUSION: 1.0000 mL/kg/h | INTRAVENOUS | Status: DC

## 2023-01-01 MED ORDER — VERAPAMIL HCL 2.5 MG/ML IV SOLN
INTRAVENOUS | Status: AC
  Filled 2023-01-01: qty 2

## 2023-01-01 MED ORDER — SODIUM CHLORIDE 0.9 % IV SOLN: INTRAVENOUS | Status: DC

## 2023-01-01 MED ORDER — SODIUM CHLORIDE 0.9 % WEIGHT BASED INFUSION
3.0000 mL/kg/h | INTRAVENOUS | Status: AC
  Administered 2023-01-01: 3 mL/kg/h via INTRAVENOUS

## 2023-01-01 MED ORDER — SODIUM CHLORIDE 0.9% FLUSH: 3.0000 mL | Freq: Two times a day (BID) | INTRAVENOUS | Status: DC

## 2023-01-01 MED ORDER — LIDOCAINE HCL (PF) 1 % IJ SOLN
INTRAMUSCULAR | Status: DC | PRN
  Administered 2023-01-01: 2 mL via INTRADERMAL

## 2023-01-01 MED ORDER — VERAPAMIL HCL 2.5 MG/ML IV SOLN
INTRAVENOUS | Status: DC | PRN
  Administered 2023-01-01: 10 mL via INTRA_ARTERIAL

## 2023-01-01 SURGICAL SUPPLY — 8 items
CATH 5FR JL3.5 JR4 ANG PIG MP (CATHETERS) IMPLANT
DEVICE RAD COMP TR BAND LRG (VASCULAR PRODUCTS) IMPLANT
GLIDESHEATH SLEND SS 6F .021 (SHEATH) IMPLANT
GUIDEWIRE INQWIRE 1.5J.035X260 (WIRE) IMPLANT
INQWIRE 1.5J .035X260CM (WIRE) ×1
KIT SYRINGE INJ CVI SPIKEX1 (MISCELLANEOUS) IMPLANT
PACK CARDIAC CATHETERIZATION (CUSTOM PROCEDURE TRAY) ×1 IMPLANT
SET ATX-X65L (MISCELLANEOUS) IMPLANT

## 2023-01-01 NOTE — Progress Notes (Signed)
TR band removed at 0945, gauze dressing applied. Right radial level 0, clean, dry, and intact. Patient walked to the bathroom without difficulties.

## 2023-01-01 NOTE — Discharge Instructions (Signed)

## 2023-01-01 NOTE — Interval H&P Note (Signed)
History and Physical Interval Note:  01/01/2023 7:27 AM  Stephen Medina  has presented today for surgery, with the diagnosis of angina.  The various methods of treatment have been discussed with the patient and family. After consideration of risks, benefits and other options for treatment, the patient has consented to  Procedure(s): LEFT HEART CATH AND CORONARY ANGIOGRAPHY (N/A) as a surgical intervention.  The patient's history has been reviewed, patient examined, no change in status, stable for surgery.  I have reviewed the patient's chart and labs.  Questions were answered to the patient's satisfaction.    Cath Lab Visit (complete for each Cath Lab visit)  Clinical Evaluation Leading to the Procedure:   ACS: No.  Non-ACS:    Anginal Classification: CCS III  Anti-ischemic medical therapy: Minimal Therapy (1 class of medications)  Non-Invasive Test Results: No non-invasive testing performed  Prior CABG: No previous CABG        Verne Carrow

## 2023-01-02 ENCOUNTER — Encounter (HOSPITAL_COMMUNITY): Payer: Self-pay | Admitting: Cardiovascular Disease

## 2023-03-18 ENCOUNTER — Other Ambulatory Visit: Payer: Self-pay

## 2023-03-19 ENCOUNTER — Ambulatory Visit: Payer: Medicare Other | Attending: Cardiology | Admitting: Cardiology

## 2023-03-19 ENCOUNTER — Encounter: Payer: Self-pay | Admitting: Cardiology

## 2023-03-19 VITALS — BP 136/74 | HR 68 | Ht 72.0 in | Wt 334.0 lb

## 2023-03-19 DIAGNOSIS — M25562 Pain in left knee: Secondary | ICD-10-CM | POA: Diagnosis not present

## 2023-03-19 DIAGNOSIS — I10 Essential (primary) hypertension: Secondary | ICD-10-CM

## 2023-03-19 DIAGNOSIS — I77810 Thoracic aortic ectasia: Secondary | ICD-10-CM

## 2023-03-19 DIAGNOSIS — I2510 Atherosclerotic heart disease of native coronary artery without angina pectoris: Secondary | ICD-10-CM | POA: Diagnosis not present

## 2023-03-19 DIAGNOSIS — M25561 Pain in right knee: Secondary | ICD-10-CM | POA: Diagnosis not present

## 2023-03-19 NOTE — Patient Instructions (Signed)
 Medication Instructions:  Your physician recommends that you continue on your current medications as directed. Please refer to the Current Medication list given to you today.  *If you need a refill on your cardiac medications before your next appointment, please call your pharmacy*   Lab Work: None Ordered If you have labs (blood work) drawn today and your tests are completely normal, you will receive your results only by: MyChart Message (if you have MyChart) OR A paper copy in the mail If you have any lab test that is abnormal or we need to change your treatment, we will call you to review the results.   Testing/Procedures: None Ordered   Follow-Up: At [facility], you and your health needs are our priority.  As part of our continuing mission to provide you with exceptional heart care, we have created designated Provider Care Teams.  These Care Teams include your primary Cardiologist (physician) and Advanced Practice Providers (APPs -  Physician Assistants and Nurse Practitioners) who all work together to provide you with the care you need, when you need it.  We recommend signing up for the patient portal called "MyChart".  Sign up information is provided on this After Visit Summary.  MyChart is used to connect with patients for Virtual Visits (Telemedicine).  Patients are able to view lab/test results, encounter notes, upcoming appointments, etc.  Non-urgent messages can be sent to your provider as well.   To learn more about what you can do with MyChart, go to [email or web address].    Your next appointment:   6 month follow up

## 2023-03-19 NOTE — Progress Notes (Signed)
 Cardiology Office Note:    Date:  03/19/2023   ID:  [name], DOB 1950/10/15, MRN [id number]  PCP:  Assunta Found, MD  Cardiologist:  Garwin Brothers, MD   Referring MD: Assunta Found, MD    ASSESSMENT:    1. Coronary artery disease involving native coronary artery of native heart without angina pectoris   2. Ascending aorta dilatation (HCC)   3. Essential (primary) hypertension    PLAN:    In order of problems listed above:  Coronary artery disease: Stable at lower level.  No chest pain orthopnea PND.  He is comfortable and happy about it.  Second prevention stressed Essential hypertension: Blood pressure is stable and diet was emphasized. Mixed dyslipidemia: On lipid-lowering medications.  Lipids reviewed and discussed with him. Morbid obesity: Weight reduction stressed diet emphasized.  Risks of obesity were discussed at length Patient will be seen in follow-up appointment in 6 months or earlier if the patient has any concerns.    Medication Adjustments/Labs and Tests Ordered: Current medicines are reviewed at length with the patient today.  Concerns regarding medicines are outlined above.  No orders of the defined types were placed in this encounter.  No orders of the defined types were placed in this encounter.    No chief complaint on file.    History of Present Illness:    Stephen Medina is a 73 y.o. male.  Patient has past medical history of coronary disease, essential hypertension, mixed dyslipidemia and morbid..  Denies any takes care of activities of daily.  No chest pain orthopnea or PND.  He has issues with ambulation.  His last coronary angiography was on the table.  At the time of my evaluation, the patient is alert awake oriented and in no distress.  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/2018   Ascending aorta dilatation (HCC) 06/01/2019   Body mass index (BMI) 40.0-44.9, adult (HCC)  11/29/2019   CAD (coronary artery disease) 08/01/2013   Cath 2005  normal Left main, scattered irregularities LAD, 80% stenosis ostial Diag 1, scattered irregularities CFX, scattered irregularities RCA  Taxus stent March 2005 Diagonal    Disc displacement, lumbar    Disorder of ankle joint 10/20/2018   Displacement of lumbar intervertebral disc without myelopathy 08/23/2015   Essential (primary) hypertension 02/02/2014   Foot ulceration (HCC)    left   GERD (gastroesophageal reflux disease)    History of heart artery stent 05/14/2002   History of right hip replacement 04/14/2019   History of ulcer disease 10/19/2013   HNP (herniated nucleus pulposus), lumbar 09/03/2015   Hyperlipidemia    Hypertension    Inflammation of sacroiliac joint (HCC) 12/24/2017   Laryngopharyngeal reflux 01/21/2022   Lumbar spondylosis 04/01/2013   Lumbosacral spondylosis without myelopathy 01/18/2013   Mixed dyslipidemia 11/27/2017   Morbid obesity (HCC)    Neuromuscular disorder (HCC)    neuropathy left foot    Nonhealing ulcer of left lower extremity (HCC) 04/16/2015   OA (osteoarthritis) of hip 06/08/2019   Other spondylosis with radiculopathy, lumbar region 11/29/2019   Pain in right knee 05/28/2017   Preop cardiovascular exam 06/01/2019   Primary localized osteoarthrosis of ankle and foot 06/30/2018   Primary osteoarthritis of right hip 06/08/2019   Spinal stenosis of lumbar region 03/17/2013   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   Ulcer of left foot (HCC) 12/04/2014   Ulcer of toe of  left foot (HCC) 04/16/2015   Wears glasses     Past Surgical History:  Procedure Laterality Date   APPENDECTOMY     BACK SURGERY     BONE EXOSTOSIS EXCISION Left 01/28/2018   Procedure: Exostectomy left first metatarsal base and medial cuneiform;  Surgeon: Toni Arthurs, MD;  Location: [facility];  Service: Orthopedics;  Laterality: Left;    CARDIAC  CATHETERIZATION     05    1 stent   dr Donnie Aho   COLONOSCOPY     CORONARY STENT INTERVENTION N/A 11/26/2021   Procedure: CORONARY STENT INTERVENTION;  Surgeon: Corky Crafts, MD;  Location: [facility] INVASIVE CV LAB;  Service: Cardiovascular;  Laterality: N/A;   CORONARY ULTRASOUND/IVUS N/A 11/26/2021   Procedure: Intravascular Ultrasound/IVUS;  Surgeon: Corky Crafts, MD;  Location: [facility] INVASIVE CV LAB;  Service: Cardiovascular;  Laterality: N/A;   ESOPHAGOGASTRODUODENOSCOPY N/A 08/02/2013   Procedure: ESOPHAGOGASTRODUODENOSCOPY (EGD);  Surgeon: Meryl Dare, MD;  Location: [facility] ENDOSCOPY;  Service: Endoscopy;  Laterality: N/A;   FOOT ARTHRODESIS Left 05/22/2016   Procedure: Left Subtalar and Talonavicular Joint Arthrodesis;  Surgeon: Toni Arthurs, MD;  Location: [facility];  Service: Orthopedics;  Laterality: Left;   FOOT SURGERY     GASTROCNEMIUS RECESSION Left 05/22/2016   Procedure: Left Gastroc Recession;  Surgeon: Toni Arthurs, MD;  Location: [facility];  Service: Orthopedics;  Laterality: Left;   GRAFT APPLICATION N/A 06/22/2015   Procedure: INTEGRA GRAFT APPLICATION;  Surgeon: Vivi Barrack, DPM;  Location: [facility];  Service: Podiatry;  Laterality: N/A;   LEFT HEART CATH AND CORONARY ANGIOGRAPHY N/A 11/26/2021   Procedure: LEFT HEART CATH AND CORONARY ANGIOGRAPHY;  Surgeon: Corky Crafts, MD;  Location: [facility] INVASIVE CV LAB;  Service: Cardiovascular;  Laterality: N/A;   LEFT HEART CATH AND CORONARY ANGIOGRAPHY N/A 01/01/2023   Procedure: LEFT HEART CATH AND CORONARY ANGIOGRAPHY;  Surgeon: Kathleene Hazel, MD;  Location: [facility] INVASIVE CV LAB;  Service: Cardiovascular;  Laterality: N/A;   LEFT HEART CATHETERIZATION WITH CORONARY ANGIOGRAM N/A 02/09/2014   Procedure: LEFT HEART CATHETERIZATION WITH CORONARY ANGIOGRAM;  Surgeon: Peter M Swaziland, MD;  Location: [facility] CATH LAB;  Service: Cardiovascular;  Laterality: N/A;   LUMBAR  LAMINECTOMY     x2   LUMBAR LAMINECTOMY/DECOMPRESSION MICRODISCECTOMY Left 09/03/2015   Procedure: MICRODISCECTOMY L5-S1 LEFT;  Surgeon: Coletta Memos, MD;  Location: [facility] NEURO ORS;  Service: Neurosurgery;  Laterality: Left;   MICRODISCECTOMY L5-S1 LEFT   LUMBAR LAMINECTOMY/DECOMPRESSION MICRODISCECTOMY Left 02/13/2016   Procedure: MICRODISCECTOMY LUMBAR FIVE- SACRAL ONE LEFT;  Surgeon: Coletta Memos, MD;  Location: [facility] OR;  Service: Neurosurgery;  Laterality: Left;   TONSILLECTOMY     TOTAL HIP ARTHROPLASTY Right 06/08/2019   Procedure: TOTAL HIP ARTHROPLASTY ANTERIOR APPROACH;  Surgeon: Ollen Gross, MD;  Location: [facility] ORS;  Service: Orthopedics;  Laterality: Right;    WOUND DEBRIDEMENT N/A 06/22/2015   Procedure: DEBRIDEMENT WOUND;  Surgeon: Vivi Barrack, DPM;  Location: [facility];  Service: Podiatry;  Laterality: N/A;    Current Medications: No outpatient medications have been marked as taking for the 03/19/23 encounter (Office Visit) with Sargun Rummell, Aundra Dubin, MD.     Allergies:   Silicone, Adhesive [tape], Latex, and Other   Social History   Socioeconomic History   Marital status: Married    Spouse name: Not on file   Number of children: Not on file   Years of education: Not on file  Highest education level: Not on file  Occupational History   Not on file  Tobacco Use   Smoking status: Never   Smokeless tobacco: Former    Types: Designer, multimedia Use   Vaping status: Never Used  Substance and Sexual Activity   Alcohol use: No    Alcohol/week: 1.0 standard drink of alcohol    Types: 1 Glasses of wine per week   Drug use: No   Sexual activity: Not Currently  Other Topics Concern   Not on file  Social History Narrative   Pt lives in [place] with his wife. They have one son who does not live in the area. Pt previously worked for [employer] but is retired. He used to [employer]. He has never smoke and drinks 4-5 alcoholic beverages per week.  No illicit drug use.   Social Drivers of Corporate investment banker Strain: Not on file  Food Insecurity: Not on file  Transportation Needs: Not on file  Physical Activity: Not on file  Stress: Not on file  Social Connections: Not on file     Family History: The patient's family history includes CAD in his cousin; Cancer in his brother; Coronary artery disease in his father. There is no history of Colon cancer, Pancreatic cancer, Rectal cancer, or Stomach cancer.  ROS:   Please see the history of present illness.    All other systems reviewed and are negative.  EKGs/Labs/Other Studies Reviewed:    The following studies were reviewed today: I discussed my findings with the patient at length   Recent Labs: 12/23/2022: BUN 11; Creatinine, Ser 1.00; Potassium 4.5; Sodium 141 12/25/2022: Hemoglobin 14.4; Platelets 252  Recent Lipid Panel    Component Value Date/Time   CHOL 111 01/15/2022 1029   TRIG 69 01/15/2022 1029   HDL 49 01/15/2022 1029   CHOLHDL 2.3 01/15/2022 1029   LDLCALC 47 01/15/2022 1029    Physical Exam:    VS:  BP 136/74   Pulse 68   Ht 6' (1.829 m)   Wt (!) 334 lb (151.5 kg)   SpO2 96%   BMI 45.30 kg/m     Wt Readings from Last 3 Encounters:  03/19/23 (!) 334 lb (151.5 kg)  01/01/23 (!) 324 lb (147 kg)  12/23/22 (!) 329 lb 1.3 oz (149.3 kg)     GEN: Patient is in no acute distress HEENT: Normal NECK: No JVD; No carotid bruits LYMPHATICS: No lymphadenopathy CARDIAC: Hear sounds regular, 2/6 systolic murmur at the apex. RESPIRATORY:  Clear to auscultation without rales, wheezing or rhonchi  ABDOMEN: Soft, non-tender, non-distended MUSCULOSKELETAL:  No edema; No deformity  SKIN: Warm and dry NEUROLOGIC:  Alert and oriented x 3 PSYCHIATRIC:  Normal affect   Signed, Garwin Brothers, MD  03/19/2023 3:18 PM    [facility]

## 2023-06-03 DIAGNOSIS — L570 Actinic keratosis: Secondary | ICD-10-CM | POA: Diagnosis not present

## 2023-06-03 DIAGNOSIS — I781 Nevus, non-neoplastic: Secondary | ICD-10-CM | POA: Diagnosis not present

## 2023-06-26 DIAGNOSIS — M170 Bilateral primary osteoarthritis of knee: Secondary | ICD-10-CM | POA: Diagnosis not present

## 2023-09-10 ENCOUNTER — Other Ambulatory Visit: Payer: Self-pay | Admitting: Cardiology

## 2023-09-24 DIAGNOSIS — Z0001 Encounter for general adult medical examination with abnormal findings: Secondary | ICD-10-CM | POA: Diagnosis not present

## 2023-09-24 DIAGNOSIS — I10 Essential (primary) hypertension: Secondary | ICD-10-CM | POA: Diagnosis not present

## 2023-09-24 DIAGNOSIS — M159 Polyosteoarthritis, unspecified: Secondary | ICD-10-CM | POA: Diagnosis not present

## 2023-09-24 DIAGNOSIS — G709 Myoneural disorder, unspecified: Secondary | ICD-10-CM | POA: Diagnosis not present

## 2023-09-24 DIAGNOSIS — E119 Type 2 diabetes mellitus without complications: Secondary | ICD-10-CM | POA: Diagnosis not present

## 2023-09-24 DIAGNOSIS — I712 Thoracic aortic aneurysm, without rupture, unspecified: Secondary | ICD-10-CM | POA: Diagnosis not present

## 2023-09-24 DIAGNOSIS — I2510 Atherosclerotic heart disease of native coronary artery without angina pectoris: Secondary | ICD-10-CM | POA: Diagnosis not present

## 2023-09-25 ENCOUNTER — Encounter (INDEPENDENT_AMBULATORY_CARE_PROVIDER_SITE_OTHER): Payer: Medicare Other | Admitting: Ophthalmology

## 2023-09-25 DIAGNOSIS — D3131 Benign neoplasm of right choroid: Secondary | ICD-10-CM

## 2023-09-25 DIAGNOSIS — D3132 Benign neoplasm of left choroid: Secondary | ICD-10-CM | POA: Diagnosis not present

## 2023-09-25 DIAGNOSIS — H43813 Vitreous degeneration, bilateral: Secondary | ICD-10-CM

## 2023-09-25 DIAGNOSIS — I10 Essential (primary) hypertension: Secondary | ICD-10-CM | POA: Diagnosis not present

## 2023-09-25 DIAGNOSIS — H2513 Age-related nuclear cataract, bilateral: Secondary | ICD-10-CM

## 2023-09-25 DIAGNOSIS — H35033 Hypertensive retinopathy, bilateral: Secondary | ICD-10-CM | POA: Diagnosis not present

## 2023-10-13 DIAGNOSIS — H43811 Vitreous degeneration, right eye: Secondary | ICD-10-CM | POA: Diagnosis not present

## 2023-10-13 DIAGNOSIS — H524 Presbyopia: Secondary | ICD-10-CM | POA: Diagnosis not present

## 2023-12-22 ENCOUNTER — Encounter: Payer: Self-pay | Admitting: Gastroenterology

## 2024-01-29 ENCOUNTER — Ambulatory Visit: Payer: Self-pay | Admitting: Gastroenterology

## 2024-01-29 ENCOUNTER — Encounter: Payer: Self-pay | Admitting: Gastroenterology

## 2024-01-29 ENCOUNTER — Other Ambulatory Visit

## 2024-01-29 ENCOUNTER — Telehealth: Payer: Self-pay | Admitting: Emergency Medicine

## 2024-01-29 ENCOUNTER — Ambulatory Visit: Admitting: Gastroenterology

## 2024-01-29 VITALS — BP 110/70 | HR 80 | Ht 68.0 in | Wt 308.0 lb

## 2024-01-29 DIAGNOSIS — K219 Gastro-esophageal reflux disease without esophagitis: Secondary | ICD-10-CM | POA: Diagnosis not present

## 2024-01-29 DIAGNOSIS — Z7901 Long term (current) use of anticoagulants: Secondary | ICD-10-CM

## 2024-01-29 DIAGNOSIS — Z8719 Personal history of other diseases of the digestive system: Secondary | ICD-10-CM | POA: Diagnosis not present

## 2024-01-29 DIAGNOSIS — R1319 Other dysphagia: Secondary | ICD-10-CM | POA: Diagnosis not present

## 2024-01-29 DIAGNOSIS — Z800 Family history of malignant neoplasm of digestive organs: Secondary | ICD-10-CM | POA: Diagnosis not present

## 2024-01-29 DIAGNOSIS — I77810 Thoracic aortic ectasia: Secondary | ICD-10-CM

## 2024-01-29 DIAGNOSIS — I2510 Atherosclerotic heart disease of native coronary artery without angina pectoris: Secondary | ICD-10-CM

## 2024-01-29 LAB — BASIC METABOLIC PANEL WITH GFR
BUN: 11 mg/dL (ref 6–23)
CO2: 26 meq/L (ref 19–32)
Calcium: 9 mg/dL (ref 8.4–10.5)
Chloride: 104 meq/L (ref 96–112)
Creatinine, Ser: 0.82 mg/dL (ref 0.40–1.50)
GFR: 88.25 mL/min
Glucose, Bld: 95 mg/dL (ref 70–99)
Potassium: 3.9 meq/L (ref 3.5–5.1)
Sodium: 138 meq/L (ref 135–145)

## 2024-01-29 LAB — CBC WITH DIFFERENTIAL/PLATELET
Basophils Absolute: 0.1 K/uL (ref 0.0–0.1)
Basophils Relative: 0.7 % (ref 0.0–3.0)
Eosinophils Absolute: 0.1 K/uL (ref 0.0–0.7)
Eosinophils Relative: 1.6 % (ref 0.0–5.0)
HCT: 43.3 % (ref 39.0–52.0)
Hemoglobin: 14.9 g/dL (ref 13.0–17.0)
Lymphocytes Relative: 33.9 % (ref 12.0–46.0)
Lymphs Abs: 2.9 K/uL (ref 0.7–4.0)
MCHC: 34.5 g/dL (ref 30.0–36.0)
MCV: 89.6 fl (ref 78.0–100.0)
Monocytes Absolute: 0.8 K/uL (ref 0.1–1.0)
Monocytes Relative: 9.1 % (ref 3.0–12.0)
Neutro Abs: 4.7 K/uL (ref 1.4–7.7)
Neutrophils Relative %: 54.7 % (ref 43.0–77.0)
Platelets: 227 K/uL (ref 150.0–400.0)
RBC: 4.83 Mil/uL (ref 4.22–5.81)
RDW: 14.2 % (ref 11.5–15.5)
WBC: 8.7 K/uL (ref 4.0–10.5)

## 2024-01-29 MED ORDER — PANTOPRAZOLE SODIUM 40 MG PO TBEC: 40.0000 mg | DELAYED_RELEASE_TABLET | Freq: Two times a day (BID) | ORAL | 0 refills | Status: AC

## 2024-01-29 NOTE — Telephone Encounter (Signed)
 [facility] Pre-operative Risk Assessment     Request for surgical clearance:     Endoscopy Procedure  What type of surgery is being performed?     Endoscopy  When is this surgery scheduled?     02/25/2024  What type of clearance is required ?   Pharmacy  Are there any medications that need to be held prior to surgery and how long? Plavix  (5 day hold)  Practice name and name of physician performing surgery?      [facility]  What is your office phone and fax number?      Phone- [phone]  Fax- [phone]  Anesthesia type (None, local, MAC, general) ?       MAC   Please route your response to Sol Englert, CMA

## 2024-01-29 NOTE — Patient Instructions (Addendum)
 You have been scheduled for an endoscopy. Please follow written instructions given to you at your visit today.  If you use inhalers (even only as needed), please bring them with you on the day of your procedure.  If you take any of the following medications, they will need to be adjusted prior to your procedure:   DO NOT TAKE 7 DAYS PRIOR TO TEST- Trulicity (dulaglutide) Ozempic, Wegovy (semaglutide) Mounjaro, Zepbound (tirzepatide) Bydureon Bcise (exanatide extended release)  DO NOT TAKE 1 DAY PRIOR TO YOUR TEST Rybelsus (semaglutide) Adlyxin (lixisenatide) Victoza (liraglutide) Byetta (exanatide) ___________________________________________________________________________  Please go to the lab in the basement of our building to have lab work done as you leave today. Hit B for basement when you get on the elevator.  When the doors open the lab is on your left.  We will call you with the results. Thank you.  We have sent the following medications to your pharmacy for you to pick up at your convenience: Protonix  40 mg  STOP OVER THE COUNTER RELUX MEDICATIONS  _______________________________________________________  If your blood pressure at your visit was 140/90 or greater, please contact your primary care physician to follow up on this.  _______________________________________________________  If you are age 68 or older, your body mass index should be between 23-30. Your Body mass index is 46.83 kg/m. If this is out of the aforementioned range listed, please consider follow up with your Primary Care Provider.  If you are age 73 or younger, your body mass index should be between 19-25. Your Body mass index is 46.83 kg/m. If this is out of the aformentioned range listed, please consider follow up with your Primary Care Provider.   ________________________________________________________  The [facility] providers would like to encourage you to use MYCHART to communicate with  providers for non-urgent requests or questions.  Due to long hold times on the telephone, sending your provider a message by [facility] may be a faster and more efficient way to get a response.  Please allow 48 business hours for a response.  Please remember that this is for non-urgent requests.  _______________________________________________________  [facility] is using a team-based approach to care.  Your team is made up of your doctor and two to three APPS. Our APPS (Nurse Practitioners and Physician Assistants) work with your physician to ensure care continuity for you. They are fully qualified to address your health concerns and develop a treatment plan. They communicate directly with your gastroenterologist to care for you. Seeing the Advanced Practice Practitioners on your physician's team can help you by facilitating care more promptly, often allowing for earlier appointments, access to diagnostic testing, procedures, and other specialty referrals.   Due to recent changes in healthcare laws, you may see the results of your imaging and laboratory studies on MyChart before your provider has had a chance to review them.  We understand that in some cases there may be results that are confusing or concerning to you. Not all laboratory results come back in the same time frame and the provider may be waiting for multiple results in order to interpret others.  Please give us  48 hours in order for your provider to thoroughly review all the results before contacting the office for clarification of your results.

## 2024-01-29 NOTE — Telephone Encounter (Addendum)
"  ° °  Patient Name: Stephen Medina  DOB: 09-09-1950 MRN: [id number]  Primary Cardiologist: Jennifer JONELLE Crape, MD  Chart reviewed as part of pre-operative protocol coverage. Patient has an endoscopy planned for 02/25/2024, and we were asked to give our recommendations for holding Plavix .   Per our office's pre-op protocol, okay to hold Plavix  for 5 days prior to procedure. Please continue Aspirin  perioperatively. Please resume Plavix  as soon as safely possible.  I will route this recommendation to the requesting party and remove from pre-op pool.  Please call with questions.  Ritter Helsley E Muzammil Bruins, PA-C 01/29/2024, 4:27 PM  "

## 2024-01-29 NOTE — Progress Notes (Signed)
 Stephen Medina [id number] [birth year]   Chief Complaint: Heartburn, regurgitation  Referring Provider: Marvine Rush, MD Primary GI MD: Sampson (previous Dr. Aneita)  HPI: Stephen Medina is a 74 y.o. male with past medical history of obesity, CAD s/p stenting and on chronic anticoagulation, ascending aorta dilatation, HTN, HLD, GERD, LPR, morbid obesity, appendectomy who presents today for a complaint of GERD and belching.    Patient last seen in office 12/13/2019 by Dr. Aneita for epigastric pain, dyspepsia, dysphagia, occult blood in stool.  History of gastric ulcers with bleed in 2015.  He was scheduled for EGD with possible dilation, pantoprazole  increased to 40 mg twice daily.  Had previously been having some diarrhea and left lower quadrant pain which resolved with treatment of Campylobacter.  Underwent EGD 01/24/2020 with finding of benign-appearing esophageal stenosis which was dilated, erythematous mucosa in the gastric body and antrum which was biopsied, small hiatal hernia.  Biopsies benign and negative for H. pylori.  Last visit with cardiology 03/19/2023, stable at that time and advised to follow-up in 6 months.   Discussed the use of AI scribe software for clinical note transcription with the patient, who gave verbal consent to proceed.  History of Present Illness Stephen Medina is a 74 year old male with gastroesophageal reflux disease and esophageal stricture who presents for evaluation of recurrent reflux and dysphagia.  Dysphagia and Esophageal Stricture: - Longstanding dysphagia, previously improved after upper endoscopy and dilation for esophageal narrowing in 2022 - Gradual recurrence of dysphagia, now more prominent than immediately post-dilation - Occasional episodes of food impaction, rarely requiring regurgitation  Gastroesophageal Reflux Symptoms: - Ongoing reflux symptoms, not closely monitored daily - Previously managed with Protonix , which provided  superior symptom control compared to over-the-counter Prilosec - Currently taking Prilosec, typically one tablet daily and occasionally two, but finds it less effective than Protonix   Associated Gastrointestinal and Systemic Symptoms: - No abdominal pain, nausea, vomiting, fever, constipation, diarrhea, hematochezia, or melena - Regular bowel movements  Cardiopulmonary Symptoms: - No chest pain, shortness of breath, or leg swelling  Relevant Medical and Surgical History: - Current medications include Plavix  and weekly semaglutide injections - Past surgical history includes hip replacement - Chronic knee pain    Patient reports his brother had esophageal cancer in his 9s and died within a year from the disease.  Previous GI Procedures/Imaging   EGD 01/24/2020 - Benign-appearing esophageal stenosis. Dilated.  - Erythematous mucosa in the gastric body and antrum. Biopsied.  - Small hiatal hernia.  - Normal duodenal bulb and second portion of the duodenum. Path: Surgical [P], stomach, gastric erythema bx - GASTRIC ANTRAL MUCOSA WITH NO SPECIFIC HISTOPATHOLOGIC CHANGES - GASTRIC OXYNTIC MUCOSA WITH PARIETAL CELL HYPERPLASIA AS CAN BE SEEN IN HYPERGASTRINEMIC STATES SUCH AS PPI THERAPY. - WARTHIN STARRY STAIN IS NEGATIVE FOR HELICOBACTER PYLORI   Colonoscopy 04/27/2017 - Internal hemorrhoids.  - The examination was otherwise normal on direct and retroflexion views.  - No specimens collected. - Recall 10 years  Past Medical History:  Diagnosis Date   Angina pectoris 11/21/2021   Ankle arthritis 10/20/2018   Ascending aorta dilatation 06/01/2019   Body mass index (BMI) 40.0-44.9, adult (HCC) 11/29/2019   CAD (coronary artery disease) 08/01/2013   Cath 2005  normal Left main, scattered irregularities LAD, 80% stenosis ostial Diag 1, scattered irregularities CFX, scattered irregularities RCA  Taxus stent March 2005 Diagonal    Disc displacement, lumbar    Disorder of ankle joint  10/20/2018  Displacement of lumbar intervertebral disc without myelopathy 08/23/2015   Essential (primary) hypertension 02/02/2014   Foot ulceration (HCC)    left   GERD (gastroesophageal reflux disease)    History of heart artery stent 05/14/2002   History of right hip replacement 04/14/2019   History of ulcer disease 10/19/2013   HNP (herniated nucleus pulposus), lumbar 09/03/2015   Hyperlipidemia    Hypertension    Inflammation of sacroiliac joint 12/24/2017   Laryngopharyngeal reflux 01/21/2022   Lumbar spondylosis 04/01/2013   Lumbosacral spondylosis without myelopathy 01/18/2013   Mixed dyslipidemia 11/27/2017   Morbid obesity (HCC)    Neuromuscular disorder (HCC)    neuropathy left foot    Nonhealing ulcer of left lower extremity (HCC) 04/16/2015   OA (osteoarthritis) of hip 06/08/2019   Other spondylosis with radiculopathy, lumbar region 11/29/2019   Pain in right knee 05/28/2017   Preop cardiovascular exam 06/01/2019   Primary localized osteoarthrosis of ankle and foot 06/30/2018   Primary osteoarthritis of right hip 06/08/2019   Spinal stenosis of lumbar region 03/17/2013   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   Ulcer of left foot (HCC) 12/04/2014   Ulcer of toe of left foot (HCC) 04/16/2015   Wears glasses     Past Surgical History:  Procedure Laterality Date   APPENDECTOMY     BACK SURGERY     BONE EXOSTOSIS EXCISION Left 01/28/2018   Procedure: Exostectomy left first metatarsal base and medial cuneiform;  Surgeon: Kit Rush, MD;  Location: [facility];  Service: Orthopedics;  Laterality: Left;    CARDIAC CATHETERIZATION     05    1 stent   dr blanca   COLONOSCOPY     CORONARY STENT INTERVENTION N/A 11/26/2021   Procedure: CORONARY STENT INTERVENTION;  Surgeon: Dann Candyce RAMAN, MD;  Location[facility] INVASIVE CV LAB;  Service: Cardiovascular;  Laterality: N/A;   CORONARY ULTRASOUND/IVUS N/A 11/26/2021    Procedure: Intravascular Ultrasound/IVUS;  Surgeon: Dann Candyce RAMAN, MD;  Location: [facility] INVASIVE CV LAB;  Service: Cardiovascular;  Laterality: N/A;   ESOPHAGOGASTRODUODENOSCOPY N/A 08/02/2013   Procedure: ESOPHAGOGASTRODUODENOSCOPY (EGD);  Surgeon: Gwendlyn ONEIDA Buddy, MD;  Location: [facility] ENDOSCOPY;  Service: Endoscopy;  Laterality: N/A;   FOOT ARTHRODESIS Left 05/22/2016   Procedure: Left Subtalar and Talonavicular Joint Arthrodesis;  Surgeon: Kit Rush, MD;  Location: [facility];  Service: Orthopedics;  Laterality: Left;   FOOT SURGERY     GASTROCNEMIUS RECESSION Left 05/22/2016   Procedure: Left Gastroc Recession;  Surgeon: Kit Rush, MD;  Location: [facility];  Service: Orthopedics;  Laterality: Left;   GRAFT APPLICATION N/A 06/22/2015   Procedure: [employer]TE[employer] GRAFT APPLICATION;  Surgeon: Donnice JONELLE Fees, DPM;  Location: [facility];  Service: Podiatry;  Laterality: N/A;   LEFT HEART CATH AND CORONARY ANGIOGRAPHY N/A 11/26/2021   Procedure: LEFT HEART CATH AND CORONARY ANGIOGRAPHY;  Surgeon: Dann Candyce RAMAN, MD;  Location: [facility] INVASIVE CV LAB;  Service: Cardiovascular;  Laterality: N/A;   LEFT HEART CATH AND CORONARY ANGIOGRAPHY N/A 01/01/2023   Procedure: LEFT HEART CATH AND CORONARY ANGIOGRAPHY;  Surgeon: Verlin Lonni BIRCH, MD;  Location: [facility] INVASIVE CV LAB;  Service: Cardiovascular;  Laterality: N/A;   LEFT HEART CATHETERIZATION WITH CORONARY ANGIOGRAM N/A 02/09/2014   Procedure: LEFT HEART CATHETERIZATION WITH CORONARY ANGIOGRAM;  Surgeon: Peter M Jordan, MD;  Location: [facility] CATH LAB;  Service: Cardiovascular;  Laterality: N/A;   LUMBAR LAMINECTOMY     x2  LUMBAR LAMINECTOMY/DECOMPRESSION MICRODISCECTOMY Left 09/03/2015   Procedure: MICRODISCECTOMY L5-S1 LEFT;  Surgeon: Rockey Peru, MD;  Location: [facility] NEURO ORS;  Service: Neurosurgery;  Laterality: Left;   MICRODISCECTOMY L5-S1 LEFT   LUMBAR LAMINECTOMY/DECOMPRESSION MICRODISCECTOMY Left  02/13/2016   Procedure: MICRODISCECTOMY LUMBAR FIVE- SACRAL ONE LEFT;  Surgeon: Rockey Peru, MD;  Location: [facility] OR;  Service: Neurosurgery;  Laterality: Left;   TONSILLECTOMY     TOTAL HIP ARTHROPLASTY Right 06/08/2019   Procedure: TOTAL HIP ARTHROPLASTY ANTERIOR APPROACH;  Surgeon: Melodi Lerner, MD;  Location: [facility] ORS;  Service: Orthopedics;  Laterality: Right;    WOUND DEBRIDEMENT N/A 06/22/2015   Procedure: DEBRIDEMENT WOUND;  Surgeon: Donnice JONELLE Fees, DPM;  Location: [facility];  Service: Podiatry;  Laterality: N/A;    Current Outpatient Medications  Medication Sig Dispense Refill   aspirin  EC 81 MG tablet Take 1 tablet (81 mg total) by mouth daily. Swallow whole. 90 tablet 3   clopidogrel  (PLAVIX ) 75 MG tablet TAKE 1 TABLET BY MOUTH EVERY DAY 90 tablet 1   ezetimibe  (ZETIA ) 10 MG tablet Take 1 tablet (10 mg total) by mouth daily. 90 tablet 1   levocetirizine (XYZAL) 5 MG tablet Take 5 mg by mouth daily.     meloxicam (MOBIC) 15 MG tablet Take 15 mg by mouth daily.      metoprolol  succinate (TOPROL -XL) 25 MG 24 hr tablet TAKE 1 TABLET BY MOUTH EVERY DAY 90 tablet 1   nitroGLYCERIN  (NITROSTAT ) 0.4 MG SL tablet Place 1 tablet (0.4 mg total) under the tongue every 5 (five) minutes as needed for chest pain. 25 tablet 8   rosuvastatin  (CRESTOR ) 40 MG tablet Take 0.5 tablets (20 mg total) by mouth daily. 45 tablet 3   Semaglutide,0.25 or 0.5MG /DOS, 2 MG/1.5ML SOPN Inject 0.25 mg into the skin every Monday.     zinc  gluconate 50 MG tablet Take 100 mg by mouth daily.     No current facility-administered medications for this visit.    Allergies as of 01/29/2024 - Review Complete 01/29/2024  Allergen Reaction Noted   Silicone  02/21/2019   Adhesive [tape] Other (See Comments) 02/02/2013   Latex  03/09/2020   Other Other (See Comments) 02/21/2019    Family History  Problem Relation Age of Onset   Coronary artery disease Father        open heart surgery     Hypertension Father    Esophageal cancer Brother    Heart disease Brother    CAD Cousin        requiring stent   Colon cancer Neg Hx    Pancreatic cancer Neg Hx    Rectal cancer Neg Hx    Stomach cancer Neg Hx     Social History[1]   Review of Systems:    Constitutional: No weight loss, fever, chills Cardiovascular: No chest pain Respiratory: No SOB  Gastrointestinal: See HPI and otherwise negative   Physical Exam:  Vital signs: BP 110/70 (BP Location: Left Arm, Patient Position: Sitting, Cuff Size: Large)   Pulse 80   Ht 5' 8 (1.727 m) Comment: height measured without shoes  Wt (!) 308 lb (139.7 kg)   BMI 46.83 kg/m   Wt Readings from Last 3 Encounters:  01/29/24 (!) 308 lb (139.7 kg)  03/19/23 (!) 334 lb (151.5 kg)  01/01/23 (!) 324 lb (147 kg)     Constitutional: Pleasant, morbidly obese male in NAD, alert and cooperative, ambulates with cane Head:  Normocephalic and atraumatic.  Respiratory: Respirations  even and unlabored. Lungs clear to auscultation bilaterally.  No wheezes, crackles, or rhonchi.  Cardiovascular:  Regular rate and rhythm. No murmurs. No peripheral edema. Gastrointestinal:  Soft, nondistended, nontender. No rebound or guarding. Normal bowel sounds. No appreciable masses or hepatomegaly. Rectal:  Not performed.  Neurologic:  Alert and oriented x4;  grossly normal neurologically.  Skin:   Dry and intact without significant lesions or rashes. Psychiatric: Oriented to person, place and time. Demonstrates good judgement and reason without abnormal affect or behaviors.   Echocardiogram 06/03/2019 1. TDS . Left ventricular ejection fraction, by estimation, is 55 to 60% . The left ventricle has normal function. The left ventricle has no regional wall motion abnormalities. Left ventricular diastolic parameters are consistent with Grade I diastolic dysfunction ( impaired relaxation) .  2. Right ventricular systolic function is normal. The right ventricular  size is mildly enlarged.  3. Left atrial size was mildly dilated.  4. The mitral valve is normal in structure. No evidence of mitral valve regurgitation. No evidence of mitral stenosis.  5. The aortic valve is normal in structure. Aortic valve regurgitation is not visualized. No aortic stenosis is present.  6. The inferior vena cava is normal in size with greater than 50% respiratory variability, suggesting right atrial pressure of 3 mmHg.   Assessment/Plan:   Assessment & Plan Gastroesophageal reflux disease History of esophageal stricture s/p dilation 2022 Esophageal dysphagia Family history of esophageal cancer - brother, 1s Chronic GERD with history of esophageal stricture s/p dilation in 2022.  Having recurrent dysphagia to solids requiring regurgitation intermittently.  May need repeat dilation.  Does also have family history of esophageal cancer in his brother who is diagnosed in his 74s and died within a year from the disease.   GERD symptoms were previously controlled on Protonix  40 mg twice daily, but he has been without a prescription for a while and has been using an OTC PPI with minimal improvement in symptoms.  - Restarted pantoprazole  40 mg twice daily.  Discussed reducing dose to once daily after endoscopy pending findings and response to treatment. - Schedule upper endoscopy with possible dilation. I thoroughly discussed the procedure with the patient to include nature of the procedure, alternatives, benefits, and risks (including but not limited to bleeding, infection, perforation, anesthesia/cardiac/pulmonary complications). Patient verbalized understanding and gave verbal consent to proceed with procedure.  - Request cardiac clearance and Plavix  hold - Instructed to skip semaglutide injection the week of procedure. - Update CBC, BMP today  CAD  Ascending aortic dilatation Chronic anticoagulation   Camie Furbish, PA-C [facility] 01/29/2024, 3:26  PM  Patient Care Team: Marvine Rush, MD as PCP - General (Family Medicine) Revankar, Jennifer SAUNDERS, MD as PCP - Cardiology (Cardiology) Blanca Elsie RAMAN, MD as Consulting Physician (Cardiology)       [1]  Social History Tobacco Use   Smoking status: Never   Smokeless tobacco: Former    Types: Engineer, Drilling   Vaping status: Never Used  Substance Use Topics   Alcohol use: No    Alcohol/week: 1.0 standard drink of alcohol    Types: 1 Glasses of wine per week   Drug use: No   "

## 2024-02-01 NOTE — Telephone Encounter (Signed)
 Called and spoke with patient. Instructions were given in regards to holding his Plavix , per cardiologists recommendations. Patient expressed understanding with instructions.

## 2024-02-03 NOTE — Progress Notes (Signed)
 ____________________________________________________________  Attending physician addendum:  Thank you for sending this case to me. I have reviewed the entire note and agree with the plan.   Victory Brand, MD  ____________________________________________________________

## 2024-02-16 ENCOUNTER — Encounter: Admitting: Gastroenterology

## 2024-02-17 ENCOUNTER — Encounter: Payer: Self-pay | Admitting: Gastroenterology

## 2024-02-25 ENCOUNTER — Encounter: Admitting: Gastroenterology

## 2024-09-28 ENCOUNTER — Encounter (INDEPENDENT_AMBULATORY_CARE_PROVIDER_SITE_OTHER): Admitting: Ophthalmology
